# Patient Record
Sex: Female | Born: 1935 | Race: White | Hispanic: No | Smoking: Former smoker
Health system: Southern US, Community
[De-identification: ages and names within clinical notes are randomized; demographics above are authoritative.]

## PROBLEM LIST (undated history)

## (undated) DIAGNOSIS — I10 Essential (primary) hypertension: Secondary | ICD-10-CM

## (undated) DIAGNOSIS — R7303 Prediabetes: Secondary | ICD-10-CM

## (undated) DIAGNOSIS — Z8619 Personal history of other infectious and parasitic diseases: Secondary | ICD-10-CM

## (undated) DIAGNOSIS — I639 Cerebral infarction, unspecified: Secondary | ICD-10-CM

## (undated) DIAGNOSIS — E785 Hyperlipidemia, unspecified: Secondary | ICD-10-CM

## (undated) DIAGNOSIS — M1990 Unspecified osteoarthritis, unspecified site: Secondary | ICD-10-CM

## (undated) DIAGNOSIS — I4891 Unspecified atrial fibrillation: Secondary | ICD-10-CM

## (undated) DIAGNOSIS — I499 Cardiac arrhythmia, unspecified: Secondary | ICD-10-CM

## (undated) DIAGNOSIS — C801 Malignant (primary) neoplasm, unspecified: Secondary | ICD-10-CM

## (undated) HISTORY — DX: Hyperlipidemia, unspecified: E78.5

## (undated) HISTORY — PX: CATARACT EXTRACTION: SUR2

## (undated) HISTORY — DX: Personal history of other infectious and parasitic diseases: Z86.19

## (undated) HISTORY — DX: Unspecified atrial fibrillation: I48.91

## (undated) HISTORY — DX: Essential (primary) hypertension: I10

## (undated) HISTORY — DX: Cerebral infarction, unspecified: I63.9

## (undated) HISTORY — PX: VAGINAL HYSTERECTOMY: SUR661

---

## 1998-02-02 ENCOUNTER — Encounter: Admission: RE | Admit: 1998-02-02 | Discharge: 1998-05-03 | Payer: Self-pay | Admitting: Orthopedic Surgery

## 2004-10-09 ENCOUNTER — Ambulatory Visit: Payer: Self-pay | Admitting: Family Medicine

## 2005-02-24 ENCOUNTER — Encounter: Admission: RE | Admit: 2005-02-24 | Discharge: 2005-04-11 | Payer: Self-pay | Admitting: Podiatry

## 2005-04-27 ENCOUNTER — Emergency Department (HOSPITAL_COMMUNITY): Admission: EM | Admit: 2005-04-27 | Discharge: 2005-04-27 | Payer: Self-pay | Admitting: *Deleted

## 2006-09-25 ENCOUNTER — Ambulatory Visit: Payer: Self-pay

## 2006-09-30 ENCOUNTER — Ambulatory Visit: Payer: Self-pay

## 2006-10-13 ENCOUNTER — Other Ambulatory Visit: Admission: RE | Admit: 2006-10-13 | Discharge: 2006-10-13 | Payer: Self-pay | Admitting: Family Medicine

## 2010-08-13 ENCOUNTER — Inpatient Hospital Stay (HOSPITAL_COMMUNITY): Admission: EM | Admit: 2010-08-13 | Discharge: 2010-08-15 | Payer: Self-pay | Admitting: Emergency Medicine

## 2010-08-13 ENCOUNTER — Encounter (INDEPENDENT_AMBULATORY_CARE_PROVIDER_SITE_OTHER): Payer: Self-pay | Admitting: Cardiovascular Disease

## 2010-08-13 ENCOUNTER — Encounter: Payer: Self-pay | Admitting: Cardiology

## 2010-09-10 DIAGNOSIS — I10 Essential (primary) hypertension: Secondary | ICD-10-CM | POA: Insufficient documentation

## 2010-09-10 DIAGNOSIS — E785 Hyperlipidemia, unspecified: Secondary | ICD-10-CM | POA: Insufficient documentation

## 2010-09-11 ENCOUNTER — Encounter: Payer: Self-pay | Admitting: Cardiology

## 2010-09-11 ENCOUNTER — Ambulatory Visit: Payer: Self-pay | Admitting: Cardiology

## 2010-09-11 DIAGNOSIS — I4891 Unspecified atrial fibrillation: Secondary | ICD-10-CM | POA: Insufficient documentation

## 2010-11-05 NOTE — Assessment & Plan Note (Signed)
Summary: [facility]   Visit Type:  Initial Consult Primary Provider:  Dr. Vernon Prey  CC:  Atrial Fibrillation.  History of Present Illness: The patient presents to establish with our practice. She had no prior cardiac history until last month she was hospitalized with atrial fibrillation and a left ventricular response. She was treated with Cardizem and subsequently converted to sinus rhythm. She did have a mild troponin elevation. Echocardiography demonstrated some evidence of some mild diastolic dysfunction. She had cardiac catheterization demonstrating minimal coronary plaque. I reviewed all of these records.  Since going home she has had no tachycardia palpitations that she felt that date. She has had no resting shortness of breath, PND or orthopnea. She has had no presyncope or syncope. However, she has not been able to do her usual walking. 5 minutes of walking will make her fatigue and short of breath which is unusual. She also may get some slight chest pressure with this. She is having no resting chest pain, neck or arm discomfort. She does not describe nausea vomiting or diaphoresis. She has had no weight gain or edema.  Current Medications (verified): 1)  Aspirin 81 Mg  Tabs (Aspirin) .Marland Kitchen.. 1 By Mouth Daily 2)  Lipitor 80 Mg Tabs (Atorvastatin Calcium) .Marland Kitchen.. 1 By Mouth Daily 3)  Calcium Carbonate   Powd (Calcium Carbonate) .... 2  By Mouth Daily 4)  Vitamin D3 1000 Unit Tabs (Cholecalciferol) .... Two Times A Day 5)  Lovaza 1 Gm Caps (Omega-3-Acid Ethyl Esters) .... 2 By Mouth Daily 6)  Plavix 75 Mg Tabs (Clopidogrel Bisulfate) .Marland Kitchen.. 1 By Mouth Daily 7)  Nitrostat 0.4 Mg Subl (Nitroglycerin) .... As Needed As Directed For Chest Pain 8)  Klor-Con 10 10 Meq Cr-Tabs (Potassium Chloride) .Marland Kitchen.. 1 By Mouth Daily 9)  Digoxin 0.125 Mg Tabs (Digoxin) .... 1/2 By Mouth Daily 10)  Metoprolol Succinate 25 Mg Xr24h-Tab (Metoprolol Succinate) .Marland Kitchen.. 1 By Mouth Daily 11)   Lisinopril-Hydrochlorothiazide 10-12.5 Mg Tabs (Lisinopril-Hydrochlorothiazide) .Marland Kitchen.. 1 By Mouth Daily  Allergies (verified): 1)  ! * Shellfish  Past History:  Past Medical History: Hpertension Hyperlipidemia Osteoporosis Atrial fibrillation  Family History: Father died of a CVA at age 78, mother and sister with CVA at a later age.  Social History: She smoked briefly in the distant past.  She is married with 3 children.  She is retired.  Review of Systems       Fatigue, constipation.  Otherwise as stated and negative for all other systems.  Vital Signs:  Patient profile:   77 year old female Height:      64 inches Weight:      175 pounds BMI:     30.15 Pulse rate:   63 / minute Resp:     16 per minute BP sitting:   116 / 74  (right arm)  Vitals Entered By: Marrion Coy, CNA (September 11, 2010 2:59 PM)  Physical Exam  General:  Well developed, well nourished, in no acute distress. Head:  normocephalic and atraumatic Eyes:  PERRLA/EOM intact; conjunctiva and lids normal. Mouth:  Teeth, gums and palate normal. Oral mucosa normal. Neck:  Neck supple, no JVD. No masses, thyromegaly or abnormal cervical nodes. Chest Wall:  no deformities or breast masses noted Lungs:  Clear bilaterally to auscultation and percussion. Abdomen:  Bowel sounds positive; abdomen soft and non-tender without masses, organomegaly, or hernias noted. No hepatosplenomegaly. Msk:  Back normal, normal gait. Muscle strength and tone normal. Extremities:  No clubbing or cyanosis.  Neurologic:  Alert and oriented x 3. Skin:  Intact without lesions or rashes. Cervical Nodes:  no significant adenopathy Axillary Nodes:  no significant adenopathy Inguinal Nodes:  no significant adenopathy Psych:  Normal affect.   Detailed Cardiovascular Exam  Neck    Carotids: Carotids full and equal bilaterally without bruits.      Neck Veins: Normal, no JVD.    Heart    Inspection: no deformities or lifts noted.        Palpation: normal PMI with no thrills palpable.      Auscultation: regular rate and rhythm, S1, S2 without murmurs, rubs, gallops, or clicks.    Vascular    Abdominal Aorta: no palpable masses, pulsations, or audible bruits.      Femoral Pulses: normal femoral pulses bilaterally.      Pedal Pulses: normal pedal pulses bilaterally.      Radial Pulses: normal radial pulses bilaterally.      Peripheral Circulation: no clubbing, cyanosis, or edema noted with normal capillary refill.     Impression & Recommendations:  Problem # 1:  FIBRILLATION, ATRIAL (ICD-427.31) I reviewed extensively her hospital notes. At this point they agreed with no Coumadin for this isolated event in a patient whose risk is hypertension and female older age.  Because of her fatigue and she doesn't feel as good as she did prior I will give digoxin and Toprol and consolidate her Cardizem to 120 XL daily. She knows to present to the emergency room should she have any further episodes. I also don't see any advantage to Plavix and she can discontinue this.  Problem # 2:  HYPERTENSION (ICD-401.9) Her blood pressure is controlled. She will otherwise continue the meds as listed.  Problem # 3:  HYPERLIPIDEMIA (ICD-272.4) Her primary physician.  Patient Instructions: 1)  Your physician recommends that you schedule a follow-up appointment in: 2 months with Dr Antoine Poche 2)  Your physician recommends that you  lab work today:  BNP 3)  Your physician has recommended you make the following change in your medication: Stop Digoxin and metoprolol. Start Cardizem CD 120mg  once a day Prescriptions: DILTIAZEM HCL CR 120 MG XR12H-CAP (DILTIAZEM HCL) one daily  #30 x 11   Entered by:   Charolotte Capuchin, RN   Authorized by:   Rollene Rotunda, MD, [facility]   Signed by:   Charolotte Capuchin, RN on 09/11/2010   Method used:   Faxed to ...       [facility] (retail)       [address]       [place], [place]  [address]       Ph: [phone] or [phone]       Fax: [phone]   RxID:   [id number]

## 2010-11-13 ENCOUNTER — Ambulatory Visit (INDEPENDENT_AMBULATORY_CARE_PROVIDER_SITE_OTHER): Payer: MEDICARE | Admitting: Cardiology

## 2010-11-13 ENCOUNTER — Encounter: Payer: Self-pay | Admitting: Cardiology

## 2010-11-13 DIAGNOSIS — R0989 Other specified symptoms and signs involving the circulatory and respiratory systems: Secondary | ICD-10-CM

## 2010-11-27 NOTE — Assessment & Plan Note (Signed)
Summary: 2 months 427.31 786.05 pfh,rn/tt   Allergies: 1)  ! * Shellfish

## 2010-12-04 ENCOUNTER — Encounter: Payer: Self-pay | Admitting: Cardiology

## 2010-12-04 ENCOUNTER — Ambulatory Visit (INDEPENDENT_AMBULATORY_CARE_PROVIDER_SITE_OTHER): Payer: MEDICARE | Admitting: Cardiology

## 2010-12-04 DIAGNOSIS — I4891 Unspecified atrial fibrillation: Secondary | ICD-10-CM

## 2010-12-12 NOTE — Assessment & Plan Note (Signed)
Summary: [facility]   Visit Type:  Follow-up Primary Provider:  Dr. Vernon Prey  CC:  Atrial Fibrillation.  History of Present Illness: The patient presents for followup of her atrial fibrillation. Since I last saw her she has had no further tachycardia palpitations. She feels better since being taken off of beta blockers and switch to calcium channel blockers. She has had no presyncope or syncope. She has had no shortness of breath, PND or orthopnea. She has had chest pressure, neck or arm discomfort.  Current Medications (verified): 1)  Aspirin 81 Mg  Tabs (Aspirin) .Marland Kitchen.. 1 By Mouth Daily 2)  Lipitor 80 Mg Tabs (Atorvastatin Calcium) .Marland Kitchen.. 1 By Mouth Daily 3)  Calcium Carbonate   Powd (Calcium Carbonate) .... 2  By Mouth Daily 4)  Vitamin D3 1000 Unit Tabs (Cholecalciferol) .... Two Times A Day 5)  Lovaza 1 Gm Caps (Omega-3-Acid Ethyl Esters) .... 2 By Mouth Daily 6)  Nitrostat 0.4 Mg Subl (Nitroglycerin) .... As Needed As Directed For Chest Pain 7)  Klor-Con 10 10 Meq Cr-Tabs (Potassium Chloride) .Marland Kitchen.. 1 By Mouth Daily 8)  Lisinopril-Hydrochlorothiazide 10-12.5 Mg Tabs (Lisinopril-Hydrochlorothiazide) .Marland Kitchen.. 1 By Mouth Daily 9)  Diltiazem Hcl Cr 120 Mg Xr12h-Cap (Diltiazem Hcl) .... One Daily  Allergies (verified): 1)  ! * Shellfish  Past History:  Past Medical History: Reviewed history from 09/11/2010 and no changes required. Hpertension Hyperlipidemia Osteoporosis Atrial fibrillation  Review of Systems       As stated in the HPI and negative for all other systems.   Vital Signs:  Patient profile:   77 year old female Height:      64 inches Weight:      169 pounds BMI:     29.11 Pulse rate:   66 / minute Resp:     16 per minute BP sitting:   142 / 80  (right arm)  Vitals Entered By: Marrion Coy, CNA (December 04, 2010 1:55 PM)  Physical Exam  General:  Well developed, well nourished, in no acute distress. Head:  normocephalic and atraumatic Neck:  Neck supple, no JVD.  No masses, thyromegaly or abnormal cervical nodes. Chest Wall:  no deformities or breast masses noted Lungs:  Clear bilaterally to auscultation and percussion. Abdomen:  Bowel sounds positive; abdomen soft and non-tender without masses, organomegaly, or hernias noted. No hepatosplenomegaly. Msk:  Back normal, normal gait. Muscle strength and tone normal. Extremities:  No clubbing or cyanosis. Neurologic:  Alert and oriented x 3. Skin:  Intact without lesions or rashes. Cervical Nodes:  no significant adenopathy Psych:  Normal affect.   Detailed Cardiovascular Exam  Neck    Carotids: Carotids full and equal bilaterally without bruits.      Neck Veins: Normal, no JVD.    Heart    Inspection: no deformities or lifts noted.      Palpation: normal PMI with no thrills palpable.      Auscultation: regular rate and rhythm, S1, S2 without murmurs, rubs, gallops, or clicks.    Vascular    Abdominal Aorta: no palpable masses, pulsations, or audible bruits.      Femoral Pulses: normal femoral pulses bilaterally.      Pedal Pulses: normal pedal pulses bilaterally.      Radial Pulses: normal radial pulses bilaterally.      Peripheral Circulation: no clubbing, cyanosis, or edema noted with normal capillary refill.     EKG  Procedure date:  12/04/2010  Findings:      Sinus rhythm, rate 66,  axis within normal limits, intervals within normal limits, no acute ST T-wave changes.  Impression & Recommendations:  Problem # 1:  FIBRILLATION, ATRIAL (ICD-427.31) The patient has had no symptomatic paroxysms. No change in therapy is indicated. Orders: EKG w/ Interpretation (93000)  Problem # 2:  HYPERTENSION (ICD-401.9) Her bp is controlled and she will continue the meds as listed. Orders: EKG w/ Interpretation (93000)  Patient Instructions: 1)  Your physician recommends that you schedule a follow-up appointment in: 12 months with Dr Antoine Poche 2)  Your physician recommends that you continue  on your current medications as directed. Please refer to the Current Medication list given to you today.

## 2010-12-17 LAB — POCT CARDIAC MARKERS
CKMB, poc: 1.5 ng/mL (ref 1.0–8.0)
Myoglobin, poc: 89.1 ng/mL (ref 12–200)
Troponin i, poc: <0.05 ng/mL (ref 0.00–0.09)

## 2010-12-17 LAB — LIPID PANEL
LDL Cholesterol: 66 mg/dL (ref 0–99)
Triglycerides: 92 mg/dL (ref <150)
VLDL: 18 mg/dL (ref 0–40)

## 2010-12-17 LAB — CBC
HCT: 38.5 % (ref 36.0–46.0)
Hemoglobin: 13.3 g/dL (ref 12.0–15.0)
MCHC: 32.5 g/dL (ref 30.0–36.0)
MCV: 92.1 fL (ref 78.0–100.0)
MCV: 93.3 fL (ref 78.0–100.0)
Platelets: 286 10*3/uL (ref 150–400)
Platelets: 316 10*3/uL (ref 150–400)
RBC: 3.89 MIL/uL (ref 3.87–5.11)
RBC: 4.37 MIL/uL (ref 3.87–5.11)
RDW: 13.7 % (ref 11.5–15.5)
WBC: 10.1 10*3/uL (ref 4.0–10.5)
WBC: 9.6 10*3/uL (ref 4.0–10.5)

## 2010-12-17 LAB — CARDIAC PANEL(CRET KIN+CKTOT+MB+TROPI)
Relative Index: INVALID (ref 0.0–2.5)
Total CK: 63 U/L (ref 7–177)
Troponin I: 0.38 ng/mL — ABNORMAL HIGH (ref 0.00–0.06)

## 2010-12-17 LAB — HEPARIN LEVEL (UNFRACTIONATED)
Heparin Unfractionated: 0.4 IU/mL (ref 0.30–0.70)
Heparin Unfractionated: 0.41 IU/mL (ref 0.30–0.70)

## 2010-12-17 LAB — URINE MICROSCOPIC-ADD ON

## 2010-12-17 LAB — URINALYSIS, ROUTINE W REFLEX MICROSCOPIC
Glucose, UA: NEGATIVE mg/dL
Leukocytes, UA: NEGATIVE
pH: 6.5 (ref 5.0–8.0)

## 2010-12-17 LAB — TSH: TSH: 1.596 u[IU]/mL (ref 0.350–4.500)

## 2010-12-17 LAB — CK TOTAL AND CKMB (NOT AT ARMC): Relative Index: INVALID (ref 0.0–2.5)

## 2010-12-17 LAB — PROTIME-INR: INR: 0.92 (ref 0.00–1.49)

## 2010-12-17 LAB — POCT I-STAT, CHEM 8
BUN: 15 mg/dL (ref 6–23)
Chloride: 110 mEq/L (ref 96–112)
Potassium: 3.7 mEq/L (ref 3.5–5.1)
Sodium: 142 mEq/L (ref 135–145)

## 2011-01-02 ENCOUNTER — Other Ambulatory Visit: Payer: Self-pay | Admitting: Cardiology

## 2011-05-19 ENCOUNTER — Encounter: Payer: Self-pay | Admitting: Cardiology

## 2011-06-03 ENCOUNTER — Encounter: Payer: Self-pay | Admitting: Cardiology

## 2011-09-22 ENCOUNTER — Other Ambulatory Visit: Payer: Self-pay | Admitting: Cardiology

## 2011-12-17 ENCOUNTER — Encounter: Payer: Self-pay | Admitting: Cardiology

## 2011-12-17 ENCOUNTER — Ambulatory Visit (INDEPENDENT_AMBULATORY_CARE_PROVIDER_SITE_OTHER): Payer: PRIVATE HEALTH INSURANCE | Admitting: Cardiology

## 2011-12-17 VITALS — BP 120/80 | HR 58 | Ht 64.0 in | Wt 177.0 lb

## 2011-12-17 DIAGNOSIS — I4891 Unspecified atrial fibrillation: Secondary | ICD-10-CM

## 2011-12-17 DIAGNOSIS — I10 Essential (primary) hypertension: Secondary | ICD-10-CM

## 2011-12-17 NOTE — Assessment & Plan Note (Signed)
The blood pressure is at target. No change in medications is indicated. We will continue with therapeutic lifestyle changes (TLC).  

## 2011-12-17 NOTE — Progress Notes (Signed)
   HPI The patient presents for followup of atrial fibrillation. Since I last saw her she has had no further symptomatic arrhythmias.  The patient denies any new symptoms such as chest discomfort, neck or arm discomfort. There has been no new shortness of breath, PND or orthopnea. There have been no reported palpitations, presyncope or syncope.  She walks and vacuums for activity.    No Known Allergies  Current Outpatient Prescriptions  Medication Sig Dispense Refill  . aspirin 81 MG tablet Take 81 mg by mouth daily.        Marland Kitchen atorvastatin (LIPITOR) 80 MG tablet Take 80 mg by mouth daily.        . budesonide-formoterol (SYMBICORT) 80-4.5 MCG/ACT inhaler Inhale 2 puffs into the lungs as needed.      . Calcium Carbonate POWD Take 2 Units by mouth daily.        . Cholecalciferol (VITAMIN D3) 1000 UNITS tablet Take 1,000 Units by mouth 2 (two) times daily.        Marland Kitchen diltiazem (CARDIZEM SR) 120 MG 12 hr capsule TAKE (1) CAPSULE DAILY  30 capsule  1  . diltiazem (DILACOR XR) 120 MG 24 hr capsule Take 120 mg by mouth daily.        Marland Kitchen KLOR-CON 10 10 MEQ CR tablet TAKE 1 TABLET DAILY  30 each  6  . lisinopril-hydrochlorothiazide (PRINZIDE,ZESTORETIC) 10-12.5 MG per tablet Take 1 tablet by mouth daily.        . nitroGLYCERIN (NITROSTAT) 0.4 MG SL tablet Place 0.4 mg under the tongue as needed.        Marland Kitchen omega-3 acid ethyl esters (LOVAZA) 1 G capsule Take 2 g by mouth daily.          Past Medical History  Diagnosis Date  . Hypertension   . Hyperlipidemia   . Osteoporosis   . Atrial fibrillation   . Diabetes mellitus     Diet management    Past Surgical History  Procedure Date  . Vaginal hysterectomy   . Cataract extraction     ROS:  As stated in the HPI and negative for all other systems.  PHYSICAL EXAM BP 120/80  Pulse 58  Ht 5\' 4"  (1.626 m)  Wt 177 lb (80.287 kg)  BMI 30.38 kg/m2 GENERAL:  Well appearing HEENT:  Pupils equal round and reactive, fundi not visualized, oral mucosa  unremarkable NECK:  No jugular venous distention, waveform within normal limits, carotid upstroke brisk and symmetric, no bruits, no thyromegaly LYMPHATICS:  No cervical, inguinal adenopathy LUNGS:  Clear to auscultation bilaterally BACK:  No CVA tenderness CHEST:  Unremarkable HEART:  PMI not displaced or sustained,S1 and S2 within normal limits, no S3, no S4, no clicks, no rubs, no murmurs ABD:  Flat, positive bowel sounds normal in frequency in pitch, no bruits, no rebound, no guarding, no midline pulsatile mass, no hepatomegaly, no splenomegaly EXT:  2 plus pulses throughout, no edema, no cyanosis no clubbing  EKG:  NSR, rate 60  axis within normal limits, intervals within normal limits, no acute ST T wave changes.  12/17/2011  ASSESSMENT AND PLAN

## 2011-12-17 NOTE — Assessment & Plan Note (Signed)
The patient has had no symptomatic recurrence of this. No change in therapy is indicated.

## 2011-12-17 NOTE — Patient Instructions (Signed)
Continue current medications as listed.  Follow as needed

## 2011-12-23 ENCOUNTER — Other Ambulatory Visit: Payer: Self-pay | Admitting: Cardiology

## 2012-12-24 ENCOUNTER — Other Ambulatory Visit (INDEPENDENT_AMBULATORY_CARE_PROVIDER_SITE_OTHER): Payer: Medicare Other

## 2012-12-24 ENCOUNTER — Other Ambulatory Visit: Payer: Self-pay | Admitting: Physician Assistant

## 2012-12-24 DIAGNOSIS — M25561 Pain in right knee: Secondary | ICD-10-CM

## 2012-12-24 DIAGNOSIS — M25569 Pain in unspecified knee: Secondary | ICD-10-CM

## 2012-12-25 ENCOUNTER — Encounter (HOSPITAL_COMMUNITY): Payer: Self-pay | Admitting: *Deleted

## 2012-12-25 ENCOUNTER — Emergency Department (HOSPITAL_COMMUNITY): Payer: PRIVATE HEALTH INSURANCE

## 2012-12-25 ENCOUNTER — Emergency Department (HOSPITAL_COMMUNITY)
Admission: EM | Admit: 2012-12-25 | Discharge: 2012-12-25 | Disposition: A | Discharge status: Home / Self Care | Payer: PRIVATE HEALTH INSURANCE | Attending: Emergency Medicine | Admitting: Emergency Medicine

## 2012-12-25 DIAGNOSIS — Z79899 Other long term (current) drug therapy: Secondary | ICD-10-CM | POA: Insufficient documentation

## 2012-12-25 DIAGNOSIS — I10 Essential (primary) hypertension: Secondary | ICD-10-CM | POA: Insufficient documentation

## 2012-12-25 DIAGNOSIS — E785 Hyperlipidemia, unspecified: Secondary | ICD-10-CM | POA: Insufficient documentation

## 2012-12-25 DIAGNOSIS — Y9301 Activity, walking, marching and hiking: Secondary | ICD-10-CM | POA: Insufficient documentation

## 2012-12-25 DIAGNOSIS — Z7982 Long term (current) use of aspirin: Secondary | ICD-10-CM | POA: Insufficient documentation

## 2012-12-25 DIAGNOSIS — M810 Age-related osteoporosis without current pathological fracture: Secondary | ICD-10-CM | POA: Insufficient documentation

## 2012-12-25 DIAGNOSIS — S52599A Other fractures of lower end of unspecified radius, initial encounter for closed fracture: Secondary | ICD-10-CM | POA: Insufficient documentation

## 2012-12-25 DIAGNOSIS — Y92009 Unspecified place in unspecified non-institutional (private) residence as the place of occurrence of the external cause: Secondary | ICD-10-CM | POA: Insufficient documentation

## 2012-12-25 DIAGNOSIS — W010XXA Fall on same level from slipping, tripping and stumbling without subsequent striking against object, initial encounter: Secondary | ICD-10-CM | POA: Insufficient documentation

## 2012-12-25 DIAGNOSIS — S5291XA Unspecified fracture of right forearm, initial encounter for closed fracture: Secondary | ICD-10-CM

## 2012-12-25 DIAGNOSIS — I4891 Unspecified atrial fibrillation: Secondary | ICD-10-CM | POA: Insufficient documentation

## 2012-12-25 NOTE — ED Provider Notes (Signed)
History    This chart was scribed for Donnetta Hutching, MD, by Frederik Pear, ED scribe. The patient was seen in room APA07/APA07 and the patient's care was started at 2136.    CSN: [id number]  Arrival date & time 12/25/12  2041   First MD Initiated Contact with Patient 12/25/12 2136      Chief Complaint  Patient presents with  . Fall  . Wrist Pain    (Consider location/radiation/quality/duration/timing/severity/associated sxs/prior treatment) The history is provided by the patient and medical records. No language interpreter was used.    Nancy Mccormick is a 79 y.o. female who presents to the Emergency Department complaining of sudden onset, constant, moderate right wrist pain that radiates up to her elbow and began at 2000 when she tripped over a heater while walking in her home and landed on her wrist. She denies any other pain or symptoms. She has an established relationship with [facility] and has previously scheduled appointment for 04/01.  Past Medical History  Diagnosis Date  . Hypertension   . Hyperlipidemia   . Osteoporosis   . Atrial fibrillation     Past Surgical History  Procedure Laterality Date  . Vaginal hysterectomy    . Cataract extraction      Family History  Problem Relation Age of Onset  . Stroke Mother   . Stroke Father 38  . Stroke Sister     History  Substance Use Topics  . Smoking status: Never Smoker   . Smokeless tobacco: Not on file     Comment: Smoke briefly in the distant past  . Alcohol Use: No    OB History   Grav Para Term Preterm Abortions TAB SAB Ect Mult Living                  Review of Systems A complete 10 system review of systems was obtained and all systems are negative except as noted in the HPI and PMH.  Allergies  Review of patient's allergies indicates no known allergies.  Home Medications   Current Outpatient Rx  Name  Route  Sig  Dispense  Refill  . aspirin 81 MG tablet   Oral   Take 81 mg by  mouth daily.           Marland Kitchen atorvastatin (LIPITOR) 80 MG tablet   Oral   Take 80 mg by mouth daily.           . budesonide-formoterol (SYMBICORT) 80-4.5 MCG/ACT inhaler   Inhalation   Inhale 2 puffs into the lungs as needed.         . Calcium Carbonate POWD   Oral   Take 2 Units by mouth daily.           . Cholecalciferol (VITAMIN D3) 1000 UNITS tablet   Oral   Take 1,000 Units by mouth 2 (two) times daily.           Marland Kitchen diltiazem (CARDIZEM SR) 120 MG 12 hr capsule      TAKE (1) CAPSULE DAILY   30 capsule   6   . diltiazem (DILACOR XR) 120 MG 24 hr capsule   Oral   Take 120 mg by mouth daily.           Marland Kitchen KLOR-CON 10 10 MEQ tablet      TAKE 1 TABLET DAILY   30 each   6   . lisinopril-hydrochlorothiazide (PRINZIDE,ZESTORETIC) 10-12.5 MG per tablet   Oral   Take 1 tablet  by mouth daily.           . nitroGLYCERIN (NITROSTAT) 0.4 MG SL tablet   Sublingual   Place 0.4 mg under the tongue as needed.           Marland Kitchen omega-3 acid ethyl esters (LOVAZA) 1 G capsule   Oral   Take 2 g by mouth daily.             BP 187/89  Pulse 78  Temp(Src) 98.1 F (36.7 C) (Oral)  Resp 18  Ht 5\' 4"  (1.626 m)  Wt 180 lb (81.647 kg)  BMI 30.88 kg/m2  SpO2 98%  Physical Exam  Nursing note and vitals reviewed. Constitutional: She is oriented to person, place, and time. She appears well-developed and well-nourished.  HENT:  Head: Normocephalic and atraumatic.  Eyes: Conjunctivae and EOM are normal. Pupils are equal, round, and reactive to light.  Neck: Normal range of motion. Neck supple.  Cardiovascular: Normal rate, regular rhythm and normal heart sounds.   Pulmonary/Chest: Effort normal and breath sounds normal.  Abdominal: Soft. Bowel sounds are normal.  Musculoskeletal: Normal range of motion. She exhibits tenderness.  Tender on dorsal aspect of right wrist with dorsal angulation.   Neurological: She is alert and oriented to person, place, and time.   Neurovascularly intact.  Skin: Skin is warm and dry.  Psychiatric: She has a normal mood and affect.    ED Course  Procedures (including critical care time)  DIAGNOSTIC STUDIES: Oxygen Saturation is 98% on room air, normal by my interpretation.    COORDINATION OF CARE:  21:43- Discussed planned course of treatment with the patient, including pain medication and wrapping the wrist, who is agreeable at this time.  Labs Reviewed - No data to display Dg Wrist Complete Right  12/25/2012  *RADIOLOGY REPORT*  Clinical Data: Fall with wrist pain  RIGHT WRIST - COMPLETE 3+ VIEW  Comparison: None  Findings: A comminuted distal metaphyseal radial fracture is identified with mild apex volar angulation.  The fracture line does not appear to enter the joint space. There is no evidence of subluxation or dislocation. No other fracture is identified. Degenerative changes of the first carpometacarpal joint noted.  IMPRESSION: Comminuted distal radial fracture.   Original Report Authenticated By: Harmon Pier, M.D.    No diagnosis found.  MDM  Right wrist fracture. Neurovascular intact.  Will splint. Orthopedic followup early in the week.  Discussed with patient, husband and daughter I personally performed the services described in this documentation, which was scribed in my presence. The recorded information has been reviewed and is accurate.        Donnetta Hutching, MD 12/25/12 2225

## 2012-12-25 NOTE — ED Notes (Signed)
Discharge instructions given and reviewed with patient and family.  Patient verbalized understanding to follow up with orthopedic of choice next week.  Patient discharged home in good condition via wheelchair.

## 2012-12-25 NOTE — ED Notes (Signed)
Pt fell and c/o right wrist pain.

## 2012-12-27 ENCOUNTER — Other Ambulatory Visit: Payer: Self-pay | Admitting: Orthopaedic Surgery

## 2012-12-27 ENCOUNTER — Encounter (HOSPITAL_BASED_OUTPATIENT_CLINIC_OR_DEPARTMENT_OTHER)
Admission: RE | Admit: 2012-12-27 | Discharge: 2012-12-27 | Disposition: A | Discharge status: Home / Self Care | Payer: PRIVATE HEALTH INSURANCE | Source: Ambulatory Visit | Attending: Orthopaedic Surgery | Admitting: Orthopaedic Surgery

## 2012-12-27 ENCOUNTER — Other Ambulatory Visit: Payer: Self-pay

## 2012-12-27 ENCOUNTER — Encounter (HOSPITAL_BASED_OUTPATIENT_CLINIC_OR_DEPARTMENT_OTHER): Payer: Self-pay | Admitting: *Deleted

## 2012-12-27 ENCOUNTER — Other Ambulatory Visit: Payer: Self-pay | Admitting: Physician Assistant

## 2012-12-27 LAB — BASIC METABOLIC PANEL
BUN: 14 mg/dL (ref 6–23)
CO2: 25 mEq/L (ref 19–32)
Chloride: 104 mEq/L (ref 96–112)
Glucose, Bld: 85 mg/dL (ref 70–99)
Potassium: 4.4 mEq/L (ref 3.5–5.1)

## 2012-12-27 NOTE — Progress Notes (Signed)
Quick Note:  Labs were within normal limits Continue with ortho ______

## 2012-12-27 NOTE — Progress Notes (Signed)
Pt had episode AF 11/11-echo,cath done-converted-ef 65-70% Followed dr hochrein until 3/13 was dc-no problems ekg and bmet done-dr crews looked at her-did not need clearance,

## 2012-12-27 NOTE — Progress Notes (Signed)
Right knee pain and swelling; pt.reurned today to get x-ray; unable to do on date of service [facility] reading (PRIMARY) by  Caryn Bee,                         PA-C  Appears negative

## 2012-12-27 NOTE — H&P (Signed)
Nancy Mccormick is an 79 y.o. female.   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  She suffered a fracture of her distal radius on the right side.  She was seen at the hospital in [facility] and x-rays confirm this.  She was put into a wrist splint and referred to our office.  X-rays reveal a displaced distal radius fracture which is intra-articular right wrist.  We have discussed treatment options that being surgical with a DVR plate to restore her wrist to its proper position and alignment.  I have discussed with her the couple locations associated with this type procedure.  Past Medical History  Diagnosis Date  . Hypertension   . Hyperlipidemia   . Osteoporosis   . Atrial fibrillation   . Arthritis   . Wears glasses     Past Surgical History  Procedure Laterality Date  . Vaginal hysterectomy    . Cataract extraction      Family History  Problem Relation Age of Onset  . Stroke Mother   . Stroke Father 2  . Stroke Sister    Social History:  reports that she has never smoked. She does not have any smokeless tobacco history on file. She reports that she does not drink alcohol or use illicit drugs.  Allergies:  Allergies  Allergen Reactions  . Shellfish Allergy Itching, Swelling and Rash    No prescriptions prior to admission    Results for orders placed during the hospital encounter of 12/28/12 (from the past 48 hour(s))  BASIC METABOLIC PANEL     Status: Abnormal   Collection Time    12/27/12  3:00 PM      Result Value Range   Sodium 142  135 - 145 mEq/L   Potassium 4.4  3.5 - 5.1 mEq/L   Chloride 104  96 - 112 mEq/L   CO2 25  19 - 32 mEq/L   Glucose, Bld 85  70 - 99 mg/dL   BUN 14  6 - 23 mg/dL   Creatinine, Ser 1.61  0.50 - 1.10 mg/dL   Calcium 9.6  8.4 - 09.6 mg/dL   GFR calc non Af Amer 84 (*) >90 mL/min   GFR calc Af Amer >90  >90 mL/min   Comment:           The eGFR has been calculated     using the CKD EPI equation.     This calculation has not been     validated in all clinical     situations.     eGFR's persistently     <90 mL/min signify     possible Chronic Kidney Disease.   Dg Wrist Complete Right  12/25/2012  *RADIOLOGY REPORT*  Clinical Data: Fall with wrist pain  RIGHT WRIST - COMPLETE 3+ VIEW  Comparison: None  Findings: A comminuted distal metaphyseal radial fracture is identified with mild apex volar angulation.  The fracture line does not appear to enter the joint space. There is no evidence of subluxation or dislocation. No other fracture is identified. Degenerative changes of the first carpometacarpal joint noted.  IMPRESSION: Comminuted distal radial fracture.   Original Report Authenticated By: Harmon Pier, M.D.    Dg Knee 1-2 Views Right  12/27/2012  *RADIOLOGY REPORT*  Clinical Data: Pain.  RIGHT KNEE - 1-2 VIEW  Comparison: None.  Findings: Early spurring in the patellofemoral compartment.  No acute bony  abnormality.  Specifically, no fracture, subluxation, or dislocation.  Soft tissues are intact. No joint effusion.  IMPRESSION: No acute bony abnormality.   Original Report Authenticated By: Charlett Nose, M.D.     Review of Systems  Constitutional: Negative.   HENT: Negative.   Eyes: Negative.   Respiratory: Negative.   Cardiovascular: Positive for palpitations.       History of atrial fibrillation  Gastrointestinal: Negative.   Genitourinary: Negative.   Musculoskeletal: Positive for joint pain.  Skin: Negative.   Neurological: Negative.   Endo/Heme/Allergies: Negative.   Psychiatric/Behavioral: Negative.     Height 5\' 4"  (1.626 m), weight 81.647 kg (180 lb). Physical Exam  Constitutional: She is oriented to person, place, and time. She appears well-nourished.  HENT:  Head: Atraumatic.  Eyes: Pupils are equal, round, and reactive to light.  Neck: Normal range of motion.  Cardiovascular: Normal rate and regular  rhythm.   Respiratory: Breath sounds normal.  GI: Bowel sounds are normal.  Musculoskeletal:  Right wrist skin is benign.  Her sensation to her fingertips but they are somewhat stiff.  Good capillary refill to her fingers.  Pain with palpation on the distal radius.  Neurological: She is oriented to person, place, and time.  Skin: Skin is dry.  Psychiatric: She has a normal mood and affect.     Assessment/Plan Assessment: Displaced right distal radius fracture Plan: Briyanna has a displaced distal radius fracture on the right side that is best treated nonoperatively.  We've reviewed the risks of anesthesia, infection and neurovascular injury related to ORIF of distal radius.  She'll followup in our office postoperatively in 7-10 days.  Keandre Linden R 12/27/2012, 6:16 PM

## 2012-12-28 ENCOUNTER — Encounter (HOSPITAL_BASED_OUTPATIENT_CLINIC_OR_DEPARTMENT_OTHER): Payer: Self-pay | Admitting: *Deleted

## 2012-12-28 ENCOUNTER — Ambulatory Visit (HOSPITAL_BASED_OUTPATIENT_CLINIC_OR_DEPARTMENT_OTHER): Payer: PRIVATE HEALTH INSURANCE | Admitting: Anesthesiology

## 2012-12-28 ENCOUNTER — Encounter (HOSPITAL_BASED_OUTPATIENT_CLINIC_OR_DEPARTMENT_OTHER)
Admission: RE | Disposition: A | Discharge status: Home / Self Care | Payer: Self-pay | Source: Ambulatory Visit | Attending: Orthopaedic Surgery

## 2012-12-28 ENCOUNTER — Encounter (HOSPITAL_BASED_OUTPATIENT_CLINIC_OR_DEPARTMENT_OTHER): Payer: Self-pay | Admitting: Anesthesiology

## 2012-12-28 ENCOUNTER — Ambulatory Visit (HOSPITAL_BASED_OUTPATIENT_CLINIC_OR_DEPARTMENT_OTHER)
Admission: RE | Admit: 2012-12-28 | Discharge: 2012-12-28 | Disposition: A | Discharge status: Home / Self Care | Payer: PRIVATE HEALTH INSURANCE | Source: Ambulatory Visit | Attending: Orthopaedic Surgery | Admitting: Orthopaedic Surgery

## 2012-12-28 ENCOUNTER — Other Ambulatory Visit: Payer: Self-pay | Admitting: Cardiology

## 2012-12-28 DIAGNOSIS — S52501A Unspecified fracture of the lower end of right radius, initial encounter for closed fracture: Secondary | ICD-10-CM

## 2012-12-28 DIAGNOSIS — S52539A Colles' fracture of unspecified radius, initial encounter for closed fracture: Secondary | ICD-10-CM | POA: Insufficient documentation

## 2012-12-28 DIAGNOSIS — W19XXXA Unspecified fall, initial encounter: Secondary | ICD-10-CM | POA: Insufficient documentation

## 2012-12-28 DIAGNOSIS — Y92009 Unspecified place in unspecified non-institutional (private) residence as the place of occurrence of the external cause: Secondary | ICD-10-CM | POA: Insufficient documentation

## 2012-12-28 DIAGNOSIS — Z01812 Encounter for preprocedural laboratory examination: Secondary | ICD-10-CM | POA: Insufficient documentation

## 2012-12-28 DIAGNOSIS — M810 Age-related osteoporosis without current pathological fracture: Secondary | ICD-10-CM | POA: Insufficient documentation

## 2012-12-28 DIAGNOSIS — Z01810 Encounter for preprocedural cardiovascular examination: Secondary | ICD-10-CM | POA: Insufficient documentation

## 2012-12-28 DIAGNOSIS — I4891 Unspecified atrial fibrillation: Secondary | ICD-10-CM | POA: Insufficient documentation

## 2012-12-28 DIAGNOSIS — I10 Essential (primary) hypertension: Secondary | ICD-10-CM | POA: Insufficient documentation

## 2012-12-28 HISTORY — DX: Unspecified osteoarthritis, unspecified site: M19.90

## 2012-12-28 HISTORY — PX: OPEN REDUCTION INTERNAL FIXATION (ORIF) DISTAL RADIAL FRACTURE: SHX5989

## 2012-12-28 SURGERY — OPEN REDUCTION INTERNAL FIXATION (ORIF) DISTAL RADIUS FRACTURE
Anesthesia: Regional | Site: Wrist | Laterality: Right | Wound class: Clean

## 2012-12-28 MED ORDER — ONDANSETRON HCL 4 MG/2ML IJ SOLN
INTRAMUSCULAR | Status: DC | PRN
  Administered 2012-12-28: 4 mg via INTRAVENOUS

## 2012-12-28 MED ORDER — LACTATED RINGERS IV SOLN
INTRAVENOUS | Status: DC
  Administered 2012-12-28 (×2): via INTRAVENOUS

## 2012-12-28 MED ORDER — HYDROCODONE-ACETAMINOPHEN 5-325 MG PO TABS: 1.0000 | ORAL_TABLET | ORAL | Status: DC | PRN

## 2012-12-28 MED ORDER — CEFAZOLIN SODIUM-DEXTROSE 2-3 GM-% IV SOLR
2.0000 g | INTRAVENOUS | Status: AC
  Administered 2012-12-28: 2 g via INTRAVENOUS

## 2012-12-28 MED ORDER — BUPIVACAINE-EPINEPHRINE PF 0.5-1:200000 % IJ SOLN
INTRAMUSCULAR | Status: DC | PRN
  Administered 2012-12-28: 25 mL

## 2012-12-28 MED ORDER — HYDROMORPHONE HCL PF 1 MG/ML IJ SOLN: 0.2500 mg | INTRAMUSCULAR | Status: DC | PRN

## 2012-12-28 MED ORDER — FENTANYL CITRATE 0.05 MG/ML IJ SOLN
50.0000 ug | INTRAMUSCULAR | Status: DC | PRN
  Administered 2012-12-28: 100 ug via INTRAVENOUS

## 2012-12-28 MED ORDER — OXYCODONE HCL 5 MG PO TABS: 5.0000 mg | ORAL_TABLET | Freq: Once | ORAL | Status: DC | PRN

## 2012-12-28 MED ORDER — ONDANSETRON HCL 4 MG/2ML IJ SOLN: 4.0000 mg | Freq: Once | INTRAMUSCULAR | Status: DC | PRN

## 2012-12-28 MED ORDER — OXYCODONE HCL 5 MG/5ML PO SOLN: 5.0000 mg | Freq: Once | ORAL | Status: DC | PRN

## 2012-12-28 MED ORDER — EPHEDRINE SULFATE 50 MG/ML IJ SOLN
INTRAMUSCULAR | Status: DC | PRN
  Administered 2012-12-28 (×3): 10 mg via INTRAVENOUS

## 2012-12-28 MED ORDER — 0.9 % SODIUM CHLORIDE (POUR BTL) OPTIME
TOPICAL | Status: DC | PRN
  Administered 2012-12-28: 200 mL

## 2012-12-28 MED ORDER — LIDOCAINE HCL (CARDIAC) 20 MG/ML IV SOLN
INTRAVENOUS | Status: DC | PRN
  Administered 2012-12-28: 80 mg via INTRAVENOUS

## 2012-12-28 MED ORDER — PROPOFOL 10 MG/ML IV BOLUS
INTRAVENOUS | Status: DC | PRN
  Administered 2012-12-28: 100 mg via INTRAVENOUS

## 2012-12-28 MED ORDER — MIDAZOLAM HCL 2 MG/2ML IJ SOLN
1.0000 mg | INTRAMUSCULAR | Status: DC | PRN
  Administered 2012-12-28: 2 mg via INTRAVENOUS

## 2012-12-28 MED ORDER — LACTATED RINGERS IV SOLN: INTRAVENOUS | Status: DC

## 2012-12-28 MED ORDER — DEXAMETHASONE SODIUM PHOSPHATE 10 MG/ML IJ SOLN
INTRAMUSCULAR | Status: DC | PRN
  Administered 2012-12-28: 5 mg via INTRAVENOUS
  Administered 2012-12-28: 8 mg

## 2012-12-28 SURGICAL SUPPLY — 66 items
BANDAGE ELASTIC 3 VELCRO ST LF (GAUZE/BANDAGES/DRESSINGS) ×4 IMPLANT
BANDAGE GAUZE ELAST BULKY 4 IN (GAUZE/BANDAGES/DRESSINGS) ×2 IMPLANT
BIT DRILL 2 FAST STEP (BIT) ×2 IMPLANT
BIT DRILL 2.5X4 QC (BIT) ×2 IMPLANT
BLADE SURG 15 STRL LF DISP TIS (BLADE) ×1 IMPLANT
BLADE SURG 15 STRL SS (BLADE) ×1
BNDG ESMARK 4X9 LF (GAUZE/BANDAGES/DRESSINGS) ×2 IMPLANT
CANISTER SUCTION 1200CC (MISCELLANEOUS) IMPLANT
COVER MAYO STAND STRL (DRAPES) ×2 IMPLANT
COVER TABLE BACK 60X90 (DRAPES) ×2 IMPLANT
CUFF TOURNIQUET SINGLE 18IN (TOURNIQUET CUFF) ×2 IMPLANT
CUFF TOURNIQUET SINGLE 24IN (TOURNIQUET CUFF) IMPLANT
DECANTER SPIKE VIAL GLASS SM (MISCELLANEOUS) IMPLANT
DRAPE EXTREMITY T 121X128X90 (DRAPE) ×2 IMPLANT
DRAPE OEC MINIVIEW 54X84 (DRAPES) ×2 IMPLANT
DRAPE SURG 17X23 STRL (DRAPES) ×2 IMPLANT
DRAPE U-SHAPE 47X51 STRL (DRAPES) IMPLANT
DRSG EMULSION OIL 3X3 NADH (GAUZE/BANDAGES/DRESSINGS) ×2 IMPLANT
DURAPREP 26ML APPLICATOR (WOUND CARE) ×2 IMPLANT
ELECT NEEDLE TIP 2.8 STRL (NEEDLE) IMPLANT
ELECT REM PT RETURN 9FT ADLT (ELECTROSURGICAL)
ELECTRODE REM PT RTRN 9FT ADLT (ELECTROSURGICAL) IMPLANT
GLOVE BIO SURGEON STRL SZ 6.5 (GLOVE) ×4 IMPLANT
GLOVE BIO SURGEON STRL SZ8.5 (GLOVE) ×2 IMPLANT
GLOVE BIOGEL PI IND STRL 8 (GLOVE) ×1 IMPLANT
GLOVE BIOGEL PI IND STRL 8.5 (GLOVE) ×1 IMPLANT
GLOVE BIOGEL PI INDICATOR 8 (GLOVE) ×1
GLOVE BIOGEL PI INDICATOR 8.5 (GLOVE) ×1
GLOVE SS BIOGEL STRL SZ 8 (GLOVE) ×1 IMPLANT
GLOVE SUPERSENSE BIOGEL SZ 8 (GLOVE) ×1
GOWN PREVENTION PLUS XLARGE (GOWN DISPOSABLE) ×2 IMPLANT
GOWN PREVENTION PLUS XXLARGE (GOWN DISPOSABLE) ×4 IMPLANT
NEEDLE HYPO 25X1 1.5 SAFETY (NEEDLE) IMPLANT
NS IRRIG 1000ML POUR BTL (IV SOLUTION) ×2 IMPLANT
PACK BASIN DAY SURGERY FS (CUSTOM PROCEDURE TRAY) ×2 IMPLANT
PAD CAST 3X4 CTTN HI CHSV (CAST SUPPLIES) ×1 IMPLANT
PADDING CAST ABS 4INX4YD NS (CAST SUPPLIES) ×1
PADDING CAST ABS COTTON 4X4 ST (CAST SUPPLIES) ×1 IMPLANT
PADDING CAST COTTON 3X4 STRL (CAST SUPPLIES) ×1
PEG SUBCHONDRAL SMOOTH 2.0X16 (Peg) ×2 IMPLANT
PEG SUBCHONDRAL SMOOTH 2.0X20 (Peg) ×6 IMPLANT
PEG SUBCHONDRAL SMOOTH 2.0X22 (Peg) ×6 IMPLANT
PENCIL BUTTON HOLSTER BLD 10FT (ELECTRODE) ×2 IMPLANT
PLATE STAN 24.4X59.5 RT (Plate) ×2 IMPLANT
SCREW BN 12X3.5XNS CORT TI (Screw) ×2 IMPLANT
SCREW CORT 3.5X10 LNG (Screw) ×2 IMPLANT
SCREW CORT 3.5X12 (Screw) ×2 IMPLANT
SLEEVE SCD COMPRESS KNEE MED (MISCELLANEOUS) ×2 IMPLANT
SPLINT PLASTER CAST XFAST 3X15 (CAST SUPPLIES) ×5 IMPLANT
SPLINT PLASTER XTRA FASTSET 3X (CAST SUPPLIES) ×5
SPONGE GAUZE 4X4 12PLY (GAUZE/BANDAGES/DRESSINGS) ×2 IMPLANT
STOCKINETTE 4X48 STRL (DRAPES) ×2 IMPLANT
STRIP CLOSURE SKIN 1/4X4 (GAUZE/BANDAGES/DRESSINGS) ×2 IMPLANT
SUCTION FRAZIER TIP 10 FR DISP (SUCTIONS) IMPLANT
SUT ETHILON 4 0 PS 2 18 (SUTURE) ×2 IMPLANT
SUT VIC AB 2-0 PS2 27 (SUTURE) IMPLANT
SUT VIC AB 2-0 SH 27 (SUTURE) ×1
SUT VIC AB 2-0 SH 27XBRD (SUTURE) ×1 IMPLANT
SUT VIC AB 3-0 PS1 18 (SUTURE)
SUT VIC AB 3-0 PS1 18XBRD (SUTURE) IMPLANT
SYR BULB 3OZ (MISCELLANEOUS) ×2 IMPLANT
SYR CONTROL 10ML LL (SYRINGE) IMPLANT
TOWEL OR 17X24 6PK STRL BLUE (TOWEL DISPOSABLE) ×4 IMPLANT
TOWEL OR NON WOVEN STRL DISP B (DISPOSABLE) ×2 IMPLANT
TUBE CONNECTING 20X1/4 (TUBING) IMPLANT
UNDERPAD 30X30 INCONTINENT (UNDERPADS AND DIAPERS) ×2 IMPLANT

## 2012-12-28 NOTE — Anesthesia Preprocedure Evaluation (Addendum)

## 2012-12-28 NOTE — Anesthesia Procedure Notes (Addendum)
Anesthesia Regional Block:  Supraclavicular block  Pre-Anesthetic Checklist: ,, timeout performed, Correct Patient, Correct Site, Correct Laterality, Correct Procedure, Correct Position, site marked, Risks and benefits discussed,  Surgical consent,  Pre-op evaluation,  At surgeon's request and post-op pain management  Laterality: Right and Upper  Prep: chloraprep       Needles:   Needle Type: Echogenic Needle     Needle Length: 5cm 5 cm Needle Gauge: 21    Additional Needles:  Procedures: ultrasound guided (picture in chart) Supraclavicular block Narrative:  Start time: 12/28/2012 11:52 AM End time: 12/28/2012 12:02 PM Injection made incrementally with aspirations every 5 mL.  Performed by: Personally  Anesthesiologist: Sheldon Silvan, MD  Interscalene brachial plexus block Procedure Name: LMA Insertion Date/Time: 12/28/2012 1:40 PM Performed by: Burna Cash Pre-anesthesia Checklist: Patient identified, Emergency Drugs available, Suction available and Patient being monitored Patient Re-evaluated:Patient Re-evaluated prior to inductionOxygen Delivery Method: Circle System Utilized Preoxygenation: Pre-oxygenation with 100% oxygen Intubation Type: IV induction Ventilation: Mask ventilation without difficulty LMA: LMA inserted LMA Size: 4.0 Number of attempts: 1 Airway Equipment and Method: bite block Placement Confirmation: positive ETCO2 Tube secured with: Tape Dental Injury: Teeth and Oropharynx as per pre-operative assessment

## 2012-12-28 NOTE — Progress Notes (Signed)
Pt given IS with instructions for use at home.  Pt practiced and showed proper usage.  Verbalized understanding of device.

## 2012-12-28 NOTE — Op Note (Signed)
#  [id number] 

## 2012-12-28 NOTE — Interval H&P Note (Signed)
History and Physical Interval Note:  12/28/2012 1:05 PM  Nancy Mccormick  has presented today for surgery, with the diagnosis of RIGHT DISTAL RADIUS FRACTURE  The various methods of treatment have been discussed with the patient and family. After consideration of risks, benefits and other options for treatment, the patient has consented to  Procedure(s): OPEN REDUCTION INTERNAL FIXATION (ORIF) DISTAL RADIAL FRACTURE (Right) as a surgical intervention .  The patient's history has been reviewed, patient examined, no change in status, stable for surgery.  I have reviewed the patient's chart and labs.  Questions were answered to the patient's satisfaction.     Edilia Ghuman G

## 2012-12-28 NOTE — Transfer of Care (Signed)
Immediate Anesthesia Transfer of Care Note  Patient: Nancy Mccormick  Procedure(s) Performed: Procedure(s): OPEN REDUCTION INTERNAL FIXATION (ORIF) DISTAL RADIAL FRACTURE (Right)  Patient Location: PACU  Anesthesia Type:GA combined with regional for post-op pain  Level of Consciousness: sedated  Airway & Oxygen Therapy: Patient Spontanous Breathing and Patient connected to face mask oxygen  Post-op Assessment: Report given to PACU RN and Post -op Vital signs reviewed and stable  Post vital signs: Reviewed  Complications: No apparent anesthesia complications

## 2012-12-28 NOTE — Progress Notes (Signed)
Assisted Dr. Crews with right, ultrasound guided, interscalene  block. Side rails up, monitors on throughout procedure. See vital signs in flow sheet. Tolerated Procedure well. 

## 2012-12-28 NOTE — Anesthesia Postprocedure Evaluation (Signed)
  Anesthesia Post-op Note  Patient: Nancy Mccormick  Procedure(s) Performed: Procedure(s): OPEN REDUCTION INTERNAL FIXATION (ORIF) DISTAL RADIAL FRACTURE (Right)  Patient Location: PACU  Anesthesia Type:GA combined with regional for post-op pain  Level of Consciousness: awake, alert  and oriented  Airway and Oxygen Therapy: Patient Spontanous Breathing and Patient connected to face mask oxygen  Post-op Pain: none  Post-op Assessment: Post-op Vital signs reviewed  Post-op Vital Signs: Reviewed  Complications: No apparent anesthesia complications

## 2012-12-29 NOTE — Op Note (Signed)
NAMEPAULETT, KAUFHOLD NO.:  [id number]  MEDICAL RECORD NO.:  [id number]  LOCATION:  [facility]                         FACILITY:  [facility]  PHYSICIAN:  Lubertha Basque. Smith Potenza, M.D.DATE OF BIRTH:  [birth year]  DATE OF PROCEDURE:  12/28/2012 DATE OF DISCHARGE:  12/25/2012                              OPERATIVE REPORT   PREOPERATIVE DIAGNOSIS:  Right distal radius fracture.  POSTOPERATIVE DIAGNOSIS:  Right distal radius fracture.  PROCEDURE:  Open reduction and internal fixation, right distal radius fracture.  ANESTHESIA:  General and block.  ATTENDING SURGEON:  Lubertha Basque. Jerl Santos, M.D.  ASSISTANT:  Lindwood Qua, PA-C  INDICATION FOR PROCEDURE:  The patient is a 79 year old woman who fell a few days ago and suffered a displaced intra-articular right distal radius fracture.  She was seen in the office yesterday and offered closed versus open management.  She has a fairly high demand arm and that she has to care for her husband and drive, and I felt that operative intervention would probably lead to a quicker recovery, which was very important in her case.  Informed operative consent was obtained after discussion of possible complications including reaction to anesthesia, infection, and neurovascular injury.  SUMMARY OF FINDINGS AND PROCEDURE:  Under general anesthesia and a block through a volar approach, a right distal radius fracture was exposed, and reduced, and stabilized with a standard right DVR plate by hand innovations.  There was one intra-articular split which I reduced anatomically.  I used fluoroscopy throughout the case to make appropriate intraoperative decisions and read all of these views myself. Lindwood Qua assisted throughout and was invaluable to the completion of the case and that he maintained exposure and reduction while I performed the operation.  The patient was discharged home the same day.  DESCRIPTION OF PROCEDURE:  The patient was  taken to the operating suite, where general anesthetic was applied without difficulty.  She was also given a block in the pre-anesthesia area.  She was positioned supine and prepped and draped in normal sterile fashion.  After the administration with IV Kefzol and an appropriate time out, the right arm was elevated, exsanguinated, and the tourniquet inflated about the upper arm.  We made a volar incision along the course of the FCR tendon with dissection down to the structure.  We took the tendon out of harm's way and took the short muscles off the distal radius with blunt dissection.  The fracture was visualized.  This was then reduced under direct visualization including the 1 split into the joint.  We then placed a DVR plate by hand innovations using all the distal screws with smooth pegs and 3 of the proximal screw holes with standard screws.  We did use finger trap traction during this portion of the case.  The wound was irrigated.  We took some fluoroscopic views which showed good placement of hardware and reduction of the fracture in 2 planes.  We then reapproximated the subcutaneous tissues with 2-0 undyed Vicryl.  The skin became pink and warm immediately when the tourniquet was deflated.  A mild amount of bleeding was easily controlled with some pressure.  We then reapproximated the skin with vertical mattresses of  nylon.  Adaptic was applied, followed by dry gauze and a volar splint of plaster with the wrist in slight extension.  Estimated blood loss, intraoperative fluids, as well as accurate tourniquet time was obtained from anesthesia records.  DISPOSITION:  The patient was extubated in the operating room and taken to recovery room in stable condition.  She was to go home the same day and follow up in the office closely.  I will contact her by phone tonight.     Lubertha Basque Jerl Santos, M.D.     PGD/MEDQ  D:  12/28/2012  T:  12/29/2012  Job:  [id number]

## 2012-12-30 ENCOUNTER — Encounter (HOSPITAL_BASED_OUTPATIENT_CLINIC_OR_DEPARTMENT_OTHER): Payer: Self-pay | Admitting: Orthopaedic Surgery

## 2013-02-10 ENCOUNTER — Other Ambulatory Visit: Payer: Self-pay | Admitting: Nurse Practitioner

## 2013-03-31 ENCOUNTER — Other Ambulatory Visit (INDEPENDENT_AMBULATORY_CARE_PROVIDER_SITE_OTHER): Payer: Medicare Other

## 2013-03-31 DIAGNOSIS — E785 Hyperlipidemia, unspecified: Secondary | ICD-10-CM

## 2013-03-31 DIAGNOSIS — I10 Essential (primary) hypertension: Secondary | ICD-10-CM

## 2013-03-31 DIAGNOSIS — E559 Vitamin D deficiency, unspecified: Secondary | ICD-10-CM

## 2013-03-31 LAB — POCT CBC
Granulocyte percent: 67.8 %G (ref 37–80)
Lymph, poc: 2.4 (ref 0.6–3.4)
MCHC: 36 g/dL — AB (ref 31.8–35.4)
POC Granulocyte: 5.8 (ref 2–6.9)
Platelet Count, POC: 349 10*3/uL (ref 142–424)
RDW, POC: 13.4 %
WBC: 8.6 10*3/uL (ref 4.6–10.2)

## 2013-03-31 LAB — BASIC METABOLIC PANEL WITH GFR
BUN: 19 mg/dL (ref 6–23)
Calcium: 9.6 mg/dL (ref 8.4–10.5)
Creat: 0.81 mg/dL (ref 0.50–1.10)
GFR, Est African American: 82 mL/min
GFR, Est Non African American: 71 mL/min
Glucose, Bld: 106 mg/dL — ABNORMAL HIGH (ref 70–99)
Potassium: 4 mEq/L (ref 3.5–5.3)

## 2013-03-31 LAB — HEPATIC FUNCTION PANEL
ALT: 18 U/L (ref 0–35)
Albumin: 4 g/dL (ref 3.5–5.2)
Alkaline Phosphatase: 84 U/L (ref 39–117)
Total Protein: 6.3 g/dL (ref 6.0–8.3)

## 2013-04-01 LAB — NMR LIPOPROFILE WITH LIPIDS
HDL Particle Number: 37.7 umol/L (ref >=30.5)
LDL (calc): 56 mg/dL (ref <100)
LDL Particle Number: 702 nmol/L (ref <1000)
LDL Size: 20.5 nm — ABNORMAL LOW (ref >20.5)
LP-IR Score: <25 (ref <=45)
Large VLDL-P: <0.8 nmol/L (ref <=2.7)
Small LDL Particle Number: 262 nmol/L (ref <=527)
VLDL Size: 43.1 nm (ref <=46.6)

## 2013-04-04 ENCOUNTER — Encounter: Payer: Self-pay | Admitting: Family Medicine

## 2013-04-04 ENCOUNTER — Ambulatory Visit (INDEPENDENT_AMBULATORY_CARE_PROVIDER_SITE_OTHER): Payer: Medicare Other | Admitting: Family Medicine

## 2013-04-04 VITALS — BP 107/67 | HR 69 | Temp 97.6°F | Ht 64.0 in | Wt 180.4 lb

## 2013-04-04 DIAGNOSIS — I4891 Unspecified atrial fibrillation: Secondary | ICD-10-CM

## 2013-04-04 DIAGNOSIS — I10 Essential (primary) hypertension: Secondary | ICD-10-CM

## 2013-04-04 DIAGNOSIS — E785 Hyperlipidemia, unspecified: Secondary | ICD-10-CM

## 2013-04-04 NOTE — Patient Instructions (Addendum)
Fall precautions discussed Continue current meds and therapeutic lifestyle changes Debrox will be helpful for ear cerumen. Use 2-3 drops nightly for 2-3 nights in each ear. Weight one week and repeat.

## 2013-04-04 NOTE — Progress Notes (Signed)
  Subjective:    Patient ID: Nancy Mccormick, female    DOB: December 23, 1933, 79 y.o.   MRN: [id number]  HPI Patient returns today for followup of chronic medical problems which include hypertension hyperlipidemia and generalized arthralgias. She has a history of colon polyps, elevated blood sugar, and atrial fibrillation. Currently she is controlling her diabetes her increased blood sugar with diet alone. See also the review of systems. She is up-to-date on her health maintenance. Patient has a  recent fracture of the right distal forearm involving the radius and ulna. She is doing well with this. She said she tripped accidentally and fell on her wrist.  Review of Systems  Constitutional: Positive for fatigue.  HENT: Positive for postnasal drip.   Eyes: Negative.   Respiratory: Negative.   Cardiovascular: Negative.   Gastrointestinal: Negative.   Endocrine: Negative.   Genitourinary: Negative.   Musculoskeletal: Positive for back pain (LBP due to arthritis) and arthralgias (R knee, arthritis).  Skin: Negative.   Allergic/Immunologic: Positive for environmental allergies (seasonal).  Neurological: Positive for numbness (bilateral hands due to neuropathy).  Psychiatric/Behavioral: Positive for sleep disturbance (occasional 2-3 x week).       Objective:   Physical Exam BP 107/67  Pulse 69  Temp(Src) 97.6 F (36.4 C) (Oral)  Ht 5\' 4"  (1.626 m)  Wt 180 lb 6.4 oz (81.829 kg)  BMI 30.95 kg/m2  The patient appeared well nourished and normally developed, alert and oriented to time and place. Speech, behavior and judgement appear normal. Vital signs as documented.  Head exam is unremarkable. No scleral icterus or pallor noted. Bilateral ear cerumen. Nasal pallor bilaterally  Neck is without jugular venous distension, thyromegally, or carotid bruits. Carotid upstrokes are brisk bilaterally. No cervical adenopathy. Lungs are clear anteriorly and posteriorly to auscultation. Normal respiratory  effort. Cardiac exam reveals regular rate and rhythm at 72 per minute. First and second heart sounds normal.  No murmurs, rubs or gallops.  Abdominal exam reveals normal bowl sounds, no masses, no organomegaly and no aortic enlargement. No inguinal adenopathy. Extremities are nonedematous and both femoral and pedal pulses are normal. Right distal forearm fracture appears to be healing well. Patient has good mobility. Skin without pallor or jaundice.  Warm and dry, without rash. Neurologic exam reveals normal deep tendon reflexes and normal sensation. Sensation in feet was definitely normal bilateral   Labs were reviewed with patient.       Assessment & Plan:  1. HYPERTENSION  2. HYPERLIPIDEMIA  3. FIBRILLATION, ATRIAL  Patient Instructions  Fall precautions discussed Continue current meds and therapeutic lifestyle changes Debrox will be helpful for ear cerumen. Use 2-3 drops nightly for 2-3 nights in each ear. Weight one week and repeat.

## 2013-08-01 ENCOUNTER — Telehealth: Payer: Self-pay | Admitting: Family Medicine

## 2013-08-04 NOTE — Telephone Encounter (Signed)
Try trazodone 50 mg one at bedtime as needed #30 refill as needed

## 2013-08-04 NOTE — Telephone Encounter (Signed)
[facility] -= please address 

## 2013-08-08 MED ORDER — TRAZODONE HCL 50 MG PO TABS: 50.0000 mg | ORAL_TABLET | Freq: Every day | ORAL | Status: DC

## 2013-08-25 ENCOUNTER — Telehealth: Payer: Self-pay | Admitting: Family Medicine

## 2013-08-25 NOTE — Telephone Encounter (Signed)
Spoke with patient and she cancelled the appt on dec 3 due to husband getting ready to have surgery and will call back on the 3rd to reschedule once she knows more about the schedule.

## 2013-09-07 ENCOUNTER — Ambulatory Visit: Payer: Medicare Other | Admitting: Family Medicine

## 2013-11-04 ENCOUNTER — Ambulatory Visit (INDEPENDENT_AMBULATORY_CARE_PROVIDER_SITE_OTHER): Payer: Medicare Other | Admitting: Family Medicine

## 2013-11-04 ENCOUNTER — Encounter: Payer: Self-pay | Admitting: Family Medicine

## 2013-11-04 ENCOUNTER — Encounter (INDEPENDENT_AMBULATORY_CARE_PROVIDER_SITE_OTHER): Payer: Self-pay

## 2013-11-04 VITALS — BP 114/71 | HR 60 | Temp 97.8°F | Wt 166.8 lb

## 2013-11-04 DIAGNOSIS — R002 Palpitations: Secondary | ICD-10-CM

## 2013-11-04 DIAGNOSIS — I10 Essential (primary) hypertension: Secondary | ICD-10-CM

## 2013-11-04 DIAGNOSIS — E785 Hyperlipidemia, unspecified: Secondary | ICD-10-CM

## 2013-11-04 DIAGNOSIS — I4891 Unspecified atrial fibrillation: Secondary | ICD-10-CM

## 2013-11-04 DIAGNOSIS — I499 Cardiac arrhythmia, unspecified: Secondary | ICD-10-CM

## 2013-11-04 LAB — POCT CBC
Granulocyte percent: 62.8 %G (ref 37–80)
HCT, POC: 47.2 % (ref 37.7–47.9)
Hemoglobin: 14.7 g/dL (ref 12.2–16.2)
Lymph, poc: 2.5 (ref 0.6–3.4)
MCH, POC: 28.5 pg (ref 27–31.2)
MCHC: 31.2 g/dL — AB (ref 31.8–35.4)
MCV: 91.4 fL (ref 80–97)
MPV: 8.6 fL (ref 0–99.8)
POC Granulocyte: 4.6 (ref 2–6.9)
POC LYMPH PERCENT: 33.9 %L (ref 10–50)
Platelet Count, POC: 285 10*3/uL (ref 142–424)
RBC: 5.2 M/uL (ref 4.04–5.48)
RDW, POC: 13.5 %
WBC: 7.4 10*3/uL (ref 4.6–10.2)

## 2013-11-04 NOTE — Progress Notes (Signed)
Patient ID: Nancy Mccormick, female   DOB: June 10, 1934, 80 y.o.   MRN: [id number] SUBJECTIVE: CC: Chief Complaint  Patient presents with  . Irregular Heart Beat    last PM, h/o afib    HPI: Last night for 1.5 hours had irregular heart beat and rapid and felt weak and dizzy. Considered calling 911 but it stopped eventually and felt better. Has had an episode of atrial fibrillation in the past and was given medications and she converted. Has been fine since.  Past Medical History  Diagnosis Date  . Hypertension   . Hyperlipidemia   . Osteoporosis   . Atrial fibrillation   . Arthritis   . Wears glasses    Past Surgical History  Procedure Laterality Date  . Vaginal hysterectomy    . Cataract extraction    . Open reduction internal fixation (orif) distal radial fracture Right 12/28/2012    Procedure: OPEN REDUCTION INTERNAL FIXATION (ORIF) DISTAL RADIAL FRACTURE;  Surgeon: Hessie Dibble, MD;  Location: [place];  Service: Orthopedics;  Laterality: Right;   History   Social History  . Marital Status: Married    Spouse Name: N/A    Number of Children: N/A  . Years of Education: N/A   Occupational History  . Retired    Social History Main Topics  . Smoking status: Former Smoker    Quit date: 12/17/1986  . Smokeless tobacco: Not on file     Comment: Smoke briefly in the distant past  . Alcohol Use: No  . Drug Use: No  . Sexual Activity: Not on file   Other Topics Concern  . Not on file   Social History Narrative   Married with 3 children   Family History  Problem Relation Age of Onset  . Stroke Mother   . Stroke Father 39  . Stroke Sister    Current Outpatient Prescriptions on File Prior to Visit  Medication Sig Dispense Refill  . aspirin 81 MG tablet Take 81 mg by mouth at bedtime.       . Calcium Carbonate-Vitamin D (CALCIUM + D PO) Take 1 tablet by mouth at bedtime.      . Cholecalciferol (VITAMIN D3) 1000 UNITS tablet Take 3,000 Units by  mouth at bedtime.       Marland Kitchen diltiazem (DILACOR XR) 120 MG 24 hr capsule Take 120 mg by mouth at bedtime.       Marland Kitchen lisinopril-hydrochlorothiazide (PRINZIDE,ZESTORETIC) 10-12.5 MG per tablet Take 1 tablet by mouth at bedtime.       Marland Kitchen LOVAZA 1 G capsule TAKE  (1)  CAPSULE  TWICE DAILY.  60 capsule  5  . nitroGLYCERIN (NITROSTAT) 0.4 MG SL tablet Place 0.4 mg under the tongue as needed.        . potassium chloride (K-DUR) 10 MEQ tablet TAKE 1 TABLET DAILY  30 tablet  5  . traZODone (DESYREL) 50 MG tablet Take 1 tablet (50 mg total) by mouth at bedtime.  30 tablet  3   No current facility-administered medications on file prior to visit.   Allergies  Allergen Reactions  . Shellfish Allergy Itching, Swelling and Rash    There is no immunization history on file for this patient. Prior to Admission medications   Medication Sig Start Date End Date Taking? Authorizing Provider  aspirin 81 MG tablet Take 81 mg by mouth at bedtime.    Yes Historical Provider, MD  Calcium Carbonate-Vitamin D (CALCIUM + D PO) Take 1 tablet by  mouth at bedtime.   Yes Historical Provider, MD  Cholecalciferol (VITAMIN D3) 1000 UNITS tablet Take 3,000 Units by mouth at bedtime.    Yes Historical Provider, MD  diltiazem (DILACOR XR) 120 MG 24 hr capsule Take 120 mg by mouth at bedtime.    Yes Historical Provider, MD  lisinopril-hydrochlorothiazide (PRINZIDE,ZESTORETIC) 10-12.5 MG per tablet Take 1 tablet by mouth at bedtime.    Yes Historical Provider, MD  LOVAZA 1 G capsule TAKE  (1)  CAPSULE  TWICE DAILY. 02/10/13  Yes Chipper Herb, MD  nitroGLYCERIN (NITROSTAT) 0.4 MG SL tablet Place 0.4 mg under the tongue as needed.     Yes Historical Provider, MD  potassium chloride (K-DUR) 10 MEQ tablet TAKE 1 TABLET DAILY 12/28/12  Yes Minus Breeding, MD  traZODone (DESYREL) 50 MG tablet Take 1 tablet (50 mg total) by mouth at bedtime. 08/08/13  Yes Chipper Herb, MD     ROS: As above in the HPI. All other systems are stable or  negative.  OBJECTIVE: APPEARANCE:  Patient in no acute distress.The patient appeared well nourished and normally developed. Acyanotic. Waist: VITAL SIGNS:BP 114/71  Pulse 60  Temp(Src) 97.8 F (36.6 C) (Oral)  Wt 166 lb 12.8 oz (75.66 kg) Elderly WF  SKIN: warm and  Dry without overt rashes, tattoos and scars  HEAD and Neck: without JVD, Head and scalp: normal Eyes:No scleral icterus. Fundi normal, eye movements normal. Ears: Auricle normal, canal normal, Tympanic membranes normal, insufflation normal. Nose: normal Throat: normal Neck & thyroid: normal  CHEST & LUNGS: Chest wall: normal Lungs: Clear  CVS: Reveals the PMI to be normally located. Regular rhythm, First and Second Heart sounds are normal,  absence of murmurs, rubs or gallops. Peripheral vasculature: Radial pulses: normal Dorsal pedis pulses: normal Posterior pulses: normal  ABDOMEN:  Appearance: normal Benign, no organomegaly, no masses, no Abdominal Aortic enlargement. No Guarding , no rebound. No Bruits. Bowel sounds: normal  RECTAL: N/A GU: N/A  EXTREMETIES: nonedematous.  MUSCULOSKELETAL:  Spine: normal Joints: intact  NEUROLOGIC: oriented to time,place and person; nonfocal. Strength is normal Sensory is normal Reflexes are normal Cranial Nerves are normal.  ASSESSMENT:  Arrhythmia - Plan: EKG 12-Lead, CMP14+EGFR, TSH, Ambulatory referral to Cardiology, Holter monitor - 24 hour, CANCELED: Holter monitor - 48 hour  Palpitations - Plan: POCT CBC, Ambulatory referral to Cardiology, Holter monitor - 24 hour, CANCELED: Holter monitor - 48 hour  HYPERTENSION  HYPERLIPIDEMIA  FIBRILLATION, ATRIAL - Plan: Ambulatory referral to Cardiology, Holter monitor - 24 hour Suspect that she had an episode of atrial fibrillation which spontaniously converted.  PLAN:      Dr Paula Libra Recommendations  For nutrition information, I recommend books:  1).Eat to Live by Dr Excell Seltzer. 2).Prevent and Reverse Heart Disease by Dr Karl Luke. 3) Dr Janene Harvey Book:  Program to Reverse Diabetes  Exercise recommendations are:  If unable to walk, then the patient can exercise in a chair 3 times a day. By flapping arms like a bird gently and raising legs outwards to the front.  If ambulatory, the patient can go for walks for 30 minutes 3 times a week. Then increase the intensity and duration as tolerated.  Goal is to try to attain exercise frequency to 5 times a week.  If applicable: Best to perform resistance exercises (machines or weights) 2 days a week and cardio type exercises 3 days per week.  Orders Placed This Encounter  Procedures  . CMP14+EGFR  .  TSH  . Ambulatory referral to Cardiology    Referral Priority:  Routine    Referral Type:  Consultation    Referral Reason:  Specialty Services Required    Requested Specialty:  Cardiology    Number of Visits Requested:  1  . POCT CBC  . EKG 12-Lead  . Holter monitor - 24 hour    Standing Status: Future     Number of Occurrences:      Standing Expiration Date: 11/04/2014  EKG: NSR. No acute findings No orders of the defined types were placed in this encounter.   Medications Discontinued During This Encounter  Medication Reason  . atorvastatin (LIPITOR) 80 MG tablet Error  consider anticoagulation. Return for pending work up.. to the Ed or call 911 if it recurs.   Tanush Drees P. Jacelyn Grip, M.D.

## 2013-11-04 NOTE — Patient Instructions (Signed)
      Dr Hurshell Dino's Recommendations  For nutrition information, I recommend books:  1).Eat to Live by Dr Joel Fuhrman. 2).Prevent and Reverse Heart Disease by Dr Caldwell Esselstyn. 3) Dr Neal Barnard's Book:  Program to Reverse Diabetes  Exercise recommendations are:  If unable to walk, then the patient can exercise in a chair 3 times a day. By flapping arms like a bird gently and raising legs outwards to the front.  If ambulatory, the patient can go for walks for 30 minutes 3 times a week. Then increase the intensity and duration as tolerated.  Goal is to try to attain exercise frequency to 5 times a week.  If applicable: Best to perform resistance exercises (machines or weights) 2 days a week and cardio type exercises 3 days per week.  

## 2013-11-05 LAB — CMP14+EGFR
ALT: 16 IU/L (ref 0–32)
AST: 17 IU/L (ref 0–40)
Albumin/Globulin Ratio: 2.6 — ABNORMAL HIGH (ref 1.1–2.5)
Albumin: 4.7 g/dL (ref 3.5–4.8)
Alkaline Phosphatase: 85 IU/L (ref 39–117)
BUN/Creatinine Ratio: 11 (ref 11–26)
BUN: 10 mg/dL (ref 8–27)
CO2: 24 mmol/L (ref 18–29)
Calcium: 10.1 mg/dL (ref 8.7–10.3)
Chloride: 100 mmol/L (ref 97–108)
Creatinine, Ser: 0.89 mg/dL (ref 0.57–1.00)
GFR calc Af Amer: 72 mL/min/{1.73_m2} (ref >59)
GFR calc non Af Amer: 63 mL/min/{1.73_m2} (ref >59)
Globulin, Total: 1.8 g/dL (ref 1.5–4.5)
Glucose: 91 mg/dL (ref 65–99)
Potassium: 4.9 mmol/L (ref 3.5–5.2)
Sodium: 143 mmol/L (ref 134–144)
Total Bilirubin: 0.6 mg/dL (ref 0.0–1.2)
Total Protein: 6.5 g/dL (ref 6.0–8.5)

## 2013-11-05 LAB — TSH: TSH: 1.72 u[IU]/mL (ref 0.450–4.500)

## 2013-11-05 NOTE — Progress Notes (Signed)
Quick Note:  Call patient. Labs normal. No change in plan. ______ 

## 2013-11-08 ENCOUNTER — Encounter: Payer: Self-pay | Admitting: *Deleted

## 2013-11-08 ENCOUNTER — Other Ambulatory Visit: Payer: Self-pay | Admitting: *Deleted

## 2013-11-08 DIAGNOSIS — R002 Palpitations: Secondary | ICD-10-CM

## 2013-11-08 DIAGNOSIS — I499 Cardiac arrhythmia, unspecified: Secondary | ICD-10-CM

## 2013-11-08 DIAGNOSIS — I4891 Unspecified atrial fibrillation: Secondary | ICD-10-CM

## 2013-11-08 NOTE — Progress Notes (Signed)
24 hr holter monitor downloaded and sent to [facility]. 

## 2013-11-09 ENCOUNTER — Ambulatory Visit (INDEPENDENT_AMBULATORY_CARE_PROVIDER_SITE_OTHER): Payer: PRIVATE HEALTH INSURANCE | Admitting: Cardiology

## 2013-11-09 ENCOUNTER — Other Ambulatory Visit (INDEPENDENT_AMBULATORY_CARE_PROVIDER_SITE_OTHER): Payer: Medicare Other

## 2013-11-09 ENCOUNTER — Encounter: Payer: Self-pay | Admitting: Cardiology

## 2013-11-09 VITALS — BP 144/76 | HR 69 | Ht 64.0 in | Wt 170.0 lb

## 2013-11-09 DIAGNOSIS — I4891 Unspecified atrial fibrillation: Secondary | ICD-10-CM

## 2013-11-09 DIAGNOSIS — E785 Hyperlipidemia, unspecified: Secondary | ICD-10-CM

## 2013-11-09 DIAGNOSIS — I10 Essential (primary) hypertension: Secondary | ICD-10-CM

## 2013-11-09 DIAGNOSIS — E559 Vitamin D deficiency, unspecified: Secondary | ICD-10-CM

## 2013-11-09 NOTE — Patient Instructions (Signed)
The current medical regimen is effective;  continue present plan and medications.  Follow up in 1 year with Dr Hochrein.  You will receive a letter in the mail 2 months before you are due.  Please call us when you receive this letter to schedule your follow up appointment.  

## 2013-11-09 NOTE — Progress Notes (Signed)
Patient here for labs only. Has appt with dr. Laurance Flatten next tuesday

## 2013-11-09 NOTE — Progress Notes (Signed)
HPI The patient presents for followup of atrial fibrillation. Since I last saw her she had one episode of palpitation lasting about 45 minutes. This was last week. It woke her from her sleep. It stopped spontaneously. She did go to Dr. Tawanna Sat office the next day. By that time she was in sinus rhythm and I did review the EKG. She wore a Holter. The results of this are pending.   However, she was having no symptoms at that time. She's not sure whether this was similar to previous fibrillation she had documented in 2011. She's not having any chest pressure, neck or arm discomfort. She's not had any shortness of breath, PND or orthopnea. She's not been having any weight gain or edema. She's had no presyncope or syncope..   Allergies  Allergen Reactions  . Shellfish Allergy Itching, Swelling and Rash    Current Outpatient Prescriptions  Medication Sig Dispense Refill  . aspirin 81 MG tablet Take 81 mg by mouth at bedtime.       . Calcium Carbonate-Vitamin D (CALCIUM + D PO) Take 1 tablet by mouth at bedtime.      . Cholecalciferol (VITAMIN D3) 1000 UNITS tablet Take 3,000 Units by mouth at bedtime.       Marland Kitchen diltiazem (DILACOR XR) 120 MG 24 hr capsule Take 120 mg by mouth at bedtime.       Marland Kitchen lisinopril-hydrochlorothiazide (PRINZIDE,ZESTORETIC) 10-12.5 MG per tablet Take 1 tablet by mouth at bedtime.       Marland Kitchen LOVAZA 1 G capsule TAKE  (1)  CAPSULE  TWICE DAILY.  60 capsule  5  . nitroGLYCERIN (NITROSTAT) 0.4 MG SL tablet Place 0.4 mg under the tongue as needed.        . potassium chloride (K-DUR) 10 MEQ tablet TAKE 1 TABLET DAILY  30 tablet  5  . traZODone (DESYREL) 50 MG tablet Take 1 tablet (50 mg total) by mouth at bedtime.  30 tablet  3   No current facility-administered medications for this visit.    Past Medical History  Diagnosis Date  . Hypertension   . Hyperlipidemia   . Osteoporosis   . Atrial fibrillation   . Arthritis   . Wears glasses     Past Surgical History  Procedure  Laterality Date  . Vaginal hysterectomy    . Cataract extraction    . Open reduction internal fixation (orif) distal radial fracture Right 12/28/2012    Procedure: OPEN REDUCTION INTERNAL FIXATION (ORIF) DISTAL RADIAL FRACTURE;  Surgeon: Hessie Dibble, MD;  Location: [facility];  Service: Orthopedics;  Laterality: Right;    ROS:  As stated in the HPI and negative for all other systems.  PHYSICAL EXAM BP 144/76  Pulse 69  Ht 5\' 4"  (1.626 m)  Wt 170 lb (77.111 kg)  BMI 29.17 kg/m2 GENERAL:  Well appearing HEENT:  Pupils equal round and reactive, fundi not visualized, oral mucosa unremarkable NECK:  No jugular venous distention, waveform within normal limits, carotid upstroke brisk and symmetric, no bruits, no thyromegaly LYMPHATICS:  No cervical, inguinal adenopathy LUNGS:  Clear to auscultation bilaterally BACK:  No CVA tenderness CHEST:  Unremarkable HEART:  PMI not displaced or sustained,S1 and S2 within normal limits, no S3, no S4, no clicks, no rubs, no murmurs ABD:  Flat, positive bowel sounds normal in frequency in pitch, no bruits, no rebound, no guarding, no midline pulsatile mass, no hepatomegaly, no splenomegaly EXT:  2 plus pulses throughout, no edema, no  cyanosis no clubbing  EKG:  NSR, rate 65 axis within normal limits, intervals within normal limits, no acute ST T wave changes.  1/30//2015  ASSESSMENT AND PLAN  ATRIAL FIBRILLATION:  Given the fact that are not document another episode of fibrillation I am not yet committed to starting her on anticoagulation though she would have indications.  We did discuss how she would need to present for EKG at that time let me know and I might apply an event monitor to try to sort this out. I don't think there is any indication for a Linq.    HTN:  The blood pressure is at target. No change in medications is indicated. We will continue with therapeutic lifestyle changes (TLC).;

## 2013-11-11 LAB — NMR, LIPOPROFILE
Cholesterol: 228 mg/dL — ABNORMAL HIGH (ref <200)
HDL CHOLESTEROL BY NMR: 67 mg/dL (ref >=40)
HDL Particle Number: 38.8 umol/L (ref >=30.5)
LDL PARTICLE NUMBER: 1475 nmol/L — AB (ref <1000)
LDL SIZE: 21.3 nm (ref >20.5)
LDLC SERPL CALC-MCNC: 137 mg/dL — ABNORMAL HIGH (ref <100)
LP-IR Score: <25 (ref <=45)
Small LDL Particle Number: 564 nmol/L — ABNORMAL HIGH (ref <=527)
TRIGLYCERIDES BY NMR: 120 mg/dL (ref <150)

## 2013-11-11 LAB — VITAMIN D 25 HYDROXY (VIT D DEFICIENCY, FRACTURES): Vit D, 25-Hydroxy: 42.6 ng/mL (ref 30.0–100.0)

## 2013-11-16 ENCOUNTER — Ambulatory Visit: Payer: Medicare Other | Admitting: Family Medicine

## 2013-11-18 ENCOUNTER — Telehealth: Payer: Self-pay | Admitting: *Deleted

## 2013-11-18 ENCOUNTER — Other Ambulatory Visit: Payer: Self-pay | Admitting: Family Medicine

## 2013-11-18 NOTE — Telephone Encounter (Signed)
Pt notified of holter monitor results. SR with PAT's and occ PVC per Dr. Jacelyn Grip. Results scanned into epic and sent to Dr. Percival Spanish who seen her on 11/09/2013

## 2013-11-21 ENCOUNTER — Ambulatory Visit: Payer: PRIVATE HEALTH INSURANCE | Admitting: Cardiology

## 2013-11-22 ENCOUNTER — Telehealth: Payer: Self-pay | Admitting: *Deleted

## 2013-11-22 NOTE — Telephone Encounter (Signed)
Pt notified of results appt scheduled

## 2013-11-23 ENCOUNTER — Encounter: Payer: Self-pay | Admitting: *Deleted

## 2013-12-01 ENCOUNTER — Ambulatory Visit: Payer: Medicare Other | Admitting: Family Medicine

## 2013-12-06 ENCOUNTER — Encounter: Payer: Self-pay | Admitting: Pharmacist Clinician (PhC)/ Clinical Pharmacy Specialist

## 2013-12-06 ENCOUNTER — Ambulatory Visit (INDEPENDENT_AMBULATORY_CARE_PROVIDER_SITE_OTHER): Payer: Medicare Other | Admitting: Pharmacist Clinician (PhC)/ Clinical Pharmacy Specialist

## 2013-12-06 DIAGNOSIS — E785 Hyperlipidemia, unspecified: Secondary | ICD-10-CM

## 2013-12-06 MED ORDER — ROSUVASTATIN CALCIUM 10 MG PO TABS: 10.0000 mg | ORAL_TABLET | Freq: Every day | ORAL | Status: DC

## 2013-12-06 NOTE — Progress Notes (Signed)
Patient comes in today with multiple questions regarding recent tachycardia/arrthymia, hyperlipidemia with atorvastatin intolerance, and dietary questions.  She has had multiple bouts of tachycardia and arrthymias which are very upsetting to her.  She were a 24 holter monitor, however it would benefit her more to wear a 21 days event monitor to capture all of the events she is having.  Patient has a strong family history of stroke at young ages, so if it is atrial fibrillation she would need to be started on warfarin based on her CHADs2 score.  Patient's LDL-P doubled from 700 to 1400 since stopping atorvastatin 80mg .  I started her on Crestor 10mg  today to see if she tolerates it before needed to increase the dose based on her response.  Lipids are to be re-checked in 8 weeks.  Also recommend a TSH with recent tachycardia.  We reviewed low fat diet extensively and also foods to avoid for causing heart rate irregularities.  She sees Dr. Laurance Flatten this Thursday.

## 2013-12-08 ENCOUNTER — Encounter: Payer: Self-pay | Admitting: Family Medicine

## 2013-12-08 ENCOUNTER — Ambulatory Visit (INDEPENDENT_AMBULATORY_CARE_PROVIDER_SITE_OTHER): Payer: Medicare Other

## 2013-12-08 ENCOUNTER — Ambulatory Visit (INDEPENDENT_AMBULATORY_CARE_PROVIDER_SITE_OTHER): Payer: Medicare Other | Admitting: Family Medicine

## 2013-12-08 VITALS — BP 145/73 | HR 84 | Temp 99.2°F | Ht 64.0 in

## 2013-12-08 DIAGNOSIS — Z23 Encounter for immunization: Secondary | ICD-10-CM

## 2013-12-08 DIAGNOSIS — R0989 Other specified symptoms and signs involving the circulatory and respiratory systems: Secondary | ICD-10-CM

## 2013-12-08 DIAGNOSIS — R0600 Dyspnea, unspecified: Secondary | ICD-10-CM

## 2013-12-08 DIAGNOSIS — I10 Essential (primary) hypertension: Secondary | ICD-10-CM

## 2013-12-08 DIAGNOSIS — R0609 Other forms of dyspnea: Secondary | ICD-10-CM

## 2013-12-08 DIAGNOSIS — R002 Palpitations: Secondary | ICD-10-CM

## 2013-12-08 DIAGNOSIS — Z780 Asymptomatic menopausal state: Secondary | ICD-10-CM

## 2013-12-08 NOTE — Patient Instructions (Addendum)
Continue current medications. Continue good therapeutic lifestyle changes which include good diet and exercise. Fall precautions discussed with patient. If an FOBT was given today- please return it to our front desk. If you are over 78 years old - you may need Prevnar 25 or the adult Pneumonia vaccine. You will receive a cardiogram today and we will place a [employer] monitor  which will help [place] to document the heart irregularity/palpitation problem that you are having Continue with the Crestor on Monday Wednesday and Friday and discontinue this if you develop any leg aches or pains. Continue to watch her caffeine intake Please return the FOBT The Prevnar vaccine may make her arms sore.  You can purchase debrox over-the-counter for a wax softening. Apply 2-3 drops to each ear for 2-3 nights in a row and repeat this in one week. Return to clinic in about 3 weeks for the nurse to remove the cerumen from the ears with irrigation                         Medicare Annual Wellness Visit  [facility] and the medical providers at [facility] strive to bring you the best medical care.  In doing so we not only want to address your current medical conditions and concerns but also to detect new conditions early and prevent illness, disease and health-related problems.    Medicare offers a yearly Wellness Visit which allows our clinical staff to assess your need for preventative services including immunizations, lifestyle education, counseling to decrease risk of preventable diseases and screening for fall risk and other medical concerns.    This visit is provided free of charge (no copay) for all Medicare recipients. The clinical pharmacists at [facility] have begun to conduct these Wellness Visits which will also include a thorough review of all your medications.    As you primary medical provider recommend that you make an appointment for your  Annual Wellness Visit if you have not done so already this year.  You may set up this appointment before you leave today or you may call back ([phone]) and schedule an appointment.  Please make sure when you call that you mention that you are scheduling your Annual Wellness Visit with the clinical pharmacist so that the appointment may be made for the proper length of time.     Pneumococcal Conjugate Vaccine What You Need to Know Your doctor recommends that you, or your child, get a dose of PCV13 vaccine today. WHY GET VACCINATED? Pneumococcal conjugate vaccine (called PCV13 or Prevnar 13) is recommended to protect infants and toddlers, and some older children and adults with certain health conditions, from pneumococcal disease. Pneumococcal disease is caused by infection with Streptococcus pneumoniae bacteria. These bacteria can spread from person to person through close contact. Pneumococcal disease can lead to severe health problems, including pneumonia, blood infections, and meningitis. Meningitis is an infection of the covering of the brain. Pneumococcal meningitis is fairly rare (less than 1 case per 100,000 people each year), but it leads to other health problems, including deafness and brain damage. In children, it is fatal in about 1 case out of 10. Children younger than two are at higher risk for serious disease than older children. People with certain medical conditions, people over age 110, and cigarette smokers are also at higher risk. Before vaccine, pneumococcal infections caused many problems each year in the [place]  in children younger than 5, including:  more than 700 cases of meningitis,  13,000 blood infections,  about 5 million ear infections, and  about 200 deaths. About 4,000 adults still die each year because of pneumococcal infections. Pneumococcal infections can be hard to treat because some strains are resistant to antibiotics. This makes prevention through  vaccination even more important. PCV13 VACCINE There are more than 90 types of pneumococcal bacteria. PCV13 protects against 13 of them. These 13 strains cause most severe infections in children and about half of infections in adults.  PCV13 is routinely given to children at 2, 4, 6, and 53 71 months of age. Children in this age range are at greatest risk for serious diseases caused by pneumococcal infection. PCV13 vaccine may also be recommended for some older children or adults. Your doctor can give you details. A second type of pneumococcal vaccine, called PPSV23, may also be given to some children and adults, including anyone over age 21. There is a separate Vaccine Information Statement for this vaccine. PRECAUTIONS  Anyone who has ever had a life-threatening allergic reaction to a dose of this vaccine, to an earlier pneumococcal vaccine called PCV7 (or Prevnar), or to any vaccine containing diphtheria toxoid (for example, DTaP), should not get PCV13. Anyone with a severe allergy to any component of PCV13 should not get the vaccine. Tell your doctor if the person being vaccinated has any severe allergies. If the person scheduled for vaccination is sick, your doctor might decide to reschedule the shot on another day. Your doctor can give you more information about any of these precautions. RISKS  With any medicine, including vaccines, there is a chance of side effects. These are usually mild and go away on their own, but serious reactions are also possible. Reported problems associated with PCV13 vary by dose and age, but generally:  About half of children became drowsy after the shot, had a temporary loss of appetite, or had redness or tenderness where the shot was given.  About 1 out of 3 had swelling where the shot was given.  About 1 out of 3 had a mild fever, and about 1 in 20 had a higher fever (over 102.2 F or 39 C).  Up to about 8 out of 10 became fussy or irritable. Adults  receiving the vaccine have reported redness, pain, and swelling where the shot was given. Mild fever, fatigue, headache, chills, or muscle pain have also been reported. Life-threatening allergic reactions from any vaccine are very rare. WHAT IF THERE IS A SERIOUS REACTION? What should I look for? Look for anything that concerns you, such as signs of a severe allergic reaction, very high fever, or behavior changes. Signs of a severe allergic reaction can include hives, swelling of the face and throat, difficulty breathing, a fast heartbeat, dizziness, and weakness. These would start a few minutes to a few hours after the vaccination. What should I do?  If you think it is a severe allergic reaction or other emergency that can't wait, get the person to the nearest hospital or call 9-1-1. Otherwise, call your doctor.  Afterward, the reaction should be reported to the "Vaccine Adverse Event Reporting System" (VAERS). Your doctor might file this report, or you can do it yourself through the VAERS web site at www.vaers.SamedayNews.es, or by calling (405)734-5704. VAERS is only for reporting reactions. They do not give medical advice. THE NATIONAL VACCINE INJURY COMPENSATION PROGRAM The National Vaccine Injury Compensation Program (VICP) was created in 1986.  Persons who believe they may have been injured by a vaccine can learn about the program and about filing a claim by calling (313) 091-3410 or visiting the [facility] website at GoldCloset.com.ee. HOW CAN I LEARN MORE?  Ask your doctor.  Call your local or state health department.  Contact the Centers for Disease Control and Prevention (CDC):  Call (864)861-0113 (1-800-CDC-INFO) or  Visit CDC's website at http://hunter.com/ CDC PCV13 Vaccine VIS (Interim) (12/03/11) Document Released: 07/20/2006 Document Revised: 01/17/2013 Document Reviewed: 01/12/2013 [facility] Patient Information 2014 [place].

## 2013-12-08 NOTE — Progress Notes (Signed)
Subjective:    Patient ID: Nancy Mccormick, female    DOB: 10/03/1934, 80 y.o.   MRN: [id number]  Hypertension Associated symptoms include headaches (alleviated with extra strength tylenol), palpitations (episodes of a "hard heart beat") and shortness of breath (with episodes of palpitations ).  Hyperlipidemia Associated symptoms include shortness of breath (with episodes of palpitations ).  Palpitations  Associated symptoms include dizziness (with quick change in position), shortness of breath (with episodes of palpitations ) and weakness.  Patient comes in today for follow up on chronic medical conditions.  Her main complaint is episodes of palpitations where her heart beats really hard and flutters. After these episodes she feels weak. These symptoms have worsened over the past 3 weeks. She wore a holter monitor last month after seeing Dr Jacelyn Grip and according to patient Dr. Percival Spanish read it as normal.  The patient recently saw a clinical thrombosis and was started back on a low-dose statin of Crestor 5 mg Monday Wednesday and Friday. She is due for a mammogram, Pap and pelvic, DEXA scan, chest x-ray, and FOBT. She will receive Prevnar vaccine today.    Review of Systems  Constitutional: Positive for activity change and fatigue.  Eyes: Negative.   Respiratory: Positive for shortness of breath (with episodes of palpitations ). Negative for chest tightness.   Cardiovascular: Positive for palpitations (episodes of a "hard heart beat").  Gastrointestinal: Negative.   Endocrine: Negative.   Genitourinary: Negative.   Musculoskeletal: Positive for arthralgias (chronic pain due to arthritis).  Skin: Negative.   Allergic/Immunologic: Negative.   Neurological: Positive for dizziness (with quick change in position), weakness and headaches (alleviated with extra strength tylenol). Negative for speech difficulty.  Hematological: Negative.   Psychiatric/Behavioral: Positive for sleep disturbance  (patient has been under a lot of stress recently).       Objective:   Physical Exam  Nursing note and vitals reviewed. Constitutional: She is oriented to person, place, and time. She appears well-developed and well-nourished.  Pleasant and cooperative. Patient indicates that she has been under a lot of stress during the past year.  HENT:  Head: Normocephalic and atraumatic.  Nose: Nose normal.  Mouth/Throat: Oropharynx is clear and moist.  Ears cerumen is present  Eyes: Conjunctivae and EOM are normal. Pupils are equal, round, and reactive to light. Right eye exhibits no discharge. Left eye exhibits no discharge. No scleral icterus.  Neck: Normal range of motion. Neck supple. No thyromegaly present.  Cardiovascular: Normal rate, normal heart sounds and intact distal pulses.   No murmur heard. The rhythm is occasionally irregular with a bigeminy pattern. The rate was anywhere between 60 and 72 per minute.  Pulmonary/Chest: Effort normal and breath sounds normal. No respiratory distress. She has no wheezes. She has no rales. She exhibits no tenderness.  Abdominal: Soft. Bowel sounds are normal. She exhibits no mass. There is no tenderness. There is no rebound and no guarding.  Musculoskeletal: Normal range of motion. She exhibits no edema and no tenderness.  Lymphadenopathy:    She has no cervical adenopathy.  Neurological: She is alert and oriented to person, place, and time. She has normal reflexes. No cranial nerve deficit.  Skin: Skin is warm and dry.  Psychiatric: She has a normal mood and affect. Her behavior is normal. Judgment and thought content normal.   EKG: PACs.  [facility] reading (PRIMARY) by  Dr. Brunilda Payor x-ray-no active disease  Assessment & Plan:  1. Need for pneumococcal vaccination - Pneumococcal conjugate vaccine 13-valent  2. Palpitations - Cardiac event monitor; Future -EKG  3. Essential hypertension, benign  4.  Postmenopausal - DG Bone Density; Future -Patient will be scheduled for a mammogram and pelvic  5. Dyspnea - DG Chest 2 View; Future  Patient Instructions       Continue current medications. Continue good therapeutic lifestyle changes which include good diet and exercise. Fall precautions discussed with patient. If an FOBT was given today- please return it to our front desk. If you are over 52 years old - you may need Prevnar 34 or the adult Pneumonia vaccine. You will receive a cardiogram today and we will place a [employer] monitor  which will help [place] to document the heart irregularity/palpitation problem that you are having Continue with the Crestor on Monday Wednesday and Friday and discontinue this if you develop any leg aches or pains. Continue to watch her caffeine intake Please return the FOBT The Prevnar vaccine may make her arms sore.  You can purchase debrox over-the-counter for a wax softening. Apply 2-3 drops to each ear for 2-3 nights in a row and repeat this in one week. Return to clinic in about 3 weeks for the nurse to remove the cerumen from the ears with irrigation                         Medicare Annual Wellness Visit  [facility] and the medical providers at [facility] strive to bring you the best medical care.  In doing so we not only want to address your current medical conditions and concerns but also to detect new conditions early and prevent illness, disease and health-related problems.    Medicare offers a yearly Wellness Visit which allows our clinical staff to assess your need for preventative services including immunizations, lifestyle education, counseling to decrease risk of preventable diseases and screening for fall risk and other medical concerns.    This visit is provided free of charge (no copay) for all Medicare recipients. The clinical pharmacists at [facility] have begun to conduct these  Wellness Visits which will also include a thorough review of all your medications.    As you primary medical provider recommend that you make an appointment for your Annual Wellness Visit if you have not done so already this year.  You may set up this appointment before you leave today or you may call back [phone] and schedule an appointment.  Please make sure when you call that you mention that you are scheduling your Annual Wellness Visit with the clinical pharmacist so that the appointment may be made for the proper length of time.     Pneumococcal Conjugate Vaccine What You Need to Know Your doctor recommends that you, or your child, get a dose of PCV13 vaccine today. WHY GET VACCINATED? Pneumococcal conjugate vaccine (called PCV13 or Prevnar 13) is recommended to protect infants and toddlers, and some older children and adults with certain health conditions, from pneumococcal disease. Pneumococcal disease is caused by infection with Streptococcus pneumoniae bacteria. These bacteria can spread from person to person through close contact. Pneumococcal disease can lead to severe health problems, including pneumonia, blood infections, and meningitis. Meningitis is an infection of the covering of the brain. Pneumococcal meningitis is fairly rare (less than 1 case per 100,000 people each year), but it leads to other health problems, including  deafness and brain damage. In children, it is fatal in about 1 case out of 10. Children younger than two are at higher risk for serious disease than older children. People with certain medical conditions, people over age 48, and cigarette smokers are also at higher risk. Before vaccine, pneumococcal infections caused many problems each year in the [place] in children younger than 5, including:  more than 700 cases of meningitis,  13,000 blood infections,  about 5 million ear infections, and  about 200 deaths. About 4,000 adults still die each  year because of pneumococcal infections. Pneumococcal infections can be hard to treat because some strains are resistant to antibiotics. This makes prevention through vaccination even more important. PCV13 VACCINE There are more than 90 types of pneumococcal bacteria. PCV13 protects against 13 of them. These 13 strains cause most severe infections in children and about half of infections in adults.  PCV13 is routinely given to children at 2, 4, 6, and 54 4 months of age. Children in this age range are at greatest risk for serious diseases caused by pneumococcal infection. PCV13 vaccine may also be recommended for some older children or adults. Your doctor can give you details. A second type of pneumococcal vaccine, called PPSV23, may also be given to some children and adults, including anyone over age 35. There is a separate Vaccine Information Statement for this vaccine. PRECAUTIONS  Anyone who has ever had a life-threatening allergic reaction to a dose of this vaccine, to an earlier pneumococcal vaccine called PCV7 (or Prevnar), or to any vaccine containing diphtheria toxoid (for example, DTaP), should not get PCV13. Anyone with a severe allergy to any component of PCV13 should not get the vaccine. Tell your doctor if the person being vaccinated has any severe allergies. If the person scheduled for vaccination is sick, your doctor might decide to reschedule the shot on another day. Your doctor can give you more information about any of these precautions. RISKS  With any medicine, including vaccines, there is a chance of side effects. These are usually mild and go away on their own, but serious reactions are also possible. Reported problems associated with PCV13 vary by dose and age, but generally:  About half of children became drowsy after the shot, had a temporary loss of appetite, or had redness or tenderness where the shot was given.  About 1 out of 3 had swelling where the shot was  given.  About 1 out of 3 had a mild fever, and about 1 in 20 had a higher fever (over 102.2 F or 39 C).  Up to about 8 out of 10 became fussy or irritable. Adults receiving the vaccine have reported redness, pain, and swelling where the shot was given. Mild fever, fatigue, headache, chills, or muscle pain have also been reported. Life-threatening allergic reactions from any vaccine are very rare. WHAT IF THERE IS A SERIOUS REACTION? What should I look for? Look for anything that concerns you, such as signs of a severe allergic reaction, very high fever, or behavior changes. Signs of a severe allergic reaction can include hives, swelling of the face and throat, difficulty breathing, a fast heartbeat, dizziness, and weakness. These would start a few minutes to a few hours after the vaccination. What should I do?  If you think it is a severe allergic reaction or other emergency that can't wait, get the person to the nearest hospital or call 9-1-1. Otherwise, call your doctor.  Afterward, the reaction should be reported  to the "Vaccine Adverse Event Reporting System" (VAERS). Your doctor might file this report, or you can do it yourself through the VAERS web site at www.vaers.SamedayNews.es, or by calling [phone]. VAERS is only for reporting reactions. They do not give medical advice. THE NATIONAL VACCINE INJURY COMPENSATION PROGRAM The National Vaccine Injury Compensation Program (VICP) was created in 1986. Persons who believe they may have been injured by a vaccine can learn about the program and about filing a claim by calling 657-001-8642 or visiting the [place] website at GoldCloset.com.ee. HOW CAN I LEARN MORE?  Ask your doctor.  Call your local or state health department.  Contact the Centers for Disease Control and Prevention (CDC):  Call 304-170-2691 (1-800-CDC-INFO) or  Visit CDC's website at http://hunter.com/ CDC PCV13 Vaccine VIS (Interim)  (12/03/11) Document Released: 07/20/2006 Document Revised: 01/17/2013 Document Reviewed: 01/12/2013 [facility] Patient Information 2014 [place].    Arrie Senate MD

## 2013-12-09 ENCOUNTER — Other Ambulatory Visit: Payer: Self-pay | Admitting: *Deleted

## 2013-12-09 DIAGNOSIS — R002 Palpitations: Secondary | ICD-10-CM

## 2013-12-13 ENCOUNTER — Telehealth: Payer: Self-pay | Admitting: *Deleted

## 2013-12-15 NOTE — Telephone Encounter (Signed)
Pt notified of event monitor results

## 2013-12-21 ENCOUNTER — Other Ambulatory Visit: Payer: Self-pay | Admitting: Cardiology

## 2013-12-26 ENCOUNTER — Telehealth: Payer: Self-pay | Admitting: *Deleted

## 2013-12-26 NOTE — Telephone Encounter (Signed)
Patient notified of event heart monitor results. Pt continues to have PVC's and PAC's. She reports they are not occuring as frequent as before and she has cut out all of her caffeine. She reports that the palpitations occur mostly at night. Plan is for pt to follow up with Dr. Percival Spanish in the next couple of weeks. She will call and make the appointment since she just seen him last month. Pt also reports that she was to call Dr. Laurance Flatten to let him know the strength of Crestor that Sharyn Lull put her on because she could not remember it when she was here last. She states that Sharyn Lull started her on Crestor 10mg  3 times a week.

## 2013-12-26 NOTE — Telephone Encounter (Signed)
Continue Crestor as started by Sharyn Lull, 10 mg 3 times weekly. If patient has getting followup visit with Dr. Susy Manor please let us know.

## 2014-02-01 ENCOUNTER — Other Ambulatory Visit: Payer: Medicare Other

## 2014-02-01 ENCOUNTER — Ambulatory Visit: Payer: Medicare Other

## 2014-03-17 ENCOUNTER — Other Ambulatory Visit: Payer: Self-pay | Admitting: Family Medicine

## 2014-04-19 ENCOUNTER — Ambulatory Visit (INDEPENDENT_AMBULATORY_CARE_PROVIDER_SITE_OTHER): Payer: Medicare Other | Admitting: Pharmacist

## 2014-04-19 ENCOUNTER — Ambulatory Visit (INDEPENDENT_AMBULATORY_CARE_PROVIDER_SITE_OTHER): Payer: Medicare Other

## 2014-04-19 ENCOUNTER — Encounter: Payer: Self-pay | Admitting: Pharmacist

## 2014-04-19 VITALS — Ht 64.0 in | Wt 184.0 lb

## 2014-04-19 DIAGNOSIS — Z780 Asymptomatic menopausal state: Secondary | ICD-10-CM

## 2014-04-19 DIAGNOSIS — M8580 Other specified disorders of bone density and structure, unspecified site: Secondary | ICD-10-CM | POA: Insufficient documentation

## 2014-04-19 DIAGNOSIS — Z13820 Encounter for screening for osteoporosis: Secondary | ICD-10-CM

## 2014-04-19 DIAGNOSIS — M949 Disorder of cartilage, unspecified: Secondary | ICD-10-CM

## 2014-04-19 DIAGNOSIS — M899 Disorder of bone, unspecified: Secondary | ICD-10-CM

## 2014-04-19 DIAGNOSIS — Z1212 Encounter for screening for malignant neoplasm of rectum: Secondary | ICD-10-CM

## 2014-04-19 LAB — HM DEXA SCAN

## 2014-04-19 NOTE — Addendum Note (Signed)
Addended by: Pollyann Kennedy F on: 04/19/2014 02:26 PM   Modules accepted: Orders

## 2014-04-19 NOTE — Progress Notes (Signed)
Patient ID: Stephie Xu, female   DOB: 03/03/1934, 80 y.o.   MRN: [id number] [facility] Current Height: Height: 5\' 4"  (162.6 cm)      Max Lifetime Height:  5' 4.5" Current Weight: Weight: 184 lb (83.462 kg)       Ethnicity:Caucasian    HPI: Does pt already have a diagnosis of:  Osteopenia?  Yes Osteoporosis?  No  Back Pain?  Yes  - arthritis  Kyphosis?  No Prior fracture?  Yes - wrist 2014 and shoulder Med(s) for Osteoporosis/Osteopenia:  Calcium +D Med(s) previously tried for Osteoporosis/Osteopenia:  Fosamax  Took less than 1 year - stopped due to concerns with side effects                                                             PMH: Age at menopause:  80yo - surgical Hysterectomy?  Yes Oophorectomy?  No HRT? Yes - Former.  Type/duration: few months Steroid Use?  No Thyroid med?  No History of cancer?  No History of digestive disorders (ie Crohn's)?  No Current or previous eating disorders?  No Last Vitamin D Result:  42.6 (11/2013) Last GFR Result:  63 (11/2013)   FH/SH: Family history of osteoporosis?  Yes - mother Parent with history of hip fracture?  Yes - mother Family history of breast cancer?  No Exercise?  Yes - walking Smoking?  No Alcohol?  No    Calcium Assessment Calcium Intake  # of servings/day  Calcium mg  Milk (8 oz) 0  x  300  = 0  Yogurt (4 oz) 0 x  200 = 0  Cheese (1 oz) 1 x  200 = 200  Other Calcium sources   250mg   Ca supplement Calcium 1 daily = 600mg    Estimated calcium intake per day 1050mg     DEXA Results Date of Test T-Score for AP Spine L1-L4 T-Score for Total Left Hip T-Score for Total Right Hip  04/19/2014 0.7 -0.8 -1.0  07/30/2011 0.9 -0.8 -1.1  03/21/2009 0.4 -0.8 -0.9        Lowest T-Score = -1.6 at neck of right hip  FRAX 10 year estimate: Total FX risk:  16%  (consider medication if >/= 20%) Hip FX risk:  2.1%  (consider medication if >/= 3%)  Assessment: Osteopenia with history of fractured risk - BMD  stable  Recommendations: 1.  Reveiwed DEXA and discussed fracture risk 2.  recommend calcium 1200mg  daily through supplementation or diet.  3.  recommend weight bearing exercise - 30 minutes at least 4 days per week.   4.  Counseled and educated about fall risk and prevention.  Recheck DEXA:  2 years  Time spent counseling patient:  25 minutes  Cherre Robins, PharmD, CPP

## 2014-04-19 NOTE — Patient Instructions (Signed)

## 2014-04-21 LAB — FECAL OCCULT BLOOD, IMMUNOCHEMICAL: FECAL OCCULT BLD: NEGATIVE

## 2014-05-01 ENCOUNTER — Encounter: Payer: Self-pay | Admitting: *Deleted

## 2014-06-15 ENCOUNTER — Other Ambulatory Visit (INDEPENDENT_AMBULATORY_CARE_PROVIDER_SITE_OTHER): Payer: Medicare Other

## 2014-06-15 DIAGNOSIS — E785 Hyperlipidemia, unspecified: Secondary | ICD-10-CM

## 2014-06-15 DIAGNOSIS — I4891 Unspecified atrial fibrillation: Secondary | ICD-10-CM

## 2014-06-15 DIAGNOSIS — I10 Essential (primary) hypertension: Secondary | ICD-10-CM

## 2014-06-15 DIAGNOSIS — E559 Vitamin D deficiency, unspecified: Secondary | ICD-10-CM

## 2014-06-15 LAB — POCT CBC
Granulocyte percent: 70.2 %G (ref 37–80)
HEMATOCRIT: 40.2 % (ref 37.7–47.9)
Hemoglobin: 13.6 g/dL (ref 12.2–16.2)
Lymph, poc: 2.1 (ref 0.6–3.4)
MCH, POC: 30.8 pg (ref 27–31.2)
MCHC: 33.8 g/dL (ref 31.8–35.4)
MCV: 91.1 fL (ref 80–97)
MPV: 8.7 fL (ref 0–99.8)
POC GRANULOCYTE: 5.8 (ref 2–6.9)
POC LYMPH PERCENT: 25.2 %L (ref 10–50)
Platelet Count, POC: 362 10*3/uL (ref 142–424)
RBC: 4.4 M/uL (ref 4.04–5.48)
RDW, POC: 13.9 %
WBC: 8.3 10*3/uL (ref 4.6–10.2)

## 2014-06-16 LAB — HEPATIC FUNCTION PANEL
ALT: 18 IU/L (ref 0–32)
AST: 17 IU/L (ref 0–40)
Albumin: 4.2 g/dL (ref 3.5–4.8)
Alkaline Phosphatase: 81 IU/L (ref 39–117)
BILIRUBIN TOTAL: 0.6 mg/dL (ref 0.0–1.2)
Bilirubin, Direct: 0.12 mg/dL (ref 0.00–0.40)
Total Protein: 6.4 g/dL (ref 6.0–8.5)

## 2014-06-16 LAB — BMP8+EGFR
BUN / CREAT RATIO: 28 — AB (ref 11–26)
BUN: 24 mg/dL (ref 8–27)
CHLORIDE: 103 mmol/L (ref 97–108)
CO2: 23 mmol/L (ref 18–29)
Calcium: 9.7 mg/dL (ref 8.7–10.3)
Creatinine, Ser: 0.85 mg/dL (ref 0.57–1.00)
GFR calc non Af Amer: 66 mL/min/{1.73_m2} (ref >59)
GFR, EST AFRICAN AMERICAN: 76 mL/min/{1.73_m2} (ref >59)
GLUCOSE: 112 mg/dL — AB (ref 65–99)
POTASSIUM: 4 mmol/L (ref 3.5–5.2)
Sodium: 143 mmol/L (ref 134–144)

## 2014-06-16 LAB — NMR, LIPOPROFILE
Cholesterol: 232 mg/dL — ABNORMAL HIGH (ref 100–199)
HDL CHOLESTEROL BY NMR: 72 mg/dL (ref >39)
HDL Particle Number: 41.1 umol/L (ref >=30.5)
LDL Particle Number: 1928 nmol/L — ABNORMAL HIGH (ref <1000)
LDL Size: 21.1 nm (ref >20.5)
LDLC SERPL CALC-MCNC: 135 mg/dL — AB (ref 0–99)
LP-IR Score: 30 (ref <=45)
SMALL LDL PARTICLE NUMBER: 880 nmol/L — AB (ref <=527)
TRIGLYCERIDES BY NMR: 125 mg/dL (ref 0–149)

## 2014-06-16 LAB — VITAMIN D 25 HYDROXY (VIT D DEFICIENCY, FRACTURES): Vit D, 25-Hydroxy: 32.5 ng/mL (ref 30.0–100.0)

## 2014-06-20 ENCOUNTER — Ambulatory Visit (INDEPENDENT_AMBULATORY_CARE_PROVIDER_SITE_OTHER): Payer: Medicare Other | Admitting: Family Medicine

## 2014-06-20 ENCOUNTER — Encounter: Payer: Self-pay | Admitting: Family Medicine

## 2014-06-20 VITALS — BP 107/68 | HR 73 | Temp 96.9°F | Ht 64.0 in | Wt 178.0 lb

## 2014-06-20 DIAGNOSIS — I10 Essential (primary) hypertension: Secondary | ICD-10-CM

## 2014-06-20 DIAGNOSIS — E785 Hyperlipidemia, unspecified: Secondary | ICD-10-CM

## 2014-06-20 DIAGNOSIS — I4891 Unspecified atrial fibrillation: Secondary | ICD-10-CM

## 2014-06-20 DIAGNOSIS — J301 Allergic rhinitis due to pollen: Secondary | ICD-10-CM

## 2014-06-20 DIAGNOSIS — Z789 Other specified health status: Secondary | ICD-10-CM

## 2014-06-20 DIAGNOSIS — Z888 Allergy status to other drugs, medicaments and biological substances status: Secondary | ICD-10-CM

## 2014-06-20 DIAGNOSIS — R002 Palpitations: Secondary | ICD-10-CM

## 2014-06-20 MED ORDER — NITROGLYCERIN 0.4 MG SL SUBL: 0.4000 mg | SUBLINGUAL_TABLET | SUBLINGUAL | Status: DC | PRN

## 2014-06-20 NOTE — Patient Instructions (Addendum)
Medicare Annual Wellness Visit  [facility] and the medical providers at [facility] strive to bring you the best medical care.  In doing so we not only want to address your current medical conditions and concerns but also to detect new conditions early and prevent illness, disease and health-related problems.    Medicare offers a yearly Wellness Visit which allows our clinical staff to assess your need for preventative services including immunizations, lifestyle education, counseling to decrease risk of preventable diseases and screening for fall risk and other medical concerns.    This visit is provided free of charge (no copay) for all Medicare recipients. The clinical pharmacists at [facility] have begun to conduct these Wellness Visits which will also include a thorough review of all your medications.    As you primary medical provider recommend that you make an appointment for your Annual Wellness Visit if you have not done so already this year.  You may set up this appointment before you leave today or you may call back [phone]) and schedule an appointment.  Please make sure when you call that you mention that you are scheduling your Annual Wellness Visit with the clinical pharmacist so that the appointment may be made for the proper length of time.     Continue current medications. Continue good therapeutic lifestyle changes which include good diet and exercise. Fall precautions discussed with patient. If an FOBT was given today- please return it to our front desk. If you are over 52 years old - you may need Prevnar 50 or the adult Pneumonia vaccine.  Flu Shots will be available at our office starting mid- September. Please call and schedule a FLU CLINIC APPOINTMENT.   Increase vitamin D3 to 2000  Daily The information regarding the medication that is injectable for cholesterol----if interested, please call back and  set up an appointment with the clinical pharmacist to look into using this medication If the palpitations continue please get back in touch with [place] Flonase, over-the-counter 1-2 sprays each nostril daily may help your allergic rhinitis

## 2014-06-20 NOTE — Progress Notes (Signed)
Subjective:    Patient ID: Nancy Mccormick, female    DOB: 11-17-1933, 80 y.o.   MRN: [id number]  HPI Pt here for follow up and management of chronic medical problems. 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 She is due to have a Pap and pelvic exam. She is due for a mammogram. She has had a couple of episodes of her heart rate increasing and she attributes this to caffeine. We did review her recent lab work with her and it was noted that her cholesterol numbers had increased. She is no longer able to tolerate her statin drug. She has been taking her vitamin D regularly.        Patient Active Problem List   Diagnosis Date Noted  . Osteopenia 04/19/2014  . Palpitations 11/04/2013  . Arrhythmia 11/04/2013  . FIBRILLATION, ATRIAL 09/11/2010  . HYPERLIPIDEMIA 09/10/2010  . HYPERTENSION 09/10/2010   Outpatient Encounter Prescriptions as of 06/20/2014  Medication Sig  . aspirin 81 MG tablet Take 81 mg by mouth at bedtime.   . Calcium Carbonate-Vitamin D (CALCIUM + D PO) Take 1 tablet by mouth at bedtime.  . Cholecalciferol (VITAMIN D3) 1000 UNITS tablet Take 3,000 Units by mouth at bedtime.   Marland Kitchen diltiazem (CARDIZEM CD) 120 MG 24 hr capsule TAKE (1) CAPSULE DAILY  . lisinopril-hydrochlorothiazide (PRINZIDE,ZESTORETIC) 10-12.5 MG per tablet TAKE 1 TABLET DAILY  . omega-3 acid ethyl esters (LOVAZA) 1 G capsule Take 1 capsule (1 g total) by mouth 2 (two) times daily.  . potassium chloride (K-DUR,KLOR-CON) 10 MEQ tablet TAKE 1 TABLET DAILY  . [DISCONTINUED] rosuvastatin (CRESTOR) 10 MG tablet Take 1 tablet (10 mg total) by mouth daily.  . nitroGLYCERIN (NITROSTAT) 0.4 MG SL tablet Place 0.4 mg under the tongue as needed.    . [DISCONTINUED] traZODone (DESYREL) 50 MG tablet Take 1 tablet (50 mg total) by mouth at bedtime.    Review of Systems  Constitutional: Negative.   HENT: Negative.   Eyes: Negative.   Respiratory: Negative.   Cardiovascular: Negative.        Elevated HR and  palpitations - occasionally Cardio is Hochrein- wore monitor about 1 year ago and was WNL  Gastrointestinal: Negative.   Endocrine: Negative.   Genitourinary: Negative.   Musculoskeletal: Negative.   Skin: Negative.   Allergic/Immunologic: Negative.   Neurological: Negative.   Hematological: Negative.   Psychiatric/Behavioral: Negative.        Objective:   Physical Exam  Nursing note and vitals reviewed. Constitutional: She is oriented to person, place, and time. She appears well-developed and well-nourished. No distress.  HENT:  Head: Normocephalic and atraumatic.  Right Ear: External ear normal.  Left Ear: External ear normal.  Mouth/Throat: Oropharynx is clear and moist.  Nasal congestion bilaterally  Eyes: Conjunctivae and EOM are normal. Pupils are equal, round, and reactive to light. Right eye exhibits no discharge. Left eye exhibits no discharge. No scleral icterus.  Neck: Normal range of motion. Neck supple. No thyromegaly present.  No carotid bruits  Cardiovascular: Normal rate, regular rhythm, normal heart sounds and intact distal pulses.  Exam reveals no gallop and no friction rub.   No murmur heard.  At 72 per minute  Pulmonary/Chest: Effort normal and breath sounds normal. No respiratory distress. She has no wheezes. She has no rales. She exhibits no tenderness.  Abdominal: Soft. Bowel sounds are normal. She exhibits no mass. There is no tenderness. There is no rebound and no guarding.  Musculoskeletal: Normal range of motion.  She exhibits no edema and no tenderness.  Lymphadenopathy:    She has no cervical adenopathy.  Neurological: She is alert and oriented to person, place, and time. She has normal reflexes. No cranial nerve deficit.  Skin: Skin is warm and dry. No rash noted.  Psychiatric: She has a normal mood and affect. Her behavior is normal. Judgment and thought content normal.   BP 107/68  Pulse 73  Temp(Src) 96.9 F (36.1 C) (Oral)  Ht 5\' 4"  (1.626  m)  Wt 178 lb (80.74 kg)  BMI 30.54 kg/m2        Assessment & Plan:   1. Atrial fibrillation, unspecified  2. HYPERLIPIDEMIA  3. HYPERTENSION  4. Statin intolerance  5. Palpitations  6. Allergic rhinitis due to pollen  Meds ordered this encounter  Medications  . nitroGLYCERIN (NITROSTAT) 0.4 MG SL tablet    Sig: Place 1 tablet (0.4 mg total) under the tongue as needed.    Dispense:  25 tablet    Refill:  11   Patient Instructions                       Medicare Annual Wellness Visit  [facility] and the medical providers at [facility] strive to bring you the best medical care.  In doing so we not only want to address your current medical conditions and concerns but also to detect new conditions early and prevent illness, disease and health-related problems.    Medicare offers a yearly Wellness Visit which allows our clinical staff to assess your need for preventative services including immunizations, lifestyle education, counseling to decrease risk of preventable diseases and screening for fall risk and other medical concerns.    This visit is provided free of charge (no copay) for all Medicare recipients. The clinical pharmacists at [facility] have begun to conduct these Wellness Visits which will also include a thorough review of all your medications.    As you primary medical provider recommend that you make an appointment for your Annual Wellness Visit if you have not done so already this year.  You may set up this appointment before you leave today or you may call back ([phone]) and schedule an appointment.  Please make sure when you call that you mention that you are scheduling your Annual Wellness Visit with the clinical pharmacist so that the appointment may be made for the proper length of time.     Continue current medications. Continue good therapeutic lifestyle changes which include good diet and exercise. Fall  precautions discussed with patient. If an FOBT was given today- please return it to our front desk. If you are over 23 years old - you may need Prevnar 23 or the adult Pneumonia vaccine.  Flu Shots will be available at our office starting mid- September. Please call and schedule a FLU CLINIC APPOINTMENT.   Increase vitamin D3 to 2000  Daily The information regarding the medication that is injectable for cholesterol----if interested, please call back and set up an appointment with the clinical pharmacist to look into using this medication If the palpitations continue please get back in touch with [place] Flonase, over-the-counter 1-2 sprays each nostril daily may help your allergic rhinitis    Arrie Senate MD

## 2014-06-22 ENCOUNTER — Telehealth: Payer: Self-pay | Admitting: Pharmacist Clinician (PhC)/ Clinical Pharmacy Specialist

## 2014-06-26 NOTE — Telephone Encounter (Signed)
We will set appt with clinical pharm to discuss and start Pt aware - appt made

## 2014-06-29 ENCOUNTER — Encounter: Payer: Self-pay | Admitting: Pharmacist

## 2014-06-29 ENCOUNTER — Ambulatory Visit (INDEPENDENT_AMBULATORY_CARE_PROVIDER_SITE_OTHER): Payer: Medicare Other | Admitting: Pharmacist

## 2014-06-29 VITALS — BP 110/67 | HR 77 | Ht 64.0 in | Wt 177.0 lb

## 2014-06-29 DIAGNOSIS — E785 Hyperlipidemia, unspecified: Secondary | ICD-10-CM

## 2014-06-29 NOTE — Progress Notes (Signed)
Lipid Clinic Consultation  Chief Complaint:   Chief Complaint  Patient presents with  . Hyperlipidemia     HPI:  Patient has been referred by Dr Redge Gainer to see if she would be a good candidate for new PCSK9 medications for hyperlipidemia.  Nancy Mccormick has chronic atrial fibrillation.  No personal history of stroke or MI or CAD.  She does have a strong family history of stroke - father has stroke in his 58's;  Mother had a stroke in 47's and sister has a stroke in her 3's She has tried both atorvastatin and rosuvastatin and both caused myalgias.  She is taking only fish oil for lipids.     Component Value Date/Time   CHOL 232 06/15/2014   LDL 135 06/15/2014   HDL 72 06/15/2014   LDL-P 1928 06/15/2014   TRIG 125 06/15/2014    Assessment: AHA ASCVD risk = 19.2% (though due to age greater than 63 there was no recommendation to start statin)  NCEP Risk Factors Present:  HTN, family history and age Primary Problem(s):  LDL or LDL-P elevated  Current NCEP Goals: LDL Goal < 100 HDL Goal >/= 45 Tg Goal < 150 Non-HDL Goal < 130  Secondary cause of hyperlipidemia present:  None Low fat diet followed?  Yes -   Low carb diet followed?  Yes -   Exercise?  No -     Assessment:   Elevated LDL-P - Has tried 2 statins and was intolerant.  I don't know that she is candidate for Repatha / PCSK9 medications since she only has a fib.  She certainly has a strong family history of stroke.   Recommendations: 1.  Filled out insurance verification for Repatha.  I did however discuss my doubt that the insurance will allow.  I did review how to use Repatha pen 2.. Will plan to try either livalo or pravastatin in the future if Repatha denied. 3.  Continue low fat diet 4.  Exercise as able  Time spent counseling patient:  25 minutes  Referring Provider:  Laurance Flatten   PharmD:  Cherre Robins, PHARMD

## 2014-07-04 ENCOUNTER — Other Ambulatory Visit: Payer: Self-pay

## 2014-07-04 MED ORDER — LISINOPRIL-HYDROCHLOROTHIAZIDE 10-12.5 MG PO TABS: ORAL_TABLET | ORAL | Status: DC

## 2014-07-10 ENCOUNTER — Other Ambulatory Visit: Payer: Medicare Other | Admitting: Family

## 2014-07-13 ENCOUNTER — Other Ambulatory Visit: Payer: Self-pay | Admitting: Pharmacist

## 2014-07-13 MED ORDER — PITAVASTATIN CALCIUM 2 MG PO TABS: 2.0000 mg | ORAL_TABLET | Freq: Every day | ORAL | Status: DC

## 2014-07-13 NOTE — Progress Notes (Signed)
Checked into insurance coverage of Repatha.  Patient's insurance [employer] does not currently cover Repatha and there is not prior approval process.   Patient does qualify for START program through pharmaceutical company but since she has only tried 2 statins we will try another.  Start Livalo 2mg  1 tablet daily.  If unable to tolerate then will consider Repatha.

## 2014-07-18 ENCOUNTER — Other Ambulatory Visit: Payer: Self-pay | Admitting: *Deleted

## 2014-07-18 MED ORDER — LISINOPRIL-HYDROCHLOROTHIAZIDE 10-12.5 MG PO TABS: ORAL_TABLET | ORAL | Status: DC

## 2014-08-11 ENCOUNTER — Other Ambulatory Visit: Payer: Medicare Other

## 2014-08-25 ENCOUNTER — Other Ambulatory Visit: Payer: Self-pay | Admitting: Cardiology

## 2014-09-19 ENCOUNTER — Encounter: Payer: Self-pay | Admitting: Nurse Practitioner

## 2014-09-19 ENCOUNTER — Ambulatory Visit (INDEPENDENT_AMBULATORY_CARE_PROVIDER_SITE_OTHER): Payer: Medicare Other | Admitting: Nurse Practitioner

## 2014-09-19 VITALS — BP 140/77 | HR 66 | Temp 97.2°F | Ht 64.0 in | Wt 182.0 lb

## 2014-09-19 DIAGNOSIS — Z0000 Encounter for general adult medical examination without abnormal findings: Secondary | ICD-10-CM

## 2014-09-19 DIAGNOSIS — Z01419 Encounter for gynecological examination (general) (routine) without abnormal findings: Secondary | ICD-10-CM

## 2014-09-19 LAB — POCT URINALYSIS DIPSTICK
Bilirubin, UA: NEGATIVE
GLUCOSE UA: NEGATIVE
Ketones, UA: NEGATIVE
Leukocytes, UA: NEGATIVE
NITRITE UA: NEGATIVE
PH UA: 6.5
Protein, UA: NEGATIVE
Spec Grav, UA: 1.015
UROBILINOGEN UA: NEGATIVE

## 2014-09-19 LAB — POCT UA - MICROSCOPIC ONLY
BACTERIA, U MICROSCOPIC: NEGATIVE
Casts, Ur, LPF, POC: NEGATIVE
Crystals, Ur, HPF, POC: NEGATIVE
MUCUS UA: NEGATIVE
WBC, UR, HPF, POC: NEGATIVE
Yeast, UA: NEGATIVE

## 2014-09-19 NOTE — Progress Notes (Signed)
   Subjective:    Patient ID: Nancy Mccormick, female    DOB: August 19, 1934, 80 y.o.   MRN: [id number]  HPI Patient sent here by Dr. Laurance Flatten for pap and pelvic exam- she had follow up of chronic medical problems by Dr. Laurance Flatten in September 2015. SHe is doing well today without complaints.    Review of Systems  Constitutional: Negative.   HENT: Negative.   Respiratory: Negative.   Cardiovascular: Negative.   Gastrointestinal: Negative.   Genitourinary: Negative.   Neurological: Negative.   Psychiatric/Behavioral: Negative.   All other systems reviewed and are negative.      Objective:   Physical Exam  Constitutional: She is oriented to person, place, and time. She appears well-developed and well-nourished.  HENT:  Head: Normocephalic.  Right Ear: Hearing, tympanic membrane, external ear and ear canal normal.  Left Ear: Hearing, tympanic membrane, external ear and ear canal normal.  Nose: Nose normal.  Mouth/Throat: Uvula is midline and oropharynx is clear and moist.  Eyes: Conjunctivae and EOM are normal. Pupils are equal, round, and reactive to light.  Neck: Normal range of motion and full passive range of motion without pain. Neck supple. No JVD present. Carotid bruit is not present. No thyroid mass and no thyromegaly present.  Cardiovascular: Normal rate, normal heart sounds and intact distal pulses.   No murmur heard. Pulmonary/Chest: Effort normal and breath sounds normal. Right breast exhibits no inverted nipple, no mass, no nipple discharge, no skin change and no tenderness. Left breast exhibits no inverted nipple, no mass, no nipple discharge, no skin change and no tenderness.  Abdominal: Soft. Bowel sounds are normal. She exhibits no mass. There is no tenderness.  Genitourinary: Vagina normal and uterus normal. No breast swelling, tenderness, discharge or bleeding.  bimanual exam-No adnexal masses or tenderness. Vaginal cuff intact   Musculoskeletal: Normal range of motion.    Lymphadenopathy:    She has no cervical adenopathy.  Neurological: She is alert and oriented to person, place, and time.  Skin: Skin is warm and dry.  Psychiatric: She has a normal mood and affect. Her behavior is normal. Judgment and thought content normal.   BP 140/77 mmHg  Pulse 66  Temp(Src) 97.2 F (36.2 C) (Oral)  Ht 5\' 4"  (1.626 m)  Wt 182 lb (82.555 kg)  BMI 31.22 kg/m2         Assessment & Plan:   1. Annual physical exam   2. Encounter for routine gynecological examination    Pap results pending Keep follow up appointment with Dr. Mayra Neer, FNP

## 2014-09-21 LAB — PAP IG (IMAGE GUIDED): PAP Smear Comment: 0

## 2014-09-22 ENCOUNTER — Telehealth: Payer: Self-pay

## 2014-09-22 NOTE — Telephone Encounter (Signed)
Letter sent with results

## 2014-09-22 NOTE — Telephone Encounter (Signed)
-----   Message from Chevis Pretty, [place] sent at 09/21/2014  5:54 PM EST ----- Pap normal- repeat in 2 years

## 2014-10-12 ENCOUNTER — Other Ambulatory Visit: Payer: Self-pay | Admitting: Family Medicine

## 2014-10-17 ENCOUNTER — Encounter: Payer: Self-pay | Admitting: Family Medicine

## 2014-10-17 ENCOUNTER — Encounter (INDEPENDENT_AMBULATORY_CARE_PROVIDER_SITE_OTHER): Payer: Self-pay

## 2014-10-17 ENCOUNTER — Ambulatory Visit (INDEPENDENT_AMBULATORY_CARE_PROVIDER_SITE_OTHER): Payer: Medicare Other | Admitting: Family Medicine

## 2014-10-17 VITALS — BP 181/97 | HR 64 | Temp 97.3°F | Ht 64.0 in | Wt 181.0 lb

## 2014-10-17 DIAGNOSIS — I10 Essential (primary) hypertension: Secondary | ICD-10-CM

## 2014-10-17 DIAGNOSIS — I4891 Unspecified atrial fibrillation: Secondary | ICD-10-CM

## 2014-10-17 DIAGNOSIS — E785 Hyperlipidemia, unspecified: Secondary | ICD-10-CM

## 2014-10-17 DIAGNOSIS — E559 Vitamin D deficiency, unspecified: Secondary | ICD-10-CM

## 2014-10-17 LAB — POCT CBC
Granulocyte percent: 70.7 %G (ref 37–80)
HEMATOCRIT: 42.7 % (ref 37.7–47.9)
Hemoglobin: 13.5 g/dL (ref 12.2–16.2)
LYMPH, POC: 2.2 (ref 0.6–3.4)
MCH, POC: 30 pg (ref 27–31.2)
MCHC: 31.7 g/dL — AB (ref 31.8–35.4)
MCV: 94.6 fL (ref 80–97)
MPV: 8.3 fL (ref 0–99.8)
PLATELET COUNT, POC: 343 10*3/uL (ref 142–424)
POC GRANULOCYTE: 5.7 (ref 2–6.9)
POC LYMPH PERCENT: 26.8 %L (ref 10–50)
RBC: 4.5 M/uL (ref 4.04–5.48)
RDW, POC: 14.4 %
WBC: 8.1 10*3/uL (ref 4.6–10.2)

## 2014-10-17 NOTE — Patient Instructions (Addendum)
Medicare Annual Wellness Visit  [facility] and the medical providers at [facility] strive to bring you the best medical care.  In doing so we not only want to address your current medical conditions and concerns but also to detect new conditions early and prevent illness, disease and health-related problems.    Medicare offers a yearly Wellness Visit which allows our clinical staff to assess your need for preventative services including immunizations, lifestyle education, counseling to decrease risk of preventable diseases and screening for fall risk and other medical concerns.    This visit is provided free of charge (no copay) for all Medicare recipients. The clinical pharmacists at [facility] have begun to conduct these Wellness Visits which will also include a thorough review of all your medications.    As you primary medical provider recommend that you make an appointment for your Annual Wellness Visit if you have not done so already this year.  You may set up this appointment before you leave today or you may call back ([phone]) and schedule an appointment.  Please make sure when you call that you mention that you are scheduling your Annual Wellness Visit with the clinical pharmacist so that the appointment may be made for the proper length of time.     Continue current medications. Continue good therapeutic lifestyle changes which include good diet and exercise. Fall precautions discussed with patient. If an FOBT was given today- please return it to our front desk. If you are over 74 years old - you may need Prevnar 90 or the adult Pneumonia vaccine.  Flu Shots will be available at our office starting mid- September. Please call and schedule a FLU CLINIC APPOINTMENT.   Drink plenty of fluids Avoid increased sodium intake We will call you with your lab work results once those results are available Stay as active as  possible

## 2014-10-17 NOTE — Progress Notes (Signed)
Subjective:    Patient ID: Nancy Mccormick, female    DOB: Jul 26, 1934, 81 y.o.   MRN: [id number]  HPI Pt here for follow up and management of chronic medical problems which include hypertension and hyperlipidemia. The patient continues to take her medications regularly and these include her cardiac enzyme CD 120, her lisinopril HCTZ 10/12.5 and her potassium. Lab work will be drawn today. She is not able to take the flu shot because she is allergic to. She is also intolerant of Livalo because of increased myalgias. For some reason her blood pressure is elevated today. The patient indicates she's been under a lot of stress with doctors visits etc. because of her husband who has a lot of medical problems.       Patient Active Problem List   Diagnosis Date Noted  . Osteopenia 04/19/2014  . Palpitations 11/04/2013  . Arrhythmia 11/04/2013  . FIBRILLATION, ATRIAL 09/11/2010  . HYPERLIPIDEMIA 09/10/2010  . HYPERTENSION 09/10/2010   Outpatient Encounter Prescriptions as of 10/17/2014  Medication Sig  . aspirin 81 MG tablet Take 81 mg by mouth at bedtime.   . Calcium Carbonate-Vitamin D (CALCIUM + D PO) Take 1 tablet by mouth at bedtime.  . Cholecalciferol (VITAMIN D3) 1000 UNITS tablet Take 3,000 Units by mouth at bedtime.   Marland Kitchen diltiazem (CARDIZEM CD) 120 MG 24 hr capsule TAKE ONE (1) CAPSULE EACH DAY  . lisinopril-hydrochlorothiazide (PRINZIDE,ZESTORETIC) 10-12.5 MG per tablet TAKE 1 TABLET DAILY  . nitroGLYCERIN (NITROSTAT) 0.4 MG SL tablet Place 1 tablet (0.4 mg total) under the tongue as needed.  Marland Kitchen omega-3 acid ethyl esters (LOVAZA) 1 G capsule TAKE ONE CAPSULE BY MOUTH TWICE A DAY  . potassium chloride (K-DUR) 10 MEQ tablet TAKE ONE (1) TABLET EACH DAY  . [DISCONTINUED] Pitavastatin Calcium (LIVALO) 2 MG TABS Take 1 tablet (2 mg total) by mouth daily.    Review of Systems  Constitutional: Negative.   HENT: Negative.   Eyes: Negative.   Respiratory: Negative.   Cardiovascular:  Negative.   Gastrointestinal: Negative.   Endocrine: Negative.   Genitourinary: Negative.   Musculoskeletal: Negative.   Skin: Negative.   Allergic/Immunologic: Negative.   Neurological: Negative.   Hematological: Negative.   Psychiatric/Behavioral: Negative.        Objective:   Physical Exam  Constitutional: She is oriented to person, place, and time. She appears well-developed and well-nourished. No distress.  The patient was pleasant alert and somewhat tearful today because of the stress she's been under at home and some special losses that she has had with people passing away.  HENT:  Head: Normocephalic and atraumatic.  Right Ear: External ear normal.  Left Ear: External ear normal.  Nose: Nose normal.  Mouth/Throat: Oropharynx is clear and moist.  Eyes: Conjunctivae and EOM are normal. Pupils are equal, round, and reactive to light. Right eye exhibits no discharge. Left eye exhibits no discharge. No scleral icterus.  Neck: Normal range of motion. Neck supple. No thyromegaly present.  There were no carotid bruits or anterior or posterior cervical adenopathy  Cardiovascular: Normal rate, regular rhythm, normal heart sounds and intact distal pulses.  Exam reveals no gallop and no friction rub.   No murmur heard. The heart rhythm was regular at about 72/m today.  Pulmonary/Chest: Effort normal and breath sounds normal. No respiratory distress. She has no wheezes. She has no rales. She exhibits no tenderness.  The abdomen was slightly obese but without masses tenderness or organ enlargement  Abdominal: Soft. Bowel sounds  are normal. She exhibits no mass. There is no tenderness. There is no rebound and no guarding.  Musculoskeletal: Normal range of motion. She exhibits no edema or tenderness.  Lymphadenopathy:    She has no cervical adenopathy.  Neurological: She is alert and oriented to person, place, and time. She has normal reflexes. No cranial nerve deficit.  Skin: Skin is  warm and dry. No rash noted.  Psychiatric: She has a normal mood and affect. Her behavior is normal. Judgment and thought content normal.  Nursing note and vitals reviewed.  BP 181/97 mmHg  Pulse 64  Temp(Src) 97.3 F (36.3 C) (Oral)  Ht 5' 4"  (1.626 m)  Wt 181 lb (82.101 kg)  BMI 31.05 kg/m2   Repeat blood pressure 170/100     Assessment & Plan:  1. Hyperlipidemia LDL goal <100 -We will make decisions about what to do with your cholesterol since you are statin intolerant once the lab work is returned - POCT CBC - NMR, lipoprofile  2. Atrial fibrillation, unspecified -Continue current treatment and avoid caffeine - POCT CBC  3. Essential hypertension, benign -Return blood pressures for review in a couple of weeks -Continue to watch sodium intake - POCT CBC - BMP8+EGFR - Hepatic function panel  4. Vitamin D deficiency -Continue current vitamin D treatment and any adjustments will be made once blood work is returned - Vit D  25 hydroxy (rtn osteoporosis monitoring)  Patient Instructions                       Medicare Annual Wellness Visit  [facility] and the medical providers at [facility] strive to bring you the best medical care.  In doing so we not only want to address your current medical conditions and concerns but also to detect new conditions early and prevent illness, disease and health-related problems.    Medicare offers a yearly Wellness Visit which allows our clinical staff to assess your need for preventative services including immunizations, lifestyle education, counseling to decrease risk of preventable diseases and screening for fall risk and other medical concerns.    This visit is provided free of charge (no copay) for all Medicare recipients. The clinical pharmacists at [facility] have begun to conduct these Wellness Visits which will also include a thorough review of all your medications.    As you  primary medical provider recommend that you make an appointment for your Annual Wellness Visit if you have not done so already this year.  You may set up this appointment before you leave today or you may call back ([phone]) and schedule an appointment.  Please make sure when you call that you mention that you are scheduling your Annual Wellness Visit with the clinical pharmacist so that the appointment may be made for the proper length of time.     Continue current medications. Continue good therapeutic lifestyle changes which include good diet and exercise. Fall precautions discussed with patient. If an FOBT was given today- please return it to our front desk. If you are over 33 years old - you may need Prevnar 55 or the adult Pneumonia vaccine.  Flu Shots will be available at our office starting mid- September. Please call and schedule a FLU CLINIC APPOINTMENT.   Drink plenty of fluids Avoid increased sodium intake We will call you with your lab work results once those results are available Stay as active as possible   Arrie Senate MD

## 2014-10-18 LAB — BMP8+EGFR
BUN/Creatinine Ratio: 15 (ref 11–26)
BUN: 13 mg/dL (ref 8–27)
CALCIUM: 9.2 mg/dL (ref 8.7–10.3)
CO2: 26 mmol/L (ref 18–29)
Chloride: 104 mmol/L (ref 97–108)
Creatinine, Ser: 0.85 mg/dL (ref 0.57–1.00)
GFR calc Af Amer: 76 mL/min/{1.73_m2} (ref >59)
GFR calc non Af Amer: 66 mL/min/{1.73_m2} (ref >59)
Glucose: 100 mg/dL — ABNORMAL HIGH (ref 65–99)
POTASSIUM: 4.9 mmol/L (ref 3.5–5.2)
SODIUM: 145 mmol/L — AB (ref 134–144)

## 2014-10-18 LAB — HEPATIC FUNCTION PANEL
ALT: 16 IU/L (ref 0–32)
AST: 13 IU/L (ref 0–40)
Albumin: 4.1 g/dL (ref 3.5–4.8)
Alkaline Phosphatase: 77 IU/L (ref 39–117)
BILIRUBIN DIRECT: 0.12 mg/dL (ref 0.00–0.40)
BILIRUBIN TOTAL: 0.5 mg/dL (ref 0.0–1.2)
Total Protein: 6.2 g/dL (ref 6.0–8.5)

## 2014-10-18 LAB — NMR, LIPOPROFILE
Cholesterol: 243 mg/dL — ABNORMAL HIGH (ref 100–199)
HDL CHOLESTEROL BY NMR: 75 mg/dL (ref >39)
HDL Particle Number: 40.5 umol/L (ref >=30.5)
LDL PARTICLE NUMBER: 1352 nmol/L — AB (ref <1000)
LDL Size: 21.9 nm (ref >20.5)
LDL-C: 128 mg/dL — ABNORMAL HIGH (ref 0–99)
LP-IR Score: 35 (ref <=45)
Small LDL Particle Number: 270 nmol/L (ref <=527)
TRIGLYCERIDES BY NMR: 198 mg/dL — AB (ref 0–149)

## 2014-10-18 LAB — VITAMIN D 25 HYDROXY (VIT D DEFICIENCY, FRACTURES): Vit D, 25-Hydroxy: 23.8 ng/mL — ABNORMAL LOW (ref 30.0–100.0)

## 2014-10-26 ENCOUNTER — Encounter: Payer: Self-pay | Admitting: Pharmacist

## 2014-10-26 ENCOUNTER — Ambulatory Visit (INDEPENDENT_AMBULATORY_CARE_PROVIDER_SITE_OTHER): Payer: Medicare Other | Admitting: Pharmacist

## 2014-10-26 DIAGNOSIS — E785 Hyperlipidemia, unspecified: Secondary | ICD-10-CM

## 2014-10-26 MED ORDER — PRAVASTATIN SODIUM 20 MG PO TABS: ORAL_TABLET | ORAL | Status: DC

## 2014-10-26 NOTE — Patient Instructions (Signed)
[email or web address].[email or web address] [email or web address]  Book - Complete Food Counter By:  Isabella Stalling and Lanell Matar  Carbohydrate Limiting Diet:  30 to 45 grams per meal   15 to 20 grams per snack

## 2014-10-26 NOTE — Progress Notes (Signed)
Lipid Clinic Consultation  Chief Complaint:   Chief Complaint  Patient presents with  . Hyperlipidemia     HPI:  Patient has been referred by Dr Redge Gainer for hyperlipidemia.  I saw her about 3 months ago and started Livalo 2mg  1 tablet daily.  She took for 1 month and then started to have myalgias.  She has also tried both atorvastatin and rosuvastatin and both caused myalgias.  She is taking only fish oil for lipids.  No personal history of stroke or MI or CAD.  She does have a strong family history of stroke - father has stroke in his 65's;  Mother had a stroke in 80's and sister has a stroke in her 75's    Component Value Date/Time   CHOL 243 10/17/2014   LDL 128 10/17/2014   HDL 75 10/17/2014   LDL-P 1352 10/17/2014   TRIG 198 10/17/2014       Component Value Date/Time   CHOL 232 06/15/2014   LDL 135 06/15/2014   HDL 72 06/15/2014   LDL-P 1928 06/15/2014   TRIG 125 06/15/2014    Assessment: AHA ASCVD risk = 19.2% (though due to age greater than 38 there was no recommendation to start statin)  NCEP Risk Factors Present:  HTN, family history and age Primary Problem(s):  LDL or LDL-P elevated and Triglycerides elevated (verified patient was fasting)  Current NCEP Goals: LDL Goal < 100 HDL Goal >/= 45 Tg Goal < 150 Non-HDL Goal < 130  Secondary cause of hyperlipidemia present:  None Low fat diet followed?  Yes mostly though she has been under more stress related to husband's health.  Eating more.   Low carb diet followed?  Yes -   Exercise?  Yes - walking a few days per week but has been less recently.    Assessment:   Elevated LDL-P and triglycerides;   Has tried 3 statins and was intolerant.    Recommendations: 1.  Start pravastatin 20mg  1 tablet TIW 2.. If unable to tolerate pravastatin then will look into coverage of Repatha or Praluent 3.  Continue low fat diet.  Patient interested in counting calories.  Recommended 1500kcal per day or less.  Also gave  recommendation of 2 computer programs to help and a book to help with calorie counting 4.  Increase exercise to 30 minutes or more at least 5 days per week  Time spent counseling patient:  20 minutes  Referring Provider:  Laurance Flatten   PharmD:  Cherre Robins, PHARMD

## 2014-11-07 ENCOUNTER — Encounter: Payer: Self-pay | Admitting: *Deleted

## 2014-12-20 ENCOUNTER — Other Ambulatory Visit: Payer: Self-pay | Admitting: Pharmacist

## 2015-01-15 ENCOUNTER — Other Ambulatory Visit: Payer: Self-pay | Admitting: Family Medicine

## 2015-01-15 ENCOUNTER — Other Ambulatory Visit: Payer: Self-pay | Admitting: *Deleted

## 2015-01-15 MED ORDER — DILTIAZEM HCL ER COATED BEADS 120 MG PO CP24: ORAL_CAPSULE | ORAL | Status: DC

## 2015-01-16 ENCOUNTER — Other Ambulatory Visit: Payer: Self-pay

## 2015-01-16 MED ORDER — POTASSIUM CHLORIDE ER 10 MEQ PO TBCR: EXTENDED_RELEASE_TABLET | ORAL | Status: DC

## 2015-02-15 ENCOUNTER — Ambulatory Visit: Payer: Medicare Other | Admitting: Family Medicine

## 2015-02-16 ENCOUNTER — Ambulatory Visit (INDEPENDENT_AMBULATORY_CARE_PROVIDER_SITE_OTHER): Payer: Medicare Other | Admitting: Family Medicine

## 2015-02-16 ENCOUNTER — Encounter: Payer: Self-pay | Admitting: Family Medicine

## 2015-02-16 VITALS — BP 148/86 | HR 63 | Temp 97.5°F | Ht 64.0 in | Wt 183.0 lb

## 2015-02-16 DIAGNOSIS — I10 Essential (primary) hypertension: Secondary | ICD-10-CM | POA: Diagnosis not present

## 2015-02-16 DIAGNOSIS — E559 Vitamin D deficiency, unspecified: Secondary | ICD-10-CM

## 2015-02-16 DIAGNOSIS — H6123 Impacted cerumen, bilateral: Secondary | ICD-10-CM

## 2015-02-16 DIAGNOSIS — E785 Hyperlipidemia, unspecified: Secondary | ICD-10-CM

## 2015-02-16 DIAGNOSIS — I4891 Unspecified atrial fibrillation: Secondary | ICD-10-CM

## 2015-02-16 DIAGNOSIS — R5383 Other fatigue: Secondary | ICD-10-CM | POA: Diagnosis not present

## 2015-02-16 LAB — POCT CBC
Granulocyte percent: 66.7 %G (ref 37–80)
HCT, POC: 44.3 % (ref 37.7–47.9)
Hemoglobin: 13.8 g/dL (ref 12.2–16.2)
Lymph, poc: 2.3 (ref 0.6–3.4)
MCH, POC: 28.5 pg (ref 27–31.2)
MCHC: 31.3 g/dL — AB (ref 31.8–35.4)
MCV: 91.2 fL (ref 80–97)
MPV: 7.5 fL (ref 0–99.8)
PLATELET COUNT, POC: 355 10*3/uL (ref 142–424)
POC GRANULOCYTE: 6.1 (ref 2–6.9)
POC LYMPH %: 25.2 % (ref 10–50)
RBC: 4.85 M/uL (ref 4.04–5.48)
RDW, POC: 14.2 %
WBC: 9.2 10*3/uL (ref 4.6–10.2)

## 2015-02-16 NOTE — Patient Instructions (Addendum)
Medicare Annual Wellness Visit  [facility] and the medical providers at [facility] strive to bring you the best medical care.  In doing so we not only want to address your current medical conditions and concerns but also to detect new conditions early and prevent illness, disease and health-related problems.    Medicare offers a yearly Wellness Visit which allows our clinical staff to assess your need for preventative services including immunizations, lifestyle education, counseling to decrease risk of preventable diseases and screening for fall risk and other medical concerns.    This visit is provided free of charge (no copay) for all Medicare recipients. The clinical pharmacists at [facility] have begun to conduct these Wellness Visits which will also include a thorough review of all your medications.    As you primary medical provider recommend that you make an appointment for your Annual Wellness Visit if you have not done so already this year.  You may set up this appointment before you leave today or you may call back ([phone]) and schedule an appointment.  Please make sure when you call that you mention that you are scheduling your Annual Wellness Visit with the clinical pharmacist so that the appointment may be made for the proper length of time.     Continue current medications. Continue good therapeutic lifestyle changes which include good diet and exercise. Fall precautions discussed with patient. If an FOBT was given today- please return it to our front desk. If you are over 14 years old - you may need Prevnar 35 or the adult Pneumonia vaccine.  Flu Shots are still available at our office. If you still haven't had one please call to set up a nurse visit to get one.   After your visit with [place] today you will receive a survey in the mail or online from [employer] regarding your care with [place]. Please take a moment to  fill this out. Your feedback is very important to [place] as you can help [place] better understand your patient needs as well as improve your experience and satisfaction. WE CARE ABOUT YOU!!!    We will call you with the lab work results once these become available Continue walking and exercising daily as this is not only good for you physically and for your joint pain but also good for you emotionally. Record blood pressure readings at home and bring these readings to the next visit Continue to watch your sodium intake and work on getting the weight down as much as possible Use Debrox eardrops and use these 2-3 drops to each ear 3 nights in a row wait 1 week and repeat. Call and make an appointment for about 3 weeks for one of our nurses to irrigate the cerumen out

## 2015-02-16 NOTE — Progress Notes (Signed)
Subjective:    Patient ID: Nancy Mccormick, female    DOB: [birth year], 81 y.o.   MRN: [id number]  HPI Pt here for follow up and management of chronic medical problems which includes hypertension and hyperlipidemia. She is taking medications regularly. The patient complains of no chest pain shortness of breath or voiding issues. She does complain of some fatigue. She walks about 2 miles daily and this helps her arthritis. She worries about her husband who is become more home confined because of musculoskeletal and circulation issues. We answered several questions about him in the visit today.       Patient Active Problem List   Diagnosis Date Noted  . Osteopenia 04/19/2014  . Palpitations 11/04/2013  . Arrhythmia 11/04/2013  . FIBRILLATION, ATRIAL 09/11/2010  . HYPERLIPIDEMIA 09/10/2010  . HYPERTENSION 09/10/2010   Outpatient Encounter Prescriptions as of 02/16/2015  Medication Sig  . aspirin 81 MG tablet Take 81 mg by mouth at bedtime.   . Calcium Carbonate-Vitamin D (CALCIUM + D PO) Take 1 tablet by mouth at bedtime.  . Cholecalciferol (VITAMIN D3) 1000 UNITS tablet Take 3,000 Units by mouth at bedtime.   Marland Kitchen diltiazem (CARDIZEM CD) 120 MG 24 hr capsule TAKE ONE (1) CAPSULE EACH DAY  . lisinopril-hydrochlorothiazide (PRINZIDE,ZESTORETIC) 10-12.5 MG per tablet TAKE ONE (1) TABLET EACH DAY  . nitroGLYCERIN (NITROSTAT) 0.4 MG SL tablet Place 1 tablet (0.4 mg total) under the tongue as needed.  Marland Kitchen omega-3 acid ethyl esters (LOVAZA) 1 G capsule TAKE ONE CAPSULE BY MOUTH TWICE A DAY  . potassium chloride (K-DUR) 10 MEQ tablet TAKE ONE (1) TABLET EACH DAY  . pravastatin (PRAVACHOL) 20 MG tablet TAKE 1 TABLET 3 TIMES PER WEEK   No facility-administered encounter medications on file as of 02/16/2015.      Review of Systems  Constitutional: Negative.   HENT: Negative.   Eyes: Negative.   Respiratory: Negative.   Cardiovascular: Negative.   Gastrointestinal: Negative.   Endocrine:  Negative.   Genitourinary: Negative.   Musculoskeletal: Negative.   Skin: Negative.   Allergic/Immunologic: Negative.   Neurological: Negative.   Hematological: Negative.   Psychiatric/Behavioral: Negative.        Objective:   Physical Exam  Constitutional: She is oriented to person, place, and time. She appears well-developed and well-nourished.  HENT:  Head: Normocephalic and atraumatic.  Nose: Nose normal.  Mouth/Throat: Oropharynx is clear and moist.  Bilateral ears cerumen  Eyes: Conjunctivae and EOM are normal. Pupils are equal, round, and reactive to light. Right eye exhibits no discharge. Left eye exhibits no discharge. No scleral icterus.  Neck: Normal range of motion. Neck supple. No thyromegaly present.  No bruits or thyroid enlargement  Cardiovascular: Normal rate, regular rhythm, normal heart sounds and intact distal pulses.   No murmur heard. At 60/m  Pulmonary/Chest: Effort normal and breath sounds normal. No respiratory distress. She has no wheezes. She has no rales. She exhibits no tenderness.  Clear anteriorly and posteriorly  Abdominal: Soft. Bowel sounds are normal. She exhibits no mass. There is no tenderness. There is no rebound and no guarding.  No abdominal tenderness  Musculoskeletal: Normal range of motion. She exhibits no edema or tenderness.  Lymphadenopathy:    She has no cervical adenopathy.  Neurological: She is alert and oriented to person, place, and time. She has normal reflexes. No cranial nerve deficit.  Skin: Skin is warm and dry. No rash noted.  Psychiatric: She has a normal mood and affect. Her behavior is  normal. Judgment and thought content normal.  Nursing note and vitals reviewed.  BP 144/96 mmHg  Pulse 63  Temp(Src) 97.5 F (36.4 C) (Oral)  Ht _0  (1.626 m)  Wt 183 lb (83.008 kg)  BMI 31.40 kg/m2       Assessment & Plan:  1. Hyperlipidemia LDL goal <100 -Continue aggressive therapeutic lifestyle changes and fish oil -  POCT CBC - NMR, lipoprofile  2. Atrial fibrillation, unspecified -The rhythm today is regular and no change in treatment is necessary. Continue to watch caffeine intake - POCT CBC  3. Essential hypertension, benign -The blood pressure remains slightly elevated on the exam today even on repeating the reading. Please monitor blood pressure readings at home, watch sodium intake and try to lose a little bit of weight. Bring these readings back to the next visit and we may have to add to your current treatment - POCT CBC - BMP8+EGFR - Hepatic function panel  4. Vitamin D deficiency -Continue current treatment pending results of lab work - POCT CBC - Vit D  25 hydroxy (rtn osteoporosis monitoring)  5. Other fatigue -Stay active physically and we will check a thyroid profile to make sure this is not playing a role with your fatigue - Thyroid Panel With TSH  6. Impacted cerumen of both ears -Use the Debrox eardrops as directed and come back to the clinic in 3 weeks and let the nurse irrigate the cerumen out  Patient Instructions                       Medicare Annual Wellness Visit  [facility] and the medical providers at [facility] strive to bring you the best medical care.  In doing so we not only want to address your current medical conditions and concerns but also to detect new conditions early and prevent illness, disease and health-related problems.    Medicare offers a yearly Wellness Visit which allows our clinical staff to assess your need for preventative services including immunizations, lifestyle education, counseling to decrease risk of preventable diseases and screening for fall risk and other medical concerns.    This visit is provided free of charge (no copay) for all Medicare recipients. The clinical pharmacists at [facility] have begun to conduct these Wellness Visits which will also include a thorough review of all your  medications.    As you primary medical provider recommend that you make an appointment for your Annual Wellness Visit if you have not done so already this year.  You may set up this appointment before you leave today or you may call back ([phone]) and schedule an appointment.  Please make sure when you call that you mention that you are scheduling your Annual Wellness Visit with the clinical pharmacist so that the appointment may be made for the proper length of time.     Continue current medications. Continue good therapeutic lifestyle changes which include good diet and exercise. Fall precautions discussed with patient. If an FOBT was given today- please return it to our front desk. If you are over 33 years old - you may need Prevnar 85 or the adult Pneumonia vaccine.  Flu Shots are still available at our office. If you still haven't had one please call to set up a nurse visit to get one.   After your visit with [place] today you will receive a survey in the mail or online from [employer] regarding your care  with [place]. Please take a moment to fill this out. Your feedback is very important to [place] as you can help [place] better understand your patient needs as well as improve your experience and satisfaction. WE CARE ABOUT YOU!!!    We will call you with the lab work results once these become available Continue walking and exercising daily as this is not only good for you physically and for your joint pain but also good for you emotionally. Record blood pressure readings at home and bring these readings to the next visit Continue to watch your sodium intake and work on getting the weight down as much as possible Use Debrox eardrops and use these 2-3 drops to each ear 3 nights in a row wait 1 week and repeat. Call and make an appointment for about 3 weeks for one of our nurses to irrigate the cerumen out   Arrie Senate MD

## 2015-02-17 LAB — HEPATIC FUNCTION PANEL
ALBUMIN: 4.3 g/dL (ref 3.5–4.8)
ALT: 17 IU/L (ref 0–32)
AST: 17 IU/L (ref 0–40)
Alkaline Phosphatase: 75 IU/L (ref 39–117)
BILIRUBIN TOTAL: 0.8 mg/dL (ref 0.0–1.2)
Bilirubin, Direct: 0.14 mg/dL (ref 0.00–0.40)
Total Protein: 6.4 g/dL (ref 6.0–8.5)

## 2015-02-17 LAB — NMR, LIPOPROFILE
CHOLESTEROL: 251 mg/dL — AB (ref 100–199)
HDL Cholesterol by NMR: 70 mg/dL (ref >39)
HDL Particle Number: 43.1 umol/L (ref >=30.5)
LDL PARTICLE NUMBER: 1974 nmol/L — AB (ref <1000)
LDL SIZE: 21.3 nm (ref >20.5)
LDL-C: 148 mg/dL — AB (ref 0–99)
LP-IR SCORE: 42 (ref <=45)
Small LDL Particle Number: 750 nmol/L — ABNORMAL HIGH (ref <=527)
TRIGLYCERIDES BY NMR: 163 mg/dL — AB (ref 0–149)

## 2015-02-17 LAB — THYROID PANEL WITH TSH
FREE THYROXINE INDEX: 1.8 (ref 1.2–4.9)
T3 UPTAKE RATIO: 26 % (ref 24–39)
T4 TOTAL: 6.8 ug/dL (ref 4.5–12.0)
TSH: 2.4 u[IU]/mL (ref 0.450–4.500)

## 2015-02-17 LAB — BMP8+EGFR
BUN/Creatinine Ratio: 16 (ref 11–26)
BUN: 12 mg/dL (ref 8–27)
CALCIUM: 9.3 mg/dL (ref 8.7–10.3)
CHLORIDE: 103 mmol/L (ref 97–108)
CO2: 25 mmol/L (ref 18–29)
CREATININE: 0.75 mg/dL (ref 0.57–1.00)
GFR calc Af Amer: 88 mL/min/{1.73_m2} (ref >59)
GFR, EST NON AFRICAN AMERICAN: 77 mL/min/{1.73_m2} (ref >59)
Glucose: 81 mg/dL (ref 65–99)
Potassium: 4.6 mmol/L (ref 3.5–5.2)
Sodium: 143 mmol/L (ref 134–144)

## 2015-02-17 LAB — VITAMIN D 25 HYDROXY (VIT D DEFICIENCY, FRACTURES): VIT D 25 HYDROXY: 27.1 ng/mL — AB (ref 30.0–100.0)

## 2015-02-19 ENCOUNTER — Telehealth: Payer: Self-pay | Admitting: *Deleted

## 2015-02-19 NOTE — Telephone Encounter (Signed)
-----   Message from Chipper Herb, MD sent at 02/18/2015 10:34 AM EDT ----- The blood sugar is good at 81. The creatinine, the most important kidney function test is within normal limits. The electrolytes including potassium are good. All liver function tests are within normal limits Cholesterol numbers with advanced lipid testing have a total LDL particle number that is more elevated than when last checked at 1974. The LDL C is also more elevated at 148 and this should be less than 100. The triglycerides are elevated at 163 and should be less than 150. The patient is statin intolerant after being tried on multiple statins. If she has not already spoken with the clinical pharmacists, please arrange a visit for her with Sharyn Lull to discuss the PCS K-9 inhibitors treatment The vitamin D level remains low. Please have her increase her vitamin D3 to the 2000 units daily size All thyroid function tests are within normal limits

## 2015-02-19 NOTE — Telephone Encounter (Signed)
lmtcb regarding test results. 

## 2015-02-20 ENCOUNTER — Ambulatory Visit (INDEPENDENT_AMBULATORY_CARE_PROVIDER_SITE_OTHER): Payer: Medicare Other | Admitting: *Deleted

## 2015-02-20 ENCOUNTER — Encounter: Payer: Self-pay | Admitting: *Deleted

## 2015-02-20 ENCOUNTER — Telehealth: Payer: Self-pay | Admitting: *Deleted

## 2015-02-20 VITALS — BP 122/70 | HR 64 | Ht 63.0 in | Wt 186.0 lb

## 2015-02-20 DIAGNOSIS — Z0000 Encounter for general adult medical examination without abnormal findings: Secondary | ICD-10-CM

## 2015-02-20 NOTE — Telephone Encounter (Signed)
Pt notified of results Verbalizes understanding appt scheduled with Michelle 

## 2015-02-20 NOTE — Patient Instructions (Addendum)
You have been scheduled for a mammogram on August 06, 2015 at 9 am at the mobile unit that comes to [place].  Thank you for coming in for your annual wellness visit today!     Preventive Care for Adults  A healthy lifestyle and preventive care can promote health and wellness. Preventive health guidelines for women include the following key practices.  A routine yearly physical is a good way to check with your health care provider about your health and preventive screening. It is a chance to share any concerns and updates on your health and to receive a thorough exam.  Visit your dentist for a routine exam and preventive care every 6 months. Brush your teeth twice a day and floss once a day. Good oral hygiene prevents tooth decay and gum disease.  The frequency of eye exams is based on your age, health, family medical history, use of contact lenses, and other factors. Follow your health care provider's recommendations for frequency of eye exams.  Eat a healthy diet. Foods like vegetables, fruits, whole grains, low-fat dairy products, and lean protein foods contain the nutrients you need without too many calories. Decrease your intake of foods high in solid fats, added sugars, and salt. Eat the right amount of calories for you.Get information about a proper diet from your health care provider, if necessary.  Regular physical exercise is one of the most important things you can do for your health. Most adults should get at least 150 minutes of moderate-intensity exercise (any activity that increases your heart rate and causes you to sweat) each week. In addition, most adults need muscle-strengthening exercises on 2 or more days a week.  Maintain a healthy weight. The body mass index (BMI) is a screening tool to identify possible weight problems. It provides an estimate of body fat based on height and weight. Your health care provider can find your BMI and can help you  achieve or maintain a healthy weight.For adults 20 years and older:  A BMI below 18.5 is considered underweight.  A BMI of 18.5 to 24.9 is normal.  A BMI of 25 to 29.9 is considered overweight.  A BMI of 30 and above is considered obese.  Maintain normal blood lipids and cholesterol levels by exercising and minimizing your intake of saturated fat. Eat a balanced diet with plenty of fruit and vegetables. Blood tests for lipids and cholesterol should begin at age 40 and be repeated every 5 years. If your lipid or cholesterol levels are high, you are over 50, or you are at high risk for heart disease, you may need your cholesterol levels checked more frequently.Ongoing high lipid and cholesterol levels should be treated with medicines if diet and exercise are not working.  If you smoke, find out from your health care provider how to quit. If you do not use tobacco, do not start.  Lung cancer screening is recommended for adults aged 35-80 years who are at high risk for developing lung cancer because of a history of smoking. A yearly low-dose CT scan of the lungs is recommended for people who have at least a 30-pack-year history of smoking and are a current smoker or have quit within the past 15 years. A pack year of smoking is smoking an average of 1 pack of cigarettes a day for 1 year (for example: 1 pack a day for 30 years or 2 packs a day for 15 years). Yearly screening should continue until the smoker  has stopped smoking for at least 15 years. Yearly screening should be stopped for people who develop a health problem that would prevent them from having lung cancer treatment.  If you are pregnant, do not drink alcohol. If you are breastfeeding, be very cautious about drinking alcohol. If you are not pregnant and choose to drink alcohol, do not have more than 1 drink per day. One drink is considered to be 12 ounces (355 mL) of beer, 5 ounces (148 mL) of wine, or 1.5 ounces (44 mL) of liquor.  Avoid  use of street drugs. Do not share needles with anyone. Ask for help if you need support or instructions about stopping the use of drugs.  High blood pressure causes heart disease and increases the risk of stroke. Your blood pressure should be checked at least every 1 to 2 years. Ongoing high blood pressure should be treated with medicines if weight loss and exercise do not work.  If you are 97-44 years old, ask your health care provider if you should take aspirin to prevent strokes.  Diabetes screening involves taking a blood sample to check your fasting blood sugar level. This should be done once every 3 years, after age 61, if you are within normal weight and without risk factors for diabetes. Testing should be considered at a younger age or be carried out more frequently if you are overweight and have at least 1 risk factor for diabetes.  Breast cancer screening is essential preventive care for women. You should practice "breast self-awareness." This means understanding the normal appearance and feel of your breasts and may include breast self-examination. Any changes detected, no matter how small, should be reported to a health care provider. Women in their 36s and 30s should have a clinical breast exam (CBE) by a health care provider as part of a regular health exam every 1 to 3 years. After age 20, women should have a CBE every year. Starting at age 19, women should consider having a mammogram (breast X-ray test) every year. Women who have a family history of breast cancer should talk to their health care provider about genetic screening. Women at a high risk of breast cancer should talk to their health care providers about having an MRI and a mammogram every year.  Breast cancer gene (BRCA)-related cancer risk assessment is recommended for women who have family members with BRCA-related cancers. BRCA-related cancers include breast, ovarian, tubal, and peritoneal cancers. Having family members with  these cancers may be associated with an increased risk for harmful changes (mutations) in the breast cancer genes BRCA1 and BRCA2. Results of the assessment will determine the need for genetic counseling and BRCA1 and BRCA2 testing.  Routine pelvic exams to screen for cancer are no longer recommended for nonpregnant women who are considered low risk for cancer of the pelvic organs (ovaries, uterus, and vagina) and who do not have symptoms. Ask your health care provider if a screening pelvic exam is right for you.  If you have had past treatment for cervical cancer or a condition that could lead to cancer, you need Pap tests and screening for cancer for at least 20 years after your treatment. If Pap tests have been discontinued, your risk factors (such as having a new sexual partner) need to be reassessed to determine if screening should be resumed. Some women have medical problems that increase the chance of getting cervical cancer. In these cases, your health care provider may recommend more frequent screening and Pap  tests.  The HPV test is an additional test that may be used for cervical cancer screening. The HPV test looks for the virus that can cause the cell changes on the cervix. The cells collected during the Pap test can be tested for HPV. The HPV test could be used to screen women aged 80 years and older, and should be used in women of any age who have unclear Pap test results. After the age of 90, women should have HPV testing at the same frequency as a Pap test.  Colorectal cancer can be detected and often prevented. Most routine colorectal cancer screening begins at the age of 97 years and continues through age 19 years. However, your health care provider may recommend screening at an earlier age if you have risk factors for colon cancer. On a yearly basis, your health care provider may provide home test kits to check for hidden blood in the stool. Use of a small camera at the end of a tube, to  directly examine the colon (sigmoidoscopy or colonoscopy), can detect the earliest forms of colorectal cancer. Talk to your health care provider about this at age 74, when routine screening begins. Direct exam of the colon should be repeated every 5-10 years through age 78 years, unless early forms of pre-cancerous polyps or small growths are found.  People who are at an increased risk for hepatitis B should be screened for this virus. You are considered at high risk for hepatitis B if:  You were born in a country where hepatitis B occurs often. Talk with your health care provider about which countries are considered high risk.  Your parents were born in a high-risk country and you have not received a shot to protect against hepatitis B (hepatitis B vaccine).  You have HIV or AIDS.  You use needles to inject street drugs.  You live with, or have sex with, someone who has hepatitis B.  You get hemodialysis treatment.  You take certain medicines for conditions like cancer, organ transplantation, and autoimmune conditions.  Hepatitis C blood testing is recommended for all people born from [birth year] through 1965 and any individual with known risks for hepatitis C.  Practice safe sex. Use condoms and avoid high-risk sexual practices to reduce the spread of sexually transmitted infections (STIs). STIs include gonorrhea, chlamydia, syphilis, trichomonas, herpes, HPV, and human immunodeficiency virus (HIV). Herpes, HIV, and HPV are viral illnesses that have no cure. They can result in disability, cancer, and death.  You should be screened for sexually transmitted illnesses (STIs) including gonorrhea and chlamydia if:  You are sexually active and are younger than 24 years.  You are older than 24 years and your health care provider tells you that you are at risk for this type of infection.  Your sexual activity has changed since you were last screened and you are at an increased risk for chlamydia or  gonorrhea. Ask your health care provider if you are at risk.  If you are at risk of being infected with HIV, it is recommended that you take a prescription medicine daily to prevent HIV infection. This is called preexposure prophylaxis (PrEP). You are considered at risk if:  You are a heterosexual woman, are sexually active, and are at increased risk for HIV infection.  You take drugs by injection.  You are sexually active with a partner who has HIV.  Talk with your health care provider about whether you are at high risk of being infected with HIV.  If you choose to begin PrEP, you should first be tested for HIV. You should then be tested every 3 months for as long as you are taking PrEP.  Osteoporosis is a disease in which the bones lose minerals and strength with aging. This can result in serious bone fractures or breaks. The risk of osteoporosis can be identified using a bone density scan. Women ages 74 years and over and women at risk for fractures or osteoporosis should discuss screening with their health care providers. Ask your health care provider whether you should take a calcium supplement or vitamin D to reduce the rate of osteoporosis.  Menopause can be associated with physical symptoms and risks. Hormone replacement therapy is available to decrease symptoms and risks. You should talk to your health care provider about whether hormone replacement therapy is right for you.  Use sunscreen. Apply sunscreen liberally and repeatedly throughout the day. You should seek shade when your shadow is shorter than you. Protect yourself by wearing long sleeves, pants, a wide-brimmed hat, and sunglasses year round, whenever you are outdoors.  Once a month, do a whole body skin exam, using a mirror to look at the skin on your back. Tell your health care provider of new moles, moles that have irregular borders, moles that are larger than a pencil eraser, or moles that have changed in shape or  color.  Stay current with required vaccines (immunizations).  Influenza vaccine. All adults should be immunized every year.  Tetanus, diphtheria, and acellular pertussis (Td, Tdap) vaccine. Pregnant women should receive 1 dose of Tdap vaccine during each pregnancy. The dose should be obtained regardless of the length of time since the last dose. Immunization is preferred during the 27th-36th week of gestation. An adult who has not previously received Tdap or who does not know her vaccine status should receive 1 dose of Tdap. This initial dose should be followed by tetanus and diphtheria toxoids (Td) booster doses every 10 years. Adults with an unknown or incomplete history of completing a 3-dose immunization series with Td-containing vaccines should begin or complete a primary immunization series including a Tdap dose. Adults should receive a Td booster every 10 years.  Varicella vaccine. An adult without evidence of immunity to varicella should receive 2 doses or a second dose if she has previously received 1 dose. Pregnant females who do not have evidence of immunity should receive the first dose after pregnancy. This first dose should be obtained before leaving the health care facility. The second dose should be obtained 4-8 weeks after the first dose.  Human papillomavirus (HPV) vaccine. Females aged 13-26 years who have not received the vaccine previously should obtain the 3-dose series. The vaccine is not recommended for use in pregnant females. However, pregnancy testing is not needed before receiving a dose. If a female is found to be pregnant after receiving a dose, no treatment is needed. In that case, the remaining doses should be delayed until after the pregnancy. Immunization is recommended for any person with an immunocompromised condition through the age of 25 years if she did not get any or all doses earlier. During the 3-dose series, the second dose should be obtained 4-8 weeks after the  first dose. The third dose should be obtained 24 weeks after the first dose and 16 weeks after the second dose.  Zoster vaccine. One dose is recommended for adults aged 7 years or older unless certain conditions are present.  Measles, mumps, and rubella (MMR) vaccine. Adults  born before [birth year] generally are considered immune to measles and mumps. Adults born in [birth year] or later should have 1 or more doses of MMR vaccine unless there is a contraindication to the vaccine or there is laboratory evidence of immunity to each of the three diseases. A routine second dose of MMR vaccine should be obtained at least 28 days after the first dose for students attending postsecondary schools, health care workers, or international travelers. People who received inactivated measles vaccine or an unknown type of measles vaccine during 1963-1967 should receive 2 doses of MMR vaccine. People who received inactivated mumps vaccine or an unknown type of mumps vaccine before 1979 and are at high risk for mumps infection should consider immunization with 2 doses of MMR vaccine. For females of childbearing age, rubella immunity should be determined. If there is no evidence of immunity, females who are not pregnant should be vaccinated. If there is no evidence of immunity, females who are pregnant should delay immunization until after pregnancy. Unvaccinated health care workers born before 25 who lack laboratory evidence of measles, mumps, or rubella immunity or laboratory confirmation of disease should consider measles and mumps immunization with 2 doses of MMR vaccine or rubella immunization with 1 dose of MMR vaccine.  Pneumococcal 13-valent conjugate (PCV13) vaccine. When indicated, a person who is uncertain of her immunization history and has no record of immunization should receive the PCV13 vaccine. An adult aged 17 years or older who has certain medical conditions and has not been previously immunized should receive 1 dose of  PCV13 vaccine. This PCV13 should be followed with a dose of pneumococcal polysaccharide (PPSV23) vaccine. The PPSV23 vaccine dose should be obtained at least 8 weeks after the dose of PCV13 vaccine. An adult aged 34 years or older who has certain medical conditions and previously received 1 or more doses of PPSV23 vaccine should receive 1 dose of PCV13. The PCV13 vaccine dose should be obtained 1 or more years after the last PPSV23 vaccine dose.  Pneumococcal polysaccharide (PPSV23) vaccine. When PCV13 is also indicated, PCV13 should be obtained first. All adults aged 40 years and older should be immunized. An adult younger than age 11 years who has certain medical conditions should be immunized. Any person who resides in a nursing home or long-term care facility should be immunized. An adult smoker should be immunized. People with an immunocompromised condition and certain other conditions should receive both PCV13 and PPSV23 vaccines. People with human immunodeficiency virus (HIV) infection should be immunized as soon as possible after diagnosis. Immunization during chemotherapy or radiation therapy should be avoided. Routine use of PPSV23 vaccine is not recommended for American Indians, [place] Natives, or people younger than 65 years unless there are medical conditions that require PPSV23 vaccine. When indicated, people who have unknown immunization and have no record of immunization should receive PPSV23 vaccine. One-time revaccination 5 years after the first dose of PPSV23 is recommended for people aged 19-64 years who have chronic kidney failure, nephrotic syndrome, asplenia, or immunocompromised conditions. People who received 1-2 doses of PPSV23 before age 34 years should receive another dose of PPSV23 vaccine at age 13 years or later if at least 5 years have passed since the previous dose. Doses of PPSV23 are not needed for people immunized with PPSV23 at or after age 42 years.  Meningococcal vaccine.  Adults with asplenia or persistent complement component deficiencies should receive 2 doses of quadrivalent meningococcal conjugate (MenACWY-D) vaccine. The doses should be obtained at least  2 months apart. Microbiologists working with certain meningococcal bacteria, [employer] recruits, people at risk during an outbreak, and people who travel to or live in countries with a high rate of meningitis should be immunized. A first-year college student up through age 48 years who is living in a residence hall should receive a dose if she did not receive a dose on or after her 16th birthday. Adults who have certain high-risk conditions should receive one or more doses of vaccine.  Hepatitis A vaccine. Adults who wish to be protected from this disease, have certain high-risk conditions, work with hepatitis A-infected animals, work in hepatitis A research labs, or travel to or work in countries with a high rate of hepatitis A should be immunized. Adults who were previously unvaccinated and who anticipate close contact with an international adoptee during the first 60 days after arrival in the [place] from a country with a high rate of hepatitis A should be immunized.  Hepatitis B vaccine. Adults who wish to be protected from this disease, have certain high-risk conditions, may be exposed to blood or other infectious body fluids, are household contacts or sex partners of hepatitis B positive people, are clients or workers in certain care facilities, or travel to or work in countries with a high rate of hepatitis B should be immunized.  Haemophilus influenzae type b (Hib) vaccine. A previously unvaccinated person with asplenia or sickle cell disease or having a scheduled splenectomy should receive 1 dose of Hib vaccine. Regardless of previous immunization, a recipient of a hematopoietic stem cell transplant should receive a 3-dose series 6-12 months after her successful transplant. Hib vaccine is not recommended for  adults with HIV infection. Preventive Services / Frequency Ages 67 to 8 years  Blood pressure check.** / Every 1 to 2 years.  Lipid and cholesterol check.** / Every 5 years beginning at age 10.  Clinical breast exam.** / Every 3 years for women in their 49s and 4s.  BRCA-related cancer risk assessment.** / For women who have family members with a BRCA-related cancer (breast, ovarian, tubal, or peritoneal cancers).  Pap test.** / Every 2 years from ages 37 through 53. Every 3 years starting at age 15 through age 33 or 27 with a history of 3 consecutive normal Pap tests.  HPV screening.** / Every 3 years from ages 81 through ages 19 to 31 with a history of 3 consecutive normal Pap tests.  Hepatitis C blood test.** / For any individual with known risks for hepatitis C.  Skin self-exam. / Monthly.  Influenza vaccine. / Every year.  Tetanus, diphtheria, and acellular pertussis (Tdap, Td) vaccine.** / Consult your health care provider. Pregnant women should receive 1 dose of Tdap vaccine during each pregnancy. 1 dose of Td every 10 years.  Varicella vaccine.** / Consult your health care provider. Pregnant females who do not have evidence of immunity should receive the first dose after pregnancy.  HPV vaccine. / 3 doses over 6 months, if 37 and younger. The vaccine is not recommended for use in pregnant females. However, pregnancy testing is not needed before receiving a dose.  Measles, mumps, rubella (MMR) vaccine.** / You need at least 1 dose of MMR if you were born in 1957 or later. You may also need a 2nd dose. For females of childbearing age, rubella immunity should be determined. If there is no evidence of immunity, females who are not pregnant should be vaccinated. If there is no evidence of immunity, females who are  pregnant should delay immunization until after pregnancy.  Pneumococcal 13-valent conjugate (PCV13) vaccine.** / Consult your health care provider.  Pneumococcal  polysaccharide (PPSV23) vaccine.** / 1 to 2 doses if you smoke cigarettes or if you have certain conditions.  Meningococcal vaccine.** / 1 dose if you are age 32 to 44 years and a Market researcher living in a residence hall, or have one of several medical conditions, you need to get vaccinated against meningococcal disease. You may also need additional booster doses.  Hepatitis A vaccine.** / Consult your health care provider.  Hepatitis B vaccine.** / Consult your health care provider.  Haemophilus influenzae type b (Hib) vaccine.** / Consult your health care provider. Ages 41 to 71 years  Blood pressure check.** / Every 1 to 2 years.  Lipid and cholesterol check.** / Every 5 years beginning at age 83 years.  Lung cancer screening. / Every year if you are aged 23-80 years and have a 30-pack-year history of smoking and currently smoke or have quit within the past 15 years. Yearly screening is stopped once you have quit smoking for at least 15 years or develop a health problem that would prevent you from having lung cancer treatment.  Clinical breast exam.** / Every year after age 25 years.  BRCA-related cancer risk assessment.** / For women who have family members with a BRCA-related cancer (breast, ovarian, tubal, or peritoneal cancers).  Mammogram.** / Every year beginning at age 35 years and continuing for as long as you are in good health. Consult with your health care provider.  Pap test.** / Every 3 years starting at age 13 years through age 82 or 49 years with a history of 3 consecutive normal Pap tests.  HPV screening.** / Every 3 years from ages 24 years through ages 43 to 98 years with a history of 3 consecutive normal Pap tests.  Fecal occult blood test (FOBT) of stool. / Every year beginning at age 93 years and continuing until age 67 years. You may not need to do this test if you get a colonoscopy every 10 years.  Flexible sigmoidoscopy or colonoscopy.** / Every 5  years for a flexible sigmoidoscopy or every 10 years for a colonoscopy beginning at age 47 years and continuing until age 61 years.  Hepatitis C blood test.** / For all people born from [birth year] through 1963 and any individual with known risks for hepatitis C.  Skin self-exam. / Monthly.  Influenza vaccine. / Every year.  Tetanus, diphtheria, and acellular pertussis (Tdap/Td) vaccine.** / Consult your health care provider. Pregnant women should receive 1 dose of Tdap vaccine during each pregnancy. 1 dose of Td every 10 years.  Varicella vaccine.** / Consult your health care provider. Pregnant females who do not have evidence of immunity should receive the first dose after pregnancy.  Zoster vaccine.** / 1 dose for adults aged 2 years or older.  Measles, mumps, rubella (MMR) vaccine.** / You need at least 1 dose of MMR if you were born in 1955 or later. You may also need a 2nd dose. For females of childbearing age, rubella immunity should be determined. If there is no evidence of immunity, females who are not pregnant should be vaccinated. If there is no evidence of immunity, females who are pregnant should delay immunization until after pregnancy.  Pneumococcal 13-valent conjugate (PCV13) vaccine.** / Consult your health care provider.  Pneumococcal polysaccharide (PPSV23) vaccine.** / 1 to 2 doses if you smoke cigarettes or if you have certain conditions.  Meningococcal vaccine.** / Consult your health care provider.  Hepatitis A vaccine.** / Consult your health care provider.  Hepatitis B vaccine.** / Consult your health care provider.  Haemophilus influenzae type b (Hib) vaccine.** / Consult your health care provider. Ages 65 years and over  Blood pressure check.** / Every 1 to 2 years.  Lipid and cholesterol check.** / Every 5 years beginning at age 84 years.  Lung cancer screening. / Every year if you are aged 44-80 years and have a 30-pack-year history of smoking and currently  smoke or have quit within the past 15 years. Yearly screening is stopped once you have quit smoking for at least 15 years or develop a health problem that would prevent you from having lung cancer treatment.  Clinical breast exam.** / Every year after age 45 years.  BRCA-related cancer risk assessment.** / For women who have family members with a BRCA-related cancer (breast, ovarian, tubal, or peritoneal cancers).  Mammogram.** / Every year beginning at age 44 years and continuing for as long as you are in good health. Consult with your health care provider.  Pap test.** / Every 3 years starting at age 27 years through age 51 or 9 years with 3 consecutive normal Pap tests. Testing can be stopped between 65 and 70 years with 3 consecutive normal Pap tests and no abnormal Pap or HPV tests in the past 10 years.  HPV screening.** / Every 3 years from ages 32 years through ages 34 or 33 years with a history of 3 consecutive normal Pap tests. Testing can be stopped between 65 and 70 years with 3 consecutive normal Pap tests and no abnormal Pap or HPV tests in the past 10 years.  Fecal occult blood test (FOBT) of stool. / Every year beginning at age 77 years and continuing until age 26 years. You may not need to do this test if you get a colonoscopy every 10 years.  Flexible sigmoidoscopy or colonoscopy.** / Every 5 years for a flexible sigmoidoscopy or every 10 years for a colonoscopy beginning at age 3 years and continuing until age 49 years.  Hepatitis C blood test.** / For all people born from [birth year] through 1963 and any individual with known risks for hepatitis C.  Osteoporosis screening.** / A one-time screening for women ages 53 years and over and women at risk for fractures or osteoporosis.  Skin self-exam. / Monthly.  Influenza vaccine. / Every year.  Tetanus, diphtheria, and acellular pertussis (Tdap/Td) vaccine.** / 1 dose of Td every 10 years.  Varicella vaccine.** / Consult your  health care provider.  Zoster vaccine.** / 1 dose for adults aged 7 years or older.  Pneumococcal 13-valent conjugate (PCV13) vaccine.** / Consult your health care provider.  Pneumococcal polysaccharide (PPSV23) vaccine.** / 1 dose for all adults aged 71 years and older.  Meningococcal vaccine.** / Consult your health care provider.  Hepatitis A vaccine.** / Consult your health care provider.  Hepatitis B vaccine.** / Consult your health care provider.  Haemophilus influenzae type b (Hib) vaccine.** / Consult your health care provider. ** Family history and personal history of risk and conditions may change your health care provider's recommendations. Document Released: 11/18/2001 Document Revised: 02/06/2014 Document Reviewed: 02/17/2011 [facility] Patient Information 2015 [place], [place]. This information is not intended to replace advice given to you by your health care provider. Make sure you discuss any questions you have with your health care provider.

## 2015-02-20 NOTE — Progress Notes (Signed)
Subjective:   Nancy Mccormick is a 81 y.o. female who presents for an Initial Medicare Annual Wellness Visit.  She is doing well today with no complaints.   She lives at home with her husband, who she is a caregiver for as he is disabled.  They have 3 children, and 3 grandchildren.  One of their sons lives at home with them.  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  She is very involved with her church as well.    Cardiac Risk Factors include: advanced age (>83men, >25 women);dyslipidemia;hypertension      Objective:    Today's Vitals   02/20/15 1522  BP: 122/70  Pulse: 64  Height: 5\' 3"  (1.6 m)  Weight: 186 lb (84.369 kg)    Current Medications (verified) Outpatient Encounter Prescriptions as of 02/20/2015  Medication Sig  . aspirin 81 MG tablet Take 81 mg by mouth at bedtime.   . Calcium Carbonate-Vitamin D (CALCIUM + D PO) Take 1 tablet by mouth at bedtime.  Marland Kitchen diltiazem (CARDIZEM CD) 120 MG 24 hr capsule TAKE ONE (1) CAPSULE EACH DAY  . lisinopril-hydrochlorothiazide (PRINZIDE,ZESTORETIC) 10-12.5 MG per tablet TAKE ONE (1) TABLET EACH DAY  . nitroGLYCERIN (NITROSTAT) 0.4 MG SL tablet Place 1 tablet (0.4 mg total) under the tongue as needed.  Marland Kitchen omega-3 acid ethyl esters (LOVAZA) 1 G capsule TAKE ONE CAPSULE BY MOUTH TWICE A DAY  . Cholecalciferol (VITAMIN D3) 1000 UNITS tablet Take 2,000 Units by mouth at bedtime.   . potassium chloride (K-DUR) 10 MEQ tablet TAKE ONE (1) TABLET EACH DAY   No facility-administered encounter medications on file as of 02/20/2015.    Allergies (verified) Atorvastatin; Crestor; Livalo; and Shellfish allergy   History: Past Medical History  Diagnosis Date  . Hypertension   . Hyperlipidemia   . Osteoporosis   . Atrial fibrillation   . Wears glasses   . Arthritis     back, left shoulder, and fingers   Past Surgical History  Procedure Laterality Date    . Vaginal hysterectomy    . Cataract extraction    . Open reduction internal fixation (orif) distal radial fracture Right 12/28/2012    Procedure: OPEN REDUCTION INTERNAL FIXATION (ORIF) DISTAL RADIAL FRACTURE;  Surgeon: Hessie Dibble, MD;  Location: [facility];  Service: Orthopedics;  Laterality: Right;   Family History  Problem Relation Age of Onset  . Stroke Mother 46  . Anemia Mother   . Heart disease Mother   . Stroke Father 23  . Stroke Sister   . Stroke Brother    Social History   Occupational History  . Retired    Social History Main Topics  . Smoking status: Former Smoker    Types: Cigarettes    Quit date: 12/17/1986  . Smokeless tobacco: Never Used     Comment: Smoke briefly in the distant past  . Alcohol Use: No  . Drug Use: No  . Sexual Activity: Not Currently    Tobacco Counseling Counseling given: No   Activities of Daily Living In your present state of health, do you have any difficulty performing the following activities: 02/20/2015  Hearing? N  Vision? N  Difficulty concentrating or making decisions? N  Walking or climbing stairs? N  Dressing or bathing? N  Doing errands, shopping? N  Preparing Food and eating ? N  Using the Toilet? N  In the past six months, have you accidently leaked urine? Y  Do you have problems with loss of bowel control? N  Managing your Medications? N  Managing your Finances? N  Housekeeping or managing your Housekeeping? N    Immunizations and Health Maintenance Immunization History  Administered Date(s) Administered  . Pneumococcal Conjugate-13 12/08/2013   There are no preventive care reminders to display for this patient.  Patient Care Team: Chipper Herb, MD as PCP - General (Family Medicine)       Assessment:   This is a routine wellness examination for Nancy Mccormick.   Hearing/Vision screen History of bilateral cataract extraction 25 years ago- wears glasses for near sightedness  She is  seen by Dr. Truman Hayward at [facility] in [place], [place]- she goes yearly for eye exams No hearing deficit noted   Dietary issues and exercise activities discussed: Current Exercise Habits:: Home exercise routine, Type of exercise: walking, Time (Minutes): 40, Frequency (Times/Week): 3, Weekly Exercise (Minutes/Week): 120, Intensity: Moderate   Goals    None    Continue walking at least 3 days per week  Depression Screen PHQ 2/9 Scores 02/20/2015 02/16/2015 10/17/2014 09/19/2014 06/20/2014  PHQ - 2 Score 0 0 0 0 0    Fall Risk Fall Risk  02/20/2015 02/16/2015 10/17/2014 09/19/2014 06/20/2014  Falls in the past year? No No No No No  Number falls in past yr: - - - - -  Injury with Fall? - - - - -    Cognitive Function: MMSE - Mini Mental State Exam 02/20/2015  Not completed: Refused  Orientation to time 4  Orientation to Place 5  Registration 3  Attention/ Calculation 5  Recall 3  Language- name 2 objects 2  Language- repeat 1  Language- follow 3 step command 3  Language- read & follow direction 1  Write a sentence 1  Copy design 1  Total score 29    Screening Tests Health Maintenance  Topic Date Due  . PNA vac Low Risk Adult (2 of 2 - PPSV23) 08/19/2015 (Originally 12/09/2014)  . INFLUENZA VACCINE  05/07/2015  . TETANUS/TDAP  09/05/2016  . PAP SMEAR  09/19/2016  . COLONOSCOPY  02/03/2022  . DEXA SCAN  Completed  . ZOSTAVAX  Completed  Mammogram scheduled for 08/06/15 at [facility]:    Continue healthy exercise and diet choices.   Please go for your mammogram on 08/06/15  During the course of the visit, Nancy Mccormick was educated and counseled about the following appropriate screening and preventive services:   Vaccines to include Pneumoccal, Influenza, Td, Zostavax- up to date  Electrocardiogram- last done 12/08/13 - recommended at next visit  Cardiovascular disease screening- lipids screened 02/16/15  Colorectal cancer screening- up to date  Bone density screening- up  to date  Diabetes screening- glucose check 02/16/15  Glaucoma screening- up to date   Mammography/PAP  Nutrition counseling  Patient Instructions (the written plan) were given to the patient.    WYATT, AMY M, RN   02/20/2015      I have reviewed and agree with the above AWV documentation.  [name] Fraise, M.D.

## 2015-02-20 NOTE — Telephone Encounter (Signed)
-----   Message from Chipper Herb, MD sent at 02/18/2015 10:34 AM EDT ----- The blood sugar is good at 81. The creatinine, the most important kidney function test is within normal limits. The electrolytes including potassium are good. All liver function tests are within normal limits Cholesterol numbers with advanced lipid testing have a total LDL particle number that is more elevated than when last checked at 1974. The LDL C is also more elevated at 148 and this should be less than 100. The triglycerides are elevated at 163 and should be less than 150. The patient is statin intolerant after being tried on multiple statins. If she has not already spoken with the clinical pharmacists, please arrange a visit for her with Sharyn Lull to discuss the PCS K-9 inhibitors treatment The vitamin D level remains low. Please have her increase her vitamin D3 to the 2000 units daily size All thyroid function tests are within normal limits

## 2015-04-18 ENCOUNTER — Other Ambulatory Visit: Payer: Self-pay | Admitting: Nurse Practitioner

## 2015-04-18 ENCOUNTER — Other Ambulatory Visit: Payer: Self-pay | Admitting: Family Medicine

## 2015-05-29 ENCOUNTER — Ambulatory Visit (INDEPENDENT_AMBULATORY_CARE_PROVIDER_SITE_OTHER): Payer: Medicare Other | Admitting: Family Medicine

## 2015-05-29 ENCOUNTER — Encounter: Payer: Self-pay | Admitting: Family Medicine

## 2015-05-29 ENCOUNTER — Ambulatory Visit (INDEPENDENT_AMBULATORY_CARE_PROVIDER_SITE_OTHER): Payer: Medicare Other

## 2015-05-29 VITALS — BP 155/101 | HR 67 | Temp 97.9°F | Ht 63.0 in

## 2015-05-29 DIAGNOSIS — M25561 Pain in right knee: Secondary | ICD-10-CM | POA: Diagnosis not present

## 2015-05-29 MED ORDER — HYDROCODONE-ACETAMINOPHEN 5-325 MG PO TABS: 1.0000 | ORAL_TABLET | Freq: Four times a day (QID) | ORAL | Status: DC | PRN

## 2015-05-29 NOTE — Progress Notes (Signed)
Subjective:    Patient ID: Nancy Mccormick, female    DOB: 16-May-1934, 81 y.o.   MRN: [id number]  HPI Patient here today for right knee pain. This started last night when her knee "just gave out". She is accompanied today by her son.      Patient Active Problem List   Diagnosis Date Noted  . Osteopenia 04/19/2014  . Palpitations 11/04/2013  . Arrhythmia 11/04/2013  . FIBRILLATION, ATRIAL 09/11/2010  . HYPERLIPIDEMIA 09/10/2010  . HYPERTENSION 09/10/2010   Outpatient Encounter Prescriptions as of 05/29/2015  Medication Sig  . aspirin 81 MG tablet Take 81 mg by mouth at bedtime.   . Calcium Carbonate-Vitamin D (CALCIUM + D PO) Take 1 tablet by mouth at bedtime.  . Cholecalciferol (VITAMIN D3) 1000 UNITS tablet Take 2,000 Units by mouth at bedtime.   Marland Kitchen diltiazem (CARDIZEM CD) 120 MG 24 hr capsule TAKE ONE (1) CAPSULE EACH DAY  . lisinopril-hydrochlorothiazide (PRINZIDE,ZESTORETIC) 10-12.5 MG per tablet TAKE ONE (1) TABLET EACH DAY  . omega-3 acid ethyl esters (LOVAZA) 1 G capsule TAKE ONE CAPSULE BY MOUTH TWICE A DAY  . potassium chloride (K-DUR) 10 MEQ tablet TAKE ONE (1) TABLET EACH DAY  . nitroGLYCERIN (NITROSTAT) 0.4 MG SL tablet Place 1 tablet (0.4 mg total) under the tongue as needed. (Patient not taking: Reported on 05/29/2015)   No facility-administered encounter medications on file as of 05/29/2015.     Review of Systems  Constitutional: Negative.   HENT: Negative.   Eyes: Negative.   Respiratory: Negative.   Cardiovascular: Negative.   Gastrointestinal: Negative.   Endocrine: Negative.   Genitourinary: Negative.   Musculoskeletal: Positive for arthralgias (right knee pain).  Skin: Negative.   Allergic/Immunologic: Negative.   Neurological: Negative.   Hematological: Negative.   Psychiatric/Behavioral: Negative.        Objective:   Physical Exam  Constitutional: She is oriented to person, place, and time. She appears well-developed and well-nourished. She  appears distressed.  Musculoskeletal:  Difficult to extend the knee and tender at the medial joint line and anterior to this. She was in a wheelchair and she was being examined.  Neurological: She is alert and oriented to person, place, and time.  Skin: Skin is warm and dry. No rash noted. No erythema.  Psychiatric: She has a normal mood and affect. Her behavior is normal. Judgment and thought content normal.  Nursing note and vitals reviewed.  BP 155/101 mmHg  Pulse 67  Temp(Src) 97.9 F (36.6 C) (Oral)  Ht 5\' 3"  (1.6 m)  Wt   [facility] reading (PRIMARY) by  Dr.Moore-abnormal right knee compared to the left with increased joint space compared to the left.                               An appointment has been arranged for the patient to see Dr. Ricki Rodriguez tomorrow      Assessment & Plan:  1. Right knee pain - DG Knee 1-2 Views Right; Future  Meds ordered this encounter  Medications  . HYDROcodone-acetaminophen (NORCO/VICODIN) 5-325 MG per tablet    Sig: Take 1 tablet by mouth every 6 (six) hours as needed for moderate pain.    Dispense:  30 tablet    Refill:  0     Patient Instructions  Avoid weightbearing as much as possible We will arrange for you to have an appointment with orthopedic surgeon hopefully today for follow-up as you may  need an MRI for further evaluation   Arrie Senate MD

## 2015-05-29 NOTE — Patient Instructions (Signed)
Avoid weightbearing as much as possible We will arrange for you to have an appointment with orthopedic surgeon hopefully today for follow-up as you may need an MRI for further evaluation

## 2015-06-12 ENCOUNTER — Other Ambulatory Visit: Payer: Self-pay | Admitting: Family Medicine

## 2015-06-13 NOTE — Progress Notes (Signed)
Please put orders in Epic surgery 07-04-15 pre op 06-26-15

## 2015-06-14 ENCOUNTER — Ambulatory Visit: Payer: Self-pay | Admitting: Orthopedic Surgery

## 2015-06-15 ENCOUNTER — Ambulatory Visit: Payer: Medicare Other | Admitting: Family Medicine

## 2015-06-18 ENCOUNTER — Other Ambulatory Visit: Payer: Self-pay | Admitting: Family Medicine

## 2015-06-22 NOTE — Patient Instructions (Addendum)
Nancy Mccormick  06/22/2015   Your procedure is scheduled on:     07/04/2015    Report to [facility] Main  Entrance take [place]  elevators to 3rd floor to  [place] at     [address] AM.  Call this number if you have problems the morning of surgery [phone]   Remember: ONLY 1 PERSON MAY GO WITH YOU TO SHORT STAY TO GET  READY MORNING OF YOUR SURGERY.  Do not eat food or drink liquids :After Midnight.     Take these medicines the morning of surgery with A SIP OF WATER:   None                                You may not have any metal on your body including hair pins and              piercings  Do not wear jewelry, make-up, lotions, powders or perfumes, deodorant             Do not wear nail polish.  Do not shave  48 hours prior to surgery.                Do not bring valuables to the hospital. [place].  Contacts, dentures or bridgework may not be worn into surgery.      Patients discharged the day of surgery will not be allowed to drive home.  Name and phone number of your driver:  Special Instructions: coughing and deep breathing exercises, leg exercises              Please read over the following fact sheets you were given: _____________________________________________________________________             [facility] - Preparing for Surgery Before surgery, you can play an important role.  Because skin is not sterile, your skin needs to be as free of germs as possible.  You can reduce the number of germs on your skin by washing with CHG (chlorahexidine gluconate) soap before surgery.  CHG is an antiseptic cleaner which kills germs and bonds with the skin to continue killing germs even after washing. Please DO NOT use if you have an allergy to CHG or antibacterial soaps.  If your skin becomes reddened/irritated stop using the CHG and inform your nurse when you arrive at Short Stay. Do not shave (including  legs and underarms) for at least 48 hours prior to the first CHG shower.  You may shave your face/neck. Please follow these instructions carefully:  1.  Shower with CHG Soap the night before surgery and the  morning of Surgery.  2.  If you choose to wash your hair, wash your hair first as usual with your  normal  shampoo.  3.  After you shampoo, rinse your hair and body thoroughly to remove the  shampoo.                           4.  Use CHG as you would any other liquid soap.  You can apply chg directly  to the skin and wash  Gently with a scrungie or clean washcloth.  5.  Apply the CHG Soap to your body ONLY FROM THE NECK DOWN.   Do not use on face/ open                           Wound or open sores. Avoid contact with eyes, ears mouth and genitals (private parts).                       Wash face,  Genitals (private parts) with your normal soap.             6.  Wash thoroughly, paying special attention to the area where your surgery  will be performed.  7.  Thoroughly rinse your body with warm water from the neck down.  8.  DO NOT shower/wash with your normal soap after using and rinsing off  the CHG Soap.                9.  Pat yourself dry with a clean towel.            10.  Wear clean pajamas.            11.  Place clean sheets on your bed the night of your first shower and do not  sleep with pets. Day of Surgery : Do not apply any lotions/deodorants the morning of surgery.  Please wear clean clothes to the hospital/surgery center.  FAILURE TO FOLLOW THESE INSTRUCTIONS MAY RESULT IN THE CANCELLATION OF YOUR SURGERY PATIENT SIGNATURE_________________________________  NURSE SIGNATURE__________________________________  ________________________________________________________________________

## 2015-06-26 ENCOUNTER — Encounter (HOSPITAL_COMMUNITY)
Admission: RE | Admit: 2015-06-26 | Discharge: 2015-06-26 | Disposition: A | Discharge status: Home / Self Care | Payer: Medicare Other | Source: Ambulatory Visit | Attending: Orthopedic Surgery | Admitting: Orthopedic Surgery

## 2015-06-26 ENCOUNTER — Encounter (HOSPITAL_COMMUNITY): Payer: Self-pay

## 2015-06-26 DIAGNOSIS — Z01818 Encounter for other preprocedural examination: Secondary | ICD-10-CM | POA: Insufficient documentation

## 2015-06-26 LAB — CBC
HCT: 42.4 % (ref 36.0–46.0)
Hemoglobin: 14 g/dL (ref 12.0–15.0)
MCH: 30.7 pg (ref 26.0–34.0)
MCHC: 33 g/dL (ref 30.0–36.0)
MCV: 93 fL (ref 78.0–100.0)
PLATELETS: 373 10*3/uL (ref 150–400)
RBC: 4.56 MIL/uL (ref 3.87–5.11)
RDW: 13.7 % (ref 11.5–15.5)
WBC: 9.8 10*3/uL (ref 4.0–10.5)

## 2015-06-26 LAB — BASIC METABOLIC PANEL
ANION GAP: 9 (ref 5–15)
BUN: 17 mg/dL (ref 6–20)
CALCIUM: 9.7 mg/dL (ref 8.9–10.3)
CO2: 29 mmol/L (ref 22–32)
CREATININE: 0.84 mg/dL (ref 0.44–1.00)
Chloride: 105 mmol/L (ref 101–111)
Glucose, Bld: 97 mg/dL (ref 65–99)
Potassium: 4.3 mmol/L (ref 3.5–5.1)
Sodium: 143 mmol/L (ref 135–145)

## 2015-06-26 NOTE — Progress Notes (Signed)
Final EKG done 06/26/15 in [facility]

## 2015-06-26 NOTE — Progress Notes (Signed)
LOV- 11/2013 with Dr Percival Spanish ( cardiology)

## 2015-07-03 DIAGNOSIS — S83289A Other tear of lateral meniscus, current injury, unspecified knee, initial encounter: Secondary | ICD-10-CM | POA: Diagnosis present

## 2015-07-03 DIAGNOSIS — S83249A Other tear of medial meniscus, current injury, unspecified knee, initial encounter: Secondary | ICD-10-CM

## 2015-07-03 NOTE — H&P (Signed)
CC- Nancy Mccormick is a 81 y.o. female who presents with Right knee pain.  HPI- . Knee Pain: Patient presents with knee pain involving the  right knee. Onset of the symptoms was several months ago. Inciting event: none known. Current symptoms include foreign body sensation, giving out, pain located medially and stiffness. Pain is aggravated by going up and down stairs, lateral movements and pivoting.  Patient has had prior knee problems. Evaluation to date: MRI: abnormal medial meniscal tear. Treatment to date: rest.  Past Medical History  Diagnosis Date  . Hypertension   . Hyperlipidemia   . Osteoporosis   . Atrial fibrillation   . Wears glasses   . Arthritis     back, left shoulder, and fingers  . Palpitations   . Headache     occasional     Past Surgical History  Procedure Laterality Date  . Vaginal hysterectomy    . Cataract extraction    . Open reduction internal fixation (orif) distal radial fracture Right 12/28/2012    Procedure: OPEN REDUCTION INTERNAL FIXATION (ORIF) DISTAL RADIAL FRACTURE;  Surgeon: Hessie Dibble, MD;  Location: [facility];  Service: Orthopedics;  Laterality: Right;    Prior to Admission medications   Medication Sig Start Date End Date Taking? Authorizing Provider  aspirin 81 MG tablet Take 81 mg by mouth at bedtime.    Yes Historical Provider, MD  Calcium Carbonate-Vitamin D (CALCIUM + D PO) Take 1 tablet by mouth at bedtime.   Yes Historical Provider, MD  [place] 450 MG CAPS Take 2 capsules by mouth daily.   Yes Historical Provider, MD  Cholecalciferol (VITAMIN D3) 1000 UNITS tablet Take 2,000 Units by mouth at bedtime.    Yes Historical Provider, MD  HYDROcodone-acetaminophen (NORCO/VICODIN) 5-325 MG per tablet Take 1 tablet by mouth every 6 (six) hours as needed for moderate pain. 05/29/15  Yes Chipper Herb, MD  lisinopril-hydrochlorothiazide (PRINZIDE,ZESTORETIC) 10-12.5 MG per tablet TAKE ONE (1) TABLET EACH DAY Patient  taking differently: TAKE ONE (1) TABLET EACH DAY BEDTIME 04/19/15  Yes Chipper Herb, MD  nitroGLYCERIN (NITROSTAT) 0.4 MG SL tablet Place 1 tablet (0.4 mg total) under the tongue as needed. 06/20/14  Yes Chipper Herb, MD  omega-3 acid ethyl esters (LOVAZA) 1 G capsule TAKE ONE CAPSULE BY MOUTH TWICE A DAY 04/19/15  Yes Chipper Herb, MD  potassium chloride (K-DUR) 10 MEQ tablet TAKE ONE (1) TABLET EACH DAY Patient taking differently: TAKE ONE (1) TABLET EACH DAY BEDTIME 06/12/15  Yes Chipper Herb, MD  Psyllium (NAT-RUL PSYLLIUM SEED HUSKS) 500 MG CAPS Take 2 capsules by mouth daily.   Yes Historical Provider, MD  diltiazem (CARDIZEM CD) 120 MG 24 hr capsule TAKE ONE (1) CAPSULE EACH DAY 06/18/15   Chipper Herb, MD   KNEE EXAM antalgic gait, soft tissue tenderness over medial joint line, no effusion, negative drawer sign, collateral ligaments intact  Physical Examination: General appearance - alert, well appearing, and in no distress Mental status - alert, oriented to person, place, and time Chest - clear to auscultation, no wheezes, rales or rhonchi, symmetric air entry Heart - normal rate, regular rhythm, normal S1, S2, no murmurs, rubs, clicks or gallops Abdomen - soft, nontender, nondistended, no masses or organomegaly Neurological - alert, oriented, normal speech, no focal findings or movement disorder noted   Asessment/Plan--- Right knee medial meniscal tear- - Plan left knee arthroscopy with meniscal debridement. Procedure risks and potential comps discussed with patient  who elects to proceed. Goals are decreased pain and increased function with a high likelihood of achieving both

## 2015-07-04 ENCOUNTER — Ambulatory Visit (HOSPITAL_COMMUNITY): Payer: Medicare Other | Admitting: Anesthesiology

## 2015-07-04 ENCOUNTER — Ambulatory Visit (HOSPITAL_COMMUNITY)
Admission: RE | Admit: 2015-07-04 | Discharge: 2015-07-04 | Disposition: A | Discharge status: Home / Self Care | Payer: Medicare Other | Source: Ambulatory Visit | Attending: Orthopedic Surgery | Admitting: Orthopedic Surgery

## 2015-07-04 ENCOUNTER — Encounter (HOSPITAL_COMMUNITY)
Admission: RE | Disposition: A | Discharge status: Home / Self Care | Payer: Self-pay | Source: Ambulatory Visit | Attending: Orthopedic Surgery

## 2015-07-04 ENCOUNTER — Encounter (HOSPITAL_COMMUNITY): Payer: Self-pay

## 2015-07-04 DIAGNOSIS — E785 Hyperlipidemia, unspecified: Secondary | ICD-10-CM | POA: Insufficient documentation

## 2015-07-04 DIAGNOSIS — I4891 Unspecified atrial fibrillation: Secondary | ICD-10-CM | POA: Diagnosis not present

## 2015-07-04 DIAGNOSIS — M1990 Unspecified osteoarthritis, unspecified site: Secondary | ICD-10-CM | POA: Insufficient documentation

## 2015-07-04 DIAGNOSIS — S83241A Other tear of medial meniscus, current injury, right knee, initial encounter: Secondary | ICD-10-CM | POA: Insufficient documentation

## 2015-07-04 DIAGNOSIS — X58XXXA Exposure to other specified factors, initial encounter: Secondary | ICD-10-CM | POA: Diagnosis not present

## 2015-07-04 DIAGNOSIS — S83249A Other tear of medial meniscus, current injury, unspecified knee, initial encounter: Secondary | ICD-10-CM

## 2015-07-04 DIAGNOSIS — Z87891 Personal history of nicotine dependence: Secondary | ICD-10-CM | POA: Insufficient documentation

## 2015-07-04 DIAGNOSIS — Z79899 Other long term (current) drug therapy: Secondary | ICD-10-CM | POA: Insufficient documentation

## 2015-07-04 DIAGNOSIS — E669 Obesity, unspecified: Secondary | ICD-10-CM | POA: Diagnosis not present

## 2015-07-04 DIAGNOSIS — Z6831 Body mass index (BMI) 31.0-31.9, adult: Secondary | ICD-10-CM | POA: Insufficient documentation

## 2015-07-04 DIAGNOSIS — I10 Essential (primary) hypertension: Secondary | ICD-10-CM | POA: Insufficient documentation

## 2015-07-04 DIAGNOSIS — S83289A Other tear of lateral meniscus, current injury, unspecified knee, initial encounter: Secondary | ICD-10-CM | POA: Diagnosis present

## 2015-07-04 DIAGNOSIS — Z7982 Long term (current) use of aspirin: Secondary | ICD-10-CM | POA: Diagnosis not present

## 2015-07-04 DIAGNOSIS — Z79891 Long term (current) use of opiate analgesic: Secondary | ICD-10-CM | POA: Diagnosis not present

## 2015-07-04 DIAGNOSIS — M25561 Pain in right knee: Secondary | ICD-10-CM | POA: Diagnosis present

## 2015-07-04 HISTORY — PX: KNEE ARTHROSCOPY: SHX127

## 2015-07-04 SURGERY — ARTHROSCOPY, KNEE
Anesthesia: General | Site: Knee | Laterality: Right

## 2015-07-04 MED ORDER — ONDANSETRON HCL 4 MG/2ML IJ SOLN
INTRAMUSCULAR | Status: AC
  Filled 2015-07-04: qty 2

## 2015-07-04 MED ORDER — PROPOFOL 10 MG/ML IV BOLUS
INTRAVENOUS | Status: AC
  Filled 2015-07-04: qty 20

## 2015-07-04 MED ORDER — BUPIVACAINE-EPINEPHRINE (PF) 0.25% -1:200000 IJ SOLN
INTRAMUSCULAR | Status: AC
  Filled 2015-07-04: qty 30

## 2015-07-04 MED ORDER — CHLORHEXIDINE GLUCONATE 4 % EX LIQD: 60.0000 mL | Freq: Once | CUTANEOUS | Status: DC

## 2015-07-04 MED ORDER — FENTANYL CITRATE (PF) 100 MCG/2ML IJ SOLN
INTRAMUSCULAR | Status: DC
Start: 2015-07-04 — End: 2015-07-04
  Filled 2015-07-04: qty 2

## 2015-07-04 MED ORDER — DEXAMETHASONE SODIUM PHOSPHATE 10 MG/ML IJ SOLN
INTRAMUSCULAR | Status: AC
  Filled 2015-07-04: qty 1

## 2015-07-04 MED ORDER — LACTATED RINGERS IV SOLN
INTRAVENOUS | Status: DC
  Administered 2015-07-04: 1000 mL via INTRAVENOUS
  Administered 2015-07-04: 10:00:00 via INTRAVENOUS

## 2015-07-04 MED ORDER — CEFAZOLIN SODIUM-DEXTROSE 2-3 GM-% IV SOLR
2.0000 g | INTRAVENOUS | Status: AC
  Administered 2015-07-04: 2 g via INTRAVENOUS

## 2015-07-04 MED ORDER — FENTANYL CITRATE (PF) 100 MCG/2ML IJ SOLN
25.0000 ug | INTRAMUSCULAR | Status: DC | PRN
  Administered 2015-07-04 (×4): 25 ug via INTRAVENOUS

## 2015-07-04 MED ORDER — PHENYLEPHRINE 40 MCG/ML (10ML) SYRINGE FOR IV PUSH (FOR BLOOD PRESSURE SUPPORT)
PREFILLED_SYRINGE | INTRAVENOUS | Status: AC
  Filled 2015-07-04: qty 10

## 2015-07-04 MED ORDER — CEFAZOLIN SODIUM-DEXTROSE 2-3 GM-% IV SOLR
INTRAVENOUS | Status: AC
  Filled 2015-07-04: qty 50

## 2015-07-04 MED ORDER — FENTANYL CITRATE (PF) 100 MCG/2ML IJ SOLN
INTRAMUSCULAR | Status: AC
  Filled 2015-07-04: qty 4

## 2015-07-04 MED ORDER — DEXAMETHASONE SODIUM PHOSPHATE 10 MG/ML IJ SOLN
INTRAMUSCULAR | Status: DC | PRN
  Administered 2015-07-04: 10 mg via INTRAVENOUS

## 2015-07-04 MED ORDER — LACTATED RINGERS IR SOLN
Status: DC | PRN
  Administered 2015-07-04: 6000 mL

## 2015-07-04 MED ORDER — LIDOCAINE HCL (CARDIAC) 20 MG/ML IV SOLN
INTRAVENOUS | Status: AC
  Filled 2015-07-04: qty 5

## 2015-07-04 MED ORDER — ACETAMINOPHEN 10 MG/ML IV SOLN
1000.0000 mg | Freq: Once | INTRAVENOUS | Status: AC
  Administered 2015-07-04: 1000 mg via INTRAVENOUS

## 2015-07-04 MED ORDER — HYDROCODONE-ACETAMINOPHEN 5-325 MG PO TABS: 1.0000 | ORAL_TABLET | ORAL | Status: DC | PRN

## 2015-07-04 MED ORDER — PHENYLEPHRINE HCL 10 MG/ML IJ SOLN
INTRAMUSCULAR | Status: DC | PRN
  Administered 2015-07-04 (×2): 80 ug via INTRAVENOUS

## 2015-07-04 MED ORDER — DEXAMETHASONE SODIUM PHOSPHATE 10 MG/ML IJ SOLN: 10.0000 mg | Freq: Once | INTRAMUSCULAR | Status: DC

## 2015-07-04 MED ORDER — ONDANSETRON HCL 4 MG/2ML IJ SOLN
INTRAMUSCULAR | Status: DC | PRN
Start: 2015-07-04 — End: 2015-07-04
  Administered 2015-07-04: 4 mg via INTRAVENOUS

## 2015-07-04 MED ORDER — LIDOCAINE HCL (CARDIAC) 20 MG/ML IV SOLN
INTRAVENOUS | Status: DC | PRN
  Administered 2015-07-04: 100 mg via INTRAVENOUS

## 2015-07-04 MED ORDER — OXYCODONE HCL 5 MG PO TABS
5.0000 mg | ORAL_TABLET | Freq: Once | ORAL | Status: AC | PRN
Start: 2015-07-04 — End: 2015-07-04
  Administered 2015-07-04: 5 mg via ORAL
  Filled 2015-07-04: qty 1

## 2015-07-04 MED ORDER — LISINOPRIL-HYDROCHLOROTHIAZIDE 10-12.5 MG PO TABS: ORAL_TABLET | ORAL | Status: DC

## 2015-07-04 MED ORDER — FENTANYL CITRATE (PF) 100 MCG/2ML IJ SOLN
INTRAMUSCULAR | Status: DC | PRN
  Administered 2015-07-04: 25 ug via INTRAVENOUS

## 2015-07-04 MED ORDER — POTASSIUM CHLORIDE ER 10 MEQ PO TBCR: EXTENDED_RELEASE_TABLET | ORAL | Status: DC

## 2015-07-04 MED ORDER — ACETAMINOPHEN 10 MG/ML IV SOLN
INTRAVENOUS | Status: AC
  Filled 2015-07-04: qty 100

## 2015-07-04 MED ORDER — OXYCODONE HCL 5 MG/5ML PO SOLN: 5.0000 mg | Freq: Once | ORAL | Status: AC | PRN

## 2015-07-04 MED ORDER — PROPOFOL 10 MG/ML IV BOLUS
INTRAVENOUS | Status: DC | PRN
  Administered 2015-07-04: 150 mg via INTRAVENOUS

## 2015-07-04 MED ORDER — METHOCARBAMOL 500 MG PO TABS: 500.0000 mg | ORAL_TABLET | Freq: Four times a day (QID) | ORAL | Status: DC

## 2015-07-04 MED ORDER — ONDANSETRON HCL 4 MG/2ML IJ SOLN: 4.0000 mg | Freq: Once | INTRAMUSCULAR | Status: DC | PRN

## 2015-07-04 MED ORDER — BUPIVACAINE-EPINEPHRINE 0.25% -1:200000 IJ SOLN
INTRAMUSCULAR | Status: DC | PRN
  Administered 2015-07-04: 20 mL

## 2015-07-04 MED ORDER — SODIUM CHLORIDE 0.9 % IV SOLN: INTRAVENOUS | Status: DC

## 2015-07-04 SURGICAL SUPPLY — 29 items
BANDAGE ELASTIC 6 VELCRO ST LF (GAUZE/BANDAGES/DRESSINGS) ×2 IMPLANT
BLADE 4.2CUDA (BLADE) ×2 IMPLANT
COVER SURGICAL LIGHT HANDLE (MISCELLANEOUS) ×2 IMPLANT
CUFF TOURN SGL QUICK 34 (TOURNIQUET CUFF) ×1
CUFF TRNQT CYL 34X4X40X1 (TOURNIQUET CUFF) ×1 IMPLANT
DRAPE U-SHAPE 47X51 STRL (DRAPES) ×2 IMPLANT
DRSG EMULSION OIL 3X3 NADH (GAUZE/BANDAGES/DRESSINGS) IMPLANT
DRSG PAD ABDOMINAL 8X10 ST (GAUZE/BANDAGES/DRESSINGS) IMPLANT
DURAPREP 26ML APPLICATOR (WOUND CARE) ×2 IMPLANT
GAUZE SPONGE 4X4 12PLY STRL (GAUZE/BANDAGES/DRESSINGS) ×2 IMPLANT
GLOVE BIO SURGEON STRL SZ8 (GLOVE) ×2 IMPLANT
GLOVE BIOGEL PI IND STRL 8 (GLOVE) ×1 IMPLANT
GLOVE BIOGEL PI INDICATOR 8 (GLOVE) ×1
GOWN STRL REUS W/TWL LRG LVL3 (GOWN DISPOSABLE) ×2 IMPLANT
KIT BASIN OR (CUSTOM PROCEDURE TRAY) ×2 IMPLANT
MANIFOLD NEPTUNE II (INSTRUMENTS) ×2 IMPLANT
PACK ARTHROSCOPY WL (CUSTOM PROCEDURE TRAY) ×2 IMPLANT
PACK ICE MAXI GEL EZY WRAP (MISCELLANEOUS) ×6 IMPLANT
PAD ABD 8X10 STRL (GAUZE/BANDAGES/DRESSINGS) ×2 IMPLANT
PADDING CAST COTTON 6X4 STRL (CAST SUPPLIES) ×2 IMPLANT
PEN SKIN MARKING BROAD (MISCELLANEOUS) ×2 IMPLANT
POSITIONER SURGICAL ARM (MISCELLANEOUS) ×2 IMPLANT
SET ARTHROSCOPY TUBING (MISCELLANEOUS) ×1
SET ARTHROSCOPY TUBING LN (MISCELLANEOUS) ×1 IMPLANT
STRIP CLOSURE SKIN 1/2X4 (GAUZE/BANDAGES/DRESSINGS) ×2 IMPLANT
SUT ETHILON 4 0 PS 2 18 (SUTURE) ×2 IMPLANT
TOWEL OR 17X26 10 PK STRL BLUE (TOWEL DISPOSABLE) ×2 IMPLANT
WAND 90 DEG TURBOVAC W/CORD (SURGICAL WAND) ×4 IMPLANT
WRAP KNEE MAXI GEL POST OP (GAUZE/BANDAGES/DRESSINGS) ×2 IMPLANT

## 2015-07-04 NOTE — Interval H&P Note (Signed)
History and Physical Interval Note:  07/04/2015 8:52 AM  [name]  has presented today for surgery, with the diagnosis of RIGHT KNEE MEDIAL MENISCUS TEAR   The various methods of treatment have been discussed with the patient and family. After consideration of risks, benefits and other options for treatment, the patient has consented to  Procedure(s): RIGHT KNEE ARTHROSCOPY WITH DEBRIDEMENT  (Right) as a surgical intervention .  The patient's history has been reviewed, patient examined, no change in status, stable for surgery.  I have reviewed the patient's chart and labs.  Questions were answered to the patient's satisfaction.     Gearlean Alf

## 2015-07-04 NOTE — Anesthesia Postprocedure Evaluation (Signed)
  Anesthesia Post-op Note  Patient: Nancy Mccormick  Procedure(s) Performed: Procedure(s): RIGHT KNEE ARTHROSCOPY WITH MEDIAL MENISCAL DEBRIDEMENT AND CHONDROPLASTY (Right)  Patient Location: PACU  Anesthesia Type:General  Level of Consciousness: awake and alert   Airway and Oxygen Therapy: Patient Spontanous Breathing  Post-op Pain: mild  Post-op Assessment: Post-op Vital signs reviewed              Post-op Vital Signs: stable  Last Vitals:  Filed Vitals:   07/04/15 1157  BP:   Pulse: 58  Temp:   Resp: 28    Complications: No apparent anesthesia complications

## 2015-07-04 NOTE — Anesthesia Preprocedure Evaluation (Addendum)
Anesthesia Evaluation  Patient identified by MRN, date of birth, ID band Patient awake    Reviewed: Allergy & Precautions, H&P , NPO status , Patient's Chart, lab work & pertinent test results  History of Anesthesia Complications Negative for: history of anesthetic complications  Airway Mallampati: II  TM Distance: >3 FB Neck ROM: full    Dental no notable dental hx.    Pulmonary neg pulmonary ROS, former smoker,    Pulmonary exam normal breath sounds clear to auscultation       Cardiovascular hypertension, Pt. on medications Normal cardiovascular exam+ dysrhythmias  Rhythm:regular Rate:Normal  Patient has a history of palpitations that are not confirmed by monitoring or in cardiology notes so they are reluctant to start anticoagulation -Echo 2011 with normal EF, mild MR and AI, normal LV and LA size which is reassurring   Neuro/Psych negative neurological ROS     GI/Hepatic negative GI ROS, Neg liver ROS,   Endo/Other  negative endocrine ROS  Renal/GU negative Renal ROS     Musculoskeletal  (+) Arthritis ,   Abdominal (+) + obese,   Peds  Hematology negative hematology ROS (+)   Anesthesia Other Findings   Reproductive/Obstetrics negative OB ROS                            Anesthesia Physical Anesthesia Plan  ASA: II  Anesthesia Plan: General   Post-op Pain Management:    Induction: Intravenous  Airway Management Planned: LMA  Additional Equipment:   Intra-op Plan:   Post-operative Plan: Extubation in OR  Informed Consent: I have reviewed the patients History and Physical, chart, labs and discussed the procedure including the risks, benefits and alternatives for the proposed anesthesia with the patient or authorized representative who has indicated his/her understanding and acceptance.   Dental Advisory Given  Plan Discussed with: Anesthesiologist, CRNA and  Surgeon  Anesthesia Plan Comments:         Anesthesia Quick Evaluation

## 2015-07-04 NOTE — Transfer of Care (Signed)
Immediate Anesthesia Transfer of Care Note  Patient: Nancy Mccormick  Procedure(s) Performed: Procedure(s): RIGHT KNEE ARTHROSCOPY WITH MEDIAL MENISCAL DEBRIDEMENT AND CHONDROPLASTY (Right)  Patient Location: PACU  Anesthesia Type:General  Level of Consciousness: sedated  Airway & Oxygen Therapy: Patient Spontanous Breathing and Patient connected to face mask oxygen  Post-op Assessment: Report given to RN and Post -op Vital signs reviewed and stable  Post vital signs: Reviewed and stable  Last Vitals:  Filed Vitals:   07/04/15 0714  BP: 131/68  Pulse: 83  Temp: 36.5 C  Resp: 18    Complications: No apparent anesthesia complications

## 2015-07-04 NOTE — Discharge Instructions (Signed)
° °Dr. Yaileen Hofferber °Total Joint Specialist °[facility] °[address] °[place], [place] [address] °[phone] ° ° °Arthroscopic Procedure, Knee °An arthroscopic procedure can find what is wrong with your knee. °PROCEDURE °Arthroscopy is a surgical technique that allows your orthopedic surgeon to diagnose and treat your knee injury with accuracy. They will look into your knee through a small instrument. This is almost like a small (pencil sized) telescope. Because arthroscopy affects your knee less than open knee surgery, you can anticipate a more rapid recovery. Taking an active role by following your caregiver's instructions will help with rapid and complete recovery. Use crutches, rest, elevation, ice, and knee exercises as instructed. The length of recovery depends on various factors including type of injury, age, physical condition, medical conditions, and your rehabilitation. °Your knee is the joint between the large bones (femur and tibia) in your leg. Cartilage covers these bone ends which are smooth and slippery and allow your knee to bend and move smoothly. Two menisci, thick, semi-lunar shaped pads of cartilage which form a rim inside the joint, help absorb shock and stabilize your knee. Ligaments bind the bones together and support your knee joint. Muscles move the joint, help support your knee, and take stress off the joint itself. Because of this all programs and physical therapy to rehabilitate an injured or repaired knee require rebuilding and strengthening your muscles. °AFTER THE PROCEDURE °· After the procedure, you will be moved to a recovery area until most of the effects of the medication have worn off. Your caregiver will discuss the test results with you.  °· Only take over-the-counter or prescription medicines for pain, discomfort, or fever as directed by your caregiver.  °SEEK MEDICAL CARE IF:  °· You have increased bleeding from your wounds.  °· You see  redness, swelling, or have increasing pain in your wounds.  °· You have pus coming from your wound.  °· You have an oral temperature above 102° F (38.9° C).  °· You notice a bad smell coming from the wound or dressing.  °· You have severe pain with any motion of your knee.  °SEEK IMMEDIATE MEDICAL CARE IF:  °· You develop a rash.  °· You have difficulty breathing.  °· You have any allergic problems.  °FURTHER INSTRUCTIONS:  °· ICE to the affected knee every three hours for 30 minutes at a time and then as needed for pain and swelling.  Continue to use ice on the knee for pain and swelling from surgery. You may notice swelling that will progress down to the foot and ankle.  This is normal after surgery.  Elevate the leg when you are not up walking on it.   ° °DIET °You may resume your previous home diet once your are discharged from the hospital. ° °DRESSING / WOUND CARE / SHOWERING °You may start showering two days after being discharged home but do not submerge the incisions under water.  °Change dressing 48 hours after the procedure and then cover the small incisions with band aids until your follow up visit. °Change the surgical dressings daily and reapply a dry dressing each time.  ° °ACTIVITY °Walk with your walker as instructed. °Use walker as long as suggested by your caregivers. °Avoid periods of inactivity such as sitting longer than an hour when not asleep. This helps prevent blood clots.  °You may resume a sexual relationship in one month or when given the OK by your doctor.  °You may return to   work once you are cleared by your doctor.  °Do not drive a car for 6 weeks or until released by you surgeon.  °Do not drive while taking narcotics. ° °WEIGHT BEARING AS TOLERATED ° °POSTOPERATIVE CONSTIPATION PROTOCOL °Constipation - defined medically as fewer than three stools per week and severe constipation as less than one stool per week. ° °One of the most common issues patients have following surgery is  constipation.  Even if you have a regular bowel pattern at home, your normal regimen is likely to be disrupted due to multiple reasons following surgery.  Combination of anesthesia, postoperative narcotics, change in appetite and fluid intake all can affect your bowels.  In order to avoid complications following surgery, here are some recommendations in order to help you during your recovery period. ° °Colace (docusate) - Pick up an over-the-counter form of Colace or another stool softener and take twice a day as long as you are requiring postoperative pain medications.  Take with a full glass of water daily.  If you experience loose stools or diarrhea, hold the colace until you stool forms back up.  If your symptoms do not get better within 1 week or if they get worse, check with your doctor. ° °Dulcolax (bisacodyl) - Pick up over-the-counter and take as directed by the product packaging as needed to assist with the movement of your bowels.  Take with a full glass of water.  Use this product as needed if not relieved by Colace only.  ° °MiraLax (polyethylene glycol) - Pick up over-the-counter to have on hand.  MiraLax is a solution that will increase the amount of water in your bowels to assist with bowel movements.  Take as directed and can mix with a glass of water, juice, soda, coffee, or tea.  Take if you go more than two days without a movement. °Do not use MiraLax more than once per day. Call your doctor if you are still constipated or irregular after using this medication for 7 days in a row. ° °If you continue to have problems with postoperative constipation, please contact the office for further assistance and recommendations.  If you experience "the worst abdominal pain ever" or develop nausea or vomiting, please contact the office immediatly for further recommendations for treatment. ° °ITCHING ° If you experience itching with your medications, try taking only a single pain pill, or even half a pain pill  at a time.  You can also use Benadryl over the counter for itching or also to help with sleep.  ° °TED HOSE STOCKINGS °Wear the elastic stockings on both legs for three weeks following surgery during the day but you may remove then at night for sleeping. ° °MEDICATIONS °See your medication summary on the “After Visit Summary” that the nursing staff will review with you prior to discharge.  You may have some home medications which will be placed on hold until you complete the course of blood thinner medication.  It is important for you to complete the blood thinner medication as prescribed by your surgeon.  Continue your approved medications as instructed at time of discharge. °Do not drive while taking narcotics.  ° °PRECAUTIONS °If you experience chest pain or shortness of breath - call 911 immediately for transfer to the hospital emergency department.  °If you develop a fever greater that 101 F, purulent drainage from wound, increased redness or drainage from wound, foul odor from the wound/dressing, or calf pain - CONTACT YOUR SURGEON.   °                                                °  FOLLOW-UP APPOINTMENTS °Make sure you keep all of your appointments after your operation with your surgeon and caregivers. You should call the office at [phone]  and make an appointment for approximately one week after the date of your surgery or on the date instructed by your surgeon outlined in the "After Visit Summary". ° °RANGE OF MOTION AND STRENGTHENING EXERCISES  °Rehabilitation of the knee is important following a knee injury or an operation. After just a few days of immobilization, the muscles of the thigh which control the knee become weakened and shrink (atrophy). Knee exercises are designed to build up the tone and strength of the thigh muscles and to improve knee motion. Often times heat used for twenty to thirty minutes before working out will loosen up your tissues and help with improving the range of motion  but do not use heat for the first two weeks following surgery. These exercises can be done on a training (exercise) mat, on the floor, on a table or on a bed. Use what ever works the best and is most comfortable for you Knee exercises include: ° °QUAD STRENGTHENING EXERCISES °Strengthening Quadriceps Sets ° °Tighten muscles on top of thigh by pushing knees down into floor or table. °Hold for 20 seconds. Repeat 10 times. °Do 2 sessions per day. ° ° ° ° °Strengthening Terminal Knee Extension ° °With knee bent over bolster, straighten knee by tightening muscle on top of thigh. Be sure to keep bottom of knee on bolster. °Hold for 20 seconds. Repeat 10 times. °Do 2 sessions per day. ° ° °Straight Leg with Bent Knee ° °Lie on back with opposite leg bent. Keep involved knee slightly bent at knee and raise leg 4-6". Hold for 10 seconds. °Repeat 20 times per set. °Do 2 sets per session. °Do 2 sessions per day. ° °

## 2015-07-04 NOTE — Op Note (Signed)
Preoperative diagnosis-  Right knee medial meniscal tear  Postoperative diagnosis Right- knee medial meniscal tear plus  Chondral defect  Procedure- Right knee arthroscopy with medial  meniscal debridement and chondroplasty   Surgeon- Dione Plover. Aluisio, MD  Anesthesia-General  EBL-  Minimal  Complications- None  Condition- PACU - hemodynamically stable.  Brief clinical note- -Nancy Mccormick is a 81 y.o.  female with a several month history of right knee pain and mechanical symptoms. Exam and history suggested medial meniscal tear confirmed by MRI. The patient presents now for arthroscopy and debridement  Procedure in detail -       After successful administration of General anesthetic, a tourmiquet is placed high on the Right  thigh and the Right lower extremity is prepped and draped in the usual sterile fashion. Time out is performed by the surgical team. Standard superomedial and inferolateral portal sites are marked and incisions made with an 11 blade. The inflow cannula is passed through the superomedial portal and camera through the inferolateral portal and inflow is initiated. Arthroscopic visualization proceeds.      The undersurface of the patella and trochlea are visualized and they are normal. The medial and lateral gutters are visualized and there are  no loose bodies. Flexion and valgus force is applied to the knee and the medial compartment is entered. A spinal needle is passed into the joint through the site marked for the inferomedial portal. A small incision is made and the dilator passed into the joint. The findings for the medial compartment are degenerative unstable tear of posterior horn of medial meniscus and 1 x 2 cm chondral defect medial femoral condyle . The tear is debrided to a stable base with baskets and a shaver and sealed off with the Arthrocare. The shaver is used to debride the unstable cartilage to a stable cartilaginous base with stable edges. It is probed and  found to be stable.    The intercondylar notch is visualized and the ACL appears normal. The lateral compartment is entered and the findings are normal .     The joint is again inspected and there are no other tears, defects or loose bodies identified. The arthroscopic equipment is then removed from the inferior portals which are closed with interrupted 4-0 nylon. 20 ml of .25% Marcaine with epinephrine are injected through the inflow cannula and the cannula is then removed and the portal closed with nylon. The incisions are cleaned and dried and a bulky sterile dressing is applied. The patient is then awakened and transported to recovery in stable condition.   07/04/2015, 10:19 AM

## 2015-08-14 ENCOUNTER — Ambulatory Visit (INDEPENDENT_AMBULATORY_CARE_PROVIDER_SITE_OTHER): Payer: Medicare Other | Admitting: Family Medicine

## 2015-08-14 ENCOUNTER — Encounter: Payer: Self-pay | Admitting: Family Medicine

## 2015-08-14 VITALS — BP 136/83 | HR 80 | Temp 97.9°F | Ht 64.0 in | Wt 186.2 lb

## 2015-08-14 DIAGNOSIS — I4891 Unspecified atrial fibrillation: Secondary | ICD-10-CM

## 2015-08-14 DIAGNOSIS — E559 Vitamin D deficiency, unspecified: Secondary | ICD-10-CM

## 2015-08-14 DIAGNOSIS — I10 Essential (primary) hypertension: Secondary | ICD-10-CM

## 2015-08-14 DIAGNOSIS — E785 Hyperlipidemia, unspecified: Secondary | ICD-10-CM | POA: Diagnosis not present

## 2015-08-14 DIAGNOSIS — R5383 Other fatigue: Secondary | ICD-10-CM | POA: Diagnosis not present

## 2015-08-14 MED ORDER — LISINOPRIL-HYDROCHLOROTHIAZIDE 10-12.5 MG PO TABS: ORAL_TABLET | ORAL | Status: DC

## 2015-08-14 MED ORDER — POTASSIUM CHLORIDE ER 10 MEQ PO TBCR: EXTENDED_RELEASE_TABLET | ORAL | Status: DC

## 2015-08-14 MED ORDER — OMEGA-3-ACID ETHYL ESTERS 1 G PO CAPS: 1.0000 | ORAL_CAPSULE | Freq: Two times a day (BID) | ORAL | Status: DC

## 2015-08-14 MED ORDER — DILTIAZEM HCL ER COATED BEADS 120 MG PO CP24: ORAL_CAPSULE | ORAL | Status: DC

## 2015-08-14 NOTE — Progress Notes (Signed)
Subjective:    Patient ID: Nancy Mccormick, female    DOB: Dec 29, 1933, 81 y.o.   MRN: [id number]  HPI Pt here for follow up and management of chronic medical problems which includes hypertension, hyperlipidemia, and A fib. She is taking medications regularly. The patient has no specific complaints today. She is due to get a mammogram and says she will schedule this. She will be given an FOBT to return and she will get lab work done today is requesting refills on all her medications. The patient complains of being fatigued. She has had some surgery done for meniscal tear in her right knee and she has found that taking hydrocodone made her heart race and give her some shortness of breath. She is sort of weaning off of this now and is doing better with these symptoms. He has a return appointment to see the orthopedic surgeon soon. She is also getting ready to see the cardiologist for her yearly visit. She otherwise denies any chest pain or shortness of breath other than the shortness of breath experience with the hydrocodone. She is not having any trouble with her GI system or with passing her water. She is a busy warm and having to take care of her husband who is very handicapped from his health issues.   Patient Active Problem List   Diagnosis Date Noted  . Acute medial meniscal tear 07/03/2015  . Osteopenia 04/19/2014  . Palpitations 11/04/2013  . Arrhythmia 11/04/2013  . FIBRILLATION, ATRIAL 09/11/2010  . HYPERLIPIDEMIA 09/10/2010  . HYPERTENSION 09/10/2010   Outpatient Encounter Prescriptions as of 08/14/2015  Medication Sig  . aspirin 81 MG tablet Take 81 mg by mouth at bedtime.   . Calcium Carbonate-Vitamin D (CALCIUM + D PO) Take 1 tablet by mouth at bedtime.  Rolena Infante [place] 450 MG CAPS Take 2 capsules by mouth daily.  . Cholecalciferol (VITAMIN D3) 1000 UNITS tablet Take 2,000 Units by mouth at bedtime.   Marland Kitchen diltiazem (CARDIZEM CD) 120 MG 24 hr capsule TAKE ONE (1) CAPSULE EACH DAY    . HYDROcodone-acetaminophen (NORCO/VICODIN) 5-325 MG tablet Take 1-2 tablets by mouth every 4 (four) hours as needed for moderate pain or severe pain.  Marland Kitchen lisinopril-hydrochlorothiazide (PRINZIDE,ZESTORETIC) 10-12.5 MG tablet TAKE ONE (1) TABLET EACH DAY BEDTIME  . Multiple Vitamin (MULTIVITAMIN WITH MINERALS) TABS tablet Take 1 tablet by mouth at bedtime.  . nitroGLYCERIN (NITROSTAT) 0.4 MG SL tablet Place 1 tablet (0.4 mg total) under the tongue as needed.  Marland Kitchen omega-3 acid ethyl esters (LOVAZA) 1 G capsule TAKE ONE CAPSULE BY MOUTH TWICE A DAY  . potassium chloride (K-DUR) 10 MEQ tablet TAKE ONE (1) TABLET EACH DAY BEDTIME  . Psyllium (NAT-RUL PSYLLIUM SEED HUSKS) 500 MG CAPS Take 2 capsules by mouth daily.  . [DISCONTINUED] methocarbamol (ROBAXIN) 500 MG tablet Take 1 tablet (500 mg total) by mouth 4 (four) times daily. As needed for muscle spasm   No facility-administered encounter medications on file as of 08/14/2015.       Review of Systems  Constitutional: Negative.   HENT: Negative.   Eyes: Negative.   Respiratory: Negative.   Cardiovascular: Negative.   Gastrointestinal: Negative.   Endocrine: Negative.   Genitourinary: Negative.   Musculoskeletal: Negative.   Skin: Negative.   Allergic/Immunologic: Negative.   Neurological: Negative.   Hematological: Negative.   Psychiatric/Behavioral: Negative.        Objective:   Physical Exam  Constitutional: She is oriented to person, place, and time. She appears well-developed  and well-nourished. No distress.  Pleasant and alert  HENT:  Head: Normocephalic and atraumatic.  Right Ear: External ear normal.  Left Ear: External ear normal.  Nose: Nose normal.  Mouth/Throat: Oropharynx is clear and moist.  Eyes: Conjunctivae and EOM are normal. Pupils are equal, round, and reactive to light. Right eye exhibits no discharge. Left eye exhibits no discharge. No scleral icterus.  Neck: Normal range of motion. Neck supple. No JVD  present. No thyromegaly present.  Cardiovascular: Normal rate, regular rhythm, normal heart sounds and intact distal pulses.   No murmur heard. The rhythm is regular at 72/m  Pulmonary/Chest: Effort normal and breath sounds normal. No respiratory distress. She has no wheezes. She has no rales. She exhibits no tenderness.  Clear anteriorly and posteriorly  Abdominal: Soft. Bowel sounds are normal. She exhibits no mass. There is no tenderness. There is no rebound and no guarding.  No abdominal tenderness or organ enlargement or bruits  Musculoskeletal: She exhibits tenderness. She exhibits no edema.  Somewhat hesitant motion with recent right knee surgery  Lymphadenopathy:    She has no cervical adenopathy.  Neurological: She is alert and oriented to person, place, and time. She has normal reflexes. No cranial nerve deficit.  Skin: Skin is warm and dry. No rash noted.  Psychiatric: She has a normal mood and affect. Her behavior is normal. Judgment and thought content normal.  Nursing note and vitals reviewed.  BP 136/83 mmHg  Pulse 80  Temp(Src) 97.9 F (36.6 C) (Oral)  Ht 5' 4" (1.626 m)  Wt 186 lb 3.2 oz (84.46 kg)  BMI 31.95 kg/m2        Assessment & Plan:  1. Hyperlipidemia LDL goal <100 -Continue current treatment pending results of lab work--is currently includes omega-3 fatty acids - CBC with Differential/Platelet - NMR, lipoprofile  2. Atrial fibrillation, unspecified -Follow-up with cardiology as planned - CBC with Differential/Platelet  3. Essential hypertension, benign -Blood pressure is good today the patient should continue with current treatment - BMP8+EGFR - Hepatic function panel - CBC with Differential/Platelet  4. Vitamin D deficiency -Continue current treatment pending results of lab work - CBC with Differential/Platelet - Vit D  25 hydroxy (rtn osteoporosis monitoring)  5. Other fatigue -We will make sure that the thyroid is functioning properly  because of the patient's fatigue. - Thyroid Panel With TSH  Meds ordered this encounter  Medications  . diltiazem (CARDIZEM CD) 120 MG 24 hr capsule    Sig: TAKE ONE (1) CAPSULE EACH DAY    Dispense:  90 capsule    Refill:  3  . lisinopril-hydrochlorothiazide (PRINZIDE,ZESTORETIC) 10-12.5 MG tablet    Sig: TAKE ONE (1) TABLET EACH DAY BEDTIME    Dispense:  90 tablet    Refill:  3  . omega-3 acid ethyl esters (LOVAZA) 1 G capsule    Sig: Take 1 capsule (1 g total) by mouth 2 (two) times daily.    Dispense:  180 capsule    Refill:  3  . potassium chloride (K-DUR) 10 MEQ tablet    Sig: TAKE ONE (1) TABLET EACH DAY BEDTIME    Dispense:  90 tablet    Refill:  3   Patient Instructions                       Medicare Annual Wellness Visit  [facility] and the medical providers at [facility] strive to bring you the best medical care.  In  doing so we not only want to address your current medical conditions and concerns but also to detect new conditions early and prevent illness, disease and health-related problems.    Medicare offers a yearly Wellness Visit which allows our clinical staff to assess your need for preventative services including immunizations, lifestyle education, counseling to decrease risk of preventable diseases and screening for fall risk and other medical concerns.    This visit is provided free of charge (no copay) for all Medicare recipients. The clinical pharmacists at [facility] have begun to conduct these Wellness Visits which will also include a thorough review of all your medications.    As you primary medical provider recommend that you make an appointment for your Annual Wellness Visit if you have not done so already this year.  You may set up this appointment before you leave today or you may call back [phone]) and schedule an appointment.  Please make sure when you call that you mention that you are scheduling your  Annual Wellness Visit with the clinical pharmacist so that the appointment may be made for the proper length of time.     Continue current medications. Continue good therapeutic lifestyle changes which include good diet and exercise. Fall precautions discussed with patient. If an FOBT was given today- please return it to our front desk. If you are over 73 years old - you may need Prevnar 7 or the adult Pneumonia vaccine.  **Flu shots are available--- please call and schedule a FLU-CLINIC appointment**  After your visit with [place] today you will receive a survey in the mail or online from [employer] regarding your care with [place]. Please take a moment to fill this out. Your feedback is very important to [place] as you can help [place] better understand your patient needs as well as improve your experience and satisfaction. WE CARE ABOUT YOU!!!   Continue to take current medications Follow-up with cardiology as planned Follow-up with orthopedist as planned Continue to drink plenty of fluids and stay active as possible   Arrie Senate MD

## 2015-08-14 NOTE — Patient Instructions (Addendum)
Medicare Annual Wellness Visit  [facility] and the medical providers at [facility] strive to bring you the best medical care.  In doing so we not only want to address your current medical conditions and concerns but also to detect new conditions early and prevent illness, disease and health-related problems.    Medicare offers a yearly Wellness Visit which allows our clinical staff to assess your need for preventative services including immunizations, lifestyle education, counseling to decrease risk of preventable diseases and screening for fall risk and other medical concerns.    This visit is provided free of charge (no copay) for all Medicare recipients. The clinical pharmacists at [facility] have begun to conduct these Wellness Visits which will also include a thorough review of all your medications.    As you primary medical provider recommend that you make an appointment for your Annual Wellness Visit if you have not done so already this year.  You may set up this appointment before you leave today or you may call back ([phone]) and schedule an appointment.  Please make sure when you call that you mention that you are scheduling your Annual Wellness Visit with the clinical pharmacist so that the appointment may be made for the proper length of time.     Continue current medications. Continue good therapeutic lifestyle changes which include good diet and exercise. Fall precautions discussed with patient. If an FOBT was given today- please return it to our front desk. If you are over 88 years old - you may need Prevnar 28 or the adult Pneumonia vaccine.  **Flu shots are available--- please call and schedule a FLU-CLINIC appointment**  After your visit with [place] today you will receive a survey in the mail or online from [employer] regarding your care with [place]. Please take a moment to fill this out. Your feedback is very  important to [place] as you can help [place] better understand your patient needs as well as improve your experience and satisfaction. WE CARE ABOUT YOU!!!   Continue to take current medications Follow-up with cardiology as planned Follow-up with orthopedist as planned Continue to drink plenty of fluids and stay active as possible

## 2015-08-16 LAB — BMP8+EGFR
BUN/Creatinine Ratio: 23 (ref 11–26)
BUN: 14 mg/dL (ref 8–27)
CALCIUM: 9.6 mg/dL (ref 8.7–10.3)
CO2: 24 mmol/L (ref 18–29)
CREATININE: 0.61 mg/dL (ref 0.57–1.00)
Chloride: 103 mmol/L (ref 97–106)
GFR calc Af Amer: 100 mL/min/{1.73_m2} (ref >59)
GFR, EST NON AFRICAN AMERICAN: 87 mL/min/{1.73_m2} (ref >59)
Glucose: 107 mg/dL — ABNORMAL HIGH (ref 65–99)
Potassium: 4.3 mmol/L (ref 3.5–5.2)
SODIUM: 144 mmol/L (ref 136–144)

## 2015-08-16 LAB — HEPATIC FUNCTION PANEL
ALBUMIN: 4.1 g/dL (ref 3.5–4.8)
ALK PHOS: 92 IU/L (ref 39–117)
ALT: 19 IU/L (ref 0–32)
AST: 14 IU/L (ref 0–40)
Bilirubin Total: 0.4 mg/dL (ref 0.0–1.2)
Bilirubin, Direct: 0.09 mg/dL (ref 0.00–0.40)
Total Protein: 6.3 g/dL (ref 6.0–8.5)

## 2015-08-16 LAB — CBC WITH DIFFERENTIAL/PLATELET
BASOS ABS: 0 10*3/uL (ref 0.0–0.2)
BASOS: 1 %
EOS (ABSOLUTE): 0.2 10*3/uL (ref 0.0–0.4)
Eos: 2 %
Hematocrit: 39.8 % (ref 34.0–46.6)
Hemoglobin: 13.6 g/dL (ref 11.1–15.9)
IMMATURE GRANULOCYTES: 0 %
Immature Grans (Abs): 0 10*3/uL (ref 0.0–0.1)
Lymphocytes Absolute: 2.1 10*3/uL (ref 0.7–3.1)
Lymphs: 25 %
MCH: 31.9 pg (ref 26.6–33.0)
MCHC: 34.2 g/dL (ref 31.5–35.7)
MCV: 93 fL (ref 79–97)
MONOS ABS: 0.7 10*3/uL (ref 0.1–0.9)
Monocytes: 8 %
NEUTROS ABS: 5.4 10*3/uL (ref 1.4–7.0)
NEUTROS PCT: 64 %
PLATELETS: 379 10*3/uL (ref 150–379)
RBC: 4.26 x10E6/uL (ref 3.77–5.28)
RDW: 14.3 % (ref 12.3–15.4)
WBC: 8.5 10*3/uL (ref 3.4–10.8)

## 2015-08-16 LAB — NMR, LIPOPROFILE
Cholesterol: 227 mg/dL — ABNORMAL HIGH (ref 100–199)
HDL Cholesterol by NMR: 72 mg/dL (ref >39)
HDL Particle Number: 44 umol/L (ref >=30.5)
LDL Particle Number: 1288 nmol/L — ABNORMAL HIGH (ref <1000)
LDL SIZE: 21.4 nm (ref >20.5)
LDL-C: 124 mg/dL — ABNORMAL HIGH (ref 0–99)
LP-IR Score: 40 (ref <=45)
SMALL LDL PARTICLE NUMBER: 334 nmol/L (ref <=527)
Triglycerides by NMR: 157 mg/dL — ABNORMAL HIGH (ref 0–149)

## 2015-08-16 LAB — VITAMIN D 25 HYDROXY (VIT D DEFICIENCY, FRACTURES): VIT D 25 HYDROXY: 32.2 ng/mL (ref 30.0–100.0)

## 2015-09-18 ENCOUNTER — Telehealth: Payer: Self-pay | Admitting: Family Medicine

## 2015-09-19 ENCOUNTER — Encounter: Payer: Self-pay | Admitting: Cardiology

## 2015-09-19 ENCOUNTER — Ambulatory Visit (INDEPENDENT_AMBULATORY_CARE_PROVIDER_SITE_OTHER): Payer: Medicare Other | Admitting: Cardiology

## 2015-09-19 VITALS — BP 132/78 | HR 68 | Ht 64.0 in | Wt 189.0 lb

## 2015-09-19 DIAGNOSIS — R002 Palpitations: Secondary | ICD-10-CM | POA: Diagnosis not present

## 2015-09-19 NOTE — Patient Instructions (Signed)
Medication Instructions:  The current medical regimen is effective;  continue present plan and medications.  Testing/Procedures: Your physician has recommended that you wear an event monitor. This should be placed at [facility].  Please call them to schedule once you are ready to have it put on.  Event monitors are medical devices that record the heart's electrical activity. Doctors most often [place] these monitors to diagnose arrhythmias. Arrhythmias are problems with the speed or rhythm of the heartbeat. The monitor is a small, portable device. You can wear one while you do your normal daily activities. This is usually used to diagnose what is causing palpitations/syncope (passing out).  Follow-Up: Follow up with Dr Percival Spanish in [place] in 6 months.  If you need a refill on your cardiac medications before your next appointment, please call your pharmacy.  Thank you for choosing [facility]!!

## 2015-09-19 NOTE — Progress Notes (Signed)
HPI The patient presents for followup of atrial fibrillation which was documented in  2011.  I saw her in 2015 because of palpitations but an event monitor at that time did not demonstrate fibrillation.  I did review these results and she did have atrial trigeminy. However, because she had only one distant episode of fibrillation documented not on anticoagulation. 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 With this she denied pain medications and she has had increasing palpitations and shortness of breath. As she's gotten off of the pain medications the palpitations have improved and her breathing is coming back to baseline. She's doing a little bit of exercise at home and she still works part-time. She's not able to bring on palpitations. She doesn't bring on any chest pressure, neck or arm discomfort. She doesn't have any PND or orthopnea. She's had no presyncope or syncope.  Allergies  Allergen Reactions  . Atorvastatin Other (See Comments)    myalgias   . Crestor [Rosuvastatin] Other (See Comments)    Myalgias   . Livalo [Pitavastatin] Other (See Comments)    myalgias  . Shellfish Allergy Itching, Swelling and Rash    Current Outpatient Prescriptions  Medication Sig Dispense Refill  . aspirin 81 MG tablet Take 81 mg by mouth at bedtime.     . Calcium Carbonate-Vitamin D (CALCIUM + D PO) Take 1 tablet by mouth at bedtime.    Rolena Infante Sagrada 450 MG CAPS Take 2 capsules by mouth daily.    . Cholecalciferol (VITAMIN D3) 1000 UNITS tablet Take 2,000 Units by mouth at bedtime.     Marland Kitchen diltiazem (DILACOR XR) 120 MG 24 hr capsule Take 120 mg by mouth daily.    Marland Kitchen lisinopril-hydrochlorothiazide (PRINZIDE,ZESTORETIC) 10-12.5 MG tablet Take 1 tablet by mouth daily.    . Multiple Vitamin (MULTIVITAMIN WITH MINERALS) TABS tablet Take 1 tablet by mouth at bedtime.    . nitroGLYCERIN (NITROSTAT) 0.4 MG SL tablet Place 1 tablet (0.4 mg total)  under the tongue as needed. 25 tablet 11  . omega-3 acid ethyl esters (LOVAZA) 1 G capsule Take 1 capsule (1 g total) by mouth 2 (two) times daily. 180 capsule 3  . potassium chloride (K-DUR) 10 MEQ tablet Take 10 mEq by mouth daily.    . Psyllium (NAT-RUL PSYLLIUM SEED HUSKS) 500 MG CAPS Take 2 capsules by mouth daily.     No current facility-administered medications for this visit.    Past Medical History  Diagnosis Date  . Hypertension   . Hyperlipidemia   . Osteoporosis   . Atrial fibrillation ([place])   . Wears glasses   . Arthritis     back, left shoulder, and fingers  . Palpitations   . Headache     occasional     Past Surgical History  Procedure Laterality Date  . Vaginal hysterectomy    . Cataract extraction    . Open reduction internal fixation (orif) distal radial fracture Right 12/28/2012    Procedure: OPEN REDUCTION INTERNAL FIXATION (ORIF) DISTAL RADIAL FRACTURE;  Surgeon: Hessie Dibble, MD;  Location: [facility];  Service: Orthopedics;  Laterality: Right;  . Knee arthroscopy Right 07/04/2015    Procedure: RIGHT KNEE ARTHROSCOPY WITH MEDIAL MENISCAL DEBRIDEMENT AND CHONDROPLASTY;  Surgeon: Gaynelle Arabian, MD;  Location: [facility];  Service: Orthopedics;  Laterality: Right;    ROS:  As stated in the HPI and negative for all other systems.  PHYSICAL EXAM BP 132/78 mmHg  Pulse 68  Ht 5\' 4"  (1.626 m)  Wt 189 lb (85.73 kg)  BMI 32.43 kg/m2 GENERAL:  Well appearing NECK:  No jugular venous distention, waveform within normal limits, carotid upstroke brisk and symmetric, no bruits, no thyromegaly LYMPHATICS:  No cervical, inguinal adenopathy LUNGS:  Clear to auscultation bilaterally CHEST:  Unremarkable HEART:  PMI not displaced or sustained,S1 and S2 within normal limits, no S3, no S4, no clicks, no rubs, no murmurs ABD:  Flat, positive bowel sounds normal in frequency in pitch, no bruits, no rebound, no guarding, no midline pulsatile mass, no  hepatomegaly, no splenomegaly EXT:  2 plus pulses throughout, no edema, no cyanosis no clubbing  EKG:  NSR, rate 76axis within normal limits, intervals within normal limits, no acute ST T wave changes. PACs.  Poor anterior R wave progression (Tracing reviewed this appt.) 19/20/16  ASSESSMENT AND PLAN  ATRIAL FIBRILLATION:  I do not want to miss atrial fibrillation recurrence. Therefore, she will need a monitor. Natalyn Verderber will need a 21 day event monitor.  The patients symptoms necessitate an event monitor.  The symptoms are too infrequent to be identified on a Holter monitor.    HTN:  The blood pressure is at target. No change in medications is indicated. We will continue with therapeutic lifestyle changes (TLC).  I did discuss with her the need for exercise she has some pedals that she can use at home to exercise and we discussed a regimen.

## 2015-09-19 NOTE — Telephone Encounter (Signed)
Per nurse, no appointment scheduled but patient should come in today and her ears would be flushed out.

## 2015-10-02 ENCOUNTER — Other Ambulatory Visit: Payer: Self-pay

## 2015-10-02 ENCOUNTER — Ambulatory Visit (INDEPENDENT_AMBULATORY_CARE_PROVIDER_SITE_OTHER): Payer: Medicare Other

## 2015-10-02 DIAGNOSIS — R002 Palpitations: Secondary | ICD-10-CM

## 2015-10-02 DIAGNOSIS — E785 Hyperlipidemia, unspecified: Secondary | ICD-10-CM

## 2015-10-02 NOTE — Progress Notes (Signed)
Cardiac event monitor placed, instructions provided and understood by patient

## 2015-10-24 ENCOUNTER — Telehealth: Payer: Self-pay | Admitting: Family Medicine

## 2015-10-24 NOTE — Telephone Encounter (Signed)
Stp and gave her the 1-800 number to call and ask what she needed to do.

## 2015-11-02 DIAGNOSIS — I10 Essential (primary) hypertension: Secondary | ICD-10-CM | POA: Diagnosis not present

## 2015-11-02 DIAGNOSIS — H5210 Myopia, unspecified eye: Secondary | ICD-10-CM | POA: Diagnosis not present

## 2015-11-06 ENCOUNTER — Other Ambulatory Visit: Payer: Medicare HMO

## 2015-11-06 DIAGNOSIS — Z1231 Encounter for screening mammogram for malignant neoplasm of breast: Secondary | ICD-10-CM | POA: Diagnosis not present

## 2015-11-06 LAB — HM MAMMOGRAPHY: HM MAMMO: NEGATIVE

## 2015-11-09 ENCOUNTER — Encounter: Payer: Self-pay | Admitting: *Deleted

## 2015-11-13 ENCOUNTER — Telehealth: Payer: Self-pay | Admitting: *Deleted

## 2015-11-13 NOTE — Telephone Encounter (Signed)
Spoke with pt, Monitor reviewed by dr hochrein was normal sinus rhythm.

## 2015-11-23 ENCOUNTER — Encounter: Payer: Self-pay | Admitting: Cardiology

## 2015-12-19 ENCOUNTER — Ambulatory Visit (INDEPENDENT_AMBULATORY_CARE_PROVIDER_SITE_OTHER): Payer: Medicare HMO

## 2015-12-19 ENCOUNTER — Encounter: Payer: Self-pay | Admitting: Family Medicine

## 2015-12-19 ENCOUNTER — Ambulatory Visit (INDEPENDENT_AMBULATORY_CARE_PROVIDER_SITE_OTHER): Payer: Medicare HMO | Admitting: Family Medicine

## 2015-12-19 ENCOUNTER — Ambulatory Visit: Payer: Medicare Other | Admitting: Family Medicine

## 2015-12-19 VITALS — BP 127/79 | HR 68 | Temp 97.2°F | Ht 64.0 in | Wt 185.0 lb

## 2015-12-19 DIAGNOSIS — I10 Essential (primary) hypertension: Secondary | ICD-10-CM

## 2015-12-19 DIAGNOSIS — I499 Cardiac arrhythmia, unspecified: Secondary | ICD-10-CM

## 2015-12-19 DIAGNOSIS — E559 Vitamin D deficiency, unspecified: Secondary | ICD-10-CM | POA: Diagnosis not present

## 2015-12-19 DIAGNOSIS — Z1211 Encounter for screening for malignant neoplasm of colon: Secondary | ICD-10-CM | POA: Diagnosis not present

## 2015-12-19 DIAGNOSIS — I4891 Unspecified atrial fibrillation: Secondary | ICD-10-CM

## 2015-12-19 DIAGNOSIS — E785 Hyperlipidemia, unspecified: Secondary | ICD-10-CM | POA: Diagnosis not present

## 2015-12-19 DIAGNOSIS — Z789 Other specified health status: Secondary | ICD-10-CM

## 2015-12-19 DIAGNOSIS — Z889 Allergy status to unspecified drugs, medicaments and biological substances status: Secondary | ICD-10-CM

## 2015-12-19 NOTE — Progress Notes (Signed)
Subjective:    Patient ID: Nancy Mccormick, female    DOB: 10/12/1933, 82 y.o.   MRN: [id number]  HPI Pt here for follow up and management of chronic medical problems which includes hypertension and hyperlipidemia. She is taking medications regularly.The patient is somewhat upset today because she has loss and nephew and niece within a very short period time and she says she is always extremely emotional and this is head a lot of impact on her and her worrying about her sister. The patient denies any chest pain shortness of breath palpitations trouble swallowing heartburn indigestion nausea vomiting diarrhea blood in the stool black tarry bowel movements trouble passing her water. She says she feels good overall other than being upset about the loss of her relatives. In the past several months she did have a monitor and was told just to stay on baby aspirin at that time.     Patient Active Problem List   Diagnosis Date Noted  . Acute medial meniscal tear 07/03/2015  . Osteopenia 04/19/2014  . Palpitations 11/04/2013  . Arrhythmia 11/04/2013  . FIBRILLATION, ATRIAL 09/11/2010  . HYPERLIPIDEMIA 09/10/2010  . HYPERTENSION 09/10/2010   Outpatient Encounter Prescriptions as of 12/19/2015  Medication Sig  . aspirin 81 MG tablet Take 81 mg by mouth at bedtime.   . Calcium Carbonate-Vitamin D (CALCIUM + D PO) Take 1 tablet by mouth at bedtime.  Rolena Infante Sagrada 450 MG CAPS Take 2 capsules by mouth daily.  . Cholecalciferol (VITAMIN D3) 1000 UNITS tablet Take 2,000 Units by mouth at bedtime.   Marland Kitchen diltiazem (DILACOR XR) 120 MG 24 hr capsule Take 120 mg by mouth daily.  Marland Kitchen lisinopril-hydrochlorothiazide (PRINZIDE,ZESTORETIC) 10-12.5 MG tablet Take 1 tablet by mouth daily.  . Multiple Vitamin (MULTIVITAMIN WITH MINERALS) TABS tablet Take 1 tablet by mouth at bedtime.  . nitroGLYCERIN (NITROSTAT) 0.4 MG SL tablet Place 1 tablet (0.4 mg total) under the tongue as needed.  Marland Kitchen omega-3 acid ethyl esters  (LOVAZA) 1 G capsule Take 1 capsule (1 g total) by mouth 2 (two) times daily.  . potassium chloride (K-DUR) 10 MEQ tablet Take 10 mEq by mouth daily.  . Psyllium (NAT-RUL PSYLLIUM SEED HUSKS) 500 MG CAPS Take 2 capsules by mouth daily.   No facility-administered encounter medications on file as of 12/19/2015.      Review of Systems  Constitutional: Negative.   HENT: Negative.   Eyes: Negative.   Respiratory: Negative.   Cardiovascular: Negative.   Gastrointestinal: Negative.   Endocrine: Negative.   Genitourinary: Negative.   Musculoskeletal: Negative.   Skin: Negative.   Allergic/Immunologic: Negative.   Neurological: Negative.   Hematological: Negative.   Psychiatric/Behavioral: Negative.        Objective:   Physical Exam  Constitutional: She is oriented to person, place, and time. She appears well-developed and well-nourished. No distress.  HENT:  Head: Normocephalic and atraumatic.  Right Ear: External ear normal.  Left Ear: External ear normal.  Mouth/Throat: Oropharynx is clear and moist.  Slight nasal congestion bilaterally  Eyes: Conjunctivae and EOM are normal. Pupils are equal, round, and reactive to light. Right eye exhibits no discharge. Left eye exhibits no discharge. No scleral icterus.  Neck: Normal range of motion. Neck supple. No thyromegaly present.  Cardiovascular: Normal rate, regular rhythm, normal heart sounds and intact distal pulses.   No murmur heard. Heart was irregular irregular at 72-84/m  Pulmonary/Chest: Effort normal and breath sounds normal. No respiratory distress. She has no wheezes. She has  no rales. She exhibits no tenderness.  Clear anteriorly and posteriorly  Abdominal: Soft. Bowel sounds are normal. She exhibits no mass. There is no tenderness. There is no rebound and no guarding.  No abdominal tenderness bruits organ enlargement or masses  Musculoskeletal: Normal range of motion. She exhibits no edema.  Lymphadenopathy:    She has  no cervical adenopathy.  Neurological: She is alert and oriented to person, place, and time. She has normal reflexes. No cranial nerve deficit.  Skin: Skin is warm and dry. No rash noted.  Psychiatric: She has a normal mood and affect. Her behavior is normal. Judgment and thought content normal.  Nursing note and vitals reviewed.  BP 127/79 mmHg  Pulse 68  Temp(Src) 97.2 F (36.2 C) (Oral)  Ht 5' 4"  (1.626 m)  Wt 185 lb (83.915 kg)  BMI 31.74 kg/m2  EKG: Normal sinus rhythm, but when listening to the patient she was clearly an atrial fib before the EKG was done       Assessment & Plan:  1. Hyperlipidemia LDL goal <100 -Return to clinic for fasting lab work. Patient is statin intolerant. She will have to continue with aggressive therapeutic lifestyle changes. - CBC with Differential/Platelet; Future - NMR, lipoprofile; Future - DG Chest 2 View; Future  2. Essential hypertension, benign -The blood pressure is good today and she will continue with her current treatment regimen - BMP8+EGFR; Future - CBC with Differential/Platelet; Future - Hepatic function panel; Future - DG Chest 2 View; Future  3. Atrial fibrillation, unspecified -The heart was irregular today is sounds like atrial fib. She had a event monitor done in December which showed some atrophia at that time she was discontinued on a baby aspirin. If she remains in atrophia today we will notify the cardiologist to see if he thinks that she needs any other treatment other than a baby aspirin. The patient is asymptomatic having no chest pain no shortness of breath. - CBC with Differential/Platelet; Future - DG Chest 2 View; Future  4. Vitamin D deficiency -Continue vitamin D replacement pending results of lab work - CBC with Differential/Platelet; Future - VITAMIN D 25 Hydroxy (Vit-D Deficiency, Fractures); Future  5. Special screening for malignant neoplasms, colon - Fecal occult blood, imunochemical; Future  6.  Statin intolerance -She will have to continue with aggressive therapeutic lifestyle changes  7. Irregular heart rhythm - EKG 12-Lead--- normal sinus rhythm. We'll discuss patient with the cardiologist to make sure that we need to do nothing else.  Patient Instructions                       Medicare Annual Wellness Visit  [facility] and the medical providers at [facility] strive to bring you the best medical care.  In doing so we not only want to address your current medical conditions and concerns but also to detect new conditions early and prevent illness, disease and health-related problems.    Medicare offers a yearly Wellness Visit which allows our clinical staff to assess your need for preventative services including immunizations, lifestyle education, counseling to decrease risk of preventable diseases and screening for fall risk and other medical concerns.    This visit is provided free of charge (no copay) for all Medicare recipients. The clinical pharmacists at [facility] have begun to conduct these Wellness Visits which will also include a thorough review of all your medications.    As you primary medical provider  recommend that you make an appointment for your Annual Wellness Visit if you have not done so already this year.  You may set up this appointment before you leave today or you may call back ([phone]) and schedule an appointment.  Please make sure when you call that you mention that you are scheduling your Annual Wellness Visit with the clinical pharmacist so that the appointment may be made for the proper length of time.     Continue current medications. Continue good therapeutic lifestyle changes which include good diet and exercise. Fall precautions discussed with patient. If an FOBT was given today- please return it to our front desk. If you are over 19 years old - you may need Prevnar 67 or the adult Pneumonia  vaccine.  **Flu shots are available--- please call and schedule a FLU-CLINIC appointment**  After your visit with [place] today you will receive a survey in the mail or online from [employer] regarding your care with [place]. Please take a moment to fill this out. Your feedback is very important to [place] as you can help [place] better understand your patient needs as well as improve your experience and satisfaction. WE CARE ABOUT YOU!!!   \Continue to watch caffeine intake Stay active watch sodium intake Drink plenty of fluids and stay well hydrated       Arrie Senate MD

## 2015-12-19 NOTE — Patient Instructions (Addendum)
Medicare Annual Wellness Visit  [facility] and the medical providers at [facility] strive to bring you the best medical care.  In doing so we not only want to address your current medical conditions and concerns but also to detect new conditions early and prevent illness, disease and health-related problems.    Medicare offers a yearly Wellness Visit which allows our clinical staff to assess your need for preventative services including immunizations, lifestyle education, counseling to decrease risk of preventable diseases and screening for fall risk and other medical concerns.    This visit is provided free of charge (no copay) for all Medicare recipients. The clinical pharmacists at [facility] have begun to conduct these Wellness Visits which will also include a thorough review of all your medications.    As you primary medical provider recommend that you make an appointment for your Annual Wellness Visit if you have not done so already this year.  You may set up this appointment before you leave today or you may call back [phone] and schedule an appointment.  Please make sure when you call that you mention that you are scheduling your Annual Wellness Visit with the clinical pharmacist so that the appointment may be made for the proper length of time.     Continue current medications. Continue good therapeutic lifestyle changes which include good diet and exercise. Fall precautions discussed with patient. If an FOBT was given today- please return it to our front desk. If you are over 70 years old - you may need Prevnar 33 or the adult Pneumonia vaccine.  **Flu shots are available--- please call and schedule a FLU-CLINIC appointment**  After your visit with [place] today you will receive a survey in the mail or online from [employer] regarding your care with [place]. Please take a moment to fill this out. Your feedback is very  important to [place] as you can help [place] better understand your patient needs as well as improve your experience and satisfaction. WE CARE ABOUT YOU!!!   \Continue to watch caffeine intake Stay active watch sodium intake Drink plenty of fluids and stay well hydrated

## 2015-12-27 ENCOUNTER — Telehealth: Payer: Self-pay | Admitting: *Deleted

## 2015-12-27 DIAGNOSIS — R002 Palpitations: Secondary | ICD-10-CM

## 2015-12-27 DIAGNOSIS — I499 Cardiac arrhythmia, unspecified: Secondary | ICD-10-CM

## 2015-12-27 NOTE — Telephone Encounter (Signed)
AFTER TALKING WITH Dr Percival Spanish - [facility] decided that pt needs new KOH monitor placed and she is to continue ASA 325 - instead of 81 mg.     LM - jhb 3/23

## 2015-12-27 NOTE — Telephone Encounter (Signed)
Pt aware of all and will come in for KOH

## 2015-12-31 ENCOUNTER — Ambulatory Visit: Payer: Medicare HMO | Admitting: *Deleted

## 2015-12-31 ENCOUNTER — Other Ambulatory Visit: Payer: Medicare HMO

## 2015-12-31 DIAGNOSIS — I10 Essential (primary) hypertension: Secondary | ICD-10-CM | POA: Diagnosis not present

## 2015-12-31 DIAGNOSIS — Z1211 Encounter for screening for malignant neoplasm of colon: Secondary | ICD-10-CM | POA: Diagnosis not present

## 2015-12-31 DIAGNOSIS — E785 Hyperlipidemia, unspecified: Secondary | ICD-10-CM | POA: Diagnosis not present

## 2015-12-31 DIAGNOSIS — E559 Vitamin D deficiency, unspecified: Secondary | ICD-10-CM

## 2015-12-31 DIAGNOSIS — I4891 Unspecified atrial fibrillation: Secondary | ICD-10-CM

## 2016-01-01 LAB — CBC WITH DIFFERENTIAL/PLATELET
Basophils Absolute: 0 x10E3/uL (ref 0.0–0.2)
Basos: 0 %
EOS (ABSOLUTE): 0.2 x10E3/uL (ref 0.0–0.4)
Eos: 3 %
Hematocrit: 39.1 % (ref 34.0–46.6)
Hemoglobin: 13.8 g/dL (ref 11.1–15.9)
Immature Grans (Abs): 0 x10E3/uL (ref 0.0–0.1)
Immature Granulocytes: 0 %
Lymphocytes Absolute: 2.2 x10E3/uL (ref 0.7–3.1)
Lymphs: 26 %
MCH: 32.9 pg (ref 26.6–33.0)
MCHC: 35.3 g/dL (ref 31.5–35.7)
MCV: 93 fL (ref 79–97)
Monocytes Absolute: 0.6 x10E3/uL (ref 0.1–0.9)
Monocytes: 7 %
Neutrophils Absolute: 5.4 x10E3/uL (ref 1.4–7.0)
Neutrophils: 64 %
Platelets: 348 x10E3/uL (ref 150–379)
RBC: 4.19 x10E6/uL (ref 3.77–5.28)
RDW: 14.1 % (ref 12.3–15.4)
WBC: 8.5 x10E3/uL (ref 3.4–10.8)

## 2016-01-01 LAB — NMR, LIPOPROFILE
Cholesterol: 241 mg/dL — ABNORMAL HIGH (ref 100–199)
HDL Cholesterol by NMR: 63 mg/dL (ref >39)
HDL PARTICLE NUMBER: 39.6 umol/L (ref >=30.5)
LDL Particle Number: 1748 nmol/L — ABNORMAL HIGH (ref <1000)
LDL SIZE: 21.1 nm (ref >20.5)
LDL-C: 140 mg/dL — ABNORMAL HIGH (ref 0–99)
LP-IR SCORE: 50 — AB (ref <=45)
SMALL LDL PARTICLE NUMBER: 726 nmol/L — AB (ref <=527)
Triglycerides by NMR: 191 mg/dL — ABNORMAL HIGH (ref 0–149)

## 2016-01-01 LAB — BMP8+EGFR
BUN/Creatinine Ratio: 21 (ref 11–26)
BUN: 14 mg/dL (ref 8–27)
CO2: 26 mmol/L (ref 18–29)
Calcium: 9.4 mg/dL (ref 8.7–10.3)
Chloride: 106 mmol/L (ref 96–106)
Creatinine, Ser: 0.68 mg/dL (ref 0.57–1.00)
GFR calc Af Amer: 96 mL/min/1.73
GFR calc non Af Amer: 83 mL/min/1.73
Glucose: 95 mg/dL (ref 65–99)
Potassium: 4.4 mmol/L (ref 3.5–5.2)
Sodium: 145 mmol/L — ABNORMAL HIGH (ref 134–144)

## 2016-01-01 LAB — HEPATIC FUNCTION PANEL
ALBUMIN: 4.3 g/dL (ref 3.5–4.8)
ALK PHOS: 85 IU/L (ref 39–117)
ALT: 15 IU/L (ref 0–32)
AST: 10 IU/L (ref 0–40)
BILIRUBIN TOTAL: 0.6 mg/dL (ref 0.0–1.2)
Bilirubin, Direct: 0.15 mg/dL (ref 0.00–0.40)
Total Protein: 6.1 g/dL (ref 6.0–8.5)

## 2016-01-01 LAB — VITAMIN D 25 HYDROXY (VIT D DEFICIENCY, FRACTURES): VIT D 25 HYDROXY: 26.6 ng/mL — AB (ref 30.0–100.0)

## 2016-01-03 ENCOUNTER — Telehealth: Payer: Self-pay | Admitting: Family Medicine

## 2016-01-03 NOTE — Telephone Encounter (Signed)
Patient aware of results.

## 2016-01-04 LAB — FECAL OCCULT BLOOD, IMMUNOCHEMICAL: FECAL OCCULT BLD: NEGATIVE

## 2016-01-07 ENCOUNTER — Ambulatory Visit: Payer: Medicare HMO

## 2016-01-07 ENCOUNTER — Ambulatory Visit (INDEPENDENT_AMBULATORY_CARE_PROVIDER_SITE_OTHER): Payer: Medicare HMO | Admitting: Pharmacist

## 2016-01-07 DIAGNOSIS — R002 Palpitations: Secondary | ICD-10-CM | POA: Diagnosis not present

## 2016-01-07 DIAGNOSIS — E785 Hyperlipidemia, unspecified: Secondary | ICD-10-CM

## 2016-01-07 MED ORDER — EZETIMIBE 10 MG PO TABS: 10.0000 mg | ORAL_TABLET | Freq: Every day | ORAL | Status: DC

## 2016-01-07 NOTE — Addendum Note (Signed)
Addended by: Thana Ates on: 01/07/2016 03:05 PM   Modules accepted: Orders

## 2016-01-07 NOTE — Progress Notes (Signed)
Patient ID: Nancy Mccormick, female   DOB: 1933/10/26, 82 y.o.   MRN: [id number] Lipid Clinic Consultation  Chief Complaint:   Chief Complaint  Patient presents with  . Hyperlipidemia     HPI:  Patient has been referred by Dr Redge Gainer for hyperlipidemia.  I saw her last about 1 year ago.  At that time she has tried both atorvastatin and rosuvastatin and both caused myalgias.  One year ago she took Livalo 2mg  qd for 1 month and stopped after 1 month due to myalgias. We then started pravastatin 20mg  3 times per week.  After about 4 months patient again has myalgias and stopped.  She is taking only fish oil for lipids.  No personal history of stroke or MI or CAD.  She does have a strong family history of stroke - father has stroke in his 27's;  Mother had a stroke in 87's and sister has a stroke in her 47's    Component Value Date/Time   CHOL 241 12/29/2015   LDL 140 12/29/2015   HDL 63 12/29/2015   LDL-P 1748 12/29/2015   TRIG 191 12/29/2015      Component Value Date/Time   CHOL 227 08/14/2015   LDL 124 08/14/2015   HDL 72 08/14/2015   LDL-P 1288 08/14/2015   TRIG 157 08/14/2015       Component Value Date/Time   CHOL 232 06/15/2014   LDL 135 06/15/2014   HDL 72 06/15/2014   LDL-P 1928 06/15/2014   TRIG 125 06/15/2014    Assessment: AHA ASCVD risk = 19.2%  NCEP Risk Factors Present:  HTN, family history and age Primary Problem(s):  LDL or LDL-P elevated and Triglycerides elevated (verified patient was fasting)  Current NCEP Goals: LDL Goal < 100 HDL Goal >/= 45 Tg Goal < 150 Non-HDL Goal < 130  Secondary cause of hyperlipidemia present:  None Low fat diet followed?  Yes    Low carb diet followed?  Yes  Exercise?  Yes - started riding stationary bike several days per week.  Usually for 30 minutes per session    Assessment:   Elevated LDL-P and triglycerides;   Has tried 4 statins and is intolerant of all.    Recommendations: 1.  Start Zetia 10mg  take 1  tablet daily.  Will also check again on Repatha or Praleunt coverage.  2.  Continue low fat diet. Discussed diet - suggested Mediterranean Diet with lots of fruits and vegetables, increase whole grains and beans, limit red meat, increase fish, limit refined sugar / sweets, limit saturated and trans fat, moderate amounts of monounsaturated fat and polyunsaturated fat 3.  Increase exercise as able - goal is at least 150 minutes per week 4.  RTC in 3 months to check lipids.   Time spent counseling patient:  20 minutes   Referring Provider:  Redge Gainer, MD    PharmD:  Cherre Robins, [facility]

## 2016-01-23 ENCOUNTER — Encounter: Payer: Self-pay | Admitting: Family Medicine

## 2016-01-23 ENCOUNTER — Other Ambulatory Visit: Payer: Self-pay | Admitting: *Deleted

## 2016-01-23 DIAGNOSIS — I499 Cardiac arrhythmia, unspecified: Secondary | ICD-10-CM

## 2016-01-31 ENCOUNTER — Encounter: Payer: Self-pay | Admitting: Family Medicine

## 2016-01-31 NOTE — Progress Notes (Signed)
HPI The patient presents for followup of atrial fibrillation which was documented in  2011.  I saw her in 2015 because of palpitations but an event monitor at that time did not demonstrate fibrillation.  Another monitor in Jan also did not show fib. She had some palpitations recently and was said to be in atrial fib at Dr. Tawanna Sat office.  However, by the time the EKG was taken she was in sinus and an event monitor was placed again.  I reviewed this today and again there is no fibrillation. There is atrial arrhythmias with some nonsustained atrial tachycardia. These were brief runs. 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 With this level of activity she denies pain and shortness of breath. She doesn't bring on any chest pressure, neck or arm discomfort. She doesn't have any PND or orthopnea. She's had no presyncope or syncope.  Allergies  Allergen Reactions  . Atorvastatin Other (See Comments)    myalgias   . Crestor [Rosuvastatin] Other (See Comments)    Myalgias   . Livalo [Pitavastatin] Other (See Comments)    myalgias  . Shellfish Allergy Itching, Swelling and Rash    Current Outpatient Prescriptions  Medication Sig Dispense Refill  . aspirin 81 MG tablet Take 81 mg by mouth at bedtime.     . Calcium Carbonate-Vitamin D (CALCIUM + D PO) Take 1 tablet by mouth at bedtime.    Rolena Infante Sagrada 450 MG CAPS Take 2 capsules by mouth daily.    . Cholecalciferol (VITAMIN D3) 1000 UNITS tablet Take 2,000 Units by mouth at bedtime.     Marland Kitchen diltiazem (CARDIZEM CD) 120 MG 24 hr capsule Take 1 capsule by mouth daily.    Marland Kitchen ezetimibe (ZETIA) 10 MG tablet Take 1 tablet (10 mg total) by mouth daily. 30 tablet 1  . lisinopril-hydrochlorothiazide (PRINZIDE,ZESTORETIC) 10-12.5 MG tablet Take 1 tablet by mouth daily.    . Multiple Vitamin (MULTIVITAMIN WITH MINERALS) TABS tablet Take 1 tablet by mouth at  bedtime.    . nitroGLYCERIN (NITROSTAT) 0.4 MG SL tablet Place 1 tablet (0.4 mg total) under the tongue as needed. 25 tablet 11  . omega-3 acid ethyl esters (LOVAZA) 1 G capsule Take 1 capsule (1 g total) by mouth 2 (two) times daily. 180 capsule 3  . potassium chloride (K-DUR) 10 MEQ tablet Take 10 mEq by mouth daily.    . Psyllium (NAT-RUL PSYLLIUM SEED HUSKS) 500 MG CAPS Take 2 capsules by mouth daily.     No current facility-administered medications for this visit.    Past Medical History  Diagnosis Date  . Hypertension   . Hyperlipidemia   . Osteoporosis   . Atrial fibrillation ([place])   . Arthritis     back, left shoulder, and fingers    Past Surgical History  Procedure Laterality Date  . Vaginal hysterectomy    . Cataract extraction    . Open reduction internal fixation (orif) distal radial fracture Right 12/28/2012    Procedure: OPEN REDUCTION INTERNAL FIXATION (ORIF) DISTAL RADIAL FRACTURE;  Surgeon: Hessie Dibble, MD;  Location: [facility];  Service: Orthopedics;  Laterality: Right;  . Knee arthroscopy Right 07/04/2015    Procedure: RIGHT KNEE ARTHROSCOPY WITH MEDIAL MENISCAL DEBRIDEMENT AND CHONDROPLASTY;  Surgeon: Gaynelle Arabian, MD;  Location: [facility];  Service: Orthopedics;  Laterality: Right;    ROS:  As stated in the  HPI and negative for all other systems.  PHYSICAL EXAM BP 96/60 mmHg  Pulse 82  Ht 5\' 4"  (1.626 m)  Wt 185 lb 2 oz (83.972 kg)  BMI 31.76 kg/m2 GENERAL:  Well appearing NECK:  No jugular venous distention, waveform within normal limits, carotid upstroke brisk and symmetric, no bruits, no thyromegaly LYMPHATICS:  No cervical, inguinal adenopathy LUNGS:  Clear to auscultation bilaterally CHEST:  Unremarkable HEART:  PMI not displaced or sustained,S1 and S2 within normal limits, no S3, no S4, no clicks, no rubs, no murmurs ABD:  Flat, positive bowel sounds normal in frequency in pitch, no bruits, no rebound, no guarding, no midline  pulsatile mass, no hepatomegaly, no splenomegaly EXT:  2 plus pulses throughout, no edema, no cyanosis no clubbing   ASSESSMENT AND PLAN  ATRIAL FIBRILLATION:  I again reviewed her records carefully. I reviewed the event monitors today. I looked at the previous office note and EKG. I still don't see documented recurrence of fibrillation. At this point I don't see any indication for changing her to Eliquis for Xarelto or other systemic anticoagulation. She and I have discussed this at length in the past. I will continue to be on the look out for recurrent fibrillation however.  HTN:  Her blood pressures running low and she is a little bit lightheaded at times. I've asked her to keep a blood pressure diary and she'll let me know if it continues to run low at which point I would reduce her lisinopril HCT.

## 2016-02-01 ENCOUNTER — Ambulatory Visit (INDEPENDENT_AMBULATORY_CARE_PROVIDER_SITE_OTHER): Payer: Medicare HMO | Admitting: Cardiology

## 2016-02-01 ENCOUNTER — Encounter: Payer: Self-pay | Admitting: Cardiology

## 2016-02-01 VITALS — BP 96/60 | HR 82 | Ht 64.0 in | Wt 185.1 lb

## 2016-02-01 DIAGNOSIS — I471 Supraventricular tachycardia: Secondary | ICD-10-CM

## 2016-02-01 NOTE — Patient Instructions (Signed)
Your physician wants you to follow-up in: 6 Months in [place]. You will receive a reminder letter in the mail two months in advance. If you don't receive a letter, please call our office to schedule the follow-up appointment.

## 2016-02-04 ENCOUNTER — Encounter: Payer: Self-pay | Admitting: Family Medicine

## 2016-02-13 NOTE — Progress Notes (Signed)
Patient ID: Nancy Mccormick, female   DOB: [birth year], 82 y.o.   MRN: [id number]  02/13/2016  Repatha was denied by insurance due to no documentation of CAD

## 2016-04-22 ENCOUNTER — Ambulatory Visit (INDEPENDENT_AMBULATORY_CARE_PROVIDER_SITE_OTHER): Payer: Medicare HMO | Admitting: Family Medicine

## 2016-04-22 ENCOUNTER — Encounter: Payer: Self-pay | Admitting: Family Medicine

## 2016-04-22 VITALS — BP 108/68 | HR 65 | Temp 97.3°F | Ht 64.0 in | Wt 184.0 lb

## 2016-04-22 DIAGNOSIS — I4891 Unspecified atrial fibrillation: Secondary | ICD-10-CM | POA: Diagnosis not present

## 2016-04-22 DIAGNOSIS — I10 Essential (primary) hypertension: Secondary | ICD-10-CM

## 2016-04-22 DIAGNOSIS — E785 Hyperlipidemia, unspecified: Secondary | ICD-10-CM

## 2016-04-22 DIAGNOSIS — E559 Vitamin D deficiency, unspecified: Secondary | ICD-10-CM

## 2016-04-22 NOTE — Progress Notes (Signed)
Subjective:    Patient ID: Nancy Mccormick, female    DOB: [birth year], 82 y.o.   MRN: [id number]  HPI Pt here for follow up and management of chronic medical problems which includes a fib and hyperlipidemia. She is taking medications regularly.The patient is doing well. She complains of some arthralgias in her left shoulder and arm with her old fracture. She is due to get lab work today. She is not requesting any refills. The patient denies any chest pain or shortness of breath. She still occasionally has palpitations and she may get slightly short winded when this happens. There is no pressure and Bob. She says just stopping and resting makes that go away. It may be related to some stress. She denies any problems with heartburn indigestion nausea vomiting diarrhea or blood in the stool. She is passing her water without problems. She does have problems with her left shoulder and this is from an old injury and fall in the bathtub over 15 years ago. The left shoulder and arm pain are worse when she uses it but not intolerable. She takes Tylenol and this is usually sufficient to relieve the pain at this time. She is dealing with her husband who is very sick and needs a lot of attention at home and this is been very stressful on her.     Patient Active Problem List   Diagnosis Date Noted  . Acute medial meniscal tear 07/03/2015  . Osteopenia 04/19/2014  . Palpitations 11/04/2013  . Arrhythmia 11/04/2013  . FIBRILLATION, ATRIAL 09/11/2010  . HYPERLIPIDEMIA 09/10/2010  . HYPERTENSION 09/10/2010   Outpatient Encounter Prescriptions as of 04/22/2016  Medication Sig  . aspirin 81 MG tablet Take 81 mg by mouth at bedtime.   . Calcium Carbonate-Vitamin D (CALCIUM + D PO) Take 1 tablet by mouth at bedtime.  Rolena Infante Sagrada 450 MG CAPS Take 2 capsules by mouth daily.  . Cholecalciferol (VITAMIN D3) 1000 UNITS tablet Take 2,000 Units by mouth at bedtime.   Marland Kitchen diltiazem (CARDIZEM CD) 120 MG 24 hr  capsule Take 1 capsule by mouth daily.  Marland Kitchen ezetimibe (ZETIA) 10 MG tablet Take 1 tablet (10 mg total) by mouth daily.  Marland Kitchen lisinopril-hydrochlorothiazide (PRINZIDE,ZESTORETIC) 10-12.5 MG tablet Take 1 tablet by mouth daily.  . Multiple Vitamin (MULTIVITAMIN WITH MINERALS) TABS tablet Take 1 tablet by mouth at bedtime.  Marland Kitchen omega-3 acid ethyl esters (LOVAZA) 1 G capsule Take 1 capsule (1 g total) by mouth 2 (two) times daily.  . potassium chloride (K-DUR) 10 MEQ tablet Take 10 mEq by mouth daily.  . Psyllium (NAT-RUL PSYLLIUM SEED HUSKS) 500 MG CAPS Take 2 capsules by mouth daily.  . nitroGLYCERIN (NITROSTAT) 0.4 MG SL tablet Place 1 tablet (0.4 mg total) under the tongue as needed. (Patient not taking: Reported on 04/22/2016)   No facility-administered encounter medications on file as of 04/22/2016.      Review of Systems  Constitutional: Negative.   HENT: Negative.   Eyes: Negative.   Respiratory: Negative.   Cardiovascular: Negative.   Gastrointestinal: Negative.   Endocrine: Negative.   Genitourinary: Negative.   Musculoskeletal: Positive for arthralgias (left shoulder and arm pain).  Skin: Negative.   Allergic/Immunologic: Negative.   Neurological: Negative.   Hematological: Negative.   Psychiatric/Behavioral: Negative.        Objective:   Physical Exam  Constitutional: She is oriented to person, place, and time. She appears well-developed and well-nourished.  Pleasant and alert  HENT:  Head: Normocephalic and atraumatic.  Right Ear: External ear normal.  Left Ear: External ear normal.  Nose: Nose normal.  Mouth/Throat: Oropharynx is clear and moist.  Minimal wax right EAC  Eyes: Conjunctivae and EOM are normal. Pupils are equal, round, and reactive to light. Right eye exhibits no discharge. Left eye exhibits no discharge. No scleral icterus.  Neck: Normal range of motion. Neck supple. No thyromegaly present.  No Bruits thyromegaly or anterior cervical adenopathy    Cardiovascular: Normal rate, regular rhythm, normal heart sounds and intact distal pulses.   No murmur heard. Heart has a regular rate and rhythm at 60/m  Pulmonary/Chest: Effort normal and breath sounds normal. No respiratory distress. She has no wheezes. She has no rales. She exhibits no tenderness.  Clear anteriorly and posteriorly  Abdominal: Soft. Bowel sounds are normal. She exhibits no mass. There is no tenderness. There is no rebound and no guarding.  No abdominal tenderness masses or bruits  Musculoskeletal: Normal range of motion. She exhibits no edema.  Lymphadenopathy:    She has no cervical adenopathy.  Neurological: She is alert and oriented to person, place, and time. She has normal reflexes. No cranial nerve deficit.  Skin: Skin is warm and dry. No rash noted.  Psychiatric: She has a normal mood and affect. Her behavior is normal. Judgment and thought content normal.  Nursing note and vitals reviewed.   BP 108/68 mmHg  Pulse 65  Temp(Src) 97.3 F (36.3 C) (Oral)  Ht 5' 4"  (1.626 m)  Wt 184 lb (83.462 kg)  BMI 31.57 kg/m2       Assessment & Plan:  1. Hyperlipidemia -Continue with aggressive therapeutic lifestyle changes - CBC with Differential/Platelet - NMR, lipoprofile  2. Essential hypertension, benign -The blood pressure is good today and she will continue with current treatment - CBC with Differential/Platelet - BMP8+EGFR - Hepatic function panel  3. Vitamin D deficiency -Continue with current treatment pending results of lab work - CBC with Differential/Platelet - VITAMIN D 25 Hydroxy (Vit-D Deficiency, Fractures)  4. Atrial fibrillation, unspecified -Follow-up with cardiology as needed and avoid caffeine - CBC with Differential/Platelet  Patient Instructions                       Medicare Annual Wellness Visit  [facility] and the medical providers at [facility] strive to bring you the best medical care.  In doing so  we not only want to address your current medical conditions and concerns but also to detect new conditions early and prevent illness, disease and health-related problems.    Medicare offers a yearly Wellness Visit which allows our clinical staff to assess your need for preventative services including immunizations, lifestyle education, counseling to decrease risk of preventable diseases and screening for fall risk and other medical concerns.    This visit is provided free of charge (no copay) for all Medicare recipients. The clinical pharmacists at [facility] have begun to conduct these Wellness Visits which will also include a thorough review of all your medications.    As you primary medical provider recommend that you make an appointment for your Annual Wellness Visit if you have not done so already this year.  You may set up this appointment before you leave today or you may call back [phone]) and schedule an appointment.  Please make sure when you call that you mention that you are scheduling your Annual Wellness Visit with the clinical pharmacist so that the appointment may  be made for the proper length of time.     Continue current medications. Continue good therapeutic lifestyle changes which include good diet and exercise. Fall precautions discussed with patient. If an FOBT was given today- please return it to our front desk. If you are over 24 years old - you may need Prevnar 51 or the adult Pneumonia vaccine.   After your visit with [place] today you will receive a survey in the mail or online from [employer] regarding your care with [place]. Please take a moment to fill this out. Your feedback is very important to [place] as you can help [place] better understand your patient needs as well as improve your experience and satisfaction. WE CARE ABOUT YOU!!!   The patient should stay as active as possible. She should follow-up aggressive therapeutic lifestyle changes which include  diet and exercise and especially because she cannot tolerate statin drugs.  She should be careful and not place herself at risk for falling She should drink plenty of fluids and stay well hydrated     Arrie Senate MD

## 2016-04-22 NOTE — Patient Instructions (Addendum)
Medicare Annual Wellness Visit  [facility] and the medical providers at [facility] strive to bring you the best medical care.  In doing so we not only want to address your current medical conditions and concerns but also to detect new conditions early and prevent illness, disease and health-related problems.    Medicare offers a yearly Wellness Visit which allows our clinical staff to assess your need for preventative services including immunizations, lifestyle education, counseling to decrease risk of preventable diseases and screening for fall risk and other medical concerns.    This visit is provided free of charge (no copay) for all Medicare recipients. The clinical pharmacists at [facility] have begun to conduct these Wellness Visits which will also include a thorough review of all your medications.    As you primary medical provider recommend that you make an appointment for your Annual Wellness Visit if you have not done so already this year.  You may set up this appointment before you leave today or you may call back [phone] and schedule an appointment.  Please make sure when you call that you mention that you are scheduling your Annual Wellness Visit with the clinical pharmacist so that the appointment may be made for the proper length of time.     Continue current medications. Continue good therapeutic lifestyle changes which include good diet and exercise. Fall precautions discussed with patient. If an FOBT was given today- please return it to our front desk. If you are over 16 years old - you may need Prevnar 66 or the adult Pneumonia vaccine.   After your visit with [place] today you will receive a survey in the mail or online from [employer] regarding your care with [place]. Please take a moment to fill this out. Your feedback is very important to [place] as you can help [place] better understand your patient needs as well as  improve your experience and satisfaction. WE CARE ABOUT YOU!!!   The patient should stay as active as possible. She should follow-up aggressive therapeutic lifestyle changes which include diet and exercise and especially because she cannot tolerate statin drugs.  She should be careful and not place herself at risk for falling She should drink plenty of fluids and stay well hydrated

## 2016-04-23 LAB — CBC WITH DIFFERENTIAL/PLATELET
BASOS: 0 %
Basophils Absolute: 0 10*3/uL (ref 0.0–0.2)
EOS (ABSOLUTE): 0.2 10*3/uL (ref 0.0–0.4)
Eos: 3 %
HEMATOCRIT: 38.7 % (ref 34.0–46.6)
Hemoglobin: 14.3 g/dL (ref 11.1–15.9)
IMMATURE GRANULOCYTES: 0 %
Immature Grans (Abs): 0 10*3/uL (ref 0.0–0.1)
LYMPHS ABS: 2.1 10*3/uL (ref 0.7–3.1)
Lymphs: 29 %
MCH: 34.1 pg — ABNORMAL HIGH (ref 26.6–33.0)
MCHC: 37 g/dL — ABNORMAL HIGH (ref 31.5–35.7)
MCV: 92 fL (ref 79–97)
MONOS ABS: 0.6 10*3/uL (ref 0.1–0.9)
Monocytes: 8 %
NEUTROS PCT: 60 %
Neutrophils Absolute: 4.2 10*3/uL (ref 1.4–7.0)
PLATELETS: 359 10*3/uL (ref 150–379)
RBC: 4.19 x10E6/uL (ref 3.77–5.28)
RDW: 14 % (ref 12.3–15.4)
WBC: 7.1 10*3/uL (ref 3.4–10.8)

## 2016-04-23 LAB — NMR, LIPOPROFILE
CHOLESTEROL: 231 mg/dL — AB (ref 100–199)
HDL CHOLESTEROL BY NMR: 69 mg/dL (ref >39)
HDL PARTICLE NUMBER: 39.4 umol/L (ref >=30.5)
LDL Particle Number: 1818 nmol/L — ABNORMAL HIGH (ref <1000)
LDL SIZE: 21.3 nm (ref >20.5)
LDL-C: 133 mg/dL — ABNORMAL HIGH (ref 0–99)
LP-IR Score: 46 — ABNORMAL HIGH (ref <=45)
SMALL LDL PARTICLE NUMBER: 554 nmol/L — AB (ref <=527)
TRIGLYCERIDES BY NMR: 147 mg/dL (ref 0–149)

## 2016-04-23 LAB — HEPATIC FUNCTION PANEL
ALBUMIN: 4.2 g/dL (ref 3.5–4.8)
ALT: 18 IU/L (ref 0–32)
AST: 14 IU/L (ref 0–40)
Alkaline Phosphatase: 83 IU/L (ref 39–117)
BILIRUBIN TOTAL: 0.6 mg/dL (ref 0.0–1.2)
BILIRUBIN, DIRECT: 0.15 mg/dL (ref 0.00–0.40)
Total Protein: 6.6 g/dL (ref 6.0–8.5)

## 2016-04-23 LAB — BMP8+EGFR
BUN/Creatinine Ratio: 21 (ref 12–28)
BUN: 16 mg/dL (ref 8–27)
CALCIUM: 9.3 mg/dL (ref 8.7–10.3)
CHLORIDE: 103 mmol/L (ref 96–106)
CO2: 23 mmol/L (ref 18–29)
Creatinine, Ser: 0.76 mg/dL (ref 0.57–1.00)
GFR calc Af Amer: 86 mL/min/{1.73_m2} (ref >59)
GFR calc non Af Amer: 75 mL/min/{1.73_m2} (ref >59)
Glucose: 96 mg/dL (ref 65–99)
POTASSIUM: 4.2 mmol/L (ref 3.5–5.2)
Sodium: 144 mmol/L (ref 134–144)

## 2016-04-23 LAB — VITAMIN D 25 HYDROXY (VIT D DEFICIENCY, FRACTURES): Vit D, 25-Hydroxy: 30.9 ng/mL (ref 30.0–100.0)

## 2016-06-05 ENCOUNTER — Ambulatory Visit (INDEPENDENT_AMBULATORY_CARE_PROVIDER_SITE_OTHER): Payer: Medicare HMO

## 2016-06-05 ENCOUNTER — Ambulatory Visit (INDEPENDENT_AMBULATORY_CARE_PROVIDER_SITE_OTHER): Payer: Medicare HMO | Admitting: Family

## 2016-06-05 ENCOUNTER — Encounter: Payer: Self-pay | Admitting: Family

## 2016-06-05 ENCOUNTER — Other Ambulatory Visit: Payer: Self-pay | Admitting: Family

## 2016-06-05 VITALS — BP 113/70 | HR 71 | Temp 97.4°F | Ht 64.0 in | Wt 180.0 lb

## 2016-06-05 DIAGNOSIS — M25561 Pain in right knee: Secondary | ICD-10-CM

## 2016-06-05 DIAGNOSIS — S82034A Nondisplaced transverse fracture of right patella, initial encounter for closed fracture: Secondary | ICD-10-CM

## 2016-06-05 DIAGNOSIS — S8001XA Contusion of right knee, initial encounter: Secondary | ICD-10-CM | POA: Diagnosis not present

## 2016-06-05 MED ORDER — TRAMADOL HCL 50 MG PO TABS: 50.0000 mg | ORAL_TABLET | Freq: Three times a day (TID) | ORAL | 0 refills | Status: DC | PRN

## 2016-06-05 MED ORDER — NAPROXEN 500 MG PO TABS: 500.0000 mg | ORAL_TABLET | Freq: Two times a day (BID) | ORAL | 1 refills | Status: DC

## 2016-06-05 NOTE — Addendum Note (Signed)
Addended by: Evelina Dun A on: 06/05/2016 03:00 PM   Modules accepted: Orders

## 2016-06-05 NOTE — Patient Instructions (Signed)

## 2016-06-05 NOTE — Progress Notes (Addendum)
   Subjective:    Patient ID: Nancy Mccormick, female    DOB: 03/31/1934, 82 y.o.   MRN: [id number]  Knee Pain   The incident occurred 2 days ago. The injury mechanism was a fall. The pain is present in the right knee. The quality of the pain is described as aching. The pain is at a severity of 10/10. The pain is moderate. The pain has been intermittent since onset. Associated symptoms include an inability to bear weight and muscle weakness. Pertinent negatives include no numbness or tingling. She reports no foreign bodies present. The symptoms are aggravated by movement and weight bearing. She has tried acetaminophen, ice, rest and non-weight bearing for the symptoms. The treatment provided mild relief.      Review of Systems  Musculoskeletal: Positive for joint swelling.       Knee pain   Neurological: Negative for tingling and numbness.  All other systems reviewed and are negative.      Objective:   Physical Exam  Constitutional: She is oriented to person, place, and time. She appears well-developed and well-nourished. No distress.  HENT:  Head: Normocephalic.  Eyes: Pupils are equal, round, and reactive to light.  Neck: Normal range of motion. Neck supple. No thyromegaly present.  Cardiovascular: Normal rate, regular rhythm, normal heart sounds and intact distal pulses.   No murmur heard. Pulmonary/Chest: Effort normal and breath sounds normal. No respiratory distress. She has no wheezes.  Abdominal: Soft. Bowel sounds are normal. She exhibits no distension. There is no tenderness.  Musculoskeletal: Normal range of motion. She exhibits edema (2+ in right knee) and tenderness (pain with flexion, extension, and any weight bearing).  Neurological: She is alert and oriented to person, place, and time.  Skin: Skin is warm and dry.  Psychiatric: She has a normal mood and affect. Her behavior is normal. Judgment and thought content normal.  Vitals reviewed.  BP 113/70   Pulse 71    Temp 97.4 F (36.3 C) (Oral)   Ht 5\' 4"  (1.626 m)   Wt 180 lb (81.6 kg)   BMI 30.90 kg/m   X-ray- nondisplaced fracture of the inferior patella, moderate swelling Preliminary reading by Evelina Dun, FNP [facility]      Assessment & Plan:  1. Right knee pain - DG Knee 1-2 Views Right; Future - naproxen (NAPROSYN) 500 MG tablet; Take 1 tablet (500 mg total) by mouth 2 (two) times daily with a meal.  Dispense: 60 tablet; Refill: 1  2. Contusion, knee, right, initial encounter -Rest -Ice -Keep elevated If does not improve will need ct scan - naproxen (NAPROSYN) 500 MG tablet; Take 1 tablet (500 mg total) by mouth 2 (two) times daily with a meal.  Dispense: 60 tablet; Refill: 1 - traMADol (ULTRAM) 50 MG tablet; Take 1-2 tablets (50-100 mg total) by mouth every 8 (eight) hours as needed.  Dispense: 60 tablet; Refill: 0  Evelina Dun, FNP

## 2016-06-10 DIAGNOSIS — S82034A Nondisplaced transverse fracture of right patella, initial encounter for closed fracture: Secondary | ICD-10-CM | POA: Diagnosis not present

## 2016-07-08 DIAGNOSIS — S82034D Nondisplaced transverse fracture of right patella, subsequent encounter for closed fracture with routine healing: Secondary | ICD-10-CM | POA: Diagnosis not present

## 2016-07-17 ENCOUNTER — Encounter: Payer: Self-pay | Admitting: *Deleted

## 2016-07-29 NOTE — Progress Notes (Signed)
HPI The patient presents for followup of atrial fibrillation which was documented in  2011.  I saw her in 2015 because of palpitations but an event monitor at that time did not demonstrate fibrillation.  Another monitor in Jan also did not show fib. She had some palpitations recently and was said to be in atrial fib at Dr. Tawanna Sat office.  However, by the time the EKG was taken she was in sinus and an event monitor was placed again.  I reviewed this and again there is no fibrillation.  She returns for follow up.  She has since had only rare palpitations when she becomes stressed. She's had no presyncope or syncope. She did slip and have a fall and injured her knee. She is just recovering from this and is now back pedaling a stationary bicycle. She has no cardiac complaints with this.  Allergies  Allergen Reactions  . Atorvastatin Other (See Comments)    myalgias   . Crestor [Rosuvastatin] Other (See Comments)    Myalgias   . Livalo [Pitavastatin] Other (See Comments)    myalgias  . Shellfish Allergy Itching, Swelling and Rash    Current Outpatient Prescriptions  Medication Sig Dispense Refill  . aspirin 81 MG tablet Take 81 mg by mouth at bedtime.     . Calcium Carbonate-Vitamin D (CALCIUM + D PO) Take 1 tablet by mouth at bedtime.    Rolena Infante Sagrada 450 MG CAPS Take 2 capsules by mouth daily.    . Cholecalciferol (VITAMIN D3) 1000 UNITS tablet Take 2,000 Units by mouth at bedtime.     Marland Kitchen diltiazem (CARDIZEM CD) 120 MG 24 hr capsule Take 1 capsule by mouth daily.    Marland Kitchen lisinopril-hydrochlorothiazide (PRINZIDE,ZESTORETIC) 10-12.5 MG tablet Take 1 tablet by mouth daily.    . Multiple Vitamin (MULTIVITAMIN WITH MINERALS) TABS tablet Take 1 tablet by mouth at bedtime.    . nitroGLYCERIN (NITROSTAT) 0.4 MG SL tablet Place 1 tablet (0.4 mg total) under the tongue as needed. 25 tablet 11  . omega-3 acid ethyl esters (LOVAZA) 1 G capsule Take 1 capsule (1 g total) by mouth 2 (two) times  daily. 180 capsule 3  . potassium chloride (K-DUR) 10 MEQ tablet Take 10 mEq by mouth daily.    . Psyllium (NAT-RUL PSYLLIUM SEED HUSKS) 500 MG CAPS Take 2 capsules by mouth daily.     No current facility-administered medications for this visit.     Past Medical History:  Diagnosis Date  . Arthritis    back, left shoulder, and fingers  . Atrial fibrillation ([place])   . Hyperlipidemia   . Hypertension   . Osteoporosis     Past Surgical History:  Procedure Laterality Date  . CATARACT EXTRACTION    . KNEE ARTHROSCOPY Right 07/04/2015   Procedure: RIGHT KNEE ARTHROSCOPY WITH MEDIAL MENISCAL DEBRIDEMENT AND CHONDROPLASTY;  Surgeon: Gaynelle Arabian, MD;  Location: [facility];  Service: Orthopedics;  Laterality: Right;  . OPEN REDUCTION INTERNAL FIXATION (ORIF) DISTAL RADIAL FRACTURE Right 12/28/2012   Procedure: OPEN REDUCTION INTERNAL FIXATION (ORIF) DISTAL RADIAL FRACTURE;  Surgeon: Hessie Dibble, MD;  Location: [facility];  Service: Orthopedics;  Laterality: Right;  Marland Kitchen VAGINAL HYSTERECTOMY      ROS:   As stated in the HPI and negative for all other systems.  PHYSICAL EXAM BP (!) 167/87   Pulse 71   Ht 5\' 4"  (1.626 m)   Wt 182 lb (82.6 kg)   BMI 31.24 kg/m  GENERAL:  Well appearing NECK:  No jugular venous distention, waveform within normal limits, carotid upstroke brisk and symmetric, no bruits, no thyromegaly LUNGS:  Clear to auscultation bilaterally CHEST:  Unremarkable HEART:  PMI not displaced or sustained,S1 and S2 within normal limits, no S3, no S4, no clicks, no rubs, no murmurs ABD:  Flat, positive bowel sounds normal in frequency in pitch, no bruits, no rebound, no guarding, no midline pulsatile mass, no hepatomegaly, no splenomegaly EXT:  2 plus pulses throughout, no edema, no cyanosis no clubbing  EKG:  Ectopic atrial rhythm, short PR interval an unusual P-wave axis, QRS axis within normal limits, low voltage in the limb and precordial leads, no acute ST-T  wave changes.  07/30/2016  ASSESSMENT AND PLAN  ATRIAL FIBRILLATION:  I have in the past carefully reviewed all rhythm strips and monitors that she had. I still don't see documented recurrence of fibrillation. At this point I don't see any indication for changing her to Eliquis or Xarelto or other systemic anticoagulation. She and I have discussed this at length in the past. I will continue to be on the look out for recurrent fibrillation however.  She understands that if she has increasing palpitations in the future she'll need to let me know and I'll have a low threshold from another monitor  HTN:  Her blood pressures running high today. However it usually low when she's had symptoms with this. No change in therapy is indicated.

## 2016-07-30 ENCOUNTER — Ambulatory Visit (INDEPENDENT_AMBULATORY_CARE_PROVIDER_SITE_OTHER): Payer: Medicare HMO | Admitting: Cardiology

## 2016-07-30 ENCOUNTER — Encounter: Payer: Self-pay | Admitting: Cardiology

## 2016-07-30 VITALS — BP 167/87 | HR 71 | Ht 64.0 in | Wt 182.0 lb

## 2016-07-30 DIAGNOSIS — R002 Palpitations: Secondary | ICD-10-CM

## 2016-07-30 NOTE — Patient Instructions (Signed)
Medication Instructions:  The current medical regimen is effective;  continue present plan and medications.  Follow-Up: Follow up as needed with Dr Hochrein.  Thank you for choosing [facility]!!     

## 2016-09-03 ENCOUNTER — Ambulatory Visit: Payer: Medicare HMO | Admitting: Family Medicine

## 2016-09-05 ENCOUNTER — Other Ambulatory Visit: Payer: Self-pay | Admitting: Family Medicine

## 2016-09-07 DIAGNOSIS — Z0000 Encounter for general adult medical examination without abnormal findings: Secondary | ICD-10-CM | POA: Diagnosis not present

## 2016-09-07 DIAGNOSIS — I10 Essential (primary) hypertension: Secondary | ICD-10-CM | POA: Diagnosis not present

## 2016-09-24 ENCOUNTER — Ambulatory Visit (INDEPENDENT_AMBULATORY_CARE_PROVIDER_SITE_OTHER): Payer: Medicare HMO | Admitting: Family Medicine

## 2016-09-24 ENCOUNTER — Ambulatory Visit (INDEPENDENT_AMBULATORY_CARE_PROVIDER_SITE_OTHER): Payer: Medicare HMO

## 2016-09-24 ENCOUNTER — Encounter: Payer: Self-pay | Admitting: Family Medicine

## 2016-09-24 VITALS — BP 178/88 | HR 71 | Temp 97.2°F | Ht 64.0 in | Wt 181.0 lb

## 2016-09-24 DIAGNOSIS — H6123 Impacted cerumen, bilateral: Secondary | ICD-10-CM | POA: Diagnosis not present

## 2016-09-24 DIAGNOSIS — Z780 Asymptomatic menopausal state: Secondary | ICD-10-CM

## 2016-09-24 DIAGNOSIS — Z13820 Encounter for screening for osteoporosis: Secondary | ICD-10-CM

## 2016-09-24 DIAGNOSIS — E559 Vitamin D deficiency, unspecified: Secondary | ICD-10-CM

## 2016-09-24 DIAGNOSIS — F4320 Adjustment disorder, unspecified: Secondary | ICD-10-CM

## 2016-09-24 DIAGNOSIS — E7800 Pure hypercholesterolemia, unspecified: Secondary | ICD-10-CM | POA: Diagnosis not present

## 2016-09-24 DIAGNOSIS — R69 Illness, unspecified: Secondary | ICD-10-CM | POA: Diagnosis not present

## 2016-09-24 DIAGNOSIS — I10 Essential (primary) hypertension: Secondary | ICD-10-CM

## 2016-09-24 NOTE — Patient Instructions (Addendum)
Medicare Annual Wellness Visit  [facility] and the medical providers at [facility] strive to bring you the best medical care.  In doing so we not only want to address your current medical conditions and concerns but also to detect new conditions early and prevent illness, disease and health-related problems.    Medicare offers a yearly Wellness Visit which allows our clinical staff to assess your need for preventative services including immunizations, lifestyle education, counseling to decrease risk of preventable diseases and screening for fall risk and other medical concerns.    This visit is provided free of charge (no copay) for all Medicare recipients. The clinical pharmacists at [facility] have begun to conduct these Wellness Visits which will also include a thorough review of all your medications.    As you primary medical provider recommend that you make an appointment for your Annual Wellness Visit if you have not done so already this year.  You may set up this appointment before you leave today or you may call back [phone] and schedule an appointment.  Please make sure when you call that you mention that you are scheduling your Annual Wellness Visit with the clinical pharmacist so that the appointment may be made for the proper length of time.     Continue current medications. Continue good therapeutic lifestyle changes which include good diet and exercise. Fall precautions discussed with patient. If an FOBT was given today- please return it to our front desk. If you are over 18 years old - you may need Prevnar 59 or the adult Pneumonia vaccine.  **Flu shots are available--- please call and schedule a FLU-CLINIC appointment**  After your visit with [place] today you will receive a survey in the mail or online from [employer] regarding your care with [place]. Please take a moment to fill this out. Your feedback is very  important to [place] as you can help [place] better understand your patient needs as well as improve your experience and satisfaction. WE CARE ABOUT YOU!!!   The patient should follow-up with cardiology as needed and this was discussed with her by them. She was instructed to try to get as much rest as she can say she can be the caregiver she needs to be.

## 2016-09-24 NOTE — Progress Notes (Signed)
Subjective:    Patient ID: Nancy Mccormick, female    DOB: 1934/05/02, 82 y.o.   MRN: [id number]  HPI Pt here for follow up and management of chronic medical problems which includes hypertension an hyperlipidemia. She is taking medications regularly.The patient is doing well overall. She mostly complains of stress and being a caregiver and worrying about her husband. She does have good support from her son's. She should pleasant and matter of fact. She is dealing with the situation well and does not think that she needs anything to keep her calm and relaxed. She denies any chest pain pressure or palpitations. She denies any shortness of breath. She does have occasional indigestion and only takes a Zantac if needed and this does not happen but once a month. She is passing her water without problems. She will get a DEXA scan today and will get lab work today a traditional panel.    Patient Active Problem List   Diagnosis Date Noted  . Acute medial meniscal tear 07/03/2015  . Osteopenia 04/19/2014  . Palpitations 11/04/2013  . Arrhythmia 11/04/2013  . FIBRILLATION, ATRIAL 09/11/2010  . HYPERLIPIDEMIA 09/10/2010  . HYPERTENSION 09/10/2010   Outpatient Encounter Prescriptions as of 09/24/2016  Medication Sig  . aspirin 81 MG tablet Take 81 mg by mouth at bedtime.   . Calcium Carbonate-Vitamin D (CALCIUM + D PO) Take 1 tablet by mouth at bedtime.  Rolena Infante Sagrada 450 MG CAPS Take 2 capsules by mouth daily.  . Cholecalciferol (VITAMIN D3) 1000 UNITS tablet Take 2,000 Units by mouth at bedtime.   Marland Kitchen diltiazem (CARDIZEM CD) 120 MG 24 hr capsule Take 1 capsule by mouth daily.  Marland Kitchen lisinopril-hydrochlorothiazide (PRINZIDE,ZESTORETIC) 10-12.5 MG tablet Take 1 tablet by mouth daily.  . Multiple Vitamin (MULTIVITAMIN WITH MINERALS) TABS tablet Take 1 tablet by mouth at bedtime.  Marland Kitchen omega-3 acid ethyl esters (LOVAZA) 1 G capsule Take 1 capsule (1 g total) by mouth 2 (two) times daily.  . potassium  chloride (K-DUR) 10 MEQ tablet TAKE 1 TABLET DAILY AT BEDTIME  . Psyllium (NAT-RUL PSYLLIUM SEED HUSKS) 500 MG CAPS Take 2 capsules by mouth daily.  . [DISCONTINUED] potassium chloride (K-DUR) 10 MEQ tablet Take 10 mEq by mouth daily.  . nitroGLYCERIN (NITROSTAT) 0.4 MG SL tablet Place 1 tablet (0.4 mg total) under the tongue as needed. (Patient not taking: Reported on 09/24/2016)   No facility-administered encounter medications on file as of 09/24/2016.       Review of Systems  Constitutional: Negative.   HENT: Negative.   Eyes: Negative.   Respiratory: Negative.   Cardiovascular: Negative.   Gastrointestinal: Negative.   Endocrine: Negative.   Genitourinary: Negative.   Musculoskeletal: Negative.   Skin: Negative.   Allergic/Immunologic: Negative.   Neurological: Negative.   Hematological: Negative.   Psychiatric/Behavioral: Negative.        Objective:   Physical Exam  Constitutional: She is oriented to person, place, and time. She appears well-developed and well-nourished. No distress.  The patient is smiling and pleasant and alert.  HENT:  Head: Normocephalic and atraumatic.  Nose: Nose normal.  Mouth/Throat: Oropharynx is clear and moist.  Bilateral ear cerumen  Eyes: Conjunctivae and EOM are normal. Pupils are equal, round, and reactive to light. Right eye exhibits no discharge. Left eye exhibits no discharge. No scleral icterus.  Neck: Normal range of motion. Neck supple. No thyromegaly present.  No bruits thyromegaly or anterior cervical adenopathy  Cardiovascular: Normal rate, normal heart sounds and intact  distal pulses.   No murmur heard. The heart has a slightly irregular rate and rhythm at 72/m  Pulmonary/Chest: Effort normal and breath sounds normal. No respiratory distress. She has no wheezes. She has no rales.  Clear anteriorly and posteriorly  Abdominal: Soft. Bowel sounds are normal. She exhibits no mass. There is no tenderness. There is no rebound and no  guarding.  No abdominal tenderness masses or organ enlargement or bruits  Musculoskeletal: Normal range of motion. She exhibits no edema.  Lymphadenopathy:    She has no cervical adenopathy.  Neurological: She is alert and oriented to person, place, and time. She has normal reflexes. No cranial nerve deficit.  Skin: Skin is warm and dry. No rash noted.  Psychiatric: She has a normal mood and affect. Her behavior is normal. Judgment and thought content normal.  Nursing note and vitals reviewed.  BP (!) 163/94 (BP Location: Right Arm)   Pulse 71   Temp 97.2 F (36.2 C) (Oral)   Ht 5' 4"  (1.626 m)   Wt 181 lb (82.1 kg)   BMI 31.07 kg/m   Repeat blood pressure 178/88 in the right arm sitting      Assessment & Plan:  1. Pure hypercholesterolemia -Continue with aggressive therapeutic lifestyle changes pending results of lab work - CBC with Differential/Platelet - Lipid panel  2. Essential hypertension, benign -The patient says that her blood pressures at home run in the 1:30 to 135 range over the 80s and she will continue to monitor this at home. Both of the readings here were elevated but I will not change her medicines based on what she has been getting at home. - CBC with Differential/Platelet - BMP8+EGFR - Hepatic function panel  3. Vitamin D deficiency -Continue current treatment pending results of lab work - CBC with Differential/Platelet - VITAMIN D 25 Hydroxy (Vit-D Deficiency, Fractures) - DG [facility] DEXA; Future  4. Screening for osteoporosis - DG [facility] DEXA; Future  5. Postmenopausal - DG [facility] DEXA; Future  6. Adjustment disorder, unspecified type -Continue to avoid caffeine and try to get some time to relax. The patient currently does not want to take any kind of antidepressant medicine.  7. Bilateral ear cerumen -Ears were irrigated while she was in the office.  Patient Instructions                       Medicare Annual Wellness Visit  [facility] and the  medical providers at [facility] strive to bring you the best medical care.  In doing so we not only want to address your current medical conditions and concerns but also to detect new conditions early and prevent illness, disease and health-related problems.    Medicare offers a yearly Wellness Visit which allows our clinical staff to assess your need for preventative services including immunizations, lifestyle education, counseling to decrease risk of preventable diseases and screening for fall risk and other medical concerns.    This visit is provided free of charge (no copay) for all Medicare recipients. The clinical pharmacists at [facility] have begun to conduct these Wellness Visits which will also include a thorough review of all your medications.    As you primary medical provider recommend that you make an appointment for your Annual Wellness Visit if you have not done so already this year.  You may set up this appointment before you leave today or you may call back [phone] and schedule an appointment.  Please make sure when you call that you mention that you are scheduling your Annual Wellness Visit with the clinical pharmacist so that the appointment may be made for the proper length of time.     Continue current medications. Continue good therapeutic lifestyle changes which include good diet and exercise. Fall precautions discussed with patient. If an FOBT was given today- please return it to our front desk. If you are over 66 years old - you may need Prevnar 20 or the adult Pneumonia vaccine.  **Flu shots are available--- please call and schedule a FLU-CLINIC appointment**  After your visit with [place] today you will receive a survey in the mail or online from [employer] regarding your care with [place]. Please take a moment to fill this out. Your feedback is very important to [place] as you can help [place] better understand your patient needs as well  as improve your experience and satisfaction. WE CARE ABOUT YOU!!!   The patient should follow-up with cardiology as needed and this was discussed with her by them. She was instructed to try to get as much rest as she can say she can be the caregiver she needs to be.    Arrie Senate MD

## 2016-09-25 LAB — CBC WITH DIFFERENTIAL/PLATELET
Basophils Absolute: 0 10*3/uL (ref 0.0–0.2)
Basos: 0 %
EOS (ABSOLUTE): 0.3 10*3/uL (ref 0.0–0.4)
EOS: 4 %
HEMATOCRIT: 40.4 % (ref 34.0–46.6)
HEMOGLOBIN: 13.8 g/dL (ref 11.1–15.9)
IMMATURE GRANS (ABS): 0 10*3/uL (ref 0.0–0.1)
IMMATURE GRANULOCYTES: 0 %
LYMPHS: 25 %
Lymphocytes Absolute: 2.3 10*3/uL (ref 0.7–3.1)
MCH: 32.7 pg (ref 26.6–33.0)
MCHC: 34.2 g/dL (ref 31.5–35.7)
MCV: 96 fL (ref 79–97)
MONOCYTES: 11 %
Monocytes Absolute: 1 10*3/uL — ABNORMAL HIGH (ref 0.1–0.9)
NEUTROS PCT: 60 %
Neutrophils Absolute: 5.6 10*3/uL (ref 1.4–7.0)
Platelets: 367 10*3/uL (ref 150–379)
RBC: 4.22 x10E6/uL (ref 3.77–5.28)
RDW: 14.3 % (ref 12.3–15.4)
WBC: 9.4 10*3/uL (ref 3.4–10.8)

## 2016-09-25 LAB — HEPATIC FUNCTION PANEL
ALBUMIN: 4.3 g/dL (ref 3.5–4.7)
ALT: 15 IU/L (ref 0–32)
AST: 12 IU/L (ref 0–40)
Alkaline Phosphatase: 86 IU/L (ref 39–117)
BILIRUBIN, DIRECT: 0.14 mg/dL (ref 0.00–0.40)
Bilirubin Total: 0.5 mg/dL (ref 0.0–1.2)
Total Protein: 6.6 g/dL (ref 6.0–8.5)

## 2016-09-25 LAB — LIPID PANEL
CHOL/HDL RATIO: 3.9 ratio (ref 0.0–4.4)
Cholesterol, Total: 263 mg/dL — ABNORMAL HIGH (ref 100–199)
HDL: 68 mg/dL (ref >39)
LDL CALC: 144 mg/dL — AB (ref 0–99)
Triglycerides: 253 mg/dL — ABNORMAL HIGH (ref 0–149)
VLDL Cholesterol Cal: 51 mg/dL — ABNORMAL HIGH (ref 5–40)

## 2016-09-25 LAB — BMP8+EGFR
BUN/Creatinine Ratio: 16 (ref 12–28)
BUN: 13 mg/dL (ref 8–27)
CO2: 25 mmol/L (ref 18–29)
Calcium: 9 mg/dL (ref 8.7–10.3)
Chloride: 103 mmol/L (ref 96–106)
Creatinine, Ser: 0.8 mg/dL (ref 0.57–1.00)
GFR, EST AFRICAN AMERICAN: 81 mL/min/{1.73_m2} (ref >59)
GFR, EST NON AFRICAN AMERICAN: 70 mL/min/{1.73_m2} (ref >59)
Glucose: 93 mg/dL (ref 65–99)
Potassium: 4.7 mmol/L (ref 3.5–5.2)
Sodium: 144 mmol/L (ref 134–144)

## 2016-09-25 LAB — VITAMIN D 25 HYDROXY (VIT D DEFICIENCY, FRACTURES): Vit D, 25-Hydroxy: 33.5 ng/mL (ref 30.0–100.0)

## 2016-09-26 ENCOUNTER — Telehealth: Payer: Self-pay | Admitting: Family Medicine

## 2016-09-26 MED ORDER — AMOXICILLIN 500 MG PO CAPS: 500.0000 mg | ORAL_CAPSULE | Freq: Three times a day (TID) | ORAL | 0 refills | Status: DC

## 2016-09-26 NOTE — Telephone Encounter (Signed)
Patient aware of results and verbalizes understanding. Rx sent to pharmacy for amoxil.

## 2016-09-26 NOTE — Telephone Encounter (Signed)
Amoxicillin 500 mg 3 times a day #21

## 2016-10-01 ENCOUNTER — Ambulatory Visit (INDEPENDENT_AMBULATORY_CARE_PROVIDER_SITE_OTHER): Payer: Medicare HMO | Admitting: Physician Assistant

## 2016-10-01 ENCOUNTER — Encounter: Payer: Self-pay | Admitting: Physician Assistant

## 2016-10-01 VITALS — BP 110/62 | HR 92 | Temp 98.1°F | Ht 64.0 in | Wt 180.0 lb

## 2016-10-01 DIAGNOSIS — J209 Acute bronchitis, unspecified: Secondary | ICD-10-CM | POA: Diagnosis not present

## 2016-10-01 DIAGNOSIS — R05 Cough: Secondary | ICD-10-CM

## 2016-10-01 DIAGNOSIS — R059 Cough, unspecified: Secondary | ICD-10-CM

## 2016-10-01 MED ORDER — PREDNISONE 10 MG (21) PO TBPK: ORAL_TABLET | ORAL | 0 refills | Status: DC

## 2016-10-01 MED ORDER — BENZONATATE 200 MG PO CAPS: 200.0000 mg | ORAL_CAPSULE | Freq: Two times a day (BID) | ORAL | 0 refills | Status: DC | PRN

## 2016-10-01 MED ORDER — AZITHROMYCIN 250 MG PO TABS: ORAL_TABLET | ORAL | 0 refills | Status: DC

## 2016-10-01 NOTE — Patient Instructions (Signed)

## 2016-10-01 NOTE — Progress Notes (Signed)
BP 110/62   Pulse 92   Temp 98.1 F (36.7 C) (Oral)   Ht 5\' 4"  (1.626 m)   Wt 180 lb (81.6 kg)   SpO2 96%   BMI 30.90 kg/m    Subjective:    Patient ID: Nancy Mccormick, female    DOB: 1934-01-25, 82 y.o.   MRN: [id number]  HPI: Nancy Mccormick is a 82 y.o. female presenting on 10/01/2016 for Cough (pt here today c/o non-productive cough and is taking Amoxicillin TID since Friday)  Patient with several days of progressing aupprt respiratory and bronchial symptoms. Initially there was more upper respiratory congestion. This progressed to having significant cough that is productive throughout the day and severe at night. There is occasional wheezing after coughing. They will sometimes have slight dyspnea on exertion. It is productive mucus that is yellow in color. Denies any blood.   Relevant past medical, surgical, family and social history reviewed and updated as indicated. Allergies and medications reviewed and updated.  Past Medical History:  Diagnosis Date  . Arthritis    back, left shoulder, and fingers  . Atrial fibrillation ([place])   . Hyperlipidemia   . Hypertension   . Osteoporosis     Past Surgical History:  Procedure Laterality Date  . CATARACT EXTRACTION    . KNEE ARTHROSCOPY Right 07/04/2015   Procedure: RIGHT KNEE ARTHROSCOPY WITH MEDIAL MENISCAL DEBRIDEMENT AND CHONDROPLASTY;  Surgeon: Gaynelle Arabian, MD;  Location: [facility];  Service: Orthopedics;  Laterality: Right;  . OPEN REDUCTION INTERNAL FIXATION (ORIF) DISTAL RADIAL FRACTURE Right 12/28/2012   Procedure: OPEN REDUCTION INTERNAL FIXATION (ORIF) DISTAL RADIAL FRACTURE;  Surgeon: Hessie Dibble, MD;  Location: [facility];  Service: Orthopedics;  Laterality: Right;  Marland Kitchen VAGINAL HYSTERECTOMY      Review of Systems  Constitutional: Positive for chills and fatigue. Negative for activity change and appetite change.  HENT: Positive for congestion, postnasal drip and sore throat.   Eyes: Negative.     Respiratory: Positive for cough and wheezing.   Cardiovascular: Negative.  Negative for chest pain, palpitations and leg swelling.  Gastrointestinal: Negative.   Genitourinary: Negative.   Musculoskeletal: Negative.   Skin: Negative.   Neurological: Positive for headaches.    Allergies as of 10/01/2016      Reactions   Atorvastatin Other (See Comments)   myalgias   Crestor [rosuvastatin] Other (See Comments)   Myalgias   Livalo [pitavastatin] Other (See Comments)   myalgias   Shellfish Allergy Itching, Swelling, Rash      Medication List       Accurate as of 10/01/16  2:05 PM. Always use your most recent med list.          aspirin 81 MG tablet Take 81 mg by mouth at bedtime.   azithromycin 250 MG tablet Commonly known as:  ZITHROMAX Z-PAK Take as directed   benzonatate 200 MG capsule Commonly known as:  TESSALON Take 1 capsule (200 mg total) by mouth 2 (two) times daily as needed for cough.   CALCIUM + D PO Take 1 tablet by mouth at bedtime.   Cascara Sagrada 450 MG Caps Take 2 capsules by mouth daily.   cholecalciferol 1000 units tablet Commonly known as:  VITAMIN D Take 2,000 Units by mouth at bedtime.   diltiazem 120 MG 24 hr capsule Commonly known as:  CARDIZEM CD Take 1 capsule by mouth daily.   lisinopril-hydrochlorothiazide 10-12.5 MG tablet Commonly known as:  PRINZIDE,ZESTORETIC Take 1 tablet by mouth daily.  multivitamin with minerals Tabs tablet Take 1 tablet by mouth at bedtime.   NAT-RUL PSYLLIUM SEED HUSKS 500 MG Caps Generic drug:  Psyllium Take 2 capsules by mouth daily.   nitroGLYCERIN 0.4 MG SL tablet Commonly known as:  NITROSTAT Place 1 tablet (0.4 mg total) under the tongue as needed.   omega-3 acid ethyl esters 1 g capsule Commonly known as:  LOVAZA Take 1 capsule (1 g total) by mouth 2 (two) times daily.   potassium chloride 10 MEQ tablet Commonly known as:  K-DUR TAKE 1 TABLET DAILY AT BEDTIME   predniSONE 10 MG  (21) Tbpk tablet Commonly known as:  STERAPRED UNI-PAK 21 TAB As directed x 6 days          Objective:    BP 110/62   Pulse 92   Temp 98.1 F (36.7 C) (Oral)   Ht 5\' 4"  (1.626 m)   Wt 180 lb (81.6 kg)   SpO2 96%   BMI 30.90 kg/m   Allergies  Allergen Reactions  . Atorvastatin Other (See Comments)    myalgias   . Crestor [Rosuvastatin] Other (See Comments)    Myalgias   . Livalo [Pitavastatin] Other (See Comments)    myalgias  . Shellfish Allergy Itching, Swelling and Rash    Physical Exam  Constitutional: She is oriented to person, place, and time. She appears well-developed and well-nourished.  HENT:  Head: Normocephalic and atraumatic.  Right Ear: There is drainage and tenderness.  Left Ear: There is drainage and tenderness.  Nose: Mucosal edema and rhinorrhea present. Right sinus exhibits maxillary sinus tenderness and frontal sinus tenderness. Left sinus exhibits maxillary sinus tenderness and frontal sinus tenderness.  Mouth/Throat: Oropharyngeal exudate and posterior oropharyngeal erythema present.  Eyes: Conjunctivae and EOM are normal. Pupils are equal, round, and reactive to light.  Neck: Normal range of motion. Neck supple.  Cardiovascular: Normal rate, regular rhythm, normal heart sounds and intact distal pulses.   Pulmonary/Chest: Effort normal. She has wheezes in the right upper field and the left upper field.  Abdominal: Soft. Bowel sounds are normal.  Neurological: She is alert and oriented to person, place, and time. She has normal reflexes.  Skin: Skin is warm and dry. No rash noted.  Psychiatric: She has a normal mood and affect. Her behavior is normal. Judgment and thought content normal.        Assessment & Plan:   1. Acute bronchitis, unspecified organism - predniSONE (STERAPRED UNI-PAK 21 TAB) 10 MG (21) TBPK tablet; As directed x 6 days  Dispense: 21 tablet; Refill: 0 - azithromycin (ZITHROMAX Z-PAK) 250 MG tablet; Take as directed   Dispense: 6 each; Refill: 0 - benzonatate (TESSALON) 200 MG capsule; Take 1 capsule (200 mg total) by mouth 2 (two) times daily as needed for cough.  Dispense: 30 capsule; Refill: 0  2. Cough - predniSONE (STERAPRED UNI-PAK 21 TAB) 10 MG (21) TBPK tablet; As directed x 6 days  Dispense: 21 tablet; Refill: 0 - azithromycin (ZITHROMAX Z-PAK) 250 MG tablet; Take as directed  Dispense: 6 each; Refill: 0 - benzonatate (TESSALON) 200 MG capsule; Take 1 capsule (200 mg total) by mouth 2 (two) times daily as needed for cough.  Dispense: 30 capsule; Refill: 0   Continue all other maintenance medications as listed above.  Follow up plan: Return if symptoms worsen or fail to improve.  No orders of the defined types were placed in this encounter.   Educational handout given for bronchitis  Terald Sleeper  PA-C [place]  [place], [place] [address] [phone]   10/01/2016, 2:05 PM

## 2016-10-06 ENCOUNTER — Other Ambulatory Visit: Payer: Self-pay | Admitting: Family

## 2016-10-06 MED ORDER — ALBUTEROL SULFATE HFA 108 (90 BASE) MCG/ACT IN AERS: 2.0000 | INHALATION_SPRAY | Freq: Four times a day (QID) | RESPIRATORY_TRACT | 2 refills | Status: DC | PRN

## 2016-10-06 MED ORDER — DOXYCYCLINE HYCLATE 100 MG PO TABS: 100.0000 mg | ORAL_TABLET | Freq: Two times a day (BID) | ORAL | 0 refills | Status: DC

## 2016-10-06 NOTE — Progress Notes (Signed)
Pt called reporting worsening cough and chest tightness. Albuterol and Doxycycline Prescription sent to pharmacy

## 2016-10-13 ENCOUNTER — Ambulatory Visit (INDEPENDENT_AMBULATORY_CARE_PROVIDER_SITE_OTHER): Payer: Medicare HMO

## 2016-10-13 ENCOUNTER — Ambulatory Visit (INDEPENDENT_AMBULATORY_CARE_PROVIDER_SITE_OTHER): Payer: Medicare HMO | Admitting: Family

## 2016-10-13 ENCOUNTER — Encounter: Payer: Self-pay | Admitting: Family

## 2016-10-13 VITALS — BP 96/61 | HR 90 | Temp 97.2°F | Ht 64.0 in | Wt 178.0 lb

## 2016-10-13 DIAGNOSIS — R059 Cough, unspecified: Secondary | ICD-10-CM

## 2016-10-13 DIAGNOSIS — J209 Acute bronchitis, unspecified: Secondary | ICD-10-CM | POA: Diagnosis not present

## 2016-10-13 DIAGNOSIS — R6889 Other general symptoms and signs: Secondary | ICD-10-CM

## 2016-10-13 DIAGNOSIS — I959 Hypotension, unspecified: Secondary | ICD-10-CM | POA: Diagnosis not present

## 2016-10-13 DIAGNOSIS — R531 Weakness: Secondary | ICD-10-CM

## 2016-10-13 DIAGNOSIS — R05 Cough: Secondary | ICD-10-CM

## 2016-10-13 LAB — VERITOR FLU A/B WAIVED
INFLUENZA A: NEGATIVE
Influenza B: NEGATIVE

## 2016-10-13 MED ORDER — LEVOFLOXACIN 500 MG PO TABS: 500.0000 mg | ORAL_TABLET | Freq: Every day | ORAL | 0 refills | Status: DC

## 2016-10-13 NOTE — Patient Instructions (Signed)

## 2016-10-13 NOTE — Progress Notes (Addendum)
Subjective:    Patient ID: Nancy Mccormick, female    DOB: [birth year], 83 y.o.   MRN: [id number]  Pt presents to the office today with recurrent cough.  Cough  This is a recurrent problem. The current episode started 1 to 4 weeks ago. The problem has been gradually worsening. The problem occurs every few minutes. The cough is non-productive. Associated symptoms include chills, headaches, myalgias, postnasal drip, a sore throat, shortness of breath and wheezing. Pertinent negatives include no ear congestion, ear pain, fever, nasal congestion or rhinorrhea. The symptoms are aggravated by lying down. She has tried rest, oral steroids and OTC cough suppressant (zpak, doxycycline) for the symptoms. The treatment provided moderate relief. There is no history of asthma or COPD.      Review of Systems  Constitutional: Positive for chills. Negative for fever.  HENT: Positive for postnasal drip and sore throat. Negative for ear pain and rhinorrhea.   Respiratory: Positive for cough, shortness of breath and wheezing.   Musculoskeletal: Positive for myalgias.  Neurological: Positive for headaches.  All other systems reviewed and are negative.      Objective:   Physical Exam  Constitutional: She is oriented to person, place, and time. She appears well-developed and well-nourished. No distress.  HENT:  Head: Normocephalic and atraumatic.  Right Ear: External ear normal.  Left Ear: External ear normal.  Nose: Mucosal edema and rhinorrhea present.  Mouth/Throat: Posterior oropharyngeal erythema present.  Eyes: Pupils are equal, round, and reactive to light.  Neck: Normal range of motion. Neck supple. No thyromegaly present.  Cardiovascular: Normal rate, regular rhythm, normal heart sounds and intact distal pulses.   No murmur heard. Pulmonary/Chest: Effort normal. No respiratory distress. She has no wheezes. She has rhonchi.  Intermittent coarse nonproductive cough   Abdominal: Soft. Bowel  sounds are normal. She exhibits no distension. There is no tenderness.  Musculoskeletal: Normal range of motion. She exhibits no edema or tenderness.  Neurological: She is alert and oriented to person, place, and time. She has normal reflexes. No cranial nerve deficit.  Skin: Skin is warm and dry.  Psychiatric: She has a normal mood and affect. Her behavior is normal. Judgment and thought content normal.  Vitals reviewed.   X-ray- Negative Preliminary reading by Evelina Dun, FNP [facility]   BP 96/61   Pulse 90   Temp 97.2 F (36.2 C) (Oral)   Ht 5\' 4"  (1.626 m)   Wt 178 lb (80.7 kg)   SpO2 98%   BMI 30.55 kg/m      Assessment & Plan:  1. Flu-like symptoms - Veritor Flu A/B Waived  2. Cough - Veritor Flu A/B Waived - DG Chest 2 View; Future  3. Weakness - Veritor Flu A/B Waived - DG Chest 2 View; Future  4. Hypotension, unspecified hypotension type - Veritor Flu A/B Waived - DG Chest 2 View; Future  5. Acute bronchitis, unspecified organism - Take meds as prescribed - Use a cool mist humidifier  -Use saline nose sprays frequently -Saline irrigations of the nose can be very helpful if done frequently.  * 4X daily for 1 week*  * Use of a nettie pot can be helpful with this. Follow directions with this* -Force fluids -For any cough or congestion  Use plain Mucinex- regular strength or max strength is fine   * Children- consult with Pharmacist for dosing -For fever or aces or pains- take tylenol or ibuprofen appropriate for age and weight.  * for fevers greater than  101 orally you may alternate ibuprofen and tylenol every  3 hours. -Throat lozenges if help - levofloxacin (LEVAQUIN) 500 MG tablet; Take 1 tablet (500 mg total) by mouth daily.  Dispense: 7 tablet; Refill: 0  Will start pt on levaquin today. If pt's cough or SOB worsens or does not improve pt to go to ED. RTO in 3 days. If symptoms do not improve will send to Pulmonologist to rule out chronic lung  disease?  Evelina Dun, FNP

## 2016-10-16 ENCOUNTER — Encounter: Payer: Self-pay | Admitting: Family

## 2016-10-16 ENCOUNTER — Ambulatory Visit (INDEPENDENT_AMBULATORY_CARE_PROVIDER_SITE_OTHER): Payer: Medicare HMO | Admitting: Family

## 2016-10-16 VITALS — BP 141/91 | HR 67 | Temp 99.2°F | Ht 64.0 in | Wt 181.0 lb

## 2016-10-16 DIAGNOSIS — J189 Pneumonia, unspecified organism: Secondary | ICD-10-CM

## 2016-10-16 MED ORDER — METHYLPREDNISOLONE ACETATE 80 MG/ML IJ SUSP
80.0000 mg | Freq: Once | INTRAMUSCULAR | Status: AC
  Administered 2016-10-16: 80 mg via INTRAMUSCULAR

## 2016-10-16 MED ORDER — CEFTRIAXONE SODIUM 1 G IJ SOLR
1.0000 g | Freq: Once | INTRAMUSCULAR | Status: AC
  Administered 2016-10-16: 1 g via INTRAMUSCULAR

## 2016-10-16 NOTE — Progress Notes (Signed)
   Subjective:    Patient ID: Nancy Mccormick, female    DOB: Feb 06, 1934, 83 y.o.   MRN: [id number]  Pt presents to the office today to recheck CAP. Pt was seen in the clinic on 10/14/15 and was diagnosed with pneumonia. Pt was started on Levaquin,  Pt reports feeling slightly better, but still is very weak. Pt was hypotensive, but has improved.  Cough  The current episode started 1 to 4 weeks ago. The problem has been unchanged. The problem occurs every few minutes. The cough is non-productive. Associated symptoms include a fever, myalgias, shortness of breath and wheezing. Pertinent negatives include no chills, ear congestion or ear pain. The symptoms are aggravated by lying down. She has tried rest for the symptoms. The treatment provided mild relief. Her past medical history is significant for pneumonia.      Review of Systems  Constitutional: Positive for fever. Negative for chills.  HENT: Negative for ear pain.   Respiratory: Positive for cough, shortness of breath and wheezing.   Musculoskeletal: Positive for myalgias.  All other systems reviewed and are negative.      Objective:   Physical Exam  Constitutional: She is oriented to person, place, and time. She appears well-developed and well-nourished. No distress.  HENT:  Head: Normocephalic and atraumatic.  Right Ear: External ear normal.  Left Ear: External ear normal.  Nose: Mucosal edema and rhinorrhea present.  Mouth/Throat: Posterior oropharyngeal erythema present.  Eyes: Pupils are equal, round, and reactive to light.  Neck: Normal range of motion. Neck supple. No thyromegaly present.  Cardiovascular: Normal rate, regular rhythm, normal heart sounds and intact distal pulses.   No murmur heard. Pulmonary/Chest: Tachypnea noted. No respiratory distress. She has decreased breath sounds. She has no wheezes.  Abdominal: Soft. Bowel sounds are normal. She exhibits no distension. There is no tenderness.  Musculoskeletal:  Normal range of motion. She exhibits no edema or tenderness.  Neurological: She is alert and oriented to person, place, and time. She has normal reflexes. No cranial nerve deficit.  Skin: Skin is warm and dry.  Psychiatric: She has a normal mood and affect. Her behavior is normal. Judgment and thought content normal.  Vitals reviewed.     BP (!) 141/91   Pulse 67   Temp 99.2 F (37.3 C) (Oral)   Ht _0  (1.626 m)   Wt 181 lb (82.1 kg)   SpO2 98%   BMI 31.07 kg/m      Assessment & Plan:  1. Community acquired pneumonia, unspecified laterality -Rest -Force fluids -Continue Levaquin -If breathing worsens or does not improve pt to return to office, Pt needs to be seen on 10/22/15 -Use albuterol as needed - CMP14+EGFR - CBC with Differential/Platelet - methylPREDNISolone acetate (DEPO-MEDROL) injection 80 mg; Inject 1 mL (80 mg total) into the muscle once. - cefTRIAXone (ROCEPHIN) injection 1 g; Inject 1 g into the muscle once.  Evelina Dun, FNP

## 2016-10-16 NOTE — Patient Instructions (Signed)
Community-Acquired Pneumonia, Adult °Introduction °Pneumonia is an infection of the lungs. One type of pneumonia can happen while a person is in a hospital. A different type can happen when a person is not in a hospital (community-acquired pneumonia). It is easy for this kind to spread from person to person. It can spread to you if you breathe near an infected person who coughs or sneezes. Some symptoms include: °· A dry cough. °· A wet (productive) cough. °· Fever. °· Sweating. °· Chest pain. °Follow these instructions at home: °· Take over-the-counter and prescription medicines only as told by your doctor. °¨ Only take cough medicine if you are losing sleep. °¨ If you were prescribed an antibiotic medicine, take it as told by your doctor. Do not stop taking the antibiotic even if you start to feel better. °· Sleep with your head and neck raised (elevated). You can do this by putting a few pillows under your head, or you can sleep in a recliner. °· Do not use tobacco products. These include cigarettes, chewing tobacco, and e-cigarettes. If you need help quitting, ask your doctor. °· Drink enough water to keep your pee (urine) clear or pale yellow. °A shot (vaccine) can help prevent pneumonia. Shots are often suggested for: °· People older than 81 years of age. °· People older than 81 years of age: °¨ Who are having cancer treatment. °¨ Who have long-term (chronic) lung disease. °¨ Who have problems with their body's defense system (immune system). °You may also prevent pneumonia if you take these actions: °· Get the flu (influenza) shot every year. °· Go to the dentist as often as told. °· Wash your hands often. If soap and water are not available, use hand sanitizer. °Contact a doctor if: °· You have a fever. °· You lose sleep because your cough medicine does not help. °Get help right away if: °· You are short of breath and it gets worse. °· You have more chest pain. °· Your sickness gets worse. This is very  serious if: °¨ You are an older adult. °¨ Your body's defense system is weak. °· You cough up blood. °This information is not intended to replace advice given to you by your health care provider. Make sure you discuss any questions you have with your health care provider. °Document Released: 03/10/2008 Document Revised: 02/28/2016 Document Reviewed: 01/17/2015 °© 2017 Elsevier ° °

## 2016-10-17 LAB — CMP14+EGFR
A/G RATIO: 1.5 (ref 1.2–2.2)
ALT: 57 IU/L — AB (ref 0–32)
AST: 26 IU/L (ref 0–40)
Albumin: 3.5 g/dL (ref 3.5–4.7)
Alkaline Phosphatase: 112 IU/L (ref 39–117)
BUN/Creatinine Ratio: 16 (ref 12–28)
BUN: 10 mg/dL (ref 8–27)
Bilirubin Total: 0.4 mg/dL (ref 0.0–1.2)
CALCIUM: 9 mg/dL (ref 8.7–10.3)
CO2: 25 mmol/L (ref 18–29)
CREATININE: 0.61 mg/dL (ref 0.57–1.00)
Chloride: 103 mmol/L (ref 96–106)
GFR, EST AFRICAN AMERICAN: 99 mL/min/{1.73_m2} (ref >59)
GFR, EST NON AFRICAN AMERICAN: 86 mL/min/{1.73_m2} (ref >59)
Globulin, Total: 2.4 g/dL (ref 1.5–4.5)
Glucose: 111 mg/dL — ABNORMAL HIGH (ref 65–99)
POTASSIUM: 4.4 mmol/L (ref 3.5–5.2)
Sodium: 144 mmol/L (ref 134–144)
TOTAL PROTEIN: 5.9 g/dL — AB (ref 6.0–8.5)

## 2016-10-17 LAB — CBC WITH DIFFERENTIAL/PLATELET
BASOS: 0 %
Basophils Absolute: 0 10*3/uL (ref 0.0–0.2)
EOS (ABSOLUTE): 0.2 10*3/uL (ref 0.0–0.4)
EOS: 1 %
HEMATOCRIT: 35 % (ref 34.0–46.6)
HEMOGLOBIN: 11.9 g/dL (ref 11.1–15.9)
IMMATURE GRANS (ABS): 0 10*3/uL (ref 0.0–0.1)
IMMATURE GRANULOCYTES: 0 %
LYMPHS: 18 %
Lymphocytes Absolute: 2.2 10*3/uL (ref 0.7–3.1)
MCH: 31.6 pg (ref 26.6–33.0)
MCHC: 34 g/dL (ref 31.5–35.7)
MCV: 93 fL (ref 79–97)
MONOCYTES: 8 %
Monocytes Absolute: 1 10*3/uL — ABNORMAL HIGH (ref 0.1–0.9)
NEUTROS ABS: 8.9 10*3/uL — AB (ref 1.4–7.0)
Neutrophils: 73 %
Platelets: 387 10*3/uL — ABNORMAL HIGH (ref 150–379)
RBC: 3.76 x10E6/uL — ABNORMAL LOW (ref 3.77–5.28)
RDW: 13.8 % (ref 12.3–15.4)
WBC: 12.2 10*3/uL — ABNORMAL HIGH (ref 3.4–10.8)

## 2016-10-21 ENCOUNTER — Encounter: Payer: Self-pay | Admitting: Family

## 2016-10-21 ENCOUNTER — Ambulatory Visit (INDEPENDENT_AMBULATORY_CARE_PROVIDER_SITE_OTHER): Payer: Medicare HMO | Admitting: Family

## 2016-10-21 VITALS — BP 108/75 | HR 77 | Temp 96.7°F | Ht 64.0 in | Wt 178.8 lb

## 2016-10-21 DIAGNOSIS — R0602 Shortness of breath: Secondary | ICD-10-CM

## 2016-10-21 DIAGNOSIS — J189 Pneumonia, unspecified organism: Secondary | ICD-10-CM

## 2016-10-21 MED ORDER — FLUTICASONE FUROATE-VILANTEROL 100-25 MCG/INH IN AEPB: 1.0000 | INHALATION_SPRAY | Freq: Every day | RESPIRATORY_TRACT | 3 refills | Status: DC

## 2016-10-21 NOTE — Patient Instructions (Signed)
Community-Acquired Pneumonia, Adult °Introduction °Pneumonia is an infection of the lungs. One type of pneumonia can happen while a person is in a hospital. A different type can happen when a person is not in a hospital (community-acquired pneumonia). It is easy for this kind to spread from person to person. It can spread to you if you breathe near an infected person who coughs or sneezes. Some symptoms include: °· A dry cough. °· A wet (productive) cough. °· Fever. °· Sweating. °· Chest pain. °Follow these instructions at home: °· Take over-the-counter and prescription medicines only as told by your doctor. °¨ Only take cough medicine if you are losing sleep. °¨ If you were prescribed an antibiotic medicine, take it as told by your doctor. Do not stop taking the antibiotic even if you start to feel better. °· Sleep with your head and neck raised (elevated). You can do this by putting a few pillows under your head, or you can sleep in a recliner. °· Do not use tobacco products. These include cigarettes, chewing tobacco, and e-cigarettes. If you need help quitting, ask your doctor. °· Drink enough water to keep your pee (urine) clear or pale yellow. °A shot (vaccine) can help prevent pneumonia. Shots are often suggested for: °· People older than 81 years of age. °· People older than 81 years of age: °¨ Who are having cancer treatment. °¨ Who have long-term (chronic) lung disease. °¨ Who have problems with their body's defense system (immune system). °You may also prevent pneumonia if you take these actions: °· Get the flu (influenza) shot every year. °· Go to the dentist as often as told. °· Wash your hands often. If soap and water are not available, use hand sanitizer. °Contact a doctor if: °· You have a fever. °· You lose sleep because your cough medicine does not help. °Get help right away if: °· You are short of breath and it gets worse. °· You have more chest pain. °· Your sickness gets worse. This is very  serious if: °¨ You are an older adult. °¨ Your body's defense system is weak. °· You cough up blood. °This information is not intended to replace advice given to you by your health care provider. Make sure you discuss any questions you have with your health care provider. °Document Released: 03/10/2008 Document Revised: 02/28/2016 Document Reviewed: 01/17/2015 °© 2017 Elsevier ° °

## 2016-10-21 NOTE — Progress Notes (Signed)
   Subjective:    Patient ID: Nancy Mccormick, female    DOB: 1934-09-18, 83 y.o.   MRN: [id number]  HPI PT presents to the office today to recheck CAP. PT has been on multiple anabiotics and states she starts feeling better, then after completing them she starts feeling worse.  Headache   Pertinent negatives include no ear pain, fever, rhinorrhea or sore throat.  Shortness of Breath  This is a recurrent problem. The current episode started more than 1 month ago. The problem occurs every few minutes. Associated symptoms include headaches. Pertinent negatives include no ear pain, fever, leg swelling, rhinorrhea, sore throat, sputum production or wheezing. He has tried oral steroids, OTC cough suppressants and rest for the symptoms. The treatment provided moderate relief. His past medical history is significant for pneumonia.     Review of Systems  Neurological: Positive for headaches.  All other systems reviewed and are negative.      Objective:   Physical Exam  Constitutional: She is oriented to person, place, and time. She appears well-developed and well-nourished. No distress.  HENT:  Head: Normocephalic and atraumatic.  Right Ear: External ear normal.  Left Ear: External ear normal.  Nose: Nose normal.  Mouth/Throat: Oropharynx is clear and moist.  Eyes: Pupils are equal, round, and reactive to light.  Neck: Normal range of motion. Neck supple. No thyromegaly present.  Cardiovascular: Normal rate, regular rhythm, normal heart sounds and intact distal pulses.   No murmur heard. Pulmonary/Chest: Effort normal and breath sounds normal. No respiratory distress. She has no wheezes.  Abdominal: Soft. Bowel sounds are normal. She exhibits no distension. There is no tenderness.  Musculoskeletal: Normal range of motion. She exhibits no edema or tenderness.  Neurological: She is alert and oriented to person, place, and time.  Skin: Skin is warm and dry.  Psychiatric: She has a normal  mood and affect. Her behavior is normal. Judgment and thought content normal.  Vitals reviewed.   BP 108/75   Pulse 77   Temp (!) 96.7 F (35.9 C) (Oral)   Ht 5\' 4"  (1.626 m)   Wt 178 lb 12.8 oz (81.1 kg)   BMI 30.69 kg/m        Assessment & Plan:  1. Community acquired pneumonia, unspecified laterality -Improved!! Return to office in 3 weeks to recheck on chest x-ray or if s/s worsen (fever, productive cough, or increase SOB) Rest Force fluids - fluticasone furoate-vilanterol (BREO ELLIPTA) 100-25 MCG/INH AEPB; Inhale 1 puff into the lungs daily.  Dispense: 1 each; Refill: 3  2. SOB (shortness of breath)c  - fluticasone furoate-vilanterol (BREO ELLIPTA) 100-25 MCG/INH AEPB; Inhale 1 puff into the lungs daily.  Dispense: 1 each; Refill: [place], FNP

## 2016-11-04 DIAGNOSIS — H5210 Myopia, unspecified eye: Secondary | ICD-10-CM | POA: Diagnosis not present

## 2016-11-04 DIAGNOSIS — I10 Essential (primary) hypertension: Secondary | ICD-10-CM | POA: Diagnosis not present

## 2016-11-11 ENCOUNTER — Encounter: Payer: Self-pay | Admitting: Family

## 2016-11-11 ENCOUNTER — Ambulatory Visit (INDEPENDENT_AMBULATORY_CARE_PROVIDER_SITE_OTHER): Payer: Medicare HMO | Admitting: Family

## 2016-11-11 ENCOUNTER — Ambulatory Visit (INDEPENDENT_AMBULATORY_CARE_PROVIDER_SITE_OTHER): Payer: Medicare HMO

## 2016-11-11 VITALS — BP 126/73 | HR 59 | Temp 98.1°F | Ht 64.0 in | Wt 183.6 lb

## 2016-11-11 DIAGNOSIS — R5383 Other fatigue: Secondary | ICD-10-CM

## 2016-11-11 DIAGNOSIS — J181 Lobar pneumonia, unspecified organism: Secondary | ICD-10-CM | POA: Diagnosis not present

## 2016-11-11 DIAGNOSIS — J189 Pneumonia, unspecified organism: Secondary | ICD-10-CM | POA: Diagnosis not present

## 2016-11-11 DIAGNOSIS — Z23 Encounter for immunization: Secondary | ICD-10-CM | POA: Diagnosis not present

## 2016-11-11 NOTE — Patient Instructions (Signed)
Community-Acquired Pneumonia, Adult °Introduction °Pneumonia is an infection of the lungs. One type of pneumonia can happen while a person is in a hospital. A different type can happen when a person is not in a hospital (community-acquired pneumonia). It is easy for this kind to spread from person to person. It can spread to you if you breathe near an infected person who coughs or sneezes. Some symptoms include: °· A dry cough. °· A wet (productive) cough. °· Fever. °· Sweating. °· Chest pain. °Follow these instructions at home: °· Take over-the-counter and prescription medicines only as told by your doctor. °¨ Only take cough medicine if you are losing sleep. °¨ If you were prescribed an antibiotic medicine, take it as told by your doctor. Do not stop taking the antibiotic even if you start to feel better. °· Sleep with your head and neck raised (elevated). You can do this by putting a few pillows under your head, or you can sleep in a recliner. °· Do not use tobacco products. These include cigarettes, chewing tobacco, and e-cigarettes. If you need help quitting, ask your doctor. °· Drink enough water to keep your pee (urine) clear or pale yellow. °A shot (vaccine) can help prevent pneumonia. Shots are often suggested for: °· People older than 81 years of age. °· People older than 81 years of age: °¨ Who are having cancer treatment. °¨ Who have long-term (chronic) lung disease. °¨ Who have problems with their body's defense system (immune system). °You may also prevent pneumonia if you take these actions: °· Get the flu (influenza) shot every year. °· Go to the dentist as often as told. °· Wash your hands often. If soap and water are not available, use hand sanitizer. °Contact a doctor if: °· You have a fever. °· You lose sleep because your cough medicine does not help. °Get help right away if: °· You are short of breath and it gets worse. °· You have more chest pain. °· Your sickness gets worse. This is very  serious if: °¨ You are an older adult. °¨ Your body's defense system is weak. °· You cough up blood. °This information is not intended to replace advice given to you by your health care provider. Make sure you discuss any questions you have with your health care provider. °Document Released: 03/10/2008 Document Revised: 02/28/2016 Document Reviewed: 01/17/2015 °© 2017 Elsevier ° °

## 2016-11-11 NOTE — Progress Notes (Signed)
   Subjective:    Patient ID: Nancy Mccormick, female    DOB: 1934/09/08, 83 y.o.   MRN: [id number]  HPI Pt presents to the office to recheck CAP. PT was seen in the office on 10/13/16 and was started on Levaquin. Pt then followed up in 3 days and then was given Rocephin injection and Depo-Medrol 80 mg. Pt reports doing better. She denies any fevers, or cough at this time. States she still has SOB and fatigue at times.     Review of Systems  Respiratory: Positive for shortness of breath.   All other systems reviewed and are negative.      Objective:   Physical Exam  Constitutional: She is oriented to person, place, and time. She appears well-developed and well-nourished. No distress.  HENT:  Head: Normocephalic and atraumatic.  Right Ear: External ear normal.  Left Ear: External ear normal.  Nose: Mucosal edema and rhinorrhea present.  Mouth/Throat: Oropharynx is clear and moist.  Eyes: Pupils are equal, round, and reactive to light.  Neck: Normal range of motion. Neck supple. No thyromegaly present.  Cardiovascular: Normal rate, regular rhythm, normal heart sounds and intact distal pulses.   No murmur heard. Pulmonary/Chest: Effort normal and breath sounds normal. No respiratory distress. She has no wheezes.  Abdominal: Soft. Bowel sounds are normal. She exhibits no distension. There is no tenderness.  Musculoskeletal: Normal range of motion. She exhibits no edema or tenderness.  Neurological: She is alert and oriented to person, place, and time.  Skin: Skin is warm and dry.  Psychiatric: She has a normal mood and affect. Her behavior is normal. Judgment and thought content normal.  Vitals reviewed.   Chest Xray-  Preliminary reading by Evelina Dun, FNP [facility]   BP 126/73   Pulse (!) 59   Temp 98.1 F (36.7 C) (Oral)   Ht 5\' 4"  (1.626 m)   Wt 183 lb 9.6 oz (83.3 kg)   BMI 31.51 kg/m      Assessment & Plan:  1. Pneumonia of left lung due to infectious organism,  unspecified part of lung -Rseolved - DG Chest 2 View; Future  2. Community acquired pneumonia of left lower lobe of lung ([place]) -Resolved  3. Fatigue, unspecified type  Force fluids Continue to rest as needed and discussed it will take time to build back her strength Pneumonia Vaccine given today RTO prn and keep chronic follow up    Evelina Dun, FNP

## 2016-11-11 NOTE — Addendum Note (Signed)
Addended by: Shelbie Ammons on: 11/11/2016 11:25 AM   Modules accepted: Orders

## 2016-12-10 ENCOUNTER — Other Ambulatory Visit: Payer: Self-pay | Admitting: Family Medicine

## 2016-12-15 ENCOUNTER — Telehealth: Payer: Self-pay | Admitting: Family Medicine

## 2016-12-15 NOTE — Telephone Encounter (Signed)
Pt has been taking BP's as instructed. For the last week they have been running 100/60 consistently

## 2016-12-15 NOTE — Telephone Encounter (Signed)
Pt aware of recommendations

## 2016-12-15 NOTE — Telephone Encounter (Signed)
How does she feel? Continue current treatment and have her come by the office in the next week and have a blood pressure done by one of the nurses in the office.

## 2016-12-15 NOTE — Telephone Encounter (Signed)
She has been tired and a little dizzy at times when she is bending over but this has been going on since she had pneumonia which was resolved first of February. Next appt is 4/20

## 2016-12-15 NOTE — Telephone Encounter (Signed)
Zenia Resides have her come by some time this week and have the nurse to check her blood pressure here and make sure there is no heart irregularity that could be contributing to a lower blood pressure and her increased fatigue.

## 2016-12-17 ENCOUNTER — Ambulatory Visit (INDEPENDENT_AMBULATORY_CARE_PROVIDER_SITE_OTHER): Payer: Medicare HMO

## 2016-12-17 VITALS — BP 162/86

## 2016-12-17 DIAGNOSIS — I10 Essential (primary) hypertension: Secondary | ICD-10-CM

## 2016-12-17 NOTE — Progress Notes (Signed)
Patient presents to office today for a nurse visit for a BP check. Reading today was 162/86. Patient states that this is much better because it has been really low. No complaints. Did not bring machine for comparison. Advised patient to continue to monitor. Patient verbalized understanding.

## 2016-12-24 ENCOUNTER — Other Ambulatory Visit: Payer: Self-pay | Admitting: Family

## 2017-01-08 ENCOUNTER — Telehealth: Payer: Self-pay | Admitting: Family Medicine

## 2017-01-08 NOTE — Telephone Encounter (Signed)
Patient advised that 76 is a normal blood sugar.

## 2017-01-23 ENCOUNTER — Ambulatory Visit: Payer: Medicare HMO | Admitting: Family Medicine

## 2017-02-20 ENCOUNTER — Ambulatory Visit (INDEPENDENT_AMBULATORY_CARE_PROVIDER_SITE_OTHER): Payer: Medicare HMO | Admitting: Family Medicine

## 2017-02-20 ENCOUNTER — Other Ambulatory Visit: Payer: Medicare HMO

## 2017-02-20 DIAGNOSIS — E7800 Pure hypercholesterolemia, unspecified: Secondary | ICD-10-CM | POA: Diagnosis not present

## 2017-02-20 DIAGNOSIS — I10 Essential (primary) hypertension: Secondary | ICD-10-CM | POA: Diagnosis not present

## 2017-02-20 DIAGNOSIS — E559 Vitamin D deficiency, unspecified: Secondary | ICD-10-CM | POA: Diagnosis not present

## 2017-02-21 LAB — CBC WITH DIFFERENTIAL/PLATELET
BASOS: 0 %
Basophils Absolute: 0 10*3/uL (ref 0.0–0.2)
EOS (ABSOLUTE): 0.2 10*3/uL (ref 0.0–0.4)
Eos: 3 %
Hematocrit: 39.7 % (ref 34.0–46.6)
Hemoglobin: 14 g/dL (ref 11.1–15.9)
IMMATURE GRANULOCYTES: 0 %
Immature Grans (Abs): 0 10*3/uL (ref 0.0–0.1)
Lymphocytes Absolute: 2.3 10*3/uL (ref 0.7–3.1)
Lymphs: 29 %
MCH: 33.3 pg — AB (ref 26.6–33.0)
MCHC: 35.3 g/dL (ref 31.5–35.7)
MCV: 94 fL (ref 79–97)
MONOS ABS: 0.6 10*3/uL (ref 0.1–0.9)
Monocytes: 8 %
NEUTROS PCT: 60 %
Neutrophils Absolute: 4.7 10*3/uL (ref 1.4–7.0)
PLATELETS: 400 10*3/uL — AB (ref 150–379)
RBC: 4.21 x10E6/uL (ref 3.77–5.28)
RDW: 14 % (ref 12.3–15.4)
WBC: 7.9 10*3/uL (ref 3.4–10.8)

## 2017-02-21 LAB — BMP8+EGFR
BUN / CREAT RATIO: 18 (ref 12–28)
BUN: 16 mg/dL (ref 8–27)
CALCIUM: 9.8 mg/dL (ref 8.7–10.3)
CHLORIDE: 103 mmol/L (ref 96–106)
CO2: 26 mmol/L (ref 18–29)
Creatinine, Ser: 0.9 mg/dL (ref 0.57–1.00)
GFR calc Af Amer: 70 mL/min/{1.73_m2} (ref >59)
GFR calc non Af Amer: 61 mL/min/{1.73_m2} (ref >59)
Glucose: 121 mg/dL — ABNORMAL HIGH (ref 65–99)
POTASSIUM: 5 mmol/L (ref 3.5–5.2)
SODIUM: 146 mmol/L — AB (ref 134–144)

## 2017-02-21 LAB — LIPID PANEL
CHOLESTEROL TOTAL: 198 mg/dL (ref 100–199)
Chol/HDL Ratio: 3 ratio (ref 0.0–4.4)
HDL: 65 mg/dL (ref >39)
LDL Calculated: 107 mg/dL — ABNORMAL HIGH (ref 0–99)
Triglycerides: 132 mg/dL (ref 0–149)
VLDL CHOLESTEROL CAL: 26 mg/dL (ref 5–40)

## 2017-02-21 LAB — HEPATIC FUNCTION PANEL
ALT: 16 IU/L (ref 0–32)
AST: 17 IU/L (ref 0–40)
Albumin: 4.2 g/dL (ref 3.5–4.7)
Alkaline Phosphatase: 73 IU/L (ref 39–117)
Bilirubin Total: 0.2 mg/dL (ref 0.0–1.2)
Bilirubin, Direct: 0.1 mg/dL (ref 0.00–0.40)
Total Protein: 6.4 g/dL (ref 6.0–8.5)

## 2017-02-21 LAB — VITAMIN D 25 HYDROXY (VIT D DEFICIENCY, FRACTURES): VIT D 25 HYDROXY: 35.6 ng/mL (ref 30.0–100.0)

## 2017-02-23 ENCOUNTER — Encounter: Payer: Self-pay | Admitting: Family Medicine

## 2017-02-23 ENCOUNTER — Ambulatory Visit (INDEPENDENT_AMBULATORY_CARE_PROVIDER_SITE_OTHER): Payer: Medicare HMO | Admitting: Family Medicine

## 2017-02-23 VITALS — BP 111/71 | HR 43 | Temp 98.3°F | Ht 64.0 in | Wt 185.0 lb

## 2017-02-23 DIAGNOSIS — E559 Vitamin D deficiency, unspecified: Secondary | ICD-10-CM

## 2017-02-23 DIAGNOSIS — M79604 Pain in right leg: Secondary | ICD-10-CM

## 2017-02-23 DIAGNOSIS — I10 Essential (primary) hypertension: Secondary | ICD-10-CM | POA: Diagnosis not present

## 2017-02-23 DIAGNOSIS — E7800 Pure hypercholesterolemia, unspecified: Secondary | ICD-10-CM | POA: Diagnosis not present

## 2017-02-23 DIAGNOSIS — I499 Cardiac arrhythmia, unspecified: Secondary | ICD-10-CM | POA: Diagnosis not present

## 2017-02-23 DIAGNOSIS — H6121 Impacted cerumen, right ear: Secondary | ICD-10-CM | POA: Diagnosis not present

## 2017-02-23 NOTE — Patient Instructions (Addendum)
Medicare Annual Wellness Visit  [facility] and the medical providers at [facility] strive to bring you the best medical care.  In doing so we not only want to address your current medical conditions and concerns but also to detect new conditions early and prevent illness, disease and health-related problems.    Medicare offers a yearly Wellness Visit which allows our clinical staff to assess your need for preventative services including immunizations, lifestyle education, counseling to decrease risk of preventable diseases and screening for fall risk and other medical concerns.    This visit is provided free of charge (no copay) for all Medicare recipients. The clinical pharmacists at [facility] have begun to conduct these Wellness Visits which will also include a thorough review of all your medications.    As you primary medical provider recommend that you make an appointment for your Annual Wellness Visit if you have not done so already this year.  You may set up this appointment before you leave today or you may call back ([phone]) and schedule an appointment.  Please make sure when you call that you mention that you are scheduling your Annual Wellness Visit with the clinical pharmacist so that the appointment may be made for the proper length of time.     Continue current medications. Continue good therapeutic lifestyle changes which include good diet and exercise. Fall precautions discussed with patient. If an FOBT was given today- please return it to our front desk. If you are over 52 years old - you may need Prevnar 73 or the adult Pneumonia vaccine.  **Flu shots are available--- please call and schedule a FLU-CLINIC appointment**  After your visit with [place] today you will receive a survey in the mail or online from [employer] regarding your care with [place]. Please take a moment to fill this out. Your feedback is very  important to [place] as you can help [place] better understand your patient needs as well as improve your experience and satisfaction. WE CARE ABOUT YOU!!!   If the problems with the right lower leg continue we should get LS spine films and possibly a visit to the orthopedic surgeon.

## 2017-02-23 NOTE — Progress Notes (Signed)
Subjective:    Patient ID: Nancy Mccormick, female    DOB: 12/28/1933, 83 y.o.   MRN: [id number]  HPI Pt here for follow up and management of chronic medical problems which includes hyperlipidemia and hypertension.Today the patient complains of some pain in the right lower leg. She is not requesting any refills. She is due to get a mammogram return an FOBT and her lab work is been returned and we will review that with her during the visit today. A pulse rate on her initial check-in was 43 we will certainly need to follow-up on that. Cholesterol numbers with traditional lipid testing were much improved from previously and had a total cholesterol that is now 198 and was previously 263. Triglycerides are improved at 132 and the good cholesterol remains excellent at 65. LDL C cholesterol is elevated at 107 but previously was 144. CBC has a normal white blood cell count. The hemoglobin was good at 14 and this is improved from previously. Likely caps remains slightly elevated. The blood sugar is elevated at 121 and the creatinine, the most important kidney function test was good at 0.90. The electrolytes including potassium are good except the serum sodium was elevated at 146. All liver function tests were good. The vitamin D level was good at 35.6. It is important to note that the patient has been intolerant of statin drugs and can only take omega-3 fatty acids. The patient denies any chest pain or shortness of breath anymore than usual. She is swallowing her food without problems. She denies heartburn indigestion nausea vomiting diarrhea or blood in the stool. She did have a colonoscopy in May 2013 teen. The initial pulse rate on the patient was 43 but this was repeated and I got 60 on the repeat.    Patient Active Problem List   Diagnosis Date Noted  . Acute medial meniscal tear 07/03/2015  . Osteopenia 04/19/2014  . Palpitations 11/04/2013  . Arrhythmia 11/04/2013  . FIBRILLATION, ATRIAL 09/11/2010    . HYPERLIPIDEMIA 09/10/2010  . HYPERTENSION 09/10/2010   Outpatient Encounter Prescriptions as of 02/23/2017  Medication Sig  . albuterol (PROVENTIL HFA;VENTOLIN HFA) 108 (90 Base) MCG/ACT inhaler Inhale 2 puffs into the lungs every 6 (six) hours as needed for wheezing or shortness of breath.  Marland Kitchen aspirin 81 MG tablet Take 81 mg by mouth at bedtime.   . Calcium Carbonate-Vitamin D (CALCIUM + D PO) Take 1 tablet by mouth at bedtime.  Rolena Infante [place] 450 MG CAPS Take 2 capsules by mouth daily.  . Cholecalciferol (VITAMIN D3) 1000 UNITS tablet Take 2,000 Units by mouth at bedtime.   Marland Kitchen diltiazem (CARDIZEM CD) 120 MG 24 hr capsule TAKE ONE (1) CAPSULE EACH DAY  . fluticasone furoate-vilanterol (BREO ELLIPTA) 100-25 MCG/INH AEPB Inhale 1 puff into the lungs daily.  Marland Kitchen lisinopril-hydrochlorothiazide (PRINZIDE,ZESTORETIC) 10-12.5 MG tablet Take 1 tablet by mouth daily.  . Multiple Vitamin (MULTIVITAMIN WITH MINERALS) TABS tablet Take 1 tablet by mouth at bedtime.  Marland Kitchen omega-3 acid ethyl esters (LOVAZA) 1 G capsule Take 1 capsule (1 g total) by mouth 2 (two) times daily.  . potassium chloride (K-DUR) 10 MEQ tablet TAKE 1 TABLET DAILY AT BEDTIME  . Psyllium (NAT-RUL PSYLLIUM SEED HUSKS) 500 MG CAPS Take 2 capsules by mouth daily.  . nitroGLYCERIN (NITROSTAT) 0.4 MG SL tablet Place 1 tablet (0.4 mg total) under the tongue as needed. (Patient not taking: Reported on 02/23/2017)  . [DISCONTINUED] diltiazem (CARDIZEM CD) 120 MG 24 hr capsule Take 1  capsule by mouth daily.   No facility-administered encounter medications on file as of 02/23/2017.       Review of Systems  Constitutional: Negative.   HENT: Negative.   Eyes: Negative.   Respiratory: Negative.   Cardiovascular: Negative.   Gastrointestinal: Negative.   Endocrine: Negative.   Genitourinary: Negative.   Musculoskeletal: Negative.        Pain in lower right leg   Skin: Negative.   Allergic/Immunologic: Negative.   Neurological: Negative.    Hematological: Negative.   Psychiatric/Behavioral: Negative.        Objective:   Physical Exam  Constitutional: She is oriented to person, place, and time. She appears well-developed and well-nourished. No distress.  The patient is pleasant and alert  HENT:  Head: Normocephalic and atraumatic.  Right Ear: External ear normal.  Left Ear: External ear normal.  Nose: Nose normal.  Mouth/Throat: Oropharynx is clear and moist.  Eyes: Conjunctivae and EOM are normal. Pupils are equal, round, and reactive to light. Right eye exhibits no discharge. Left eye exhibits no discharge. No scleral icterus.  Neck: Normal range of motion. Neck supple. No thyromegaly present.  No bruits thyromegaly or anterior cervical adenopathy  Cardiovascular: Normal rate, normal heart sounds and intact distal pulses.   No murmur heard. The heart is irregular at this 72/m  Pulmonary/Chest: Effort normal and breath sounds normal. No respiratory distress. She has no wheezes. She has no rales.  Clear anteriorly and posteriorly  Abdominal: Soft. Bowel sounds are normal. She exhibits no mass. There is no tenderness. There is no rebound and no guarding.  No abdominal tenderness masses or bruits. No inguinal adenopathy.  Musculoskeletal: Normal range of motion. She exhibits no edema.  Slight bilateral joint line tenderness of the right knee. Good leg raising with abduction and abduction without pain.  Lymphadenopathy:    She has no cervical adenopathy.  Neurological: She is alert and oriented to person, place, and time. She has normal reflexes. No cranial nerve deficit.  Skin: Skin is warm and dry. No rash noted.  Psychiatric: She has a normal mood and affect. Her behavior is normal. Judgment and thought content normal.  Nursing note and vitals reviewed.   BP 111/71 (BP Location: Left Arm)   Pulse (!) 43   Temp 98.3 F (36.8 C) (Oral)   Ht 5\' 4"  (1.626 m)   Wt 185 lb (83.9 kg)   BMI 31.76 kg/m   EKG with  results pending A previous EKG back in 2017 did have some frequent premature atrial contractions.     Assessment & Plan:  1. Essential hypertension, benign -The blood pressure is good today and she will continue with current treatment  2. Pure hypercholesterolemia -Cholesterol numbers were much improved although the LDL C was still slightly elevated and she will continue with aggressive therapeutic lifestyle changes and her omega-3 fatty acids. He is statin intolerant.  3. Vitamin D deficiency -She will increase her vitamin D3 to 2000 units daily.  4. Irregular heartbeat -On reviewing her record we did find where she had had some previous EKGs with hematuria atrial contractions. We will repeat the EKG today just to confirm that this is what is going on.  5. Right leg pain -This appears to be positional in nature. It does not occur frequently. It occurs mostly in the bed and sometimes during the day. If this progresses and becomes more frequent she will come back and we will get LS spine films to further evaluate this.  6. Right ear impacted cerumen -Irrigation of the right ear canal.  . Patient Instructions                       Medicare Annual Wellness Visit  [facility] and the medical providers at [facility] strive to bring you the best medical care.  In doing so we not only want to address your current medical conditions and concerns but also to detect new conditions early and prevent illness, disease and health-related problems.    Medicare offers a yearly Wellness Visit which allows our clinical staff to assess your need for preventative services including immunizations, lifestyle education, counseling to decrease risk of preventable diseases and screening for fall risk and other medical concerns.    This visit is provided free of charge (no copay) for all Medicare recipients. The clinical pharmacists at [facility] have begun to  conduct these Wellness Visits which will also include a thorough review of all your medications.    As you primary medical provider recommend that you make an appointment for your Annual Wellness Visit if you have not done so already this year.  You may set up this appointment before you leave today or you may call back ([phone]) and schedule an appointment.  Please make sure when you call that you mention that you are scheduling your Annual Wellness Visit with the clinical pharmacist so that the appointment may be made for the proper length of time.     Continue current medications. Continue good therapeutic lifestyle changes which include good diet and exercise. Fall precautions discussed with patient. If an FOBT was given today- please return it to our front desk. If you are over 27 years old - you may need Prevnar 18 or the adult Pneumonia vaccine.  **Flu shots are available--- please call and schedule a FLU-CLINIC appointment**  After your visit with [place] today you will receive a survey in the mail or online from [employer] regarding your care with [place]. Please take a moment to fill this out. Your feedback is very important to [place] as you can help [place] better understand your patient needs as well as improve your experience and satisfaction. WE CARE ABOUT YOU!!!   If the problems with the right lower leg continue we should get LS spine films and possibly a visit to the orthopedic surgeon.  Arrie Senate MD

## 2017-02-25 LAB — HGB A1C W/O EAG: Hgb A1c MFr Bld: 5.8 % — ABNORMAL HIGH (ref 4.8–5.6)

## 2017-02-25 LAB — SPECIMEN STATUS REPORT

## 2017-04-10 DIAGNOSIS — Z9181 History of falling: Secondary | ICD-10-CM | POA: Diagnosis not present

## 2017-04-10 DIAGNOSIS — Z0000 Encounter for general adult medical examination without abnormal findings: Secondary | ICD-10-CM | POA: Diagnosis not present

## 2017-04-10 DIAGNOSIS — Z98811 Dental restoration status: Secondary | ICD-10-CM | POA: Diagnosis not present

## 2017-04-10 DIAGNOSIS — I471 Supraventricular tachycardia: Secondary | ICD-10-CM | POA: Diagnosis not present

## 2017-04-10 DIAGNOSIS — M152 Bouchard's nodes (with arthropathy): Secondary | ICD-10-CM | POA: Diagnosis not present

## 2017-04-10 DIAGNOSIS — Z7982 Long term (current) use of aspirin: Secondary | ICD-10-CM | POA: Diagnosis not present

## 2017-04-10 DIAGNOSIS — E669 Obesity, unspecified: Secondary | ICD-10-CM | POA: Diagnosis not present

## 2017-04-10 DIAGNOSIS — M151 Heberden's nodes (with arthropathy): Secondary | ICD-10-CM | POA: Diagnosis not present

## 2017-04-10 DIAGNOSIS — K5900 Constipation, unspecified: Secondary | ICD-10-CM | POA: Diagnosis not present

## 2017-04-10 DIAGNOSIS — E785 Hyperlipidemia, unspecified: Secondary | ICD-10-CM | POA: Diagnosis not present

## 2017-04-10 DIAGNOSIS — R002 Palpitations: Secondary | ICD-10-CM | POA: Diagnosis not present

## 2017-04-10 DIAGNOSIS — Z79899 Other long term (current) drug therapy: Secondary | ICD-10-CM | POA: Diagnosis not present

## 2017-04-10 DIAGNOSIS — I10 Essential (primary) hypertension: Secondary | ICD-10-CM | POA: Diagnosis not present

## 2017-04-10 DIAGNOSIS — I4891 Unspecified atrial fibrillation: Secondary | ICD-10-CM | POA: Diagnosis not present

## 2017-05-28 ENCOUNTER — Other Ambulatory Visit: Payer: Self-pay | Admitting: Family Medicine

## 2017-07-01 ENCOUNTER — Other Ambulatory Visit: Payer: Self-pay | Admitting: Family Medicine

## 2017-07-01 ENCOUNTER — Other Ambulatory Visit: Payer: Self-pay | Admitting: Family

## 2017-07-10 ENCOUNTER — Ambulatory Visit (INDEPENDENT_AMBULATORY_CARE_PROVIDER_SITE_OTHER): Payer: Medicare HMO | Admitting: Family Medicine

## 2017-07-10 ENCOUNTER — Encounter: Payer: Self-pay | Admitting: Family Medicine

## 2017-07-10 VITALS — BP 133/80 | HR 73 | Temp 97.5°F | Ht 64.0 in | Wt 190.0 lb

## 2017-07-10 DIAGNOSIS — H6123 Impacted cerumen, bilateral: Secondary | ICD-10-CM | POA: Diagnosis not present

## 2017-07-10 DIAGNOSIS — E559 Vitamin D deficiency, unspecified: Secondary | ICD-10-CM | POA: Diagnosis not present

## 2017-07-10 DIAGNOSIS — R5383 Other fatigue: Secondary | ICD-10-CM

## 2017-07-10 DIAGNOSIS — I10 Essential (primary) hypertension: Secondary | ICD-10-CM

## 2017-07-10 DIAGNOSIS — E7800 Pure hypercholesterolemia, unspecified: Secondary | ICD-10-CM

## 2017-07-10 NOTE — Progress Notes (Signed)
Subjective:    Patient ID: Nancy Mccormick, female    DOB: 1934/06/23, 83 y.o.   MRN: [id number]  HPI Pt here for follow up and management of chronic medical problems which includes hyperlipidemia and hypertension. She is taking medication regularly. The patient is doing well overall but complains of fatigue because of being a caregiver for her husband who has Parkinson's. The patient has gained 5 pounds of weight. She is due to return an FOBT and get lab work today. She has a history of atrial fibrillation. Her vital signs are stable otherwise. She is taking medicines for her heart rhythm cholesterol blood pressure vitamin D and allergies. The patient looks fatigued this morning. She is pleasant and alert. She is a loving wife who is taking care of her husband who is become more in need of care and frequently through the night because of having to get up to go void. His behavior has changed some recently and he may be a little bit more agitated and demanding than usual. She is wearing out. She has seen an elder care attorney with her signs and trying to do the most that she can do to provide the care that her husband needs to have an keeping him at home. They do plan to get someone to come help him during the day and they may have to get someone to come stay at nighttime if she does not get more rest. Anyway she has identified the problem and is working on this and hopefully when the daytime help comes and we'll give her time to get rest during the day. She denies any chest pain or shortness of breath. She denies any trouble with her intestinal tract other than occasional loose bowel movements. She understands to watch caffeine intake and milk cheese ice cream and dairy products. She denies any trouble with passing her water. She has had no blood in the stool or black tarry bowel movements.     Patient Active Problem List   Diagnosis Date Noted  . Acute medial meniscal tear 07/03/2015  . Osteopenia  04/19/2014  . Palpitations 11/04/2013  . Arrhythmia 11/04/2013  . FIBRILLATION, ATRIAL 09/11/2010  . HYPERLIPIDEMIA 09/10/2010  . HYPERTENSION 09/10/2010   Outpatient Encounter Prescriptions as of 07/10/2017  Medication Sig  . albuterol (PROVENTIL HFA;VENTOLIN HFA) 108 (90 Base) MCG/ACT inhaler Inhale 2 puffs into the lungs every 6 (six) hours as needed for wheezing or shortness of breath.  Marland Kitchen aspirin 81 MG tablet Take 81 mg by mouth at bedtime.   . Calcium Carbonate-Vitamin D (CALCIUM + D PO) Take 1 tablet by mouth at bedtime.  Rolena Infante Sagrada 450 MG CAPS Take 2 capsules by mouth daily.  . Cholecalciferol (VITAMIN D3) 1000 UNITS tablet Take 2,000 Units by mouth at bedtime.   Marland Kitchen diltiazem (CARDIZEM CD) 120 MG 24 hr capsule TAKE ONE (1) CAPSULE EACH DAY  . fluticasone furoate-vilanterol (BREO ELLIPTA) 100-25 MCG/INH AEPB Inhale 1 puff into the lungs daily.  Marland Kitchen lisinopril-hydrochlorothiazide (PRINZIDE,ZESTORETIC) 10-12.5 MG tablet TAKE 1 TABLET DAILY AT BEDTIME  . Multiple Vitamin (MULTIVITAMIN WITH MINERALS) TABS tablet Take 1 tablet by mouth at bedtime.  . nitroGLYCERIN (NITROSTAT) 0.4 MG SL tablet Place 1 tablet (0.4 mg total) under the tongue as needed.  Marland Kitchen omega-3 acid ethyl esters (LOVAZA) 1 g capsule TAKE ONE CAPSULE BY MOUTH TWICE A DAY  . potassium chloride (K-DUR) 10 MEQ tablet TAKE 1 TABLET DAILY AT BEDTIME  . Psyllium (NAT-RUL PSYLLIUM SEED HUSKS) 500  MG CAPS Take 2 capsules by mouth daily.   No facility-administered encounter medications on file as of 07/10/2017.      Review of Systems  Constitutional: Positive for fatigue (caring for husband).  HENT: Negative.   Eyes: Negative.   Respiratory: Negative.   Cardiovascular: Negative.   Gastrointestinal: Negative.   Endocrine: Negative.   Genitourinary: Negative.   Musculoskeletal: Negative.   Skin: Negative.   Allergic/Immunologic: Negative.   Neurological: Negative.   Hematological: Negative.   Psychiatric/Behavioral:  Negative.        Objective:   Physical Exam  Constitutional: She is oriented to person, place, and time. She appears well-developed and well-nourished. No distress.  Pleasant and alert but fatigued and tired appearing  HENT:  Head: Normocephalic and atraumatic.  Nose: Nose normal.  Mouth/Throat: Oropharynx is clear and moist. No oropharyngeal exudate.  Bilateral ear cerumen  Eyes: Pupils are equal, round, and reactive to light. Conjunctivae and EOM are normal. Right eye exhibits no discharge. Left eye exhibits no discharge. No scleral icterus.  Neck: Normal range of motion. Neck supple. No thyromegaly present.  No bruits thyromegaly or anterior cervical adenopathy  Cardiovascular: Normal rate, regular rhythm, normal heart sounds and intact distal pulses.   No murmur heard. The heart is 60/m with a regular rate and rhythm  Pulmonary/Chest: Effort normal and breath sounds normal. No respiratory distress. She has no wheezes. She has no rales.  Clear anteriorly and posteriorly  Abdominal: Soft. Bowel sounds are normal. She exhibits no mass. There is no tenderness. There is no rebound and no guarding.  Nontender without masses or bruits organ enlargement or inguinal adenopathy  Musculoskeletal: Normal range of motion. She exhibits no edema.  Lymphadenopathy:    She has no cervical adenopathy.  Neurological: She is alert and oriented to person, place, and time. She has normal reflexes. No cranial nerve deficit.  Skin: Skin is warm and dry. Rash noted.  Psychiatric: She has a normal mood and affect. Her behavior is normal. Judgment and thought content normal.  Nursing note and vitals reviewed.  BP 133/80 (BP Location: Left Arm)   Pulse 73   Temp (!) 97.5 F (36.4 C) (Oral)   Ht _0  (1.626 m)   Wt 190 lb (86.2 kg)   BMI 32.61 kg/m         Assessment & Plan:  1. Essential hypertension, benign -The blood pressure is good today and she will continue to work with her diet and stay  on the same medication - BMP8+EGFR - CBC with Differential/Platelet - Hepatic function panel  2. Pure hypercholesterolemia -Continue with aggressive therapeutic lifestyle changes omega-3 fatty acids. - CBC with Differential/Platelet - Lipid panel  3. Vitamin D deficiency -Continue with current treatment pending results of lab work - CBC with Differential/Platelet - VITAMIN D 25 Hydroxy (Vit-D Deficiency, Fractures)  4. Other fatigue - Thyroid Panel With TSH - Vitamin B12  5. Caregiver with fatigue -Check labs as doing and to rule out any other issues going on with fatigue  6. Excessive cerumen in ear canal, bilateral -Bilateral ear irrigation  Patient Instructions                       Medicare Annual Wellness Visit  [facility] and the medical providers at [facility] strive to bring you the best medical care.  In doing so we not only want to address your current medical conditions and concerns but also to detect  new conditions early and prevent illness, disease and health-related problems.    Medicare offers a yearly Wellness Visit which allows our clinical staff to assess your need for preventative services including immunizations, lifestyle education, counseling to decrease risk of preventable diseases and screening for fall risk and other medical concerns.    This visit is provided free of charge (no copay) for all Medicare recipients. The clinical pharmacists at [facility] have begun to conduct these Wellness Visits which will also include a thorough review of all your medications.    As you primary medical provider recommend that you make an appointment for your Annual Wellness Visit if you have not done so already this year.  You may set up this appointment before you leave today or you may call back ([phone]) and schedule an appointment.  Please make sure when you call that you mention that you are scheduling your Annual  Wellness Visit with the clinical pharmacist so that the appointment may be made for the proper length of time.     Continue current medications. Continue good therapeutic lifestyle changes which include good diet and exercise. Fall precautions discussed with patient. If an FOBT was given today- please return it to our front desk. If you are over 59 years old - you may need Prevnar 34 or the adult Pneumonia vaccine.  **Flu shots are available--- please call and schedule a FLU-CLINIC appointment**  After your visit with [place] today you will receive a survey in the mail or online from [employer] regarding your care with [place]. Please take a moment to fill this out. Your feedback is very important to [place] as you can help [place] better understand your patient needs as well as improve your experience and satisfaction. WE CARE ABOUT YOU!!!   Drink plenty of fluids and stay well hydrated Try to find times to relax and get out and take better care of yourself We will pray for you and your family that you will have the endurance to get through the trials that you are being faced with   Arrie Senate MD

## 2017-07-10 NOTE — Patient Instructions (Addendum)
Medicare Annual Wellness Visit  [facility] and the medical providers at [facility] strive to bring you the best medical care.  In doing so we not only want to address your current medical conditions and concerns but also to detect new conditions early and prevent illness, disease and health-related problems.    Medicare offers a yearly Wellness Visit which allows our clinical staff to assess your need for preventative services including immunizations, lifestyle education, counseling to decrease risk of preventable diseases and screening for fall risk and other medical concerns.    This visit is provided free of charge (no copay) for all Medicare recipients. The clinical pharmacists at [facility] have begun to conduct these Wellness Visits which will also include a thorough review of all your medications.    As you primary medical provider recommend that you make an appointment for your Annual Wellness Visit if you have not done so already this year.  You may set up this appointment before you leave today or you may call back ([phone]) and schedule an appointment.  Please make sure when you call that you mention that you are scheduling your Annual Wellness Visit with the clinical pharmacist so that the appointment may be made for the proper length of time.     Continue current medications. Continue good therapeutic lifestyle changes which include good diet and exercise. Fall precautions discussed with patient. If an FOBT was given today- please return it to our front desk. If you are over 42 years old - you may need Prevnar 50 or the adult Pneumonia vaccine.  **Flu shots are available--- please call and schedule a FLU-CLINIC appointment**  After your visit with [place] today you will receive a survey in the mail or online from [employer] regarding your care with [place]. Please take a moment to fill this out. Your feedback is very  important to [place] as you can help [place] better understand your patient needs as well as improve your experience and satisfaction. WE CARE ABOUT YOU!!!   Drink plenty of fluids and stay well hydrated Try to find times to relax and get out and take better care of yourself We will pray for you and your family that you will have the endurance to get through the trials that you are being faced with

## 2017-07-11 LAB — CBC WITH DIFFERENTIAL/PLATELET
BASOS: 0 %
Basophils Absolute: 0 10*3/uL (ref 0.0–0.2)
EOS (ABSOLUTE): 0.3 10*3/uL (ref 0.0–0.4)
EOS: 3 %
HEMATOCRIT: 37.1 % (ref 34.0–46.6)
Hemoglobin: 13.2 g/dL (ref 11.1–15.9)
IMMATURE GRANS (ABS): 0 10*3/uL (ref 0.0–0.1)
IMMATURE GRANULOCYTES: 0 %
LYMPHS: 23 %
Lymphocytes Absolute: 2.1 10*3/uL (ref 0.7–3.1)
MCH: 32.9 pg (ref 26.6–33.0)
MCHC: 35.6 g/dL (ref 31.5–35.7)
MCV: 93 fL (ref 79–97)
MONOS ABS: 0.9 10*3/uL (ref 0.1–0.9)
Monocytes: 10 %
NEUTROS ABS: 5.7 10*3/uL (ref 1.4–7.0)
NEUTROS PCT: 64 %
Platelets: 329 10*3/uL (ref 150–379)
RBC: 4.01 x10E6/uL (ref 3.77–5.28)
RDW: 14.5 % (ref 12.3–15.4)
WBC: 9 10*3/uL (ref 3.4–10.8)

## 2017-07-11 LAB — BMP8+EGFR
BUN/Creatinine Ratio: 23 (ref 12–28)
BUN: 16 mg/dL (ref 8–27)
CO2: 23 mmol/L (ref 20–29)
Calcium: 9 mg/dL (ref 8.7–10.3)
Chloride: 104 mmol/L (ref 96–106)
Creatinine, Ser: 0.69 mg/dL (ref 0.57–1.00)
GFR calc Af Amer: 95 mL/min/{1.73_m2} (ref >59)
GFR, EST NON AFRICAN AMERICAN: 82 mL/min/{1.73_m2} (ref >59)
Glucose: 102 mg/dL — ABNORMAL HIGH (ref 65–99)
POTASSIUM: 4 mmol/L (ref 3.5–5.2)
SODIUM: 143 mmol/L (ref 134–144)

## 2017-07-11 LAB — HEPATIC FUNCTION PANEL
ALT: 27 IU/L (ref 0–32)
AST: 23 IU/L (ref 0–40)
Albumin: 4 g/dL (ref 3.5–4.7)
Alkaline Phosphatase: 88 IU/L (ref 39–117)
Bilirubin Total: 0.6 mg/dL (ref 0.0–1.2)
Bilirubin, Direct: 0.13 mg/dL (ref 0.00–0.40)
Total Protein: 6.3 g/dL (ref 6.0–8.5)

## 2017-07-11 LAB — THYROID PANEL WITH TSH
FREE THYROXINE INDEX: 1.3 (ref 1.2–4.9)
T3 Uptake Ratio: 22 % — ABNORMAL LOW (ref 24–39)
T4, Total: 6.1 ug/dL (ref 4.5–12.0)
TSH: 4.43 u[IU]/mL (ref 0.450–4.500)

## 2017-07-11 LAB — LIPID PANEL
CHOL/HDL RATIO: 3.5 ratio (ref 0.0–4.4)
Cholesterol, Total: 228 mg/dL — ABNORMAL HIGH (ref 100–199)
HDL: 65 mg/dL (ref >39)
LDL CALC: 127 mg/dL — AB (ref 0–99)
Triglycerides: 181 mg/dL — ABNORMAL HIGH (ref 0–149)
VLDL CHOLESTEROL CAL: 36 mg/dL (ref 5–40)

## 2017-07-11 LAB — VITAMIN D 25 HYDROXY (VIT D DEFICIENCY, FRACTURES): Vit D, 25-Hydroxy: 23.5 ng/mL — ABNORMAL LOW (ref 30.0–100.0)

## 2017-07-11 LAB — VITAMIN B12: VITAMIN B 12: 313 pg/mL (ref 232–1245)

## 2017-08-04 ENCOUNTER — Encounter: Payer: Self-pay | Admitting: *Deleted

## 2017-08-20 ENCOUNTER — Ambulatory Visit: Payer: Medicare HMO | Admitting: *Deleted

## 2017-08-25 ENCOUNTER — Encounter: Payer: Self-pay | Admitting: *Deleted

## 2017-08-25 ENCOUNTER — Ambulatory Visit (INDEPENDENT_AMBULATORY_CARE_PROVIDER_SITE_OTHER): Payer: Medicare HMO | Admitting: *Deleted

## 2017-08-25 VITALS — BP 135/80 | HR 65 | Ht 63.5 in | Wt 189.0 lb

## 2017-08-25 DIAGNOSIS — Z0000 Encounter for general adult medical examination without abnormal findings: Secondary | ICD-10-CM

## 2017-08-25 NOTE — Patient Instructions (Signed)
  Nancy Mccormick , Thank you for taking time to come for your Medicare Wellness Visit. I appreciate your ongoing commitment to your health goals. Please review the following plan we discussed and let me know if I can assist you in the future.   These are the goals we discussed: Goals    . Exercise 150 min/wk Moderate Activity       This is a list of the screening recommended for you and due dates:  Health Maintenance  Topic Date Due  . Tetanus Vaccine  09/05/2016  . Flu Shot  08/13/2018*  . Pap Smear  02/24/2020*  . DEXA scan (bone density measurement)  Completed  . Pneumonia vaccines  Completed  *Topic was postponed. The date shown is not the original due date.

## 2017-08-25 NOTE — Progress Notes (Signed)
Subjective:   Nancy Mccormick is a 83 y.o. female who presents for an Initial Medicare Annual Wellness Visit. 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 She was still working part time up until September when her husbands health declined. He has been wheelchair bound for several years but was able to mostly care for himself. After being hospitalized for sepsis in September he has required round the clock care. Nancy Mccormick is his primary caregiver. She doesn't sleep well at night because she gets up to help him many times. They have 3 adult sons that help out when they can. 2 of them live out of town and 1 of them lives locally but he works all over the state. She has hired a CNA to come in twice a week for 4 hours at a time. This has helped some and allows her time to take care of errands or nap.   Review of Systems    Overall she feels that her health is about the same as last year. She is just extremely exhausted due to caring for her husband.   She is concerned about weight gain. Has had a 7lb increase over the past year.    Cardiac Risk Factors include: advanced age (>57men, >78 women);hypertension;dyslipidemia  Other systems negative today.     Objective:    Today's Vitals   08/25/17 0919  BP: 135/80  Pulse: 65  Weight: 189 lb (85.7 kg)  Height: 5' 3.5" (1.613 m)   Body mass index is 32.95 kg/m.   Current Medications (verified) Outpatient Encounter Medications as of 08/25/2017  Medication Sig  . aspirin 81 MG tablet Take 81 mg by mouth at bedtime.   . Calcium Carbonate-Vitamin D (CALCIUM + D PO) Take 1 tablet by mouth at bedtime.  Rolena Infante Sagrada 450 MG CAPS Take 2 capsules by mouth daily.  . Cholecalciferol (VITAMIN D3) 1000 UNITS tablet Take 2,000 Units by mouth at bedtime.   Marland Kitchen diltiazem (CARDIZEM CD) 120 MG 24 hr capsule TAKE ONE (1) CAPSULE EACH DAY  . lisinopril-hydrochlorothiazide (PRINZIDE,ZESTORETIC) 10-12.5 MG tablet TAKE 1 TABLET DAILY AT BEDTIME  . Multiple  Vitamin (MULTIVITAMIN WITH MINERALS) TABS tablet Take 1 tablet by mouth at bedtime.  . nitroGLYCERIN (NITROSTAT) 0.4 MG SL tablet Place 1 tablet (0.4 mg total) under the tongue as needed.  Marland Kitchen omega-3 acid ethyl esters (LOVAZA) 1 g capsule TAKE ONE CAPSULE BY MOUTH TWICE A DAY  . potassium chloride (K-DUR) 10 MEQ tablet TAKE 1 TABLET DAILY AT BEDTIME  . Psyllium (NAT-RUL PSYLLIUM SEED HUSKS) 500 MG CAPS Take 2 capsules by mouth daily.  Marland Kitchen albuterol (PROVENTIL HFA;VENTOLIN HFA) 108 (90 Base) MCG/ACT inhaler Inhale 2 puffs into the lungs every 6 (six) hours as needed for wheezing or shortness of breath. (Patient not taking: Reported on 08/25/2017)  . fluticasone furoate-vilanterol (BREO ELLIPTA) 100-25 MCG/INH AEPB Inhale 1 puff into the lungs daily. (Patient not taking: Reported on 08/25/2017)   No facility-administered encounter medications on file as of 08/25/2017.     Allergies (verified) Atorvastatin; Crestor [rosuvastatin]; Livalo [pitavastatin]; and Shellfish allergy   History: Past Medical History:  Diagnosis Date  . Arthritis    back, left shoulder, and fingers  . Atrial fibrillation ([place])   . Hyperlipidemia   . Hypertension   . Osteoporosis    Past Surgical History:  Procedure Laterality Date  . CATARACT EXTRACTION    . OPEN REDUCTION INTERNAL FIXATION (ORIF) DISTAL RADIAL FRACTURE Right 12/28/2012   Performed by Melrose Nakayama  G, MD at [facility]  . RIGHT KNEE ARTHROSCOPY WITH MEDIAL MENISCAL DEBRIDEMENT AND CHONDROPLASTY Right 07/04/2015   Performed by Gaynelle Arabian, MD at [facility]  . VAGINAL HYSTERECTOMY     Family History  Problem Relation Age of Onset  . Stroke Mother 32  . Anemia Mother   . Heart disease Mother   . Stroke Father 39  . Stroke Sister 96  . Hypertension Son    Social History   Occupational History  . 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 Smoking status: Former Smoker    Types:  Cigarettes    Last attempt to quit: 12/17/1986    Years since quitting: 30.7  . Smokeless tobacco: Never Used  . Tobacco comment: Smoke briefly in the distant past  Substance and Sexual Activity  . Alcohol use: No  . Drug use: No  . Sexual activity: Not Currently    Tobacco Counseling No current tobacco use  Activities of Daily Living In your present state of health, do you have any difficulty performing the following activities: 08/25/2017  Hearing? N  Vision? N  Comment sees eye doctor yearly  Difficulty concentrating or making decisions? N  Comment Some problems due to exhaustion from caring for husband  Walking or climbing stairs? Y  Comment right leg pain  Dressing or bathing? N  Doing errands, shopping? N  Preparing Food and eating ? N  Using the Toilet? N  In the past six months, have you accidently leaked urine? Y  Comment urinary frequency  Do you have problems with loss of bowel control? N  Managing your Medications? N  Managing your Finances? N  Housekeeping or managing your Housekeeping? N  Some recent data might be hidden    Immunizations and Health Maintenance Immunization History  Administered Date(s) Administered  . Pneumococcal Conjugate-13 12/08/2013  . Pneumococcal Polysaccharide-23 11/11/2016   Health Maintenance Due  Topic Date Due  . TETANUS/TDAP  09/05/2016    Patient Care Team: Chipper Herb, MD as PCP - General (Family Medicine)  No surgeries, hospitalizations, or ER visits this past year.      Assessment:   This is a routine wellness examination for Nancy Mccormick.   Hearing/Vision screen No deficits noted during visit. Eye exam is up to date.  Dietary issues and exercise activities discussed: Current Exercise Habits: The patient does not participate in regular exercise at present(Physically active caring for her husband who is in a wheelchair. Has to help him move around. )   Diet Breakfast-Cereal Lunch-Soup,  sandwich Supper-Traditional [employer]. Meet, vegetable, bread. Typically fries meets. [place] a lot of chicken.   Goals    . Exercise 150 min/wk Moderate Activity      Depression Screen PHQ 2/9 Scores 08/25/2017 07/10/2017 02/23/2017 11/11/2016 10/21/2016 10/16/2016 10/13/2016  PHQ - 2 Score 1 1 0 0 0 0 0  PHQ- 9 Score - - - - - 0 -    Fall Risk Fall Risk  08/25/2017 07/10/2017 02/23/2017 11/11/2016 10/21/2016  Falls in the past year? No No No Yes Yes  Number falls in past yr: - - - 1 1  Injury with Fall? - - - Yes Yes  Comment - - - - -    Cognitive Function: MMSE - Mini Mental State Exam 08/25/2017 02/20/2015  Not completed: - Refused  Orientation to time 5 4  Orientation to Place 5 5  Registration 3 3  Attention/ Calculation 5 5  Recall 3 3  Language- name 2 objects 2 2  Language- repeat 1 1  Language- follow 3 step command 3 3  Language- read & follow direction 1 1  Write a sentence 1 1  Copy design 1 1  Total score 30 29      Screening Tests  Health Maintenance  Topic Date Due  . TETANUS/TDAP  08/25/2018 (Originally 09/05/2016)  . DEXA SCAN  Completed  . PNA vac Low Risk Adult  Completed  . INFLUENZA VACCINE  Discontinued        Plan:  Try to work in exercise daily even if you have to break it down in to smaller sessions.  Ask for help when you need it to avoid caregiver strain. Watch portion sizes.  Stay connected to friends and family.  Move carefully to avoid falls and be careful not to injure yourself helping your husband move.  Keep appt with PCP  I have personally reviewed and noted the following in the patient's chart:   . Medical and social history . Use of alcohol, tobacco or illicit drugs  . Current medications and supplements . Functional ability and status . Nutritional status . Physical activity . Advanced directives . List of other physicians . Hospitalizations, surgeries, and ER visits in previous 12 months . Vitals . Screenings to include  cognitive, depression, and falls . Referrals and appointments  In addition, I have reviewed and discussed with patient certain preventive protocols, quality metrics, and best practice recommendations. A written personalized care plan for preventive services as well as general preventive health recommendations were provided to patient.     Chong Sicilian, RN   08/25/2017   I have reviewed and agree with the above AWV documentation.   Arrie Senate MD

## 2017-08-28 ENCOUNTER — Other Ambulatory Visit: Payer: Self-pay | Admitting: Family Medicine

## 2017-08-30 ENCOUNTER — Emergency Department (HOSPITAL_COMMUNITY): Payer: Medicare HMO

## 2017-08-30 ENCOUNTER — Inpatient Hospital Stay (HOSPITAL_COMMUNITY)
Admission: EM | Admit: 2017-08-30 | Discharge: 2017-09-01 | DRG: 065 | Disposition: A | Discharge status: Home Health | Payer: Medicare HMO | Attending: Family Medicine | Admitting: Family Medicine

## 2017-08-30 ENCOUNTER — Other Ambulatory Visit: Payer: Self-pay

## 2017-08-30 ENCOUNTER — Encounter (HOSPITAL_COMMUNITY): Payer: Self-pay | Admitting: *Deleted

## 2017-08-30 DIAGNOSIS — Z8249 Family history of ischemic heart disease and other diseases of the circulatory system: Secondary | ICD-10-CM | POA: Diagnosis not present

## 2017-08-30 DIAGNOSIS — I63412 Cerebral infarction due to embolism of left middle cerebral artery: Secondary | ICD-10-CM | POA: Diagnosis not present

## 2017-08-30 DIAGNOSIS — R29704 NIHSS score 4: Secondary | ICD-10-CM | POA: Diagnosis present

## 2017-08-30 DIAGNOSIS — Z888 Allergy status to other drugs, medicaments and biological substances status: Secondary | ICD-10-CM

## 2017-08-30 DIAGNOSIS — E785 Hyperlipidemia, unspecified: Secondary | ICD-10-CM | POA: Diagnosis not present

## 2017-08-30 DIAGNOSIS — I10 Essential (primary) hypertension: Secondary | ICD-10-CM | POA: Diagnosis not present

## 2017-08-30 DIAGNOSIS — Z823 Family history of stroke: Secondary | ICD-10-CM

## 2017-08-30 DIAGNOSIS — I4891 Unspecified atrial fibrillation: Secondary | ICD-10-CM | POA: Diagnosis present

## 2017-08-30 DIAGNOSIS — Z79899 Other long term (current) drug therapy: Secondary | ICD-10-CM

## 2017-08-30 DIAGNOSIS — R404 Transient alteration of awareness: Secondary | ICD-10-CM | POA: Diagnosis not present

## 2017-08-30 DIAGNOSIS — I499 Cardiac arrhythmia, unspecified: Secondary | ICD-10-CM | POA: Diagnosis present

## 2017-08-30 DIAGNOSIS — R531 Weakness: Secondary | ICD-10-CM | POA: Diagnosis not present

## 2017-08-30 DIAGNOSIS — Z789 Other specified health status: Secondary | ICD-10-CM | POA: Diagnosis present

## 2017-08-30 DIAGNOSIS — M810 Age-related osteoporosis without current pathological fracture: Secondary | ICD-10-CM | POA: Diagnosis present

## 2017-08-30 DIAGNOSIS — Z91013 Allergy to seafood: Secondary | ICD-10-CM

## 2017-08-30 DIAGNOSIS — Z90710 Acquired absence of both cervix and uterus: Secondary | ICD-10-CM

## 2017-08-30 DIAGNOSIS — Z87891 Personal history of nicotine dependence: Secondary | ICD-10-CM | POA: Diagnosis not present

## 2017-08-30 DIAGNOSIS — I480 Paroxysmal atrial fibrillation: Secondary | ICD-10-CM | POA: Diagnosis not present

## 2017-08-30 DIAGNOSIS — I63233 Cerebral infarction due to unspecified occlusion or stenosis of bilateral carotid arteries: Secondary | ICD-10-CM | POA: Diagnosis not present

## 2017-08-30 DIAGNOSIS — I6340 Cerebral infarction due to embolism of unspecified cerebral artery: Secondary | ICD-10-CM | POA: Diagnosis not present

## 2017-08-30 DIAGNOSIS — R4701 Aphasia: Secondary | ICD-10-CM | POA: Diagnosis present

## 2017-08-30 DIAGNOSIS — I6789 Other cerebrovascular disease: Secondary | ICD-10-CM | POA: Diagnosis not present

## 2017-08-30 DIAGNOSIS — Z8673 Personal history of transient ischemic attack (TIA), and cerebral infarction without residual deficits: Secondary | ICD-10-CM | POA: Diagnosis present

## 2017-08-30 DIAGNOSIS — I161 Hypertensive emergency: Secondary | ICD-10-CM | POA: Diagnosis not present

## 2017-08-30 DIAGNOSIS — I639 Cerebral infarction, unspecified: Secondary | ICD-10-CM

## 2017-08-30 DIAGNOSIS — R4182 Altered mental status, unspecified: Secondary | ICD-10-CM | POA: Diagnosis not present

## 2017-08-30 DIAGNOSIS — Z9849 Cataract extraction status, unspecified eye: Secondary | ICD-10-CM | POA: Diagnosis not present

## 2017-08-30 DIAGNOSIS — Z7982 Long term (current) use of aspirin: Secondary | ICD-10-CM | POA: Diagnosis not present

## 2017-08-30 DIAGNOSIS — R29818 Other symptoms and signs involving the nervous system: Secondary | ICD-10-CM | POA: Diagnosis not present

## 2017-08-30 DIAGNOSIS — R4781 Slurred speech: Secondary | ICD-10-CM | POA: Diagnosis not present

## 2017-08-30 LAB — URINALYSIS, ROUTINE W REFLEX MICROSCOPIC
BACTERIA UA: NONE SEEN
BILIRUBIN URINE: NEGATIVE
GLUCOSE, UA: NEGATIVE mg/dL
HGB URINE DIPSTICK: NEGATIVE
KETONES UR: 5 mg/dL — AB
NITRITE: NEGATIVE
PROTEIN: NEGATIVE mg/dL
Specific Gravity, Urine: 1.014 (ref 1.005–1.030)
pH: 7 (ref 5.0–8.0)

## 2017-08-30 LAB — RAPID URINE DRUG SCREEN, HOSP PERFORMED
Amphetamines: NOT DETECTED
Barbiturates: NOT DETECTED
Benzodiazepines: NOT DETECTED
Cocaine: NOT DETECTED
OPIATES: NOT DETECTED
Tetrahydrocannabinol: NOT DETECTED

## 2017-08-30 LAB — I-STAT CHEM 8, ED
BUN: 24 mg/dL — ABNORMAL HIGH (ref 6–20)
CALCIUM ION: 1.08 mmol/L — AB (ref 1.15–1.40)
Chloride: 106 mmol/L (ref 101–111)
Creatinine, Ser: 1 mg/dL (ref 0.44–1.00)
GLUCOSE: 96 mg/dL (ref 65–99)
HCT: 40 % (ref 36.0–46.0)
Hemoglobin: 13.6 g/dL (ref 12.0–15.0)
Potassium: 4 mmol/L (ref 3.5–5.1)
SODIUM: 140 mmol/L (ref 135–145)
TCO2: 27 mmol/L (ref 22–32)

## 2017-08-30 LAB — COMPREHENSIVE METABOLIC PANEL
ALK PHOS: 84 U/L (ref 38–126)
ALT: 27 U/L (ref 14–54)
ANION GAP: 11 (ref 5–15)
AST: 24 U/L (ref 15–41)
Albumin: 3.9 g/dL (ref 3.5–5.0)
BUN: 22 mg/dL — ABNORMAL HIGH (ref 6–20)
CALCIUM: 9.3 mg/dL (ref 8.9–10.3)
CO2: 25 mmol/L (ref 22–32)
CREATININE: 0.92 mg/dL (ref 0.44–1.00)
Chloride: 106 mmol/L (ref 101–111)
GFR, EST NON AFRICAN AMERICAN: 57 mL/min — AB (ref >60)
Glucose, Bld: 99 mg/dL (ref 65–99)
Potassium: 3.8 mmol/L (ref 3.5–5.1)
SODIUM: 142 mmol/L (ref 135–145)
TOTAL PROTEIN: 7 g/dL (ref 6.5–8.1)
Total Bilirubin: 1.1 mg/dL (ref 0.3–1.2)

## 2017-08-30 LAB — DIFFERENTIAL
Basophils Absolute: 0 10*3/uL (ref 0.0–0.1)
Basophils Relative: 0 %
EOS PCT: 2 %
Eosinophils Absolute: 0.3 10*3/uL (ref 0.0–0.7)
LYMPHS PCT: 28 %
Lymphs Abs: 3.6 10*3/uL (ref 0.7–4.0)
MONO ABS: 1.2 10*3/uL — AB (ref 0.1–1.0)
MONOS PCT: 9 %
NEUTROS ABS: 7.6 10*3/uL (ref 1.7–7.7)
Neutrophils Relative %: 61 %

## 2017-08-30 LAB — CBC
HEMATOCRIT: 44.9 % (ref 36.0–46.0)
Hemoglobin: 14.5 g/dL (ref 12.0–15.0)
MCH: 30.5 pg (ref 26.0–34.0)
MCHC: 32.3 g/dL (ref 30.0–36.0)
MCV: 94.3 fL (ref 78.0–100.0)
PLATELETS: 343 10*3/uL (ref 150–400)
RBC: 4.76 MIL/uL (ref 3.87–5.11)
RDW: 13.8 % (ref 11.5–15.5)
WBC: 12.6 10*3/uL — AB (ref 4.0–10.5)

## 2017-08-30 LAB — PROTIME-INR
INR: 0.95
PROTHROMBIN TIME: 12.6 s (ref 11.4–15.2)

## 2017-08-30 LAB — I-STAT TROPONIN, ED: Troponin i, poc: 0 ng/mL (ref 0.00–0.08)

## 2017-08-30 LAB — ETHANOL

## 2017-08-30 LAB — APTT: aPTT: 28 seconds (ref 24–36)

## 2017-08-30 MED ORDER — ACETAMINOPHEN 650 MG RE SUPP: 650.0000 mg | RECTAL | Status: DC | PRN

## 2017-08-30 MED ORDER — METOCLOPRAMIDE HCL 5 MG/ML IJ SOLN
5.0000 mg | Freq: Once | INTRAMUSCULAR | Status: AC
  Administered 2017-08-30: 5 mg via INTRAVENOUS
  Filled 2017-08-30: qty 2

## 2017-08-30 MED ORDER — HYDROCODONE-ACETAMINOPHEN 5-325 MG PO TABS
1.0000 | ORAL_TABLET | Freq: Four times a day (QID) | ORAL | Status: DC | PRN
  Administered 2017-08-30: 1 via ORAL
  Filled 2017-08-30: qty 1

## 2017-08-30 MED ORDER — IOPAMIDOL (ISOVUE-370) INJECTION 76%
100.0000 mL | Freq: Once | INTRAVENOUS | Status: AC | PRN
  Administered 2017-08-30: 100 mL via INTRAVENOUS

## 2017-08-30 MED ORDER — STROKE: EARLY STAGES OF RECOVERY BOOK
Freq: Once | Status: AC
  Administered 2017-08-31: 16:00:00
  Filled 2017-08-30: qty 1

## 2017-08-30 MED ORDER — DILTIAZEM HCL ER COATED BEADS 120 MG PO CP24
120.0000 mg | ORAL_CAPSULE | Freq: Every day | ORAL | Status: DC
  Administered 2017-08-31 – 2017-09-01 (×2): 120 mg via ORAL
  Filled 2017-08-30 (×2): qty 1

## 2017-08-30 MED ORDER — ASPIRIN EC 81 MG PO TBEC
81.0000 mg | DELAYED_RELEASE_TABLET | Freq: Every day | ORAL | Status: DC
  Administered 2017-08-31: 81 mg via ORAL
  Filled 2017-08-30 (×2): qty 1

## 2017-08-30 MED ORDER — ACETAMINOPHEN 160 MG/5ML PO SOLN: 650.0000 mg | ORAL | Status: DC | PRN

## 2017-08-30 MED ORDER — ACETAMINOPHEN 325 MG PO TABS: 650.0000 mg | ORAL_TABLET | ORAL | Status: DC | PRN

## 2017-08-30 MED ORDER — ALBUTEROL SULFATE (2.5 MG/3ML) 0.083% IN NEBU: 3.0000 mL | INHALATION_SOLUTION | Freq: Four times a day (QID) | RESPIRATORY_TRACT | Status: DC | PRN

## 2017-08-30 MED ORDER — ASPIRIN 325 MG PO TABS
325.0000 mg | ORAL_TABLET | Freq: Once | ORAL | Status: AC
  Administered 2017-08-30: 325 mg via ORAL
  Filled 2017-08-30: qty 1

## 2017-08-30 NOTE — ED Triage Notes (Signed)
Pt brought in by [facility] for c/o slurred speech and headache all day; pt's son reported to ems that pt's speech was not norm for her;

## 2017-08-30 NOTE — ED Notes (Signed)
Pt assisted onto bedpan.

## 2017-08-30 NOTE — ED Notes (Signed)
Report to Tisha, RN 

## 2017-08-30 NOTE — Consult Note (Addendum)
   [facility] TeleNeurology Consult Services  Impression:  Patient with worsening expressive aphasia which could represent LMCA focal occlusion. Will get CTA/CTP to eval for LVO.   Not a tpa candidate due to: out of window    Differential Diagnosis:   1. Cardioembolic stroke  2. Small vessel disease/lacune  3. Thromboembolic, artery-to-artery mechanism  4. Hypercoagulable state-related infarct  5. Transient ischemic attack  6. Thrombotic mechanism, large artery disease  7. Seizure 8. HTN emergency  Comments:   Door time: 1910 [facility] contacted: 1923 [facility] at bedside: 1927 NIHSS assessment time: 1930  Recommendations:  CTA/CTP Discussed with ED MD  -----------------------------------------------------------------------------------------  CC: stroke alert  History of Present Illness   Patient is an 83 year old woman with a history of HTN, HLD, possible afib (had abnormal EKG in past with question of this) who presented to the ED with speech changes. She was reportedly last seen normal at 1500.  She has been having expressive language trouble thereafter which has been getting worse.  No other indication of weakness, sensory change, vision change, gait change, vertigo.  She had reported a HA earlier.   Diagnostic: CT head wo - nothing acute, possible hyperdense left M2 branch in Sylvian fissure  Exam:  NIHSS score: 4 1a LOC: 0  1b Questions: 2 1c Commands: 0  2 Gaze: 0  3 VF: 0  4 Face: 0  5a Motor arm left: 0  5b Motor arm right: 0  6a Motor leg left: 0  6b Motor leg right: 0 7 Ataxia: 0  8 Sensory: 0  9: Language: 2 10: Speech: 0  11: Extinction: 0       Medical Decision Making:  - Extensive number of diagnosis or management options are considered above.   - Extensive amount of complex data reviewed.   - High risk of complication and/or morbidity or mortality are associated with differential diagnostic considerations above.  -  There may be Uncertain outcome and increased probability of prolonged functional impairment or high probability of severe prolonged functional impairment associated with some of these differential diagnosis.  Medical Data Reviewed:  1.Data reviewed include clinical labs, radiology,  Medical Tests;   2.Tests results discussed w/performing or interpreting physician;   3.Obtaining/reviewing old medical records;  4.Obtaining case history from another source;  5.Independent review of image, tracing or specimen.    Patient was informed the Neurology Consult would happen via telehealth (remote video) and consented to receiving care in this manner.    **ADDENDUM** - No LVO seen on CTA, therefore no role for intervention.  Patient will be admitted for further stroke work up.  ASA in ED now.

## 2017-08-30 NOTE — ED Notes (Signed)
Pt assisted to bedpan.  ?

## 2017-08-30 NOTE — ED Notes (Signed)
Pt to CT

## 2017-08-30 NOTE — Progress Notes (Signed)
Beeper at [facility], phone rang [phone], scan at 1917 exam finished and sent to soc at [facility], exam complted in epic and  radiology called [place] penn code stroke  ER D. Jacubotiwitz  [phone]

## 2017-08-30 NOTE — H&P (Signed)
History and Physical:    Jenell Dobransky   RSW:[id number] DOB: 1934/09/30 DOA: 08/30/2017    PCP: Chipper Herb, MD   Patient coming from: home  Chief Complaint: trouble finding words  History of Present Illness:   Rucha Wissinger is an 83 y.o. female with no previous CVA or CAD who reports "I had afib once" who takes ASA daily, who had a HA today (all over) without NV or neuro changes. Apparently, sometime after waking up this afternoon (maybe around 15:30) noted by family to have trouble finding words. Was able to follow physical commands and could hear well.  Brought to ER.  In ER, stroke evaluation done.  Currently pending swallow (so that she can take an ASA, which she usually takes at night).  But otherwise, just complains of HA all over. She notes her trouble with word finding, but son  (who saw her earlier in the day) notes that her speech is a little better.  Denies: Fevers, chills, nausea, emesis, rash, chest pain, shortness of breath, pain with respiration, headache, abdominal pain, skin lesions or rashes, GU discharge, bowel or bladder dysfunction, dysuria, hallucinations, visual changes, taste changes, musculoskeletal swelling or deformity, bleeding problems, or allergy symptoms.     ROS:  All other ROS are negative except as noted in the HPI above.  Past Medical History:   Past Medical History:  Diagnosis Date  . Arthritis    back, left shoulder, and fingers  . Atrial fibrillation ([place])   . Hyperlipidemia   . Hypertension   . Osteoporosis     Past Surgical History:   Past Surgical History:  Procedure Laterality Date  . CATARACT EXTRACTION    . KNEE ARTHROSCOPY Right 07/04/2015   Procedure: RIGHT KNEE ARTHROSCOPY WITH MEDIAL MENISCAL DEBRIDEMENT AND CHONDROPLASTY;  Surgeon: Gaynelle Arabian, MD;  Location: [facility];  Service: Orthopedics;  Laterality: Right;  . OPEN REDUCTION INTERNAL FIXATION (ORIF) DISTAL RADIAL FRACTURE Right 12/28/2012   Procedure: OPEN  REDUCTION INTERNAL FIXATION (ORIF) DISTAL RADIAL FRACTURE;  Surgeon: Hessie Dibble, MD;  Location: [facility];  Service: Orthopedics;  Laterality: Right;  Marland Kitchen VAGINAL HYSTERECTOMY      Social History:   Social History   Socioeconomic History  . Marital status: Married    Spouse name: Not on file  . Number of children: 3  . Years of education: Not on file  . Highest education level: Not on file  Social Needs  . Financial resource strain: Not very hard  . Food insecurity - worry: Never true  . Food insecurity - inability: Never true  . Transportation needs - medical: No  . Transportation needs - non-medical: No  Occupational History  . 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 Smoking status: Former Smoker    Types: Cigarettes    Last attempt to quit: 12/17/1986    Years since quitting: 30.7  . Smokeless tobacco: Never Used  . Tobacco comment: Smoke briefly in the distant past  Substance and Sexual Activity  . Alcohol use: No  . Drug use: No  . Sexual activity: Not Currently  Other Topics Concern  . 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     Allergies   Atorvastatin; Crestor [rosuvastatin]; Livalo [pitavastatin]; and Shellfish allergy  Family history:   Family History  Problem Relation Age of Onset  .  Stroke Mother 27  . Anemia Mother   . Heart disease Mother   . Stroke Father 1  . Stroke Sister 65  . Hypertension Son     Current Medications:   Prior to Admission medications   Medication Sig Start Date End Date Taking? Authorizing Provider  acetaminophen (TYLENOL) 500 MG tablet Take 1,000 mg by mouth every 6 (six) hours as needed for mild pain or moderate pain.   Yes [provider]  aspirin 81 MG tablet Take 81 mg by mouth at bedtime.    Yes [provider]  albuterol (PROVENTIL HFA;VENTOLIN HFA) 108 (90 Base) MCG/ACT  inhaler Inhale 2 puffs into the lungs every 6 (six) hours as needed for wheezing or shortness of breath. Patient not taking: Reported on 08/25/2017 10/06/16   Evelina Dun A, FNP  Calcium Carbonate-Vitamin D (CALCIUM + D PO) Take 1 tablet by mouth at bedtime.    [provider]  Cascara [place] 450 MG CAPS Take 2 capsules by mouth daily.    [provider]  Cholecalciferol (VITAMIN D3) 1000 UNITS tablet Take 2,000 Units by mouth at bedtime.     [provider]  diltiazem (CARDIZEM CD) 120 MG 24 hr capsule TAKE ONE (1) CAPSULE EACH DAY 07/01/17   Chipper Herb, MD  fluticasone furoate-vilanterol (BREO ELLIPTA) 100-25 MCG/INH AEPB Inhale 1 puff into the lungs daily. Patient not taking: Reported on 08/25/2017 10/21/16   Sharion Balloon, FNP  lisinopril-hydrochlorothiazide (PRINZIDE,ZESTORETIC) 10-12.5 MG tablet TAKE 1 TABLET DAILY AT BEDTIME 05/28/17   Chipper Herb, MD  Multiple Vitamin (MULTIVITAMIN WITH MINERALS) TABS tablet Take 1 tablet by mouth at bedtime.    [provider]  nitroGLYCERIN (NITROSTAT) 0.4 MG SL tablet Place 1 tablet (0.4 mg total) under the tongue as needed. 06/20/14   Chipper Herb, MD  omega-3 acid ethyl esters (LOVAZA) 1 g capsule TAKE ONE CAPSULE BY MOUTH TWICE A DAY 07/01/17   Chipper Herb, MD  potassium chloride (K-DUR) 10 MEQ tablet TAKE ONE TABLET DAILY AT BEDTIME 08/28/17   Chipper Herb, MD  Psyllium (NAT-RUL PSYLLIUM SEED HUSKS) 500 MG CAPS Take 2 capsules by mouth daily.    [provider]    Physical Exam:   Vitals:   08/30/17 2030 08/30/17 2031 08/30/17 2045 08/30/17 2100  BP: (!) 161/140 (!) 161/140 (!) 121/108 (!) 164/108  Pulse: (!) 48 61 (!) 38 70  Resp: 19 15 (!) 38 (!) 21  Temp:      TempSrc:      SpO2: 99% 96% 99% 98%     Physical Exam: Blood pressure (!) 164/108, pulse 70, temperature 98.3 F (36.8 C), resp. rate (!) 21, SpO2 98 %. Gen: NAD, WDWN Head: Normocephalic, atraumatic. Eyes:  PERRL. EOMI.  Sclerae nonicteric. No lid lag. No foreign body. Mouth: OP without lesions/masses/bleeding, patent posterior OP. Neck: Supple, no thyromegaly, no cervical or submandibular lymphadenopathy or adenitis. Pulm: Lungs are clear to auscultation with good air movement. No rales, rhonchi or wheezes. Normal effort.  CV: No MRG, 2+ pulses bilaterally upper and lower extremities, no JVD, normal capillary refill (less than 5 seconds).  Abdomen: Soft, nontender, nondistended with normal bowel sounds. No hepatosplenomegaly or palpable masses.  No other organomegaly. Extremities: Extremities are without clubbing, or cyanosis. No edema. Pedal pulses 2+ Skin: Warm and dry. No rashes, lesions or wounds on exposed surfaces of skin. Neuro: Alert and oriented times 4, moves all extremities equally well and symmetrically, 5/5 strength  bilaterally upper and lower extremities, gait and station not tested, cerebellar function intact, no clonus, 2+ DTR in prepatellar bilarterally with normal return.  Psych: Insight is good and judgment is appropriate. Mood and affect normal.   -speech shows obvious word finding difficulty, no slurring of speech  -memory appears intact   Data Review:    Labs: Basic Metabolic Panel: Recent Labs  Lab 08/30/17 1915 08/30/17 2009  NA 142 140  K 3.8 4.0  CL 106 106  CO2 25  --   GLUCOSE 99 96  BUN 22* 24*  CREATININE 0.92 1.00  CALCIUM 9.3  --    Liver Function Tests: Recent Labs  Lab 08/30/17 1915  AST 24  ALT 27  ALKPHOS 84  BILITOT 1.1  PROT 7.0  ALBUMIN 3.9   No results for input(s): LIPASE, AMYLASE in the last 168 hours. No results for input(s): AMMONIA in the last 168 hours. CBC: Recent Labs  Lab 08/30/17 1915 08/30/17 2009  WBC 12.6*  --   NEUTROABS 7.6  --   HGB 14.5 13.6  HCT 44.9 40.0  MCV 94.3  --   PLT 343  --    Cardiac Enzymes: No results for input(s): CKTOTAL, CKMB, CKMBINDEX, TROPONINI in the last 168 hours.  BNP (last 3  results) No results for input(s): PROBNP in the last 8760 hours. CBG: No results for input(s): GLUCAP in the last 168 hours.  Urinalysis    Component Value Date/Time   COLORURINE YELLOW 08/30/2017 1941   APPEARANCEUR CLEAR 08/30/2017 1941   LABSPEC 1.014 08/30/2017 1941   PHURINE 7.0 08/30/2017 1941   GLUCOSEU NEGATIVE 08/30/2017 1941   HGBUR NEGATIVE 08/30/2017 1941   BILIRUBINUR NEGATIVE 08/30/2017 1941   BILIRUBINUR neg 09/19/2014 1129   KETONESUR 5 (A) 08/30/2017 1941   PROTEINUR NEGATIVE 08/30/2017 1941   UROBILINOGEN negative 09/19/2014 1129   UROBILINOGEN 0.2 08/13/2010 1023   NITRITE NEGATIVE 08/30/2017 1941   LEUKOCYTESUR MODERATE (A) 08/30/2017 1941      Radiographic Studies: Ct Angio Head W Or Wo Contrast  Result Date: 08/30/2017 CLINICAL DATA:  83 y/o F; code stroke patient with weakness and slurred speech. History of motor neuron disease. EXAM: CT ANGIOGRAPHY HEAD AND NECK TECHNIQUE: Multidetector CT imaging of the head and neck was performed using the standard protocol during bolus administration of intravenous contrast. Multiplanar CT image reconstructions and MIPs were obtained to evaluate the vascular anatomy. Carotid stenosis measurements (when applicable) are obtained utilizing NASCET criteria, using the distal internal carotid diameter as the denominator. CONTRAST:  127mL ISOVUE-370 IOPAMIDOL (ISOVUE-370) INJECTION 76% COMPARISON:  08/30/2017 CT of the head. FINDINGS: CTA NECK FINDINGS Aortic arch: Standard branching. Imaged portion shows no evidence of aneurysm or dissection. No significant stenosis of the major arch vessel origins. Mild mixed plaque of the arch. Right carotid system: No evidence of dissection, stenosis (50% or greater) or occlusion. Mild calcific atherosclerosis of carotid bifurcation. Left carotid system: No evidence of dissection, stenosis (50% or greater) or occlusion. Mild calcific atherosclerosis of carotid bifurcation. Vertebral arteries:  Codominant. No evidence of dissection, stenosis (50% or greater) or occlusion. Skeleton: Large torus maxillaris. Mild cervical spondylosis with C6-7 loss of disc space height and endplate discogenic degenerative changes. No significant canal stenosis. Other neck: Subcentimeter nodules within bilateral thyroid lobes. Upper chest: Partially visualize left superior hilar lymphadenopathy. Review of the MIP images confirms the above findings CTA HEAD FINDINGS Anterior circulation: No significant stenosis, proximal occlusion, aneurysm, or vascular malformation. Non stenotic calcific atherosclerosis of  carotid siphons. Posterior circulation: No significant stenosis, proximal occlusion, aneurysm, or vascular malformation. Short segment of mild stenosis of right vertebral artery just proximal to vertebrobasilar junction. Fenestrated proximal basilar artery. Venous sinuses: As permitted by contrast timing, patent. Anatomic variants: No definite anterior or posterior communicating artery identified, likely hypoplastic or absent. Review of the MIP images confirms the above findings IMPRESSION: 1. Patent carotid and vertebral arteries. No dissection, aneurysm, or hemodynamically significant stenosis utilizing NASCET criteria. 2. Patent circle of Willis. No large vessel occlusion, aneurysm, or significant stenosis. 3. Mild calcific atherosclerosis of aortic arch, carotid bifurcations, and carotid siphons. These results were called by telephone at the time of interpretation on 08/30/2017 at 8:12 pm to Dr. Arne Cleveland , who verbally acknowledged these results. Electronically Signed   By: Kristine Garbe M.D.   On: 08/30/2017 20:13   Ct Angio Neck W Or Wo Contrast  Result Date: 08/30/2017 CLINICAL DATA:  83 y/o F; code stroke patient with weakness and slurred speech. History of motor neuron disease. EXAM: CT ANGIOGRAPHY HEAD AND NECK TECHNIQUE: Multidetector CT imaging of the head and neck was performed using the  standard protocol during bolus administration of intravenous contrast. Multiplanar CT image reconstructions and MIPs were obtained to evaluate the vascular anatomy. Carotid stenosis measurements (when applicable) are obtained utilizing NASCET criteria, using the distal internal carotid diameter as the denominator. CONTRAST:  17mL ISOVUE-370 IOPAMIDOL (ISOVUE-370) INJECTION 76% COMPARISON:  08/30/2017 CT of the head. FINDINGS: CTA NECK FINDINGS Aortic arch: Standard branching. Imaged portion shows no evidence of aneurysm or dissection. No significant stenosis of the major arch vessel origins. Mild mixed plaque of the arch. Right carotid system: No evidence of dissection, stenosis (50% or greater) or occlusion. Mild calcific atherosclerosis of carotid bifurcation. Left carotid system: No evidence of dissection, stenosis (50% or greater) or occlusion. Mild calcific atherosclerosis of carotid bifurcation. Vertebral arteries: Codominant. No evidence of dissection, stenosis (50% or greater) or occlusion. Skeleton: Large torus maxillaris. Mild cervical spondylosis with C6-7 loss of disc space height and endplate discogenic degenerative changes. No significant canal stenosis. Other neck: Subcentimeter nodules within bilateral thyroid lobes. Upper chest: Partially visualize left superior hilar lymphadenopathy. Review of the MIP images confirms the above findings CTA HEAD FINDINGS Anterior circulation: No significant stenosis, proximal occlusion, aneurysm, or vascular malformation. Non stenotic calcific atherosclerosis of carotid siphons. Posterior circulation: No significant stenosis, proximal occlusion, aneurysm, or vascular malformation. Short segment of mild stenosis of right vertebral artery just proximal to vertebrobasilar junction. Fenestrated proximal basilar artery. Venous sinuses: As permitted by contrast timing, patent. Anatomic variants: No definite anterior or posterior communicating artery identified, likely  hypoplastic or absent. Review of the MIP images confirms the above findings IMPRESSION: 1. Patent carotid and vertebral arteries. No dissection, aneurysm, or hemodynamically significant stenosis utilizing NASCET criteria. 2. Patent circle of Willis. No large vessel occlusion, aneurysm, or significant stenosis. 3. Mild calcific atherosclerosis of aortic arch, carotid bifurcations, and carotid siphons. These results were called by telephone at the time of interpretation on 08/30/2017 at 8:12 pm to Dr. Arne Cleveland , who verbally acknowledged these results. Electronically Signed   By: Kristine Garbe M.D.   On: 08/30/2017 20:13   Ct Head Code Stroke Wo Contrast  Result Date: 08/30/2017 CLINICAL DATA:  Code stroke. 83 y/o F; history of motor neuron disease presenting with weakness and slurred speech. EXAM: CT HEAD WITHOUT CONTRAST TECHNIQUE: Contiguous axial images were obtained from the base of the skull through the vertex without intravenous contrast.  COMPARISON:  None. FINDINGS: Brain: No evidence of acute infarction, hemorrhage, hydrocephalus, extra-axial collection or mass lesion/mass effect. Mild chronic microvascular ischemic changes and parenchymal volume loss of the brain. Lucency in right putamen may represent a prominent perivascular space or chronic lacunar infarction. Vascular: Calcific atherosclerosis of carotid siphons. Dense vessel in left anterior sylvian fissure may represent M2 thrombosis (series 2, image 12). Skull: Normal. Negative for fracture or focal lesion. Sinuses/Orbits: No acute finding. Other: Bilateral intra-ocular lens replacement. ASPECTS [facility] Stroke Program Early CT Score) - Ganglionic level infarction (caudate, lentiform nuclei, internal capsule, insula, M1-M3 cortex): 7 - Supraganglionic infarction (M4-M6 cortex): 3 Total score (0-10 with 10 being normal): 10 IMPRESSION: 1. No acute intracranial abnormality identified. 2. ASPECTS is 10 3. Dense vessel in left  anterior sylvian fissure may represent M2 thrombosis. These results were called by telephone at the time of interpretation on 08/30/2017 at 7:34 pm to Dr. Orlie Dakin , who verbally acknowledged these results. Electronically Signed   By: Kristine Garbe M.D.   On: 08/30/2017 19:35    EKG: Independently reviewed.    Assessment/Plan:   Active Problems:   CVA (cerebral vascular accident) ([place])  1. Likely left MCA distr CVA  -ASA daily  -consider stronger anticoag after 5-7 days post CVA  -check MRI  -check ECHO  -monitor  -permissive HTN  -check Lipids  2. Afib with ?rvr (off and on)  -stable, tolerate HR 120's    -avoid dropping bp  -asa daily for now  -stronger anticoag based on high CHADSVASC2 score  -ECHO  3. HTN  -as above   There is no height or weight on file to calculate BMI.  Other information:   DVT prophylaxis: SCDs acutely Code Status: Full code. Family Communication: son Disposition Plan: home Consults called: teleneuro Admission status: inpt    Tressie Ellis Ziyan Hillmer [facility]  08/30/2017, 9:18 PM

## 2017-08-30 NOTE — Progress Notes (Signed)
Patient started scan at [facility] Patient completed and [facility] radiology called @ [address] ER Dr. Winfred Leeds [phone]  Recently had ct head code stroke protocol exam

## 2017-08-30 NOTE — ED Provider Notes (Signed)
[facility] EMERGENCY DEPARTMENT Provider Note   CSN: [id number] Arrival date & time: 08/30/17  1910  Level 5 caveat acuity of situation code stroke History is obtained from paramedics and from her husband by telephone  History   Chief Complaint Chief Complaint  Patient presents with  . Code Stroke   Patient was last normal at 3 PM today when she complained of a headache.  Sometime after 3 PM she laid down and took a nap and awakened at 4 or 5 PM today with slurred speech.  EMS was called.  No treatment prior to coming here.  Patient cannot characterize her headache other than saying "I have a headache. HPI Nancy Mccormick is a 83 y.o. Mccormick.  HPI  Past Medical History:  Diagnosis Date  . Arthritis    back, left shoulder, and fingers  . Atrial fibrillation ([place])   . Hyperlipidemia   . Hypertension   . Osteoporosis     Patient Active Problem List   Diagnosis Date Noted  . Acute medial meniscal tear 07/03/2015  . Osteopenia 04/19/2014  . Palpitations 11/04/2013  . Arrhythmia 11/04/2013  . FIBRILLATION, ATRIAL 09/11/2010  . HYPERLIPIDEMIA 09/10/2010  . HYPERTENSION 09/10/2010    Past Surgical History:  Procedure Laterality Date  . CATARACT EXTRACTION    . KNEE ARTHROSCOPY Right 07/04/2015   Procedure: RIGHT KNEE ARTHROSCOPY WITH MEDIAL MENISCAL DEBRIDEMENT AND CHONDROPLASTY;  Surgeon: Gaynelle Arabian, MD;  Location: [facility];  Service: Orthopedics;  Laterality: Right;  . OPEN REDUCTION INTERNAL FIXATION (ORIF) DISTAL RADIAL FRACTURE Right 12/28/2012   Procedure: OPEN REDUCTION INTERNAL FIXATION (ORIF) DISTAL RADIAL FRACTURE;  Surgeon: Hessie Dibble, MD;  Location: [place];  Service: Orthopedics;  Laterality: Right;  Marland Kitchen VAGINAL HYSTERECTOMY      OB History    No data available       Home Medications    Prior to Admission medications   Medication Sig Start Date End Date Taking? Authorizing Provider  albuterol (PROVENTIL HFA;VENTOLIN HFA) 108  (90 Base) MCG/ACT inhaler Inhale 2 puffs into the lungs every 6 (six) hours as needed for wheezing or shortness of breath. Patient not taking: Reported on 08/25/2017 10/06/16   Sharion Balloon, FNP  aspirin 81 MG tablet Take 81 mg by mouth at bedtime.     [provider]  Calcium Carbonate-Vitamin D (CALCIUM + D PO) Take 1 tablet by mouth at bedtime.    [provider]  Casc[place] 450 MG CAPS Take 2 capsules by mouth daily.    [provider]  Cholecalciferol (VITAMIN D3) 1000 UNITS tablet Take 2,000 Units by mouth at bedtime.     [provider]  diltiazem (CARDIZEM CD) 120 MG 24 hr capsule TAKE ONE (1) CAPSULE EACH DAY 07/01/17   Chipper Herb, MD  fluticasone furoate-vilanterol (BREO ELLIPTA) 100-25 MCG/INH AEPB Inhale 1 puff into the lungs daily. Patient not taking: Reported on 08/25/2017 10/21/16   Sharion Balloon, FNP  lisinopril-hydrochlorothiazide (PRINZIDE,ZESTORETIC) 10-12.5 MG tablet TAKE 1 TABLET DAILY AT BEDTIME 05/28/17   Chipper Herb, MD  Multiple Vitamin (MULTIVITAMIN WITH MINERALS) TABS tablet Take 1 tablet by mouth at bedtime.    [provider]  nitroGLYCERIN (NITROSTAT) 0.4 MG SL tablet Place 1 tablet (0.4 mg total) under the tongue as needed. 06/20/14   Chipper Herb, MD  omega-3 acid ethyl esters (LOVAZA) 1 g capsule TAKE ONE CAPSULE BY MOUTH TWICE A DAY 07/01/17   Chipper Herb, MD  potassium chloride (K-DUR) 10 MEQ tablet TAKE ONE TABLET DAILY AT BEDTIME 08/28/17   Chipper Herb, MD  Psyllium (NAT-RUL PSYLLIUM SEED HUSKS) 500 MG CAPS Take 2 capsules by mouth daily.    [provider]    Family History Family History  Problem Relation Age of Onset  . Stroke Mother 36  . Anemia Mother   . Heart disease Mother   . Stroke Father 63  . Stroke Sister 50  . Hypertension Son     Social History Social History   Tobacco Use  . Smoking status: Former Smoker    Types: Cigarettes    Last attempt to quit:  12/17/1986    Years since quitting: 30.7  . Smokeless tobacco: Never Used  . Tobacco comment: Smoke briefly in the distant past  Substance Use Topics  . Alcohol use: No  . Drug use: No     Allergies   Atorvastatin; Crestor [rosuvastatin]; Livalo [pitavastatin]; and Shellfish allergy   Review of Systems Review of Systems  Unable to perform ROS: Acuity of condition  Neurological: Positive for speech difficulty and headaches.     Physical Exam Updated Vital Signs BP (!) 214/96 (BP Location: Right Arm)   Pulse 95   SpO2 98%   Physical Exam  Constitutional: She appears well-developed and well-nourished.  HENT:  Head: Normocephalic and atraumatic.  Eyes: Conjunctivae are normal. Pupils are equal, round, and reactive to light.  Neck: Neck supple. No tracheal deviation present. No thyromegaly present.  Cardiovascular: Normal rate.  No murmur heard. Irregularly irregular  Pulmonary/Chest: Effort normal and breath sounds normal.  Abdominal: Soft. Bowel sounds are normal. She exhibits no distension. There is no tenderness.  Musculoskeletal: Normal range of motion. She exhibits no edema or tenderness.  Neurological: She is alert. Coordination normal.  Strength 5/5 overall cranial nerves II through XII intact.  Speech is sometimes nonsensical and "word salad" intermittently she answers questions appropriately.  Upon asking her the month she states "the month before December" strength 5/5 overall DTR symmetric bilaterally knee jerk ankle jerk biceps toes downgoing bilaterally  Skin: Skin is warm and dry. No rash noted.  Psychiatric: She has a normal mood and affect.  Nursing note and vitals reviewed.    ED Treatments / Results  Labs (all labs ordered are listed, but only abnormal results are displayed) Labs Reviewed  CBC - Abnormal; Notable for the following components:      Result Value   WBC 12.6 (*)    All other components within normal limits  DIFFERENTIAL - Abnormal;  Notable for the following components:   Monocytes Absolute 1.2 (*)    All other components within normal limits  COMPREHENSIVE METABOLIC PANEL - Abnormal; Notable for the following components:   BUN 22 (*)    GFR calc non Af Amer 57 (*)    All other components within normal limits  ETHANOL  PROTIME-INR  APTT  RAPID URINE DRUG SCREEN, HOSP PERFORMED  URINALYSIS, ROUTINE W REFLEX MICROSCOPIC  I-STAT CHEM 8, ED  I-STAT TROPONIN, ED    EKG  EKG Interpretation None       Radiology Ct Head Code Stroke Wo Contrast  Result Date: 08/30/2017 CLINICAL DATA:  Code stroke. 83 y/o F; history of motor neuron disease presenting with weakness and slurred speech. EXAM: CT HEAD WITHOUT CONTRAST TECHNIQUE: Contiguous axial images were obtained from the base of the skull through the vertex without intravenous contrast. COMPARISON:  None. FINDINGS: Brain: No evidence of acute infarction,  hemorrhage, hydrocephalus, extra-axial collection or mass lesion/mass effect. Mild chronic microvascular ischemic changes and parenchymal volume loss of the brain. Lucency in right putamen may represent a prominent perivascular space or chronic lacunar infarction. Vascular: Calcific atherosclerosis of carotid siphons. Dense vessel in left anterior sylvian fissure may represent M2 thrombosis (series 2, image 12). Skull: Normal. Negative for fracture or focal lesion. Sinuses/Orbits: No acute finding. Other: Bilateral intra-ocular lens replacement. ASPECTS [facility] Stroke Program Early CT Score) - Ganglionic level infarction (caudate, lentiform nuclei, internal capsule, insula, M1-M3 cortex): 7 - Supraganglionic infarction (M4-M6 cortex): 3 Total score (0-10 with 10 being normal): 10 IMPRESSION: 1. No acute intracranial abnormality identified. 2. ASPECTS is 10 3. Dense vessel in left anterior sylvian fissure may represent M2 thrombosis. These results were called by telephone at the time of interpretation on 08/30/2017 at 7:34 pm  to Dr. Orlie Dakin , who verbally acknowledged these results. Electronically Signed   By: Kristine Garbe M.D.   On: 08/30/2017 19:35   Results for orders placed or performed during the hospital encounter of 08/30/17  Ethanol  Result Value Ref Range   Alcohol, Ethyl (B) <10 <10 mg/dL  Protime-INR  Result Value Ref Range   Prothrombin Time 12.6 11.4 - 15.2 seconds   INR 0.95   APTT  Result Value Ref Range   aPTT 28 24 - 36 seconds  CBC  Result Value Ref Range   WBC 12.6 (H) 4.0 - 10.5 K/uL   RBC 4.76 3.87 - 5.11 MIL/uL   Hemoglobin 14.5 12.0 - 15.0 g/dL   HCT 44.9 36.0 - 46.0 %   MCV 94.3 78.0 - 100.0 fL   MCH 30.5 26.0 - 34.0 pg   MCHC 32.3 30.0 - 36.0 g/dL   RDW 13.8 11.5 - 15.5 %   Platelets 343 150 - 400 K/uL  Differential  Result Value Ref Range   Neutrophils Relative % 61 %   Neutro Abs 7.6 1.7 - 7.7 K/uL   Lymphocytes Relative 28 %   Lymphs Abs 3.6 0.7 - 4.0 K/uL   Monocytes Relative 9 %   Monocytes Absolute 1.2 (H) 0.1 - 1.0 K/uL   Eosinophils Relative 2 %   Eosinophils Absolute 0.3 0.0 - 0.7 K/uL   Basophils Relative 0 %   Basophils Absolute 0.0 0.0 - 0.1 K/uL  Comprehensive metabolic panel  Result Value Ref Range   Sodium 142 135 - 145 mmol/L   Potassium 3.8 3.5 - 5.1 mmol/L   Chloride 106 101 - 111 mmol/L   CO2 25 22 - 32 mmol/L   Glucose, Bld 99 65 - 99 mg/dL   BUN 22 (H) 6 - 20 mg/dL   Creatinine, Ser 0.92 0.44 - 1.00 mg/dL   Calcium 9.3 8.9 - 10.3 mg/dL   Total Protein 7.0 6.5 - 8.1 g/dL   Albumin 3.9 3.5 - 5.0 g/dL   AST 24 15 - 41 U/L   ALT 27 14 - 54 U/L   Alkaline Phosphatase 84 Nancy - 126 U/L   Total Bilirubin 1.1 0.3 - 1.2 mg/dL   GFR calc non Af Amer 57 (L) >60 mL/min   GFR calc Af Amer >60 >60 mL/min   Anion gap 11 5 - 15  Urine rapid drug screen (hosp performed)  Result Value Ref Range   Opiates NONE DETECTED NONE DETECTED   Cocaine NONE DETECTED NONE DETECTED   Benzodiazepines NONE DETECTED NONE DETECTED   Amphetamines NONE  DETECTED NONE DETECTED   Tetrahydrocannabinol NONE DETECTED  NONE DETECTED   Barbiturates NONE DETECTED NONE DETECTED  Urinalysis, Routine w reflex microscopic  Result Value Ref Range   Color, Urine YELLOW YELLOW   APPearance CLEAR CLEAR   Specific Gravity, Urine 1.014 1.005 - 1.030   pH 7.0 5.0 - 8.0   Glucose, UA NEGATIVE NEGATIVE mg/dL   Hgb urine dipstick NEGATIVE NEGATIVE   Bilirubin Urine NEGATIVE NEGATIVE   Ketones, ur 5 (A) NEGATIVE mg/dL   Protein, ur NEGATIVE NEGATIVE mg/dL   Nitrite NEGATIVE NEGATIVE   Leukocytes, UA MODERATE (A) NEGATIVE   RBC / HPF 6-30 0 - 5 RBC/hpf   WBC, UA 6-30 0 - 5 WBC/hpf   Bacteria, UA NONE SEEN NONE SEEN   Squamous Epithelial / LPF 0-5 (A) NONE SEEN   Mucus PRESENT   I-Stat Chem 8, ED  Result Value Ref Range   Sodium 140 135 - 145 mmol/L   Potassium 4.0 3.5 - 5.1 mmol/L   Chloride 106 101 - 111 mmol/L   BUN 24 (H) 6 - 20 mg/dL   Creatinine, Ser 1.00 0.44 - 1.00 mg/dL   Glucose, Bld 96 65 - 99 mg/dL   Calcium, Ion 1.08 (L) 1.15 - 1.40 mmol/L   TCO2 27 22 - 32 mmol/L   Hemoglobin 13.6 12.0 - 15.0 g/dL   HCT 40.0 36.0 - 46.0 %  I-stat troponin, ED  Result Value Ref Range   Troponin i, poc 0.00 0.00 - 0.08 ng/mL   Comment 3           Ct Angio Head W Or Wo Contrast  Result Date: 08/30/2017 CLINICAL DATA:  N[age] y/o F; code stroke patient with weakness and slurred speech. History of motor neuron disease. EXAM: CT ANGIOGRAPHY HEAD AND NECK TECHNIQUE: Multidetector CT imaging of the head and neck was performed using the standard protocol during bolus administration of intravenous contrast. Multiplanar CT image reconstructions and MIPs were obtained to evaluate the vascular anatomy. Carotid stenosis measurements (when applicable) are obtained utilizing NASCET criteria, using the distal internal carotid diameter as the denominator. CONTRAST:  133mL ISOVUE-370 IOPAMIDOL (ISOVUE-370) INJECTION 76% COMPARISON:  08/30/2017 CT of the head. FINDINGS: CTA  NECK FINDINGS Aortic arch: Standard branching. Imaged portion shows no evidence of aneurysm or dissection. No significant stenosis of the major arch vessel origins. Mild mixed plaque of the arch. Right carotid system: No evidence of dissection, stenosis (50% or greater) or occlusion. Mild calcific atherosclerosis of carotid bifurcation. Left carotid system: No evidence of dissection, stenosis (50% or greater) or occlusion. Mild calcific atherosclerosis of carotid bifurcation. Vertebral arteries: Codominant. No evidence of dissection, stenosis (50% or greater) or occlusion. Skeleton: Large torus maxillaris. Mild cervical spondylosis with C6-7 loss of disc space height and endplate discogenic degenerative changes. No significant canal stenosis. Other neck: Subcentimeter nodules within bilateral thyroid lobes. Upper chest: Partially visualize left superior hilar lymphadenopathy. Review of the MIP images confirms the above findings CTA HEAD FINDINGS Anterior circulation: No significant stenosis, proximal occlusion, aneurysm, or vascular malformation. Non stenotic calcific atherosclerosis of carotid siphons. Posterior circulation: No significant stenosis, proximal occlusion, aneurysm, or vascular malformation. Short segment of mild stenosis of right vertebral artery just proximal to vertebrobasilar junction. Fenestrated proximal basilar artery. Venous sinuses: As permitted by contrast timing, patent. Anatomic variants: No definite anterior or posterior communicating artery identified, likely hypoplastic or absent. Review of the MIP images confirms the above findings IMPRESSION: 1. Patent carotid and vertebral arteries. No dissection, aneurysm, or hemodynamically significant stenosis utilizing NASCET criteria. 2. Patent  circle of Willis. No large vessel occlusion, aneurysm, or significant stenosis. 3. Mild calcific atherosclerosis of aortic arch, carotid bifurcations, and carotid siphons. These results were called by  telephone at the time of interpretation on 08/30/2017 at 8:12 pm to Dr. Arne Cleveland , who verbally acknowledged these results. Electronically Signed   By: Kristine Garbe M.D.   On: 08/30/2017 20:13   Ct Angio Neck W Or Wo Contrast  Result Date: 08/30/2017 CLINICAL DATA:  83 y/o F; code stroke patient with weakness and slurred speech. History of motor neuron disease. EXAM: CT ANGIOGRAPHY HEAD AND NECK TECHNIQUE: Multidetector CT imaging of the head and neck was performed using the standard protocol during bolus administration of intravenous contrast. Multiplanar CT image reconstructions and MIPs were obtained to evaluate the vascular anatomy. Carotid stenosis measurements (when applicable) are obtained utilizing NASCET criteria, using the distal internal carotid diameter as the denominator. CONTRAST:  162mL ISOVUE-370 IOPAMIDOL (ISOVUE-370) INJECTION 76% COMPARISON:  08/30/2017 CT of the head. FINDINGS: CTA NECK FINDINGS Aortic arch: Standard branching. Imaged portion shows no evidence of aneurysm or dissection. No significant stenosis of the major arch vessel origins. Mild mixed plaque of the arch. Right carotid system: No evidence of dissection, stenosis (50% or greater) or occlusion. Mild calcific atherosclerosis of carotid bifurcation. Left carotid system: No evidence of dissection, stenosis (50% or greater) or occlusion. Mild calcific atherosclerosis of carotid bifurcation. Vertebral arteries: Codominant. No evidence of dissection, stenosis (50% or greater) or occlusion. Skeleton: Large torus maxillaris. Mild cervical spondylosis with C6-7 loss of disc space height and endplate discogenic degenerative changes. No significant canal stenosis. Other neck: Subcentimeter nodules within bilateral thyroid lobes. Upper chest: Partially visualize left superior hilar lymphadenopathy. Review of the MIP images confirms the above findings CTA HEAD FINDINGS Anterior circulation: No significant stenosis,  proximal occlusion, aneurysm, or vascular malformation. Non stenotic calcific atherosclerosis of carotid siphons. Posterior circulation: No significant stenosis, proximal occlusion, aneurysm, or vascular malformation. Short segment of mild stenosis of right vertebral artery just proximal to vertebrobasilar junction. Fenestrated proximal basilar artery. Venous sinuses: As permitted by contrast timing, patent. Anatomic variants: No definite anterior or posterior communicating artery identified, likely hypoplastic or absent. Review of the MIP images confirms the above findings IMPRESSION: 1. Patent carotid and vertebral arteries. No dissection, aneurysm, or hemodynamically significant stenosis utilizing NASCET criteria. 2. Patent circle of Willis. No large vessel occlusion, aneurysm, or significant stenosis. 3. Mild calcific atherosclerosis of aortic arch, carotid bifurcations, and carotid siphons. These results were called by telephone at the time of interpretation on 08/30/2017 at 8:12 pm to Dr. Arne Cleveland , who verbally acknowledged these results. Electronically Signed   By: Kristine Garbe M.D.   On: 08/30/2017 20:13   Ct Head Code Stroke Wo Contrast  Result Date: 08/30/2017 CLINICAL DATA:  Code stroke. 83 y/o F; history of motor neuron disease presenting with weakness and slurred speech. EXAM: CT HEAD WITHOUT CONTRAST TECHNIQUE: Contiguous axial images were obtained from the base of the skull through the vertex without intravenous contrast. COMPARISON:  None. FINDINGS: Brain: No evidence of acute infarction, hemorrhage, hydrocephalus, extra-axial collection or mass lesion/mass effect. Mild chronic microvascular ischemic changes and parenchymal volume loss of the brain. Lucency in right putamen may represent a prominent perivascular space or chronic lacunar infarction. Vascular: Calcific atherosclerosis of carotid siphons. Dense vessel in left anterior sylvian fissure may represent M2 thrombosis  (series 2, image 12). Skull: Normal. Negative for fracture or focal lesion. Sinuses/Orbits: No acute finding. Other: Bilateral intra-ocular  lens replacement. ASPECTS [facility] Stroke Program Early CT Score) - Ganglionic level infarction (caudate, lentiform nuclei, internal capsule, insula, M1-M3 cortex): 7 - Supraganglionic infarction (M4-M6 cortex): 3 Total score (0-10 with 10 being normal): 10 IMPRESSION: 1. No acute intracranial abnormality identified. 2. ASPECTS is 10 3. Dense vessel in left anterior sylvian fissure may represent M2 thrombosis. These results were called by telephone at the time of interpretation on 08/30/2017 at 7:34 pm to Dr. Orlie Dakin , who verbally acknowledged these results. Electronically Signed   By: Kristine Garbe M.D.   On: 08/30/2017 19:35   Procedures Procedures (including critical care time)  Medications Ordered in ED Medications  iopamidol (ISOVUE-370) 76 % injection 100 mL (100 mLs Intravenous Contrast Given 08/30/17 1952)   Code stroke was called in the field.  And continued upon arrival here.  Initial Impression / Assessment and Plan / ED Course  I have reviewed the triage vital signs and the nursing notes.  Pertinent labs & imaging results that were available during my care of the patient were reviewed by me and considered in my medical decision making (see chart for details).     825 PM patient complains of nausea and headache.  IV Reglan ordered.  She did pass swallow screen.   Telemetry neurologist consultant recommends that she does not need acute intervention.  She can stay at [facility] for further stroke evaluation.  He recommends antiplatelet therapy.  Aspirin ordered .at 830p pm her  remains unchanged.  Sometimes speaking in nonsensical terms and sometimes answering appropriately.  She moves all extremities  Final Clinical Impressions(s) / ED Diagnoses  Diagnosis acute stroke with dysarthria CRITICAL CARE Performed by: Orlie Dakin Total critical care time: 45 minutes Critical care time was exclusive of separately billable procedures and treating other patients. Critical care was necessary to treat or prevent imminent or life-threatening deterioration. Critical care was time spent personally by me on the following activities: development of treatment plan with patient and/or surrogate as well as nursing, discussions with consultants, evaluation of patient's response to treatment, examination of patient, obtaining history from patient or surrogate, ordering and performing treatments and interventions, ordering and review of laboratory studies, ordering and review of radiographic studies, pulse oximetry and re-evaluation of patient's condition. Final diagnoses:  None    ED Discharge Orders    None       Orlie Dakin, MD 08/30/17 2053

## 2017-08-31 ENCOUNTER — Inpatient Hospital Stay (HOSPITAL_COMMUNITY): Payer: Medicare HMO

## 2017-08-31 ENCOUNTER — Other Ambulatory Visit: Payer: Self-pay

## 2017-08-31 DIAGNOSIS — I639 Cerebral infarction, unspecified: Secondary | ICD-10-CM | POA: Diagnosis not present

## 2017-08-31 DIAGNOSIS — I6789 Other cerebrovascular disease: Secondary | ICD-10-CM

## 2017-08-31 DIAGNOSIS — R4182 Altered mental status, unspecified: Secondary | ICD-10-CM | POA: Diagnosis not present

## 2017-08-31 DIAGNOSIS — I480 Paroxysmal atrial fibrillation: Secondary | ICD-10-CM

## 2017-08-31 DIAGNOSIS — R4701 Aphasia: Secondary | ICD-10-CM | POA: Diagnosis not present

## 2017-08-31 DIAGNOSIS — E785 Hyperlipidemia, unspecified: Secondary | ICD-10-CM

## 2017-08-31 LAB — COMPREHENSIVE METABOLIC PANEL
ALK PHOS: 72 U/L (ref 38–126)
ALT: 22 U/L (ref 14–54)
ANION GAP: 11 (ref 5–15)
AST: 19 U/L (ref 15–41)
Albumin: 3.6 g/dL (ref 3.5–5.0)
BILIRUBIN TOTAL: 1.3 mg/dL — AB (ref 0.3–1.2)
BUN: 16 mg/dL (ref 6–20)
CALCIUM: 8.9 mg/dL (ref 8.9–10.3)
CO2: 24 mmol/L (ref 22–32)
Chloride: 103 mmol/L (ref 101–111)
Creatinine, Ser: 0.65 mg/dL (ref 0.44–1.00)
Glucose, Bld: 107 mg/dL — ABNORMAL HIGH (ref 65–99)
POTASSIUM: 3.4 mmol/L — AB (ref 3.5–5.1)
Sodium: 138 mmol/L (ref 135–145)
Total Protein: 6.6 g/dL (ref 6.5–8.1)

## 2017-08-31 LAB — LIPID PANEL
CHOL/HDL RATIO: 4.2 ratio
Cholesterol: 256 mg/dL — ABNORMAL HIGH (ref 0–200)
HDL: 61 mg/dL (ref >40)
LDL CALC: 163 mg/dL — AB (ref 0–99)
TRIGLYCERIDES: 159 mg/dL — AB (ref <150)
VLDL: 32 mg/dL (ref 0–40)

## 2017-08-31 LAB — CBC
HEMATOCRIT: 43.2 % (ref 36.0–46.0)
HEMOGLOBIN: 13.8 g/dL (ref 12.0–15.0)
MCH: 30.1 pg (ref 26.0–34.0)
MCHC: 31.9 g/dL (ref 30.0–36.0)
MCV: 94.3 fL (ref 78.0–100.0)
Platelets: 353 10*3/uL (ref 150–400)
RBC: 4.58 MIL/uL (ref 3.87–5.11)
RDW: 13.9 % (ref 11.5–15.5)
WBC: 13.4 10*3/uL — ABNORMAL HIGH (ref 4.0–10.5)

## 2017-08-31 LAB — ECHOCARDIOGRAM COMPLETE
Height: 63.5 in
Weight: 2987.67 oz

## 2017-08-31 NOTE — Progress Notes (Signed)
PROGRESS NOTE  Nancy Mccormick  QHU:[id number]  DOB: Feb 15, 1934  DOA: 08/30/2017 PCP: Chipper Herb, MD   Brief Admission Hx: Nancy Mccormick is an 83 y.o. female with no previous CVA or CAD who reports "I had afib once" who takes ASA daily, who had a HA today (all over) without NV or neuro changes. Apparently, sometime after waking up this afternoon (maybe around 15:30) noted by family to have trouble finding words.  She was admitted with acute CVA.   MDM/Assessment & Plan:   1. Small acute cortical infarct in the left MCA watershed area-no hemorrhage or mass-effect seen on MRI study.  Patient is on aspirin for antiplatelet therapy.  Continue stroke workup.  Neurology consult requested. 2. Dyslipidemia-patient reports an intolerance to statins.  She reports that she has tried multiple statins and was not able to tolerate any of them.  Fortunately her LDL is 163 which is suboptimally controlled. 3. Paroxysmal atrial fibrillation-heart rate controlled with diltiazem.  Given her high CHADSVASC score neurology may want her on stronger anticoagulation. 4. Essential hypertension-stable and controlled.  DVT prophylaxis: SCDs Code Status: Full Family Communication: Bedside Disposition Plan: To be determined   Consultants:  neurology  Subjective: Patient reports that she feels weak otherwise no other complaints.  Objective: Vitals:   08/31/17 0200 08/31/17 0400 08/31/17 0600 08/31/17 0800  BP: (!) 145/78 119/63 (!) 112/57 (!) 148/52  Pulse: (!) 107 99 100 (!) 51  Resp: 16 16 16    Temp: 98.8 F (37.1 C) 98.9 F (37.2 C) 98.7 F (37.1 C) 98.6 F (37 C)  TempSrc: Oral Oral  Oral  SpO2: 98% 98% 98% 98%  Weight:      Height:       No intake or output data in the 24 hours ending 08/31/17 1001 Filed Weights   08/30/17 2200  Weight: 84.7 kg (186 lb 11.7 oz)     REVIEW OF SYSTEMS  As per history otherwise all reviewed and reported negative  Exam:  General exam: awake,  alert, NAD. No speech impediment noted.  Respiratory system: Clear. No increased work of breathing. Cardiovascular system: S1 & S2 heard.  Gastrointestinal system: Abdomen is nondistended, soft and nontender. Normal bowel sounds heard. Central nervous system: Alert and oriented. No focal neurological deficits. Extremities: no CCE.  Data Reviewed: Basic Metabolic Panel: Recent Labs  Lab 08/30/17 1915 08/30/17 2009 08/31/17 0603  NA 142 140 138  K 3.8 4.0 3.4*  CL 106 106 103  CO2 25  --  24  GLUCOSE 99 96 107*  BUN 22* 24* 16  CREATININE 0.92 1.00 0.65  CALCIUM 9.3  --  8.9   Liver Function Tests: Recent Labs  Lab 08/30/17 1915 08/31/17 0603  AST 24 19  ALT 27 22  ALKPHOS 84 72  BILITOT 1.1 1.3*  PROT 7.0 6.6  ALBUMIN 3.9 3.6   No results for input(s): LIPASE, AMYLASE in the last 168 hours. No results for input(s): AMMONIA in the last 168 hours. CBC: Recent Labs  Lab 08/30/17 1915 08/30/17 2009 08/31/17 0603  WBC 12.6*  --  13.4*  NEUTROABS 7.6  --   --   HGB 14.5 13.6 13.8  HCT 44.9 40.0 43.2  MCV 94.3  --  94.3  PLT 343  --  353   Cardiac Enzymes: No results for input(s): CKTOTAL, CKMB, CKMBINDEX, TROPONINI in the last 168 hours. CBG (last 3)  No results for input(s): GLUCAP in the last 72 hours. No results found for  this or any previous visit (from the past 240 hour(s)).   Studies: Ct Angio Head W Or Wo Contrast  Result Date: 08/30/2017 CLINICAL DATA:  83 y/o F; code stroke patient with weakness and slurred speech. History of motor neuron disease. EXAM: CT ANGIOGRAPHY HEAD AND NECK TECHNIQUE: Multidetector CT imaging of the head and neck was performed using the standard protocol during bolus administration of intravenous contrast. Multiplanar CT image reconstructions and MIPs were obtained to evaluate the vascular anatomy. Carotid stenosis measurements (when applicable) are obtained utilizing NASCET criteria, using the distal internal carotid diameter as  the denominator. CONTRAST:  166mL ISOVUE-370 IOPAMIDOL (ISOVUE-370) INJECTION 76% COMPARISON:  08/30/2017 CT of the head. FINDINGS: CTA NECK FINDINGS Aortic arch: Standard branching. Imaged portion shows no evidence of aneurysm or dissection. No significant stenosis of the major arch vessel origins. Mild mixed plaque of the arch. Right carotid system: No evidence of dissection, stenosis (50% or greater) or occlusion. Mild calcific atherosclerosis of carotid bifurcation. Left carotid system: No evidence of dissection, stenosis (50% or greater) or occlusion. Mild calcific atherosclerosis of carotid bifurcation. Vertebral arteries: Codominant. No evidence of dissection, stenosis (50% or greater) or occlusion. Skeleton: Large torus maxillaris. Mild cervical spondylosis with C6-7 loss of disc space height and endplate discogenic degenerative changes. No significant canal stenosis. Other neck: Subcentimeter nodules within bilateral thyroid lobes. Upper chest: Partially visualize left superior hilar lymphadenopathy. Review of the MIP images confirms the above findings CTA HEAD FINDINGS Anterior circulation: No significant stenosis, proximal occlusion, aneurysm, or vascular malformation. Non stenotic calcific atherosclerosis of carotid siphons. Posterior circulation: No significant stenosis, proximal occlusion, aneurysm, or vascular malformation. Short segment of mild stenosis of right vertebral artery just proximal to vertebrobasilar junction. Fenestrated proximal basilar artery. Venous sinuses: As permitted by contrast timing, patent. Anatomic variants: No definite anterior or posterior communicating artery identified, likely hypoplastic or absent. Review of the MIP images confirms the above findings IMPRESSION: 1. Patent carotid and vertebral arteries. No dissection, aneurysm, or hemodynamically significant stenosis utilizing NASCET criteria. 2. Patent circle of Willis. No large vessel occlusion, aneurysm, or significant  stenosis. 3. Mild calcific atherosclerosis of aortic arch, carotid bifurcations, and carotid siphons. These results were called by telephone at the time of interpretation on 08/30/2017 at 8:12 pm to Dr. Arne Cleveland , who verbally acknowledged these results. Electronically Signed   By: Kristine Garbe M.D.   On: 08/30/2017 20:13   Ct Angio Neck W Or Wo Contrast  Result Date: 08/30/2017 CLINICAL DATA:  83 y/o F; code stroke patient with weakness and slurred speech. History of motor neuron disease. EXAM: CT ANGIOGRAPHY HEAD AND NECK TECHNIQUE: Multidetector CT imaging of the head and neck was performed using the standard protocol during bolus administration of intravenous contrast. Multiplanar CT image reconstructions and MIPs were obtained to evaluate the vascular anatomy. Carotid stenosis measurements (when applicable) are obtained utilizing NASCET criteria, using the distal internal carotid diameter as the denominator. CONTRAST:  165mL ISOVUE-370 IOPAMIDOL (ISOVUE-370) INJECTION 76% COMPARISON:  08/30/2017 CT of the head. FINDINGS: CTA NECK FINDINGS Aortic arch: Standard branching. Imaged portion shows no evidence of aneurysm or dissection. No significant stenosis of the major arch vessel origins. Mild mixed plaque of the arch. Right carotid system: No evidence of dissection, stenosis (50% or greater) or occlusion. Mild calcific atherosclerosis of carotid bifurcation. Left carotid system: No evidence of dissection, stenosis (50% or greater) or occlusion. Mild calcific atherosclerosis of carotid bifurcation. Vertebral arteries: Codominant. No evidence of dissection, stenosis (50% or greater)  or occlusion. Skeleton: Large torus maxillaris. Mild cervical spondylosis with C6-7 loss of disc space height and endplate discogenic degenerative changes. No significant canal stenosis. Other neck: Subcentimeter nodules within bilateral thyroid lobes. Upper chest: Partially visualize left superior hilar  lymphadenopathy. Review of the MIP images confirms the above findings CTA HEAD FINDINGS Anterior circulation: No significant stenosis, proximal occlusion, aneurysm, or vascular malformation. Non stenotic calcific atherosclerosis of carotid siphons. Posterior circulation: No significant stenosis, proximal occlusion, aneurysm, or vascular malformation. Short segment of mild stenosis of right vertebral artery just proximal to vertebrobasilar junction. Fenestrated proximal basilar artery. Venous sinuses: As permitted by contrast timing, patent. Anatomic variants: No definite anterior or posterior communicating artery identified, likely hypoplastic or absent. Review of the MIP images confirms the above findings IMPRESSION: 1. Patent carotid and vertebral arteries. No dissection, aneurysm, or hemodynamically significant stenosis utilizing NASCET criteria. 2. Patent circle of Willis. No large vessel occlusion, aneurysm, or significant stenosis. 3. Mild calcific atherosclerosis of aortic arch, carotid bifurcations, and carotid siphons. These results were called by telephone at the time of interpretation on 08/30/2017 at 8:12 pm to Dr. Arne Cleveland , who verbally acknowledged these results. Electronically Signed   By: Kristine Garbe M.D.   On: 08/30/2017 20:13   Mr Brain Wo Contrast  Result Date: 08/31/2017 CLINICAL DATA:  83 year old female with weakness and slurred speech for 2 days. EXAM: MRI HEAD WITHOUT CONTRAST TECHNIQUE: Multiplanar, multiecho pulse sequences of the brain and surrounding structures were obtained without intravenous contrast. COMPARISON:  Head CT and CTA head and neck 08/30/2017. FINDINGS: Brain: Cerebral volume is within normal limits for age. There is a small 10 mm area of restricted diffusion in the posterior left parietal cortex near midline on series 3, image 92. Mild T2 and FLAIR hyperintensity with no hemorrhage or mass effect. No other restricted diffusion. There is patchy  bilateral cerebral white matter T2 and FLAIR hyperintensity, but more so in the right hemisphere. No chronic cortical encephalomalacia identified. Questionable small chronic microhemorrhage in the inferior left basal ganglia. Incidental perivascular space in the inferior right basal ganglia. Other bilateral basal ganglia T2 heterogeneity may represent a combination of perivascular spaces and small chronic lacunar infarcts. Negative brainstem and cerebellum. No midline shift, mass effect, evidence of mass lesion, ventriculomegaly, extra-axial collection or acute intracranial hemorrhage. Cervicomedullary junction and pituitary are within normal limits. Vascular: Major intracranial vascular flow voids are preserved. Skull and upper cervical spine: Negative. Visualized bone marrow signal is within normal limits. Sinuses/Orbits: Normal orbits soft tissues aside from postoperative changes to both globes. Visualized paranasal sinuses and mastoids are stable and well pneumatized. Other: Visible internal auditory structures appear normal. Negative scalp and orbits soft tissues. IMPRESSION: 1. Small acute cortical infarct in the posterior left MCA or left MCA/ACA watershed area. No hemorrhage or mass effect. 2. No other acute intracranial abnormality. Relatively mild for age signal changes elsewhere suggestive of chronic small vessel disease. Electronically Signed   By: Genevie Ann M.D.   On: 08/31/2017 09:56   Ct Head Code Stroke Wo Contrast  Result Date: 08/30/2017 CLINICAL DATA:  Code stroke. 83 y/o F; history of motor neuron disease presenting with weakness and slurred speech. EXAM: CT HEAD WITHOUT CONTRAST TECHNIQUE: Contiguous axial images were obtained from the base of the skull through the vertex without intravenous contrast. COMPARISON:  None. FINDINGS: Brain: No evidence of acute infarction, hemorrhage, hydrocephalus, extra-axial collection or mass lesion/mass effect. Mild chronic microvascular ischemic changes and  parenchymal volume loss of the  brain. Lucency in right putamen may represent a prominent perivascular space or chronic lacunar infarction. Vascular: Calcific atherosclerosis of carotid siphons. Dense vessel in left anterior sylvian fissure may represent M2 thrombosis (series 2, image 12). Skull: Normal. Negative for fracture or focal lesion. Sinuses/Orbits: No acute finding. Other: Bilateral intra-ocular lens replacement. ASPECTS [facility] Stroke Program Early CT Score) - Ganglionic level infarction (caudate, lentiform nuclei, internal capsule, insula, M1-M3 cortex): 7 - Supraganglionic infarction (M4-M6 cortex): 3 Total score (0-10 with 10 being normal): 10 IMPRESSION: 1. No acute intracranial abnormality identified. 2. ASPECTS is 10 3. Dense vessel in left anterior sylvian fissure may represent M2 thrombosis. These results were called by telephone at the time of interpretation on 08/30/2017 at 7:34 pm to Dr. Orlie Dakin , who verbally acknowledged these results. Electronically Signed   By: Kristine Garbe M.D.   On: 08/30/2017 19:35   Scheduled Meds: .  stroke: mapping our early stages of recovery book   Does not apply Once  . aspirin EC  81 mg Oral Daily  . diltiazem  120 mg Oral Daily   Continuous Infusions:  Active Problems:   CVA (cerebral vascular accident) [facility])  Time spent:   Irwin Brakeman, MD, FAAFP [facility] Pager [phone]  If 7PM-7AM, please contact night-coverage [email or web address] Password [email or web address] 08/31/2017, 10:01 AM    LOS: 1 day

## 2017-08-31 NOTE — Progress Notes (Signed)
*  PRELIMINARY RESULTS* Echocardiogram 2D Echocardiogram has been performed.  Nancy Mccormick 08/31/2017, 2:43 PM

## 2017-08-31 NOTE — Plan of Care (Signed)
progressing 

## 2017-08-31 NOTE — Plan of Care (Signed)
  Acute Rehab PT Goals(only PT should resolve) Pt Will Ambulate 08/31/2017 1053 - Progressing by Lonell Grandchild, PT Flowsheets Taken 08/31/2017 1053  Pt will Ambulate > 125 feet;Independently Pt Will Go Up/Down Stairs 08/31/2017 1053 - Progressing by Lonell Grandchild, PT Flowsheets Taken 08/31/2017 1053  Pt will Go Up / Down Stairs 3-5 stairs;with modified independence  10:54 AM, 08/31/17 Lonell Grandchild, MPT Physical Therapist with [facility] [phone] office [phone] mobile phone

## 2017-08-31 NOTE — Plan of Care (Signed)
Pt is progressing 

## 2017-08-31 NOTE — Evaluation (Signed)
Occupational Therapy Evaluation Patient Details Name: Nancy Mccormick MRN: [id number] DOB: 05-Nov-1933 Today's Date: 08/31/2017    History of Present Illness Nancy Mccormick is an 83 y.o. female with no previous CVA or CAD who reports "I had afib once" who takes ASA daily, who had a HA today (all over) without NV or neuro changes. Apparently, sometime after waking up this afternoon (maybe around 15:30) noted by family to have trouble finding words. Was able to follow physical commands and could hear well.   Clinical Impression   Pt agreeable to participate in OT evaluation. Patient presents with 4+/5 BUE strength, intact fine and gross motor coordination and is able to donn hospital socks independently. No deficits noted during evaluation. Patient does not require follow up OT services.     Follow Up Recommendations  No OT follow up    Equipment Recommendations  None recommended by OT       Precautions / Restrictions   None     Mobility Bed Mobility Overal bed mobility: Independent                Transfers Overall transfer level: Independent Equipment used: None                      ADL either performed or assessed with clinical judgement   ADL Overall ADL's : Modified independent;At baseline            Vision Baseline Vision/History: Wears glasses Wears Glasses: At all times Patient Visual Report: No change from baseline Vision Assessment?: No apparent visual deficits            Pertinent Vitals/Pain Pain Assessment: No/denies pain     Hand Dominance  Right handed   Extremity/Trunk Assessment Upper Extremity Assessment Upper Extremity Assessment: Overall WFL for tasks assessed   Lower Extremity Assessment Lower Extremity Assessment: Defer to PT evaluation       Communication Communication Communication: No difficulties;Other (comment)(a little slow to respond although clear.)   Cognition Arousal/Alertness: Awake/alert Behavior  During Therapy: WFL for tasks assessed/performed Overall Cognitive Status: Within Functional Limits for tasks assessed                    Home Living Family/patient expects to be discharged to:: Private residence Living Arrangements: Spouse/significant other Available Help at Discharge: Family(Son lives with patient although works out of town.) Type of Home: House Home Access: Stairs to enter;Ramped entrance [employer] of Steps: 1 step Entrance Stairs-Rails: None Home Layout: One level     Bathroom Shower/Tub: Teacher, early years/pre: [place]: Bedside commode;Hospital bed;Walker - 2 wheels   Additional Comments: Aide comes twice a week for 4 hours to assist with husband.      Prior Functioning/Environment Level of Independence: Independent                                       Co-evaluation PT/OT/SLP Co-Evaluation/Treatment: Yes Reason for Co-Treatment: To address functional/ADL transfers   OT goals addressed during session: ADL's and self-care;Strengthening/ROM      AM-PAC PT "6 Clicks" Daily Activity     Outcome Measure Help from another person eating meals?: None Help from another person taking care of personal grooming?: None Help from another person toileting, which includes using toliet, bedpan, or urinal?: None Help from another person bathing (including washing, rinsing, drying)?:  None Help from another person to put on and taking off regular upper body clothing?: None Help from another person to put on and taking off regular lower body clothing?: None 6 Click Score: 24   End of Session    Activity Tolerance: Patient tolerated treatment well Patient left: in chair;with call bell/phone within reach  OT Visit Diagnosis: Muscle weakness (generalized) (M62.81)                Time: 7124-5809 OT Time Calculation (min): 25 min Charges:  OT General Charges $OT Visit: 1 Visit OT Evaluation $OT Eval  Low Complexity: 1 Low G-Codes: OT G-codes **NOT FOR INPATIENT CLASS** Functional Assessment Tool Used: AM-PAC 6 Clicks Daily Activity Functional Limitation: Self care Self Care Current Status (X8338): At least 1 percent but less than 20 percent impaired, limited or restricted Self Care Goal Status (S5053): At least 1 percent but less than 20 percent impaired, limited or restricted Self Care Discharge Status [phone]): At least 1 percent but less than 20 percent impaired, limited or restricted   Nancy Mccormick, OTR/L,CBIS  [phone]   Nancy Mccormick, Nancy Mccormick 08/31/2017, 10:24 AM

## 2017-08-31 NOTE — Evaluation (Signed)
Physical Therapy Evaluation Patient Details Name: Nancy Mccormick MRN: [id number] DOB: Nov 06, 1933 Today's Date: 08/31/2017   History of Present Illness  Nancy Mccormick is an 83 y.o. female with no previous CVA or CAD who reports "I had afib once" who takes ASA daily, who had a HA today (all over) without NV or neuro changes. Apparently, sometime after waking up this afternoon (maybe around 15:30) noted by family to have trouble finding words. Was able to follow physical commands and could hear well.  Brought to ER.    Clinical Impression  Patient limited for functional mobility as stated below secondary to BLE weakness and  fatigue  Patient will benefit from continued physical therapy in hospital and recommended venue below to increase strength, balance, endurance for safe ADLs and gait.    Follow Up Recommendations Home health PT    Equipment Recommendations  None recommended by PT    Recommendations for Other Services       Precautions / Restrictions Precautions Precautions: Fall Restrictions Weight Bearing Restrictions: No      Mobility  Bed Mobility Overal bed mobility: Independent                Transfers Overall transfer level: Independent Equipment used: None                Ambulation/Gait Ambulation/Gait assistance: Supervision Ambulation Distance (Feet): 100 Feet Assistive device: None Gait Pattern/deviations: WFL(Within Functional Limits)   Gait velocity interpretation: at or above normal speed for age/gender General Gait Details: grossly WFL except occasional veering right and left without loss of balance  Stairs            Wheelchair Mobility    Modified Rankin (Stroke Patients Only)       Balance Overall balance assessment: No apparent balance deficits (not formally assessed)                                           Pertinent Vitals/Pain Pain Assessment: No/denies pain    Home Living Family/patient  expects to be discharged to:: Private residence Living Arrangements: Spouse/significant other Available Help at Discharge: Family Type of Home: House Home Access: Ramped entrance Entrance Stairs-Rails: None Entrance Stairs-Number of Steps: 1 step Home Layout: One level Home Equipment: Bedside commode;Hospital bed;Walker - 2 wheels;Cane - single point Additional Comments: Aide comes twice a week for 4 hours to assist with husband.    Prior Function Level of Independence: Independent               Hand Dominance        Extremity/Trunk Assessment   Upper Extremity Assessment Upper Extremity Assessment: Defer to OT evaluation    Lower Extremity Assessment Lower Extremity Assessment: Overall WFL for tasks assessed;RLE deficits/detail;LLE deficits/detail RLE Deficits / Details: grossly 4/5 LLE Deficits / Details: grossly 5/5       Communication   Communication: No difficulties  Cognition Arousal/Alertness: Awake/alert Behavior During Therapy: WFL for tasks assessed/performed Overall Cognitive Status: Within Functional Limits for tasks assessed                                        General Comments      Exercises     Assessment/Plan    PT Assessment Patient needs continued PT services  PT Problem  List Decreased strength;Decreased activity tolerance;Decreased balance;Decreased mobility       PT Treatment Interventions Gait training;Functional mobility training;Therapeutic activities;Therapeutic exercise;Patient/family education    PT Goals (Current goals can be found in the Care Plan section)  Acute Rehab PT Goals Patient Stated Goal: return home PT Goal Formulation: With patient Time For Goal Achievement: 09/02/17 Potential to Achieve Goals: Good    Frequency 7X/week   Barriers to discharge        Co-evaluation   Reason for Co-Treatment: To address functional/ADL transfers   OT goals addressed during session: ADL's and  self-care;Strengthening/ROM       AM-PAC PT "6 Clicks" Daily Activity  Outcome Measure Difficulty turning over in bed (including adjusting bedclothes, sheets and blankets)?: None Difficulty moving from lying on back to sitting on the side of the bed? : None Difficulty sitting down on and standing up from a chair with arms (e.g., wheelchair, bedside commode, etc,.)?: None Help needed moving to and from a bed to chair (including a wheelchair)?: None Help needed walking in hospital room?: A Little Help needed climbing 3-5 steps with a railing? : A Little 6 Click Score: 22    End of Session   Activity Tolerance: Patient tolerated treatment well Patient left: in chair;with call bell/phone within reach Nurse Communication: Mobility status PT Visit Diagnosis: Unsteadiness on feet (R26.81);Other abnormalities of gait and mobility (R26.89);Muscle weakness (generalized) (M62.81)    Time: 5329-9242 PT Time Calculation (min) (ACUTE ONLY): 22 min   Charges:   PT Evaluation $PT Eval Low Complexity: 1 Low PT Treatments $Therapeutic Activity: 8-22 mins   PT G Codes:   PT G-Codes **NOT FOR INPATIENT CLASS** Functional Assessment Tool Used: AM-PAC 6 Clicks Basic Mobility Functional Limitation: Mobility: Walking and moving around Mobility: Walking and Moving Around Current Status (A8341): At least 20 percent but less than 40 percent impaired, limited or restricted Mobility: Walking and Moving Around Goal Status [phone]): At least 20 percent but less than 40 percent impaired, limited or restricted Mobility: Walking and Moving Around Discharge Status [phone]): At least 20 percent but less than 40 percent impaired, limited or restricted    10:52 AM, 08/31/17 Lonell Grandchild, MPT Physical Therapist with [facility] [phone] office [phone] mobile phone

## 2017-08-31 NOTE — Care Management Note (Signed)
Case Management Note  Patient Details  Name: Nancy Mccormick MRN: [id number] Date of Birth: [birth year]  Subjective/Objective:       Admitted with CVA. Pt from home, lives with husband who requires total care. Family at bedside. Pt's husband has aid with him. Pt has cane, walker, BSC, hospital bed at home pta. She will need HH PT and pt has requested [facility]. Pt has PCP, transportation and insurance with drug coverage. Pt/family aware HH has 48 hrs to make first visit. Pt/family communicate no further DC needs or concerns about DC plan.         Action/Plan: DC home in next 24 hrs. Blake Divine, [facility] rep, given referral and will pull pt info from chart. CM will notify [facility] once pt discharges.   Expected Discharge Date:     09/01/2017             Expected Discharge Plan:  [place]  In-House Referral:  NA  Discharge planning Services     Post Acute Care Choice:  Home Health Choice offered to:  Patient  HH Arranged:  PT [place]:  [place]  Status of Service:  Completed, signed off  Sherald Barge, RN 08/31/2017, 1:58 PM

## 2017-08-31 NOTE — Evaluation (Signed)
Speech Language Pathology Evaluation Patient Details Name: Nancy Mccormick MRN: [id number] DOB: 09-13-1934 Today's Date: 08/31/2017 Time: 1430-1500 SLP Time Calculation (min) (ACUTE ONLY): 30 min  Problem List:  Patient Active Problem List   Diagnosis Date Noted  . CVA (cerebral vascular accident) ([place]) 08/30/2017  . Acute medial meniscal tear 07/03/2015  . Osteopenia 04/19/2014  . Palpitations 11/04/2013  . Arrhythmia 11/04/2013  . FIBRILLATION, ATRIAL 09/11/2010  . Dyslipidemia (high LDL; low HDL) 09/10/2010  . Essential hypertension 09/10/2010   Past Medical History:  Past Medical History:  Diagnosis Date  . Arthritis    back, left shoulder, and fingers  . Atrial fibrillation ([place])   . Hyperlipidemia   . Hypertension   . Osteoporosis    Past Surgical History:  Past Surgical History:  Procedure Laterality Date  . CATARACT EXTRACTION    . KNEE ARTHROSCOPY Right 07/04/2015   Procedure: RIGHT KNEE ARTHROSCOPY WITH MEDIAL MENISCAL DEBRIDEMENT AND CHONDROPLASTY;  Surgeon: Gaynelle Arabian, MD;  Location: [facility];  Service: Orthopedics;  Laterality: Right;  . OPEN REDUCTION INTERNAL FIXATION (ORIF) DISTAL RADIAL FRACTURE Right 12/28/2012   Procedure: OPEN REDUCTION INTERNAL FIXATION (ORIF) DISTAL RADIAL FRACTURE;  Surgeon: Hessie Dibble, MD;  Location: [facility];  Service: Orthopedics;  Laterality: Right;  Marland Kitchen VAGINAL HYSTERECTOMY     HPI:  [name] an 83 y.o.femalewith no previous CVA or CAD who reports "I had afib once" who takes ASA daily, who had a HA today (all over) without NV or neuro changes. Apparently, sometime after waking up this afternoon (maybe around 15:30) noted by family to have trouble finding words.She was admitted with acute CVA. MRI showed: Small acute cortical infarct in the left MCA watershed area-no hemorrhage or mass-effect seen on MRI study. Patient is on aspirin for antiplatelet therapy.   Assessment / Plan /  Recommendation Clinical Impression  Cognitive linguistic evaluation completed at bedside. Pt reports dysfluency and sound substitutions yesterday, which have improved, but not resolved today. Pt presents with mild cognitive linguistic deficits characterized by decreased attention in complex thought organization tasks and occasional sound substitutions in conversation and picture description task (normal content and naming). Pt is aware of errors and attempts to self correct. Pt is the primary caregiver for her husband at home and enjoys reading and writing (she has written two books). She is hopeful that cognitive linguistic skills will return to baseline. Recommend f/u SLP serivces in home health setting to address mild cognitive linguistic deficits. Prognosis for improvement is good given Pt motivation and progress thus far. SLP will sign off in acute setting and all SLP needs can be met at next venue of care (home health). Pt is in agreement with plan of care.    SLP Assessment  SLP Recommendation/Assessment: All further Speech Lanaguage Pathology  needs can be addressed in the next venue of care SLP Visit Diagnosis: Cognitive communication deficit (R41.841)    Follow Up Recommendations  Home health SLP    Frequency and Duration           SLP Evaluation Cognition  Overall Cognitive Status: Impaired/Different from baseline Arousal/Alertness: Awake/alert Orientation Level: Oriented X4 Attention: Alternating Alternating Attention: Impaired Alternating Attention Impairment: Verbal complex;Functional complex Memory: Appears intact Awareness: Appears intact Problem Solving: Appears intact Safety/Judgment: Appears intact       Comprehension  Auditory Comprehension Overall Auditory Comprehension: Impaired Yes/No Questions: Within Functional Limits Commands: Within Functional Limits Conversation: Complex Interfering Components: Attention;Processing speed EffectiveTechniques:  Repetition Visual Recognition/Discrimination Discrimination: Within Function  Limits Reading Comprehension Reading Status: Within funtional limits    Expression Expression Primary Mode of Expression: Verbal Verbal Expression Overall Verbal Expression: Appears within functional limits for tasks assessed Initiation: No impairment Automatic Speech: Name;Social Response;Counting;Month of year Level of Generative/Spontaneous Verbalization: Conversation Repetition: No impairment Naming: No impairment Pragmatics: No impairment Non-Verbal Means of Communication: Not applicable Written Expression Dominant Hand: Right Written Expression: Not tested   Oral / Motor  Motor Speech Overall Motor Speech: Impaired Respiration: Within functional limits Phonation: Normal Resonance: Within functional limits Articulation: Within functional limitis Intelligibility: Intelligible Motor Planning: Impaired Level of Impairment: Sentence Motor Speech Errors: Aware;Inconsistent Effective Techniques: Slow rate   Thank you,  Genene Churn, [place]                     Alyda Megna 08/31/2017, 4:34 PM

## 2017-08-31 NOTE — Consult Note (Addendum)
[name], MD     [email or web address]          Nancy Mccormick is an 83 y.o. female.   ASSESSMENT/PLAN: Cardioembolic stroke involving the left parietal region in the setting of atrial fibrillation:  The infarct is small and therefore the risk of hemorrhagic transformation is low.  The patient can be started on anticoagulation preferably Eliquis right away. No need for antiplatelet agents.   HTN:  Gradual blood pressure control is recommended.  Dyslipidemia:  The initiation of a statin is recommended.   The patient is a 18-year-old right-handed white female who presents with the acute onset of moderately severe word-finding difficulties on yesterday.  The symptoms were quite severe but have gradually improved in the last 24 hr.  She reports that she still seemed to have some difficulties but is improved significantly.  She reports having a whole cephalic headache when yesterday along with significant fatigue.  She denies focal weakness, numbness, dizziness, chest pain or shortness of breath.  It appears she has been diagnosed with atrial fibrillation during the workup.  The review of systems otherwise negative.   GENERAL:  This is a very pleasant female who is in no acute distress.  HEENT:  This is normal.  ABDOMEN: Soft  EXTREMITIES: No edema   BACK: Normal alignment.  SKIN: Normal by inspection.    MENTAL STATUS: Alert and oriented. Speech, language (including 5/5 naming of objects, comprehension, fluency and repetition) and cognition are generally intact. Judgment and insight normal.   CRANIAL NERVES: Pupils are equal, round and reactive to light and accommodation; extraocular movements are full, there is no significant nystagmus; upper and lower facial muscles are normal in strength and symmetric, there is no flattening of the nasolabial folds; tongue is midline; uvula is midline; shoulder elevation is normal.  MOTOR: Normal tone, bulk and  strength; no pronator drift.  COORDINATION: Left finger to nose is normal, right finger to nose is normal, No rest tremor; no intention tremor; no postural tremor; no bradykinesia.  REFLEXES: Deep tendon reflexes are symmetrical and normal.   SENSATION: Normal to light touch and temperature. No extinction.   NIH stroke scale 0.  The brain MRI scan is reviewed in person in shoes and year old increased signal on DWI involving the left parietal region medial aspect.  The increased signal is small in seen on two cuts.  The area involves the cortical and subcortical areas consistent with a small cortical infarct.  There is an adjacent area more laterally seen on one cut with less intensity.  There is mild confluent leukoencephalopathy seen on FLAIR imaging.  No hemorrhages appreciated.    Blood pressure 127/64, pulse 72, temperature 98.6 F (37 C), temperature source Oral, resp. rate 16, height 5' 3.5" (1.613 m), weight 186 lb 11.7 oz (84.7 kg), SpO2 98 %.  Past Medical History:  Diagnosis Date  . Arthritis    back, left shoulder, and fingers  . Atrial fibrillation ([place])   . Hyperlipidemia   . Hypertension   . Osteoporosis     Past Surgical History:  Procedure Laterality Date  . CATARACT EXTRACTION    . KNEE ARTHROSCOPY Right 07/04/2015   Procedure: RIGHT KNEE ARTHROSCOPY WITH MEDIAL MENISCAL DEBRIDEMENT AND CHONDROPLASTY;  Surgeon: Gaynelle Arabian, MD;  [facility];  Service: Orthopedics;  Laterality: Right;  . OPEN REDUCTION INTERNAL FIXATION (ORIF) DISTAL RADIAL FRACTURE Right 12/28/2012   Procedure: OPEN REDUCTION INTERNAL FIXATION (ORIF) DISTAL RADIAL FRACTURE;  Surgeon: Collier Salina  Autumn Patty, MD;  Location: [facility];  Service: Orthopedics;  Laterality: Right;  Marland Kitchen VAGINAL HYSTERECTOMY      Family History  Problem Relation Age of Onset  . Stroke Mother 72  . Anemia Mother   . Heart disease Mother   . Stroke Father 1  . Stroke Sister 28  . Hypertension Son      Social History:  reports that she quit smoking about 30 years ago. Her smoking use included cigarettes. she has never used smokeless tobacco. She reports that she does not drink alcohol or use drugs.  Allergies:  Allergies  Allergen Reactions  . Atorvastatin Other (See Comments)    myalgias   . Crestor [Rosuvastatin] Other (See Comments)    Myalgias   . Livalo [Pitavastatin] Other (See Comments)    myalgias  . Shellfish Allergy Itching, Swelling and Rash    Medications: Prior to Admission medications   Medication Sig Start Date End Date Taking? Authorizing Provider  acetaminophen (TYLENOL) 500 MG tablet Take 1,000 mg by mouth every 6 (six) hours as needed for mild pain or moderate pain.   Yes [provider]  aspirin 81 MG tablet Take 81 mg by mouth at bedtime.    Yes [provider]  albuterol (PROVENTIL HFA;VENTOLIN HFA) 108 (90 Base) MCG/ACT inhaler Inhale 2 puffs into the lungs every 6 (six) hours as needed for wheezing or shortness of breath. Patient not taking: Reported on 08/25/2017 10/06/16   Evelina Dun A, FNP  Calcium Carbonate-Vitamin D (CALCIUM + D PO) Take 1 tablet by mouth at bedtime.    [provider]  Cascara [place] 450 MG CAPS Take 2 capsules by mouth daily.    [provider]  Cholecalciferol (VITAMIN D3) 1000 UNITS tablet Take 2,000 Units by mouth at bedtime.     [provider]  diltiazem (CARDIZEM CD) 120 MG 24 hr capsule TAKE ONE (1) CAPSULE EACH DAY 07/01/17   Chipper Herb, MD  fluticasone furoate-vilanterol (BREO ELLIPTA) 100-25 MCG/INH AEPB Inhale 1 puff into the lungs daily. Patient not taking: Reported on 08/25/2017 10/21/16   Sharion Balloon, FNP  lisinopril-hydrochlorothiazide (PRINZIDE,ZESTORETIC) 10-12.5 MG tablet TAKE 1 TABLET DAILY AT BEDTIME 05/28/17   Chipper Herb, MD  Multiple Vitamin (MULTIVITAMIN WITH MINERALS) TABS tablet Take 1 tablet by mouth at bedtime.    [provider]   nitroGLYCERIN (NITROSTAT) 0.4 MG SL tablet Place 1 tablet (0.4 mg total) under the tongue as needed. 06/20/14   Chipper Herb, MD  omega-3 acid ethyl esters (LOVAZA) 1 g capsule TAKE ONE CAPSULE BY MOUTH TWICE A DAY 07/01/17   Chipper Herb, MD  potassium chloride (K-DUR) 10 MEQ tablet TAKE ONE TABLET DAILY AT BEDTIME 08/28/17   Chipper Herb, MD  Psyllium (NAT-RUL PSYLLIUM SEED HUSKS) 500 MG CAPS Take 2 capsules by mouth daily.    [provider]    Scheduled Meds: . aspirin EC  81 mg Oral Daily  . diltiazem  120 mg Oral Daily   Continuous Infusions: PRN Meds:.acetaminophen **OR** acetaminophen (TYLENOL) oral liquid 160 mg/5 mL **OR** acetaminophen, albuterol, HYDROcodone-acetaminophen     Results for orders placed or performed during the hospital encounter of 08/30/17 (from the past 48 hour(s))  Ethanol     Status: None   Collection Time: 08/30/17  7:15 PM  Result Value Ref Range   Alcohol, Ethyl (B) <10 <10 mg/dL    Comment:        LOWEST  DETECTABLE LIMIT FOR SERUM ALCOHOL IS 10 mg/dL FOR MEDICAL PURPOSES ONLY   Protime-INR     Status: None   Collection Time: 08/30/17  7:15 PM  Result Value Ref Range   Prothrombin Time 12.6 11.4 - 15.2 seconds   INR 0.95   APTT     Status: None   Collection Time: 08/30/17  7:15 PM  Result Value Ref Range   aPTT 28 24 - 36 seconds  CBC     Status: Abnormal   Collection Time: 08/30/17  7:15 PM  Result Value Ref Range   WBC 12.6 (H) 4.0 - 10.5 K/uL   RBC 4.76 3.87 - 5.11 MIL/uL   Hemoglobin 14.5 12.0 - 15.0 g/dL   HCT 44.9 36.0 - 46.0 %   MCV 94.3 78.0 - 100.0 fL   MCH 30.5 26.0 - 34.0 pg   MCHC 32.3 30.0 - 36.0 g/dL   RDW 13.8 11.5 - 15.5 %   Platelets 343 150 - 400 K/uL  Differential     Status: Abnormal   Collection Time: 08/30/17  7:15 PM  Result Value Ref Range   Neutrophils Relative % 61 %   Neutro Abs 7.6 1.7 - 7.7 K/uL   Lymphocytes Relative 28 %   Lymphs Abs 3.6 0.7 - 4.0 K/uL   Monocytes Relative 9 %    Monocytes Absolute 1.2 (H) 0.1 - 1.0 K/uL   Eosinophils Relative 2 %   Eosinophils Absolute 0.3 0.0 - 0.7 K/uL   Basophils Relative 0 %   Basophils Absolute 0.0 0.0 - 0.1 K/uL  Comprehensive metabolic panel     Status: Abnormal   Collection Time: 08/30/17  7:15 PM  Result Value Ref Range   Sodium 142 135 - 145 mmol/L   Potassium 3.8 3.5 - 5.1 mmol/L   Chloride 106 101 - 111 mmol/L   CO2 25 22 - 32 mmol/L   Glucose, Bld 99 65 - 99 mg/dL   BUN 22 (H) 6 - 20 mg/dL   Creatinine, Ser 0.92 0.44 - 1.00 mg/dL   Calcium 9.3 8.9 - 10.3 mg/dL   Total Protein 7.0 6.5 - 8.1 g/dL   Albumin 3.9 3.5 - 5.0 g/dL   AST 24 15 - 41 U/L   ALT 27 14 - 54 U/L   Alkaline Phosphatase 84 38 - 126 U/L   Total Bilirubin 1.1 0.3 - 1.2 mg/dL   GFR calc non Af Amer 57 (L) >60 mL/min   GFR calc Af Amer >60 >60 mL/min    Comment: (NOTE) The eGFR has been calculated using the CKD EPI equation. This calculation has not been validated in all clinical situations. eGFR's persistently <60 mL/min signify possible Chronic Kidney Disease.    Anion gap 11 5 - 15  Urine rapid drug screen (hosp performed)     Status: None   Collection Time: 08/30/17  7:41 PM  Result Value Ref Range   Opiates NONE DETECTED NONE DETECTED   Cocaine NONE DETECTED NONE DETECTED   Benzodiazepines NONE DETECTED NONE DETECTED   Amphetamines NONE DETECTED NONE DETECTED   Tetrahydrocannabinol NONE DETECTED NONE DETECTED   Barbiturates NONE DETECTED NONE DETECTED    Comment:        DRUG SCREEN FOR MEDICAL PURPOSES ONLY.  IF CONFIRMATION IS NEEDED FOR ANY PURPOSE, NOTIFY LAB WITHIN 5 DAYS.        LOWEST DETECTABLE LIMITS FOR URINE DRUG SCREEN Drug Class       Cutoff (ng/mL) Amphetamine  1000 Barbiturate      200 Benzodiazepine   373 Tricyclics       428 Opiates          300 Cocaine          300 THC              50   Urinalysis, Routine w reflex microscopic     Status: Abnormal   Collection Time: 08/30/17  7:41 PM  Result Value  Ref Range   Color, Urine YELLOW YELLOW   APPearance CLEAR CLEAR   Specific Gravity, Urine 1.014 1.005 - 1.030   pH 7.0 5.0 - 8.0   Glucose, UA NEGATIVE NEGATIVE mg/dL   Hgb urine dipstick NEGATIVE NEGATIVE   Bilirubin Urine NEGATIVE NEGATIVE   Ketones, ur 5 (A) NEGATIVE mg/dL   Protein, ur NEGATIVE NEGATIVE mg/dL   Nitrite NEGATIVE NEGATIVE   Leukocytes, UA MODERATE (A) NEGATIVE   RBC / HPF 6-30 0 - 5 RBC/hpf   WBC, UA 6-30 0 - 5 WBC/hpf   Bacteria, UA NONE SEEN NONE SEEN   Squamous Epithelial / LPF 0-5 (A) NONE SEEN   Mucus PRESENT   I-stat troponin, ED     Status: None   Collection Time: 08/30/17  8:07 PM  Result Value Ref Range   Troponin i, poc 0.00 0.00 - 0.08 ng/mL   Comment 3            Comment: Due to the release kinetics of cTnI, a negative result within the first hours of the onset of symptoms does not rule out myocardial infarction with certainty. If myocardial infarction is still suspected, repeat the test at appropriate intervals.   I-Stat Chem 8, ED     Status: Abnormal   Collection Time: 08/30/17  8:09 PM  Result Value Ref Range   Sodium 140 135 - 145 mmol/L   Potassium 4.0 3.5 - 5.1 mmol/L   Chloride 106 101 - 111 mmol/L   BUN 24 (H) 6 - 20 mg/dL   Creatinine, Ser 1.00 0.44 - 1.00 mg/dL   Glucose, Bld 96 65 - 99 mg/dL   Calcium, Ion 1.08 (L) 1.15 - 1.40 mmol/L   TCO2 27 22 - 32 mmol/L   Hemoglobin 13.6 12.0 - 15.0 g/dL   HCT 40.0 36.0 - 46.0 %  Lipid panel     Status: Abnormal   Collection Time: 08/31/17  6:03 AM  Result Value Ref Range   Cholesterol 256 (H) 0 - 200 mg/dL   Triglycerides 159 (H) <150 mg/dL   HDL 61 >40 mg/dL   Total CHOL/HDL Ratio 4.2 RATIO   VLDL 32 0 - 40 mg/dL   LDL Cholesterol 163 (H) 0 - 99 mg/dL    Comment:        Total Cholesterol/HDL:CHD Risk Coronary Heart Disease Risk Table                     Men   Women  1/2 Average Risk   3.4   3.3  Average Risk       5.0   4.4  2 X Average Risk   9.6   7.1  3 X Average Risk  23.4    11.0        Use the calculated Patient Ratio above and the CHD Risk Table to determine the patient's CHD Risk.        ATP III CLASSIFICATION (LDL):  <100     mg/dL   Optimal  100-129  mg/dL   Near or Above                    Optimal  130-159  mg/dL   Borderline  160-189  mg/dL   High  >190     mg/dL   Very High   CBC     Status: Abnormal   Collection Time: 08/31/17  6:03 AM  Result Value Ref Range   WBC 13.4 (H) 4.0 - 10.5 K/uL   RBC 4.58 3.87 - 5.11 MIL/uL   Hemoglobin 13.8 12.0 - 15.0 g/dL   HCT 43.2 36.0 - 46.0 %   MCV 94.3 78.0 - 100.0 fL   MCH 30.1 26.0 - 34.0 pg   MCHC 31.9 30.0 - 36.0 g/dL   RDW 13.9 11.5 - 15.5 %   Platelets 353 150 - 400 K/uL  Comprehensive metabolic panel     Status: Abnormal   Collection Time: 08/31/17  6:03 AM  Result Value Ref Range   Sodium 138 135 - 145 mmol/L   Potassium 3.4 (L) 3.5 - 5.1 mmol/L   Chloride 103 101 - 111 mmol/L   CO2 24 22 - 32 mmol/L   Glucose, Bld 107 (H) 65 - 99 mg/dL   BUN 16 6 - 20 mg/dL   Creatinine, Ser 0.65 0.44 - 1.00 mg/dL   Calcium 8.9 8.9 - 10.3 mg/dL   Total Protein 6.6 6.5 - 8.1 g/dL   Albumin 3.6 3.5 - 5.0 g/dL   AST 19 15 - 41 U/L   ALT 22 14 - 54 U/L   Alkaline Phosphatase 72 38 - 126 U/L   Total Bilirubin 1.3 (H) 0.3 - 1.2 mg/dL   GFR calc non Af Amer >60 >60 mL/min   GFR calc Af Amer >60 >60 mL/min    Comment: (NOTE) The eGFR has been calculated using the CKD EPI equation. This calculation has not been validated in all clinical situations. eGFR's persistently <60 mL/min signify possible Chronic Kidney Disease.    Anion gap 11 5 - 15    Studies/Results:  TTE Study Conclusions  - Left ventricle: The cavity size was normal. Wall thickness was   normal. Systolic function was normal. The estimated ejection   fraction was in the range of 55% to 60%. - Aortic valve: There was trivial regurgitation. - Right ventricle: The cavity size was normal. Wall thickness was   mildly  increased.     HEAD NECK CTA FINDINGS: CTA NECK FINDINGS  Aortic arch: Standard branching. Imaged portion shows no evidence of aneurysm or dissection. No significant stenosis of the major arch vessel origins. Mild mixed plaque of the arch.  Right carotid system: No evidence of dissection, stenosis (50% or greater) or occlusion. Mild calcific atherosclerosis of carotid bifurcation.  Left carotid system: No evidence of dissection, stenosis (50% or greater) or occlusion. Mild calcific atherosclerosis of carotid bifurcation.  Vertebral arteries: Codominant. No evidence of dissection, stenosis (50% or greater) or occlusion.  Skeleton: Large torus maxillaris. Mild cervical spondylosis with C6-7 loss of disc space height and endplate discogenic degenerative changes. No significant canal stenosis.  Other neck: Subcentimeter nodules within bilateral thyroid lobes.  Upper chest: Partially visualize left superior hilar lymphadenopathy.  Review of the MIP images confirms the above findings  CTA HEAD FINDINGS  Anterior circulation: No significant stenosis, proximal occlusion, aneurysm, or vascular malformation. Non stenotic calcific atherosclerosis of carotid siphons.  Posterior circulation: No significant stenosis, proximal occlusion, aneurysm, or vascular malformation. Short segment  of mild stenosis of right vertebral artery just proximal to vertebrobasilar junction. Fenestrated proximal basilar artery.  Venous sinuses: As permitted by contrast timing, patent.  Anatomic variants: No definite anterior or posterior communicating artery identified, likely hypoplastic or absent.  Review of the MIP images confirms the above findings  IMPRESSION: 1. Patent carotid and vertebral arteries. No dissection, aneurysm, or hemodynamically significant stenosis utilizing NASCET criteria. 2. Patent circle of Willis. No large vessel occlusion, aneurysm, or significant  stenosis.  3. Mild calcific atherosclerosis of aortic arch, carotid bifurcations, and carotid siphons.      BRAIN MRI FINDINGS: Brain: Cerebral volume is within normal limits for age.  There is a small 10 mm area of restricted diffusion in the posterior left parietal cortex near midline on series 3, image 92. Mild T2 and FLAIR hyperintensity with no hemorrhage or mass effect.  No other restricted diffusion. There is patchy bilateral cerebral white matter T2 and FLAIR hyperintensity, but more so in the right hemisphere. No chronic cortical encephalomalacia identified. Questionable small chronic microhemorrhage in the inferior left basal ganglia. Incidental perivascular space in the inferior right basal ganglia. Other bilateral basal ganglia T2 heterogeneity may represent a combination of perivascular spaces and small chronic lacunar infarcts. Negative brainstem and cerebellum.  No midline shift, mass effect, evidence of mass lesion, ventriculomegaly, extra-axial collection or acute intracranial hemorrhage. Cervicomedullary junction and pituitary are within normal limits.  Vascular: Major intracranial vascular flow voids are preserved.  Skull and upper cervical spine: Negative. Visualized bone marrow signal is within normal limits.  Sinuses/Orbits: Normal orbits soft tissues aside from postoperative changes to both globes. Visualized paranasal sinuses and mastoids are stable and well pneumatized.  Other: Visible internal auditory structures appear normal. Negative scalp and orbits soft tissues.  IMPRESSION: 1. Small acute cortical infarct in the posterior left MCA or left MCA/ACA watershed area. No hemorrhage or mass effect. 2. No other acute intracranial abnormality. Relatively mild for age signal changes elsewhere suggestive of chronic small vessel disease.         Sherel Fennell A. Merlene Laughter, M.D.  Diplomate, Tax adviser of Psychiatry and Neurology (  Neurology). 08/31/2017, 6:45 PM

## 2017-09-01 ENCOUNTER — Telehealth: Payer: Self-pay | Admitting: Family Medicine

## 2017-09-01 DIAGNOSIS — I4891 Unspecified atrial fibrillation: Secondary | ICD-10-CM

## 2017-09-01 DIAGNOSIS — Z789 Other specified health status: Secondary | ICD-10-CM | POA: Diagnosis present

## 2017-09-01 DIAGNOSIS — I6340 Cerebral infarction due to embolism of unspecified cerebral artery: Secondary | ICD-10-CM

## 2017-09-01 DIAGNOSIS — I10 Essential (primary) hypertension: Secondary | ICD-10-CM

## 2017-09-01 LAB — CBC
HEMATOCRIT: 43 % (ref 36.0–46.0)
HEMOGLOBIN: 13.6 g/dL (ref 12.0–15.0)
MCH: 29.8 pg (ref 26.0–34.0)
MCHC: 31.6 g/dL (ref 30.0–36.0)
MCV: 94.3 fL (ref 78.0–100.0)
Platelets: 328 10*3/uL (ref 150–400)
RBC: 4.56 MIL/uL (ref 3.87–5.11)
RDW: 13.9 % (ref 11.5–15.5)
WBC: 10.4 10*3/uL (ref 4.0–10.5)

## 2017-09-01 LAB — COMPREHENSIVE METABOLIC PANEL
ALK PHOS: 65 U/L (ref 38–126)
ALT: 20 U/L (ref 14–54)
AST: 19 U/L (ref 15–41)
Albumin: 3.6 g/dL (ref 3.5–5.0)
Anion gap: 10 (ref 5–15)
BUN: 18 mg/dL (ref 6–20)
CALCIUM: 9 mg/dL (ref 8.9–10.3)
CO2: 25 mmol/L (ref 22–32)
CREATININE: 0.64 mg/dL (ref 0.44–1.00)
Chloride: 105 mmol/L (ref 101–111)
Glucose, Bld: 104 mg/dL — ABNORMAL HIGH (ref 65–99)
Potassium: 3.4 mmol/L — ABNORMAL LOW (ref 3.5–5.1)
Sodium: 140 mmol/L (ref 135–145)
Total Bilirubin: 1.4 mg/dL — ABNORMAL HIGH (ref 0.3–1.2)
Total Protein: 6.5 g/dL (ref 6.5–8.1)

## 2017-09-01 LAB — HEMOGLOBIN A1C
Hgb A1c MFr Bld: 5.9 % — ABNORMAL HIGH (ref 4.8–5.6)
Mean Plasma Glucose: 123 mg/dL

## 2017-09-01 MED ORDER — APIXABAN 5 MG PO TABS
5.0000 mg | ORAL_TABLET | Freq: Two times a day (BID) | ORAL | Status: DC
  Administered 2017-09-01: 5 mg via ORAL
  Filled 2017-09-01: qty 1

## 2017-09-01 MED ORDER — DILTIAZEM HCL ER COATED BEADS 180 MG PO CP24: 180.0000 mg | ORAL_CAPSULE | Freq: Every day | ORAL | 0 refills | Status: DC

## 2017-09-01 MED ORDER — POTASSIUM CHLORIDE CRYS ER 20 MEQ PO TBCR
40.0000 meq | EXTENDED_RELEASE_TABLET | Freq: Once | ORAL | Status: AC
  Administered 2017-09-01: 40 meq via ORAL
  Filled 2017-09-01: qty 2

## 2017-09-01 MED ORDER — APIXABAN 5 MG PO TABS: 5.0000 mg | ORAL_TABLET | Freq: Two times a day (BID) | ORAL | 0 refills | Status: DC

## 2017-09-01 NOTE — Progress Notes (Signed)
IV removed, patient tolerated well, gauze and paper tape applied to site. Discharge information reviewed with patient and patient verbalized understanding. Patient's son coming to transport patient home.

## 2017-09-01 NOTE — Discharge Summary (Signed)
Physician Discharge Summary  Nancy Mccormick WYO:[id number] DOB: July 21, 1934 DOA: 08/30/2017  PCP: Chipper Herb, MD Cardiologist: Hoch[place]  Admit date: 08/30/2017 Discharge date: 09/01/2017  Admitted From: Home  Disposition: Home   Recommendations for Outpatient Follow-up:  1. Follow up with PCP in 1 weeks 2. Follow up with cardiology in 2 weeks 3. Follow up with neurologist in 4-6 weeks.  4. Discuss restarting a STATIN on outpatient follow up  5. Please obtain BMP/CBC in one week  Home Health: PT, SLP  Discharge Condition: STABLE   CODE STATUS: FULL    Brief Hospitalization Summary: Please see all hospital notes, images, labs for full details of the hospitalization.  History of Present Illness:   Nancy Mccormick is an 83 y.o. female with no previous CVA or CAD who reports "I had afib once" who takes ASA daily, who had a HA today (all over) without NV or neuro changes. Apparently, sometime after waking up this afternoon (maybe around 15:30) noted by family to have trouble finding words. Was able to follow physical commands and could hear well.  Brought to ER.  In ER, stroke evaluation done.  Currently pending swallow (so that she can take an ASA, which she usually takes at night).  But otherwise, just complains of HA all over. She notes her trouble with word finding, but son  (who saw her earlier in the day) notes that her speech is a little better.  Denies: Fevers, chills, nausea, emesis, rash, chest pain, shortness of breath, pain with respiration, headache, abdominal pain, skin lesions or rashes, GU discharge, bowel or bladder dysfunction, dysuria, hallucinations, visual changes, taste changes, musculoskeletal swelling or deformity, bleeding problems, or allergy symptoms.  Brief Admission Hx: Nancy Mccormick an 83 y.o.femalewith no previous CVA or CAD who reports "I had afib once" who takes ASA daily, who had a HA today (all over) without NV or neuro changes.  Apparently, sometime after waking up this afternoon (maybe around 15:30) noted by family to have trouble finding words.  She was admitted with acute CVA.   MDM/Assessment & Plan:   1. Small acute cortical infarct in the left MCA watershed area-no hemorrhage or mass-effect seen on MRI study.  Neurology recommended starting patient on eliquis.  Pharm consulted and started 5 mg BID.  PT recommended HHPT and SLP recommended [place] SLP.    2. Dyslipidemia-patient reports an intolerance to statins.  She reports that she has tried multiple statins and was not able to tolerate any of them.  Fortunately her LDL is 163 which is suboptimally controlled.  Pt to follow up outpatient with cardiology and PCP to discuss restarting a STATIN.   3. Paroxysmal atrial fibrillation-heart rate controlled with diltiazem but increases with ambulation, increasing dose to 180 mg daily.  Follow up with cardiologist outpatient in 2 weeks. 4. Essential hypertension-stable and controlled.  CTA Neck: MPRESSION: 1. Patent carotid and vertebral arteries. No dissection, aneurysm, or hemodynamically significant stenosis utilizing NASCET criteria. 2. Patent circle of Willis. No large vessel occlusion, aneurysm, or significant stenosis. 3. Mild calcific atherosclerosis of aortic arch, carotid bifurcations, and carotid siphons.   Echocardiogram:   Study Conclusions - Left ventricle: The cavity size was normal. Wall thickness was  normal. Systolic function was normal. The estimated ejection  fraction was in the range of 55% to 60%. - Aortic valve: There was trivial regurgitation. - Right ventricle: The cavity size was normal. Wall thickness was mildly increased.  From Neurology: ASSESSMENT/PLAN: Cardioembolic stroke involving the left parietal region  in the setting of atrial fibrillation:  The infarct is small and therefore the risk of hemorrhagic transformation is low.  The patient can be started on anticoagulation preferably Eliquis  right away. No need for antiplatelet agents.   HTN:  Gradual blood pressure control is recommended.  Dyslipidemia:  The initiation of a statin is recommended. [place] A. Merlene Laughter, MD      [email or web address]  DVT prophylaxis: SCDs Code Status: Full Family Communication: Bedside Disposition Plan: Home with home health.   Consultants:  neurology  Discharge Diagnoses:  Principal Problem:   CVA (cerebral vascular accident) ([place]) Active Problems:   Dyslipidemia (high LDL; low HDL)   Essential hypertension   FIBRILLATION, ATRIAL   Arrhythmia  Discharge Instructions: Discharge Instructions    Call MD for:  extreme fatigue   Complete by:  As directed    Call MD for:  persistant dizziness or light-headedness   Complete by:  As directed    Diet - low sodium heart healthy   Complete by:  As directed    Increase activity slowly   Complete by:  As directed      Allergies as of 09/01/2017      Reactions   Atorvastatin Other (See Comments)   myalgias   Crestor [rosuvastatin] Other (See Comments)   Myalgias   Livalo [pitavastatin] Other (See Comments)   myalgias   Shellfish Allergy Itching, Swelling, Rash      Medication List    STOP taking these medications   aspirin 81 MG tablet   Cascara [place] 450 MG Caps   lisinopril-hydrochlorothiazide 10-12.5 MG tablet Commonly known as:  PRINZIDE,ZESTORETIC   potassium chloride 10 MEQ tablet Commonly known as:  K-DUR     TAKE these medications   acetaminophen 500 MG tablet Commonly known as:  TYLENOL Take 1,000 mg by mouth every 6 (six) hours as needed for mild pain or moderate pain.   apixaban 5 MG Tabs tablet Commonly known as:  ELIQUIS Take 1 tablet (5 mg total) by mouth 2 (two) times daily.   CALCIUM + D PO Take 1 tablet by mouth at bedtime.   cholecalciferol 1000 units tablet Commonly known as:  VITAMIN D Take 2,000 Units by mouth at bedtime.   diltiazem 180 MG 24 hr  capsule Commonly known as:  CARDIZEM CD Take 1 capsule (180 mg total) by mouth daily. What changed:    medication strength  See the new instructions.   multivitamin with minerals Tabs tablet Take 1 tablet by mouth at bedtime.   NAT-RUL PSYLLIUM SEED HUSKS 500 MG Caps Generic drug:  Psyllium Take 2 capsules by mouth daily.   nitroGLYCERIN 0.4 MG SL tablet Commonly known as:  NITROSTAT Place 1 tablet (0.4 mg total) under the tongue as needed.   omega-3 acid ethyl esters 1 g capsule Commonly known as:  LOVAZA TAKE ONE CAPSULE BY MOUTH TWICE A DAY      Follow-up Information    Chipper Herb, MD. Schedule an appointment as soon as possible for a visit in 1 week(s).   Specialty:  Family Medicine Contact information: [place] [address] [phone]        Minus Breeding, MD. Schedule an appointment as soon as possible for a visit in 2 week(s).   Specialty:  Cardiology Contact information: [place] [address] [phone]        Phillips Odor, MD. Schedule an appointment as soon as possible for a visit in  4 week(s).   Specialty:  Neurology Contact information: [address] [place] [address] [phone]          Allergies  Allergen Reactions  . Atorvastatin Other (See Comments)    myalgias   . Crestor [Rosuvastatin] Other (See Comments)    Myalgias   . Livalo [Pitavastatin] Other (See Comments)    myalgias  . Shellfish Allergy Itching, Swelling and Rash   Current Discharge Medication List    START taking these medications   Details  apixaban (ELIQUIS) 5 MG TABS tablet Take 1 tablet (5 mg total) by mouth 2 (two) times daily. Qty: 60 tablet, Refills: 0      CONTINUE these medications which have CHANGED   Details  diltiazem (CARDIZEM CD) 180 MG 24 hr capsule Take 1 capsule (180 mg total) by mouth daily. Qty: 30 capsule, Refills: 0      CONTINUE these medications which have NOT CHANGED   Details   acetaminophen (TYLENOL) 500 MG tablet Take 1,000 mg by mouth every 6 (six) hours as needed for mild pain or moderate pain.    Calcium Carbonate-Vitamin D (CALCIUM + D PO) Take 1 tablet by mouth at bedtime.    Cholecalciferol (VITAMIN D3) 1000 UNITS tablet Take 2,000 Units by mouth at bedtime.     Multiple Vitamin (MULTIVITAMIN WITH MINERALS) TABS tablet Take 1 tablet by mouth at bedtime.    nitroGLYCERIN (NITROSTAT) 0.4 MG SL tablet Place 1 tablet (0.4 mg total) under the tongue as needed. Qty: 25 tablet, Refills: 11    omega-3 acid ethyl esters (LOVAZA) 1 g capsule TAKE ONE CAPSULE BY MOUTH TWICE A DAY Qty: 120 capsule, Refills: 0    Psyllium (NAT-RUL PSYLLIUM SEED HUSKS) 500 MG CAPS Take 2 capsules by mouth daily.      STOP taking these medications     aspirin 81 MG tablet      Cascara [place] 450 MG CAPS      lisinopril-hydrochlorothiazide (PRINZIDE,ZESTORETIC) 10-12.5 MG tablet      potassium chloride (K-DUR) 10 MEQ tablet      albuterol (PROVENTIL HFA;VENTOLIN HFA) 108 (90 Base) MCG/ACT inhaler         Procedures/Studies: Ct Angio Head W Or Wo Contrast  Result Date: 08/30/2017 CLINICAL DATA:  83 y/o F; code stroke patient with weakness and slurred speech. History of motor neuron disease. EXAM: CT ANGIOGRAPHY HEAD AND NECK TECHNIQUE: Multidetector CT imaging of the head and neck was performed using the standard protocol during bolus administration of intravenous contrast. Multiplanar CT image reconstructions and MIPs were obtained to evaluate the vascular anatomy. Carotid stenosis measurements (when applicable) are obtained utilizing NASCET criteria, using the distal internal carotid diameter as the denominator. CONTRAST:  126mL ISOVUE-370 IOPAMIDOL (ISOVUE-370) INJECTION 76% COMPARISON:  08/30/2017 CT of the head. FINDINGS: CTA NECK FINDINGS Aortic arch: Standard branching. Imaged portion shows no evidence of aneurysm or dissection. No significant stenosis of the major arch  vessel origins. Mild mixed plaque of the arch. Right carotid system: No evidence of dissection, stenosis (50% or greater) or occlusion. Mild calcific atherosclerosis of carotid bifurcation. Left carotid system: No evidence of dissection, stenosis (50% or greater) or occlusion. Mild calcific atherosclerosis of carotid bifurcation. Vertebral arteries: Codominant. No evidence of dissection, stenosis (50% or greater) or occlusion. Skeleton: Large torus maxillaris. Mild cervical spondylosis with C6-7 loss of disc space height and endplate discogenic degenerative changes. No significant canal stenosis. Other neck: Subcentimeter nodules within bilateral thyroid lobes. Upper chest: Partially visualize left  superior hilar lymphadenopathy. Review of the MIP images confirms the above findings CTA HEAD FINDINGS Anterior circulation: No significant stenosis, proximal occlusion, aneurysm, or vascular malformation. Non stenotic calcific atherosclerosis of carotid siphons. Posterior circulation: No significant stenosis, proximal occlusion, aneurysm, or vascular malformation. Short segment of mild stenosis of right vertebral artery just proximal to vertebrobasilar junction. Fenestrated proximal basilar artery. Venous sinuses: As permitted by contrast timing, patent. Anatomic variants: No definite anterior or posterior communicating artery identified, likely hypoplastic or absent. Review of the MIP images confirms the above findings IMPRESSION: 1. Patent carotid and vertebral arteries. No dissection, aneurysm, or hemodynamically significant stenosis utilizing NASCET criteria. 2. Patent circle of Willis. No large vessel occlusion, aneurysm, or significant stenosis. 3. Mild calcific atherosclerosis of aortic arch, carotid bifurcations, and carotid siphons. These results were called by telephone at the time of interpretation on 08/30/2017 at 8:12 pm to Dr. Arne Cleveland , who verbally acknowledged these results. Electronically Signed    By: Kristine Garbe M.D.   On: 08/30/2017 20:13   Ct Angio Neck W Or Wo Contrast  Result Date: 08/30/2017 CLINICAL DATA:  83 y/o F; code stroke patient with weakness and slurred speech. History of motor neuron disease. EXAM: CT ANGIOGRAPHY HEAD AND NECK TECHNIQUE: Multidetector CT imaging of the head and neck was performed using the standard protocol during bolus administration of intravenous contrast. Multiplanar CT image reconstructions and MIPs were obtained to evaluate the vascular anatomy. Carotid stenosis measurements (when applicable) are obtained utilizing NASCET criteria, using the distal internal carotid diameter as the denominator. CONTRAST:  139mL ISOVUE-370 IOPAMIDOL (ISOVUE-370) INJECTION 76% COMPARISON:  08/30/2017 CT of the head. FINDINGS: CTA NECK FINDINGS Aortic arch: Standard branching. Imaged portion shows no evidence of aneurysm or dissection. No significant stenosis of the major arch vessel origins. Mild mixed plaque of the arch. Right carotid system: No evidence of dissection, stenosis (50% or greater) or occlusion. Mild calcific atherosclerosis of carotid bifurcation. Left carotid system: No evidence of dissection, stenosis (50% or greater) or occlusion. Mild calcific atherosclerosis of carotid bifurcation. Vertebral arteries: Codominant. No evidence of dissection, stenosis (50% or greater) or occlusion. Skeleton: Large torus maxillaris. Mild cervical spondylosis with C6-7 loss of disc space height and endplate discogenic degenerative changes. No significant canal stenosis. Other neck: Subcentimeter nodules within bilateral thyroid lobes. Upper chest: Partially visualize left superior hilar lymphadenopathy. Review of the MIP images confirms the above findings CTA HEAD FINDINGS Anterior circulation: No significant stenosis, proximal occlusion, aneurysm, or vascular malformation. Non stenotic calcific atherosclerosis of carotid siphons. Posterior circulation: No significant  stenosis, proximal occlusion, aneurysm, or vascular malformation. Short segment of mild stenosis of right vertebral artery just proximal to vertebrobasilar junction. Fenestrated proximal basilar artery. Venous sinuses: As permitted by contrast timing, patent. Anatomic variants: No definite anterior or posterior communicating artery identified, likely hypoplastic or absent. Review of the MIP images confirms the above findings IMPRESSION: 1. Patent carotid and vertebral arteries. No dissection, aneurysm, or hemodynamically significant stenosis utilizing NASCET criteria. 2. Patent circle of Willis. No large vessel occlusion, aneurysm, or significant stenosis. 3. Mild calcific atherosclerosis of aortic arch, carotid bifurcations, and carotid siphons. These results were called by telephone at the time of interpretation on 08/30/2017 at 8:12 pm to Dr. Arne Cleveland , who verbally acknowledged these results. Electronically Signed   By: Kristine Garbe M.D.   On: 08/30/2017 20:13   Mr Brain Wo Contrast  Result Date: 08/31/2017 CLINICAL DATA:  83 year old female with weakness and slurred speech for 2 days.  EXAM: MRI HEAD WITHOUT CONTRAST TECHNIQUE: Multiplanar, multiecho pulse sequences of the brain and surrounding structures were obtained without intravenous contrast. COMPARISON:  Head CT and CTA head and neck 08/30/2017. FINDINGS: Brain: Cerebral volume is within normal limits for age. There is a small 10 mm area of restricted diffusion in the posterior left parietal cortex near midline on series 3, image 92. Mild T2 and FLAIR hyperintensity with no hemorrhage or mass effect. No other restricted diffusion. There is patchy bilateral cerebral white matter T2 and FLAIR hyperintensity, but more so in the right hemisphere. No chronic cortical encephalomalacia identified. Questionable small chronic microhemorrhage in the inferior left basal ganglia. Incidental perivascular space in the inferior right basal ganglia.  Other bilateral basal ganglia T2 heterogeneity may represent a combination of perivascular spaces and small chronic lacunar infarcts. Negative brainstem and cerebellum. No midline shift, mass effect, evidence of mass lesion, ventriculomegaly, extra-axial collection or acute intracranial hemorrhage. Cervicomedullary junction and pituitary are within normal limits. Vascular: Major intracranial vascular flow voids are preserved. Skull and upper cervical spine: Negative. Visualized bone marrow signal is within normal limits. Sinuses/Orbits: Normal orbits soft tissues aside from postoperative changes to both globes. Visualized paranasal sinuses and mastoids are stable and well pneumatized. Other: Visible internal auditory structures appear normal. Negative scalp and orbits soft tissues. IMPRESSION: 1. Small acute cortical infarct in the posterior left MCA or left MCA/ACA watershed area. No hemorrhage or mass effect. 2. No other acute intracranial abnormality. Relatively mild for age signal changes elsewhere suggestive of chronic small vessel disease. Electronically Signed   By: Genevie Ann M.D.   On: 08/31/2017 09:56   Ct Head Code Stroke Wo Contrast  Result Date: 08/30/2017 CLINICAL DATA:  Code stroke. 83 y/o F; history of motor neuron disease presenting with weakness and slurred speech. EXAM: CT HEAD WITHOUT CONTRAST TECHNIQUE: Contiguous axial images were obtained from the base of the skull through the vertex without intravenous contrast. COMPARISON:  None. FINDINGS: Brain: No evidence of acute infarction, hemorrhage, hydrocephalus, extra-axial collection or mass lesion/mass effect. Mild chronic microvascular ischemic changes and parenchymal volume loss of the brain. Lucency in right putamen may represent a prominent perivascular space or chronic lacunar infarction. Vascular: Calcific atherosclerosis of carotid siphons. Dense vessel in left anterior sylvian fissure may represent M2 thrombosis (series 2, image 12).  Skull: Normal. Negative for fracture or focal lesion. Sinuses/Orbits: No acute finding. Other: Bilateral intra-ocular lens replacement. ASPECTS [facility] Stroke Program Early CT Score) - Ganglionic level infarction (caudate, lentiform nuclei, internal capsule, insula, M1-M3 cortex): 7 - Supraganglionic infarction (M4-M6 cortex): 3 Total score (0-10 with 10 being normal): 10 IMPRESSION: 1. No acute intracranial abnormality identified. 2. ASPECTS is 10 3. Dense vessel in left anterior sylvian fissure may represent M2 thrombosis. These results were called by telephone at the time of interpretation on 08/30/2017 at 7:34 pm to Dr. Orlie Dakin , who verbally acknowledged these results. Electronically Signed   By: Kristine Garbe M.D.   On: 08/30/2017 19:35     Subjective: Pt says that she feels weak at times but overall has been much better.    Discharge Exam: Vitals:   09/01/17 0600 09/01/17 0850  BP: 121/64 (!) 132/47  Pulse: 69 (!) 86  Resp: 16 18  Temp: 98.1 F (36.7 C) (!) 97.5 F (36.4 C)  SpO2: 94% 93%     General: Pt is alert, awake, not in acute distress Cardiovascular: S1/S2 +, no rubs, no gallops Respiratory: CTA bilaterally, no wheezing, no rhonchi  Abdominal: Soft, NT, ND, bowel sounds + Extremities: no edema, no cyanosis   The results of significant diagnostics from this hospitalization (including imaging, microbiology, ancillary and laboratory) are listed below for reference.     Microbiology: No results found for this or any previous visit (from the past 240 hour(s)).   Labs: BNP (last 3 results) No results for input(s): BNP in the last 8760 hours. Basic Metabolic Panel: Recent Labs  Lab 08/30/17 1915 08/30/17 2009 08/31/17 0603 09/01/17 0554  NA 142 140 138 140  K 3.8 4.0 3.4* 3.4*  CL 106 106 103 105  CO2 25  --  24 25  GLUCOSE 99 96 107* 104*  BUN 22* 24* 16 18  CREATININE 0.92 1.00 0.65 0.64  CALCIUM 9.3  --  8.9 9.0   Liver Function  Tests: Recent Labs  Lab 08/30/17 1915 08/31/17 0603 09/01/17 0554  AST 24 19 19   ALT 27 22 20   ALKPHOS 84 72 65  BILITOT 1.1 1.3* 1.4*  PROT 7.0 6.6 6.5  ALBUMIN 3.9 3.6 3.6   No results for input(s): LIPASE, AMYLASE in the last 168 hours. No results for input(s): AMMONIA in the last 168 hours. CBC: Recent Labs  Lab 08/30/17 1915 08/30/17 2009 08/31/17 0603 09/01/17 0554  WBC 12.6*  --  13.4* 10.4  NEUTROABS 7.6  --   --   --   HGB 14.5 13.6 13.8 13.6  HCT 44.9 40.0 43.2 43.0  MCV 94.3  --  94.3 94.3  PLT 343  --  353 328   Cardiac Enzymes: No results for input(s): CKTOTAL, CKMB, CKMBINDEX, TROPONINI in the last 168 hours. BNP: Invalid input(s): POCBNP CBG: No results for input(s): GLUCAP in the last 168 hours. D-Dimer No results for input(s): DDIMER in the last 72 hours. Hgb A1c Recent Labs    08/31/17 0603  HGBA1C 5.9*   Lipid Profile Recent Labs    08/31/17 0603  CHOL 256*  HDL 61  LDLCALC 163*  TRIG 159*  CHOLHDL 4.2   Thyroid function studies No results for input(s): TSH, T4TOTAL, T3FREE, THYROIDAB in the last 72 hours.  Invalid input(s): FREET3 Anemia work up No results for input(s): VITAMINB12, FOLATE, FERRITIN, TIBC, IRON, RETICCTPCT in the last 72 hours. Urinalysis    Component Value Date/Time   COLORURINE YELLOW 08/30/2017 1941   APPEARANCEUR CLEAR 08/30/2017 1941   LABSPEC 1.014 08/30/2017 1941   PHURINE 7.0 08/30/2017 1941   GLUCOSEU NEGATIVE 08/30/2017 1941   HGBUR NEGATIVE 08/30/2017 [place] 08/30/2017 1941   BILIRUBINUR neg 09/19/2014 1129   KETONESUR 5 (A) 08/30/2017 1941   PROTEINUR NEGATIVE 08/30/2017 1941   UROBILINOGEN negative 09/19/2014 1129   UROBILINOGEN 0.2 08/13/2010 1023   NITRITE NEGATIVE 08/30/2017 1941   LEUKOCYTESUR MODERATE (A) 08/30/2017 1941   Sepsis Labs Invalid input(s): PROCALCITONIN,  WBC,  LACTICIDVEN Microbiology No results found for this or any previous visit (from the past 240  hour(s)).  Time coordinating discharge: 37 mins  SIGNED:  Irwin Brakeman, MD  [facility] 09/01/2017, 11:59 AM Pager [phone]  If 7PM-7AM, please contact night-coverage [email or web address] Password [email or web address]

## 2017-09-01 NOTE — Discharge Instructions (Signed)
Follow with Primary MD  Moore, Donald W, MD  and other consultant's as instructed your Hospitalist MD ° °Please get a complete blood count and chemistry panel checked by your Primary MD at your next visit, and again as instructed by your Primary MD. ° °Get Medicines reviewed and adjusted: °Please take all your medications with you for your next visit with your Primary MD ° °Laboratory/radiological data: °Please request your Primary MD to go over all hospital tests and procedure/radiological results at the follow up, please ask your Primary MD to get all Hospital records sent to his/her office. ° °In some cases, they will be blood work, cultures and biopsy results pending at the time of your discharge. Please request that your primary care M.D. follows up on these results. ° °Also Note the following: °If you experience worsening of your admission symptoms, develop shortness of breath, life threatening emergency, suicidal or homicidal thoughts you must seek medical attention immediately by calling 911 or calling your MD immediately  if symptoms less severe. ° °You must read complete instructions/literature along with all the possible adverse reactions/side effects for all the Medicines you take and that have been prescribed to you. Take any new Medicines after you have completely understood and accpet all the possible adverse reactions/side effects.  ° °Do not drive when taking Pain medications or sleeping medications (Benzodaizepines) ° °Do not take more than prescribed Pain, Sleep and Anxiety Medications. It is not advisable to combine anxiety,sleep and pain medications without talking with your primary care practitioner ° °Special Instructions: If you have smoked or chewed Tobacco  in the last 2 yrs please stop smoking, stop any regular Alcohol  and or any Recreational drug use. ° °Wear Seat belts while driving. ° °Please note: °You were cared for by a hospitalist during your hospital stay. Once you are discharged,  your primary care physician will handle any further medical issues. Please note that NO REFILLS for any discharge medications will be authorized once you are discharged, as it is imperative that you return to your primary care physician (or establish a relationship with a primary care physician if you do not have one) for your post hospital discharge needs so that they can reassess your need for medications and monitor your lab values. ° ° ° ° °

## 2017-09-01 NOTE — Progress Notes (Signed)
ANTICOAGULATION CONSULT NOTE - Initial Consult  Pharmacy Consult for eliquis Indication: atrial fibrillation  Allergies  Allergen Reactions  . Atorvastatin Other (See Comments)    myalgias   . Crestor [Rosuvastatin] Other (See Comments)    Myalgias   . Livalo [Pitavastatin] Other (See Comments)    myalgias  . Shellfish Allergy Itching, Swelling and Rash    Patient Measurements: Height: 5' 3.5" (161.3 cm) Weight: 186 lb 11.7 oz (84.7 kg) IBW/kg (Calculated) : 53.55   Vital Signs: Temp: 98.1 F (36.7 C) (11/27 0600) Temp Source: Oral (11/27 0600) BP: 121/64 (11/27 0600) Pulse Rate: 69 (11/27 0600)  Labs: Recent Labs    08/30/17 1915 08/30/17 2009 08/31/17 0603 09/01/17 0554  HGB 14.5 13.6 13.8 13.6  HCT 44.9 40.0 43.2 43.0  PLT 343  --  353 328  APTT 28  --   --   --   LABPROT 12.6  --   --   --   INR 0.95  --   --   --   CREATININE 0.92 1.00 0.65 0.64    Estimated Creatinine Clearance: 57.5 mL/min (by C-G formula based on SCr of 0.64 mg/dL).   Medical History: Past Medical History:  Diagnosis Date  . Arthritis    back, left shoulder, and fingers  . Atrial fibrillation ([place])   . Hyperlipidemia   . Hypertension   . Osteoporosis     Medications:  Medications Prior to Admission  Medication Sig Dispense Refill Last Dose  . acetaminophen (TYLENOL) 500 MG tablet Take 1,000 mg by mouth every 6 (six) hours as needed for mild pain or moderate pain.   08/30/2017 at Unknown time  . aspirin 81 MG tablet Take 81 mg by mouth at bedtime.    08/29/2017 at Unknown time  . Calcium Carbonate-Vitamin D (CALCIUM + D PO) Take 1 tablet by mouth at bedtime.   Past Week at Unknown time  . Cascara [place] 450 MG CAPS Take 2 capsules by mouth daily.   Past Week at Unknown time  . Cholecalciferol (VITAMIN D3) 1000 UNITS tablet Take 2,000 Units by mouth at bedtime.    Past Week at Unknown time  . diltiazem (CARDIZEM CD) 120 MG 24 hr capsule TAKE ONE (1) CAPSULE EACH DAY 90  capsule 0 Past Week at Unknown time  . lisinopril-hydrochlorothiazide (PRINZIDE,ZESTORETIC) 10-12.5 MG tablet TAKE 1 TABLET DAILY AT BEDTIME 90 tablet 3 Past Week at Unknown time  . Multiple Vitamin (MULTIVITAMIN WITH MINERALS) TABS tablet Take 1 tablet by mouth at bedtime.   Past Week at Unknown time  . nitroGLYCERIN (NITROSTAT) 0.4 MG SL tablet Place 1 tablet (0.4 mg total) under the tongue as needed. 25 tablet 11 unknown  . omega-3 acid ethyl esters (LOVAZA) 1 g capsule TAKE ONE CAPSULE BY MOUTH TWICE A DAY 120 capsule 0 Past Week at Unknown time  . potassium chloride (K-DUR) 10 MEQ tablet TAKE ONE TABLET DAILY AT BEDTIME 90 tablet 0 Past Week at Unknown time  . Psyllium (NAT-RUL PSYLLIUM SEED HUSKS) 500 MG CAPS Take 2 capsules by mouth daily.   Past Week at Unknown time    Assessment: 83 yo lady with cardioembolic stroke due to afib.  OK with neurology to start eliquis. Goal of Therapy:  Therapeutic anticoagulation Monitor platelets by anticoagulation protocol: Yes   Plan:  Eliquis 5mg  po bid Monitor for bleeding complications F/u eliquis education  Aul Mangieri [place] 09/01/2017,8:59 AM

## 2017-09-01 NOTE — Care Management Note (Signed)
Case Management Note  Patient Details  Name: Nancy Mccormick MRN: [id number] Date of Birth: 1933-12-29   Expected Discharge Date:  09/01/17               Expected Discharge Plan:  [place]  In-House Referral:  NA  Discharge planning Services     Post Acute Care Choice:  Home Health Choice offered to:  Patient  HH Arranged:  PT, Speech Therapy HH Agency:  [place]  Status of Service:  Completed, signed off Additional Comments: Discharging home today with [name]. Vaughan Basta, [facility] rep, aware of DC today.   Sherald Barge, RN 09/01/2017, 1:17 PM

## 2017-09-01 NOTE — Progress Notes (Signed)
Physical Therapy Treatment Patient Details Name: Josefine Fuhr MRN: [id number] DOB: [birth year] Today's Date: 09/01/2017    History of Present Illness Faron Whitelock is an 83 y.o. female with no previous CVA or CAD who reports "I had afib once" who takes ASA daily, who had a HA today (all over) without NV or neuro changes. Apparently, sometime after waking up this afternoon (maybe around 15:30) noted by family to have trouble finding words. Was able to follow physical commands and could hear well.  Brought to ER.    PT Comments    Pt sitting in bed, eager to work with therapist.  Reports she may be going home today.  No pain, issues or limitations voiced.  Pt able to independently transfer and ambulate 500 feet without AD or gait deviations.  No balance limitations and no SOB noted.  Pt appears to have no further skilled therapy needs.  Pt returned to chair with nursing present.   Follow Up Recommendations        Equipment Recommendations       Recommendations for Other Services       Precautions / Restrictions Precautions Precautions: None    Mobility  Bed Mobility Overal bed mobility: Independent                Transfers Overall transfer level: Independent Equipment used: None                Ambulation/Gait Ambulation/Gait assistance: Independent Ambulation Distance (Feet): 500 Feet Assistive device: None Gait Pattern/deviations: WFL(Within Functional Limits)   Gait velocity interpretation: at or above normal speed for age/gender     Stairs            Wheelchair Mobility    Modified Rankin (Stroke Patients Only)       Balance Overall balance assessment: No apparent balance deficits (not formally assessed)                                          Cognition Arousal/Alertness: Awake/alert Behavior During Therapy: WFL for tasks assessed/performed Overall Cognitive Status: Within Functional Limits for tasks assessed                                         Exercises      General Comments        Pertinent Vitals/Pain Pain Assessment: No/denies pain    Home Living                      Prior Function            PT Goals (current goals can now be found in the care plan section) Progress towards PT goals: Goals met and updated - see care plan    Frequency           PT Plan Other (comment)         Notify therapist of goal progression. Co-evaluation              AM-PAC PT "6 Clicks" Daily Activity  Outcome Measure                   End of Session Equipment Utilized During Treatment: Gait belt Activity Tolerance: Patient tolerated treatment well Patient left: in chair;with call bell/phone within reach Nurse  Communication: Mobility status       Time: 8978-4784 PT Time Calculation (min) (ACUTE ONLY): 10 min  Charges:  $Gait Training: 8-22 mins                    G Codes:       Teena Irani, PTA/CLT [phone]    Mare Ferrari, Carisa Backhaus B 09/01/2017, 9:02 AM

## 2017-09-02 NOTE — Telephone Encounter (Signed)
appt scheduled Pt notified 

## 2017-09-03 ENCOUNTER — Ambulatory Visit: Payer: Medicare HMO | Admitting: Family Medicine

## 2017-09-03 ENCOUNTER — Encounter: Payer: Self-pay | Admitting: Family Medicine

## 2017-09-03 VITALS — BP 128/76 | HR 73 | Temp 97.7°F | Ht 63.5 in | Wt 188.0 lb

## 2017-09-03 DIAGNOSIS — I6930 Unspecified sequelae of cerebral infarction: Secondary | ICD-10-CM

## 2017-09-03 DIAGNOSIS — I480 Paroxysmal atrial fibrillation: Secondary | ICD-10-CM | POA: Diagnosis not present

## 2017-09-03 DIAGNOSIS — Z789 Other specified health status: Secondary | ICD-10-CM | POA: Diagnosis not present

## 2017-09-03 DIAGNOSIS — R5383 Other fatigue: Secondary | ICD-10-CM | POA: Diagnosis not present

## 2017-09-03 DIAGNOSIS — Z7901 Long term (current) use of anticoagulants: Secondary | ICD-10-CM | POA: Diagnosis not present

## 2017-09-03 DIAGNOSIS — E785 Hyperlipidemia, unspecified: Secondary | ICD-10-CM

## 2017-09-03 DIAGNOSIS — I10 Essential (primary) hypertension: Secondary | ICD-10-CM | POA: Diagnosis not present

## 2017-09-03 DIAGNOSIS — M810 Age-related osteoporosis without current pathological fracture: Secondary | ICD-10-CM | POA: Diagnosis not present

## 2017-09-03 DIAGNOSIS — I69351 Hemiplegia and hemiparesis following cerebral infarction affecting right dominant side: Secondary | ICD-10-CM | POA: Diagnosis not present

## 2017-09-03 DIAGNOSIS — Z87891 Personal history of nicotine dependence: Secondary | ICD-10-CM | POA: Diagnosis not present

## 2017-09-03 DIAGNOSIS — M1991 Primary osteoarthritis, unspecified site: Secondary | ICD-10-CM | POA: Diagnosis not present

## 2017-09-03 DIAGNOSIS — I63412 Cerebral infarction due to embolism of left middle cerebral artery: Secondary | ICD-10-CM

## 2017-09-03 NOTE — Patient Instructions (Signed)
With speech therapy Reduce stress as much as possible Follow-up with a cardiologist as planned next Tuesday Continue current treatment regimen and have cardiologist review meds and make changes as he recommends at that visit.

## 2017-09-03 NOTE — Progress Notes (Signed)
Subjective:    Patient ID: Nancy Mccormick, female    DOB: [birth year], 83 y.o.   MRN: [id number]  HPI Patient here today for hospital follow up from [facility] where she was diagnosed with CVA. She was discharged on 09/01/17.  The patient was recently seen in the hospital emergency room for a cerebrovascular accident on the 25th of this month.  She was discharged on the 27th.  She was only taking an aspirin daily.  At the time of the CVA she had a headache without any nausea vomiting or neurological changes.  Sometime later in the day after waking up she was unable to express herself well with words.  Her speech got better during the visit to the emergency room.  No other abnormalities were noted.  The result was a small acute cortical infarct in the left middle cerebral artery.  She was started on Eliquis.  She is taking this 5 mg twice a day.  The ER physician in the hospital wanted to retry a statin drug.  The diltiazem was increased to 180 mg daily and she is to be followed up with a cardiologist soon.  It was felt that the stroke was cardioembolic in nature.  So far the patient is continued to take lisinopril HCT 10-25 and change from diltiazem 120 to 180.  The patient is at home and was there from Sunday until Tuesday.  She has had no further episodes of weakness or slurred speech.  She does have a little bit of trouble expressing herself at times and is currently getting speech therapy.  She denies any chest pain or shortness of breath.  She denies any trouble with nausea vomiting diarrhea blood in the stool or black tarry bowel movements.  She has had some constipation and they have asked her to leave off the cascara.  She will try some MiraLAX or Colace instead.  She will also drink plenty of fluids.  She is passing her water without problems.  She does complain of a slight headache today.  She has not checked her blood pressure at home but can do this and will get some readings before she sees the  cardiologist next Tuesday.     Patient Active Problem List   Diagnosis Date Noted  . Statin intolerance 09/01/2017  . CVA (cerebral vascular accident) ([place]) 08/30/2017  . Acute medial meniscal tear 07/03/2015  . Osteopenia 04/19/2014  . Palpitations 11/04/2013  . Arrhythmia 11/04/2013  . FIBRILLATION, ATRIAL 09/11/2010  . Dyslipidemia (high LDL; low HDL) 09/10/2010  . Essential hypertension 09/10/2010   Outpatient Encounter Medications as of 09/03/2017  Medication Sig  . acetaminophen (TYLENOL) 500 MG tablet Take 1,000 mg by mouth every 6 (six) hours as needed for mild pain or moderate pain.  Marland Kitchen apixaban (ELIQUIS) 5 MG TABS tablet Take 1 tablet (5 mg total) by mouth 2 (two) times daily.  . Calcium Carbonate-Vitamin D (CALCIUM + D PO) Take 1 tablet by mouth at bedtime.  . Cholecalciferol (VITAMIN D3) 1000 UNITS tablet Take 2,000 Units by mouth at bedtime.   Marland Kitchen diltiazem (CARDIZEM CD) 180 MG 24 hr capsule Take 1 capsule (180 mg total) by mouth daily.  . Multiple Vitamin (MULTIVITAMIN WITH MINERALS) TABS tablet Take 1 tablet by mouth at bedtime.  . nitroGLYCERIN (NITROSTAT) 0.4 MG SL tablet Place 1 tablet (0.4 mg total) under the tongue as needed.  Marland Kitchen omega-3 acid ethyl esters (LOVAZA) 1 g capsule TAKE ONE CAPSULE BY MOUTH TWICE A DAY  .  Psyllium (NAT-RUL PSYLLIUM SEED HUSKS) 500 MG CAPS Take 2 capsules by mouth daily.   No facility-administered encounter medications on file as of 09/03/2017.      Review of Systems  Constitutional: Positive for fatigue (gets very sleepy ).  HENT: Negative.   Eyes: Negative.   Respiratory: Negative.   Cardiovascular: Negative.   Gastrointestinal: Negative.   Endocrine: Negative.   Genitourinary: Negative.   Musculoskeletal: Negative.   Skin: Negative.   Allergic/Immunologic: Negative.   Neurological: Positive for weakness and headaches.  Hematological: Negative.   Psychiatric/Behavioral: Negative.        Objective:   Physical Exam    Constitutional: She is oriented to person, place, and time. She appears well-developed and well-nourished. No distress.  HENT:  Head: Normocephalic and atraumatic.  Right Ear: External ear normal.  Left Ear: External ear normal.  Nose: Nose normal.  Mouth/Throat: Oropharynx is clear and moist.  Eyes: Conjunctivae and EOM are normal. Pupils are equal, round, and reactive to light. Right eye exhibits no discharge. Left eye exhibits no discharge. No scleral icterus.  Neck: Normal range of motion. Neck supple. No JVD present. No thyromegaly present.  Cardiovascular: Normal rate, regular rhythm, normal heart sounds and intact distal pulses.  No murmur heard. 84/min with a regular rate and rhythm  Pulmonary/Chest: Effort normal and breath sounds normal. No respiratory distress. She has no wheezes. She has no rales.  Abdominal: Soft. Bowel sounds are normal. She exhibits no mass. There is no tenderness. There is no rebound and no guarding.  Musculoskeletal: Normal range of motion. She exhibits no edema.  Lymphadenopathy:    She has no cervical adenopathy.  Neurological: She is alert and oriented to person, place, and time. She has normal reflexes. No cranial nerve deficit.  Good strength bilaterally in both upper and lower extremities  Skin: Skin is warm and dry. No rash noted.  Psychiatric: She has a normal mood and affect. Her behavior is normal. Judgment and thought content normal.  Nursing note and vitals reviewed.   BP 128/76 (BP Location: Right Arm)   Pulse 73   Temp 97.7 F (36.5 C) (Oral)   Ht 5' 3.5" (1.613 m)   Wt 188 lb (85.3 kg)   BMI 32.78 kg/m        Assessment & Plan:  1. Cerebrovascular accident (CVA) due to embolic occlusion of left middle cerebral artery ([place]) -The patient had a cardioembolic event to the left middle cerebral artery.  She is recovered quite well from this other than today feeling tired and having a slight headache.  She still indicates that she has  a few problems with her words at times.  She is getting speech therapy.  2. Caregiver with fatigue -She has been under tremendous stress recently because of the needs of her husband who is at home.  The family is making arrangements to have 24-hour care and looking at placing her husband and a nursing facility to relieve the stress on the primary caregiver who is the wife and on the sons also.  3. Statin intolerance -After the visit to the cardiologist we would like to try again a low dose statin like Crestor once or twice a week to see if she can tolerate this.  We will wait until the cardiology visit is completed first.  He will see her next Tuesday.  4. Hyperlipidemia, unspecified hyperlipidemia type -Consider trying Crestor 2-1/2 mg on Monday Wednesday and Friday.  See cardiologist first.  Patient Instructions  With speech therapy Reduce stress as much as possible Follow-up with a cardiologist as planned next Tuesday Continue current treatment regimen and have cardiologist review meds and make changes as he recommends at that visit.  Arrie Senate MD

## 2017-09-06 NOTE — Progress Notes (Signed)
HPI The patient presents for followup of atrial fibrillation which was documented in  2011.  I saw her in 2015 because of palpitations but an event monitor at that time did not demonstrate fibrillation.  Another monitor in Jan 2017 also did not show fib. She had some palpitations last year and was said to be in atrial fib at Dr. Tawanna Sat office.  However, by the time the EKG was taken she was in sinus and an event monitor was placed again.  I reviewed this and again there is no fibrillation.  She was admitted in Nov with a small MCA infarct and was in atrial fib.  I reviewed these records for this visit.    She was left with some speech deficit and slight memory deficits.  She does feel the palpitations but this really is not different than it was previously.  She has not had any presyncope or syncope.  She has been under a lot of stress that she cares for her husband who is quite ill and for the next 30 days the family has hired help but she is stressed trying to figure out what to do after this.  Of note she was sent home on Cardizem 180 mg but she went back to the 120.  She was told to stop lisinopril and she has not been taking this.  She denies any presyncope or syncope.  She is not having chest pressure, neck or arm discomfort.  She is working with speech therapy.   Allergies  Allergen Reactions  . Atorvastatin Other (See Comments)    myalgias   . Crestor [Rosuvastatin] Other (See Comments)    Myalgias   . Livalo [Pitavastatin] Other (See Comments)    myalgias  . Shellfish Allergy Itching, Swelling and Rash    Current Outpatient Medications  Medication Sig Dispense Refill  . acetaminophen (TYLENOL) 500 MG tablet Take 1,000 mg by mouth every 6 (six) hours as needed for mild pain or moderate pain.    Marland Kitchen apixaban (ELIQUIS) 5 MG TABS tablet Take 1 tablet (5 mg total) by mouth 2 (two) times daily. 60 tablet 0  . Calcium Carbonate-Vitamin D (CALCIUM + D PO) Take 1 tablet by mouth at bedtime.     . Cholecalciferol (VITAMIN D3) 1000 UNITS tablet Take 2,000 Units by mouth at bedtime.     Marland Kitchen diltiazem (CARDIZEM SR) 120 MG 12 hr capsule Take 120 mg by mouth daily.    . Multiple Vitamin (MULTIVITAMIN WITH MINERALS) TABS tablet Take 1 tablet by mouth at bedtime.    . nitroGLYCERIN (NITROSTAT) 0.4 MG SL tablet Place 1 tablet (0.4 mg total) under the tongue as needed. 25 tablet 11  . omega-3 acid ethyl esters (LOVAZA) 1 g capsule TAKE ONE CAPSULE BY MOUTH TWICE A DAY 120 capsule 0  . Psyllium (NAT-RUL PSYLLIUM SEED HUSKS) 500 MG CAPS Take 2 capsules by mouth daily.     No current facility-administered medications for this visit.     Past Medical History:  Diagnosis Date  . Arthritis    back, left shoulder, and fingers  . Atrial fibrillation ([place])   . Hyperlipidemia   . Hypertension   . Osteoporosis     Past Surgical History:  Procedure Laterality Date  . CATARACT EXTRACTION    . KNEE ARTHROSCOPY Right 07/04/2015   Procedure: RIGHT KNEE ARTHROSCOPY WITH MEDIAL MENISCAL DEBRIDEMENT AND CHONDROPLASTY;  Surgeon: Gaynelle Arabian, MD;  Location: [facility];  Service: Orthopedics;  Laterality: Right;  .  OPEN REDUCTION INTERNAL FIXATION (ORIF) DISTAL RADIAL FRACTURE Right 12/28/2012   Procedure: OPEN REDUCTION INTERNAL FIXATION (ORIF) DISTAL RADIAL FRACTURE;  Surgeon: Hessie Dibble, MD;  Location: [place];  Service: Orthopedics;  Laterality: Right;  Marland Kitchen VAGINAL HYSTERECTOMY      ROS:   As stated in the HPI and negative for all other systems.  PHYSICAL EXAM BP (!) 98/52   Pulse 62   Ht 5\' 4"  (1.626 m)   Wt 187 lb 6.4 oz (85 kg)   SpO2 95%   BMI 32.17 kg/m  GENERAL:  Well appearing NECK:  No jugular venous distention, waveform within normal limits, carotid upstroke brisk and symmetric, no bruits, no thyromegaly LUNGS:  Clear to auscultation bilaterally CHEST:  Unremarkable HEART:  PMI not displaced or sustained,S1 and S2 within normal limits, no S3, no S4, no clicks,  no rubs, no murmurs ABD:  Flat, positive bowel sounds normal in frequency in pitch, no bruits, no rebound, no guarding, no midline pulsatile mass, no hepatomegaly, no splenomegaly EXT:  2 plus pulses throughout, no edema, no cyanosis no clubbing  EKG:  NA .    ASSESSMENT AND PLAN  ATRIAL FIBRILLATION:  She is now on  Eliquis.   Interestingly admitting EKG was not atrial fibrillation.  2 rhythm strips that I can see from that hospitalization are not atrial fibrillation.  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  She also has chronic multifocal atrial tachycardia as well.  At this point her blood pressure wont tolerate up titration and she is not particularly symptomatic so she will remain on the lower dose 120.    HTN:   Her BP I low.  She will remain off of the lisinopril. herapy is indicated.  CVA:  Continue as above.    DYSLIPIDEMIA: The question was raised again about starting a statin.  However, she does have a markedly elevated LDL would need therapy but I do not want to add more polypharmacy at this time but will consider this later after she is recovered further from her stroke.  She agrees with this plan.

## 2017-09-07 DIAGNOSIS — Z7901 Long term (current) use of anticoagulants: Secondary | ICD-10-CM | POA: Diagnosis not present

## 2017-09-07 DIAGNOSIS — E785 Hyperlipidemia, unspecified: Secondary | ICD-10-CM | POA: Diagnosis not present

## 2017-09-07 DIAGNOSIS — Z87891 Personal history of nicotine dependence: Secondary | ICD-10-CM | POA: Diagnosis not present

## 2017-09-07 DIAGNOSIS — I69351 Hemiplegia and hemiparesis following cerebral infarction affecting right dominant side: Secondary | ICD-10-CM | POA: Diagnosis not present

## 2017-09-07 DIAGNOSIS — I480 Paroxysmal atrial fibrillation: Secondary | ICD-10-CM | POA: Diagnosis not present

## 2017-09-07 DIAGNOSIS — M1991 Primary osteoarthritis, unspecified site: Secondary | ICD-10-CM | POA: Diagnosis not present

## 2017-09-07 DIAGNOSIS — I10 Essential (primary) hypertension: Secondary | ICD-10-CM | POA: Diagnosis not present

## 2017-09-07 DIAGNOSIS — M810 Age-related osteoporosis without current pathological fracture: Secondary | ICD-10-CM | POA: Diagnosis not present

## 2017-09-08 ENCOUNTER — Encounter: Payer: Self-pay | Admitting: Cardiology

## 2017-09-08 ENCOUNTER — Ambulatory Visit: Payer: Medicare HMO | Admitting: Cardiology

## 2017-09-08 VITALS — BP 98/52 | HR 62 | Ht 64.0 in | Wt 187.4 lb

## 2017-09-08 DIAGNOSIS — I63419 Cerebral infarction due to embolism of unspecified middle cerebral artery: Secondary | ICD-10-CM

## 2017-09-08 DIAGNOSIS — I480 Paroxysmal atrial fibrillation: Secondary | ICD-10-CM

## 2017-09-08 NOTE — Patient Instructions (Signed)
Medication Instructions:  Continue current medications  If you need a refill on your cardiac medications before your next appointment, please call your pharmacy.  Labwork: None Ordered   Testing/Procedures: None Ordered  Follow-Up: Your physician wants you to follow-up in: 2 Months in [place].   Thank you for choosing [facility] at [facility]!!

## 2017-09-09 DIAGNOSIS — E785 Hyperlipidemia, unspecified: Secondary | ICD-10-CM | POA: Diagnosis not present

## 2017-09-09 DIAGNOSIS — M1991 Primary osteoarthritis, unspecified site: Secondary | ICD-10-CM | POA: Diagnosis not present

## 2017-09-09 DIAGNOSIS — Z87891 Personal history of nicotine dependence: Secondary | ICD-10-CM | POA: Diagnosis not present

## 2017-09-09 DIAGNOSIS — I69351 Hemiplegia and hemiparesis following cerebral infarction affecting right dominant side: Secondary | ICD-10-CM | POA: Diagnosis not present

## 2017-09-09 DIAGNOSIS — I10 Essential (primary) hypertension: Secondary | ICD-10-CM | POA: Diagnosis not present

## 2017-09-09 DIAGNOSIS — M810 Age-related osteoporosis without current pathological fracture: Secondary | ICD-10-CM | POA: Diagnosis not present

## 2017-09-09 DIAGNOSIS — I480 Paroxysmal atrial fibrillation: Secondary | ICD-10-CM | POA: Diagnosis not present

## 2017-09-09 DIAGNOSIS — Z7901 Long term (current) use of anticoagulants: Secondary | ICD-10-CM | POA: Diagnosis not present

## 2017-09-21 DIAGNOSIS — M1991 Primary osteoarthritis, unspecified site: Secondary | ICD-10-CM | POA: Diagnosis not present

## 2017-09-21 DIAGNOSIS — Z7901 Long term (current) use of anticoagulants: Secondary | ICD-10-CM | POA: Diagnosis not present

## 2017-09-21 DIAGNOSIS — I480 Paroxysmal atrial fibrillation: Secondary | ICD-10-CM | POA: Diagnosis not present

## 2017-09-21 DIAGNOSIS — M810 Age-related osteoporosis without current pathological fracture: Secondary | ICD-10-CM | POA: Diagnosis not present

## 2017-09-21 DIAGNOSIS — I10 Essential (primary) hypertension: Secondary | ICD-10-CM | POA: Diagnosis not present

## 2017-09-21 DIAGNOSIS — Z87891 Personal history of nicotine dependence: Secondary | ICD-10-CM | POA: Diagnosis not present

## 2017-09-21 DIAGNOSIS — I69351 Hemiplegia and hemiparesis following cerebral infarction affecting right dominant side: Secondary | ICD-10-CM | POA: Diagnosis not present

## 2017-09-21 DIAGNOSIS — E785 Hyperlipidemia, unspecified: Secondary | ICD-10-CM | POA: Diagnosis not present

## 2017-09-22 ENCOUNTER — Ambulatory Visit (INDEPENDENT_AMBULATORY_CARE_PROVIDER_SITE_OTHER): Payer: Medicare HMO

## 2017-09-22 DIAGNOSIS — E785 Hyperlipidemia, unspecified: Secondary | ICD-10-CM

## 2017-09-22 DIAGNOSIS — I480 Paroxysmal atrial fibrillation: Secondary | ICD-10-CM | POA: Diagnosis not present

## 2017-09-22 DIAGNOSIS — I69351 Hemiplegia and hemiparesis following cerebral infarction affecting right dominant side: Secondary | ICD-10-CM | POA: Diagnosis not present

## 2017-09-22 DIAGNOSIS — I10 Essential (primary) hypertension: Secondary | ICD-10-CM | POA: Diagnosis not present

## 2017-09-25 DIAGNOSIS — I480 Paroxysmal atrial fibrillation: Secondary | ICD-10-CM | POA: Diagnosis not present

## 2017-09-25 DIAGNOSIS — Z7901 Long term (current) use of anticoagulants: Secondary | ICD-10-CM | POA: Diagnosis not present

## 2017-09-25 DIAGNOSIS — M1991 Primary osteoarthritis, unspecified site: Secondary | ICD-10-CM | POA: Diagnosis not present

## 2017-09-25 DIAGNOSIS — Z87891 Personal history of nicotine dependence: Secondary | ICD-10-CM | POA: Diagnosis not present

## 2017-09-25 DIAGNOSIS — E785 Hyperlipidemia, unspecified: Secondary | ICD-10-CM | POA: Diagnosis not present

## 2017-09-25 DIAGNOSIS — I69351 Hemiplegia and hemiparesis following cerebral infarction affecting right dominant side: Secondary | ICD-10-CM | POA: Diagnosis not present

## 2017-09-25 DIAGNOSIS — M810 Age-related osteoporosis without current pathological fracture: Secondary | ICD-10-CM | POA: Diagnosis not present

## 2017-09-25 DIAGNOSIS — I10 Essential (primary) hypertension: Secondary | ICD-10-CM | POA: Diagnosis not present

## 2017-10-05 ENCOUNTER — Other Ambulatory Visit: Payer: Self-pay | Admitting: Family Medicine

## 2017-11-05 ENCOUNTER — Other Ambulatory Visit: Payer: Self-pay | Admitting: Family Medicine

## 2017-11-11 ENCOUNTER — Encounter: Payer: Self-pay | Admitting: Cardiology

## 2017-11-17 NOTE — Progress Notes (Deleted)
HPI The patient presents for followup of atrial fibrillation which was documented in  2011.   She was admitted in Nov with a small MCA infarct and was in atrial fib.  She was left with some speech deficit and slight memory deficits.  ***    She does feel the palpitations.  She has not had any presyncope or syncope.  She has been under a lot of stress that she cares for her husband who is quite ill and for the next 30 days the family has hired help but she is stressed trying to figure out what to do after this.  Of note she was sent home on Cardizem 180 mg but she went back to the 120.  She was told to stop lisinopril and she has not been taking this.  She denies any presyncope or syncope.  She is not having chest pressure, neck or arm discomfort.  She is working with speech therapy.   Allergies  Allergen Reactions  . Atorvastatin Other (See Comments)    myalgias   . Crestor [Rosuvastatin] Other (See Comments)    Myalgias   . Livalo [Pitavastatin] Other (See Comments)    myalgias  . Shellfish Allergy Itching, Swelling and Rash    Current Outpatient Medications  Medication Sig Dispense Refill  . acetaminophen (TYLENOL) 500 MG tablet Take 1,000 mg by mouth every 6 (six) hours as needed for mild pain or moderate pain.    . Calcium Carbonate-Vitamin D (CALCIUM + D PO) Take 1 tablet by mouth at bedtime.    . Cholecalciferol (VITAMIN D3) 1000 UNITS tablet Take 2,000 Units by mouth at bedtime.     Marland Kitchen diltiazem (CARDIZEM SR) 120 MG 12 hr capsule Take 120 mg by mouth daily.    Marland Kitchen ELIQUIS 5 MG TABS tablet TAKE ONE TABLET BY MOUTH TWICE DAILY 60 tablet 4  . Multiple Vitamin (MULTIVITAMIN WITH MINERALS) TABS tablet Take 1 tablet by mouth at bedtime.    . nitroGLYCERIN (NITROSTAT) 0.4 MG SL tablet Place 1 tablet (0.4 mg total) under the tongue as needed. 25 tablet 11  . omega-3 acid ethyl esters (LOVAZA) 1 g capsule TAKE ONE CAPSULE BY MOUTH TWICE A DAY 120 capsule 0  . Psyllium (NAT-RUL PSYLLIUM  SEED HUSKS) 500 MG CAPS Take 2 capsules by mouth daily.     No current facility-administered medications for this visit.     Past Medical History:  Diagnosis Date  . Arthritis    back, left shoulder, and fingers  . Atrial fibrillation ([place])   . Hyperlipidemia   . Hypertension   . Osteoporosis     Past Surgical History:  Procedure Laterality Date  . CATARACT EXTRACTION    . KNEE ARTHROSCOPY Right 07/04/2015   Procedure: RIGHT KNEE ARTHROSCOPY WITH MEDIAL MENISCAL DEBRIDEMENT AND CHONDROPLASTY;  Surgeon: Gaynelle Arabian, MD;  Location: [facility];  Service: Orthopedics;  Laterality: Right;  . OPEN REDUCTION INTERNAL FIXATION (ORIF) DISTAL RADIAL FRACTURE Right 12/28/2012   Procedure: OPEN REDUCTION INTERNAL FIXATION (ORIF) DISTAL RADIAL FRACTURE;  Surgeon: Hessie Dibble, MD;  Location: [facility];  Service: Orthopedics;  Laterality: Right;  Marland Kitchen VAGINAL HYSTERECTOMY      ROS:   ***  PHYSICAL EXAM There were no vitals taken for this visit.  GENERAL:  Well appearing NECK:  No jugular venous distention, waveform within normal limits, carotid upstroke brisk and symmetric, no bruits, no thyromegaly LUNGS:  Clear to auscultation bilaterally CHEST:  Unremarkable HEART:  PMI not  displaced or sustained,S1 and S2 within normal limits, no S3, no S4, no clicks, no rubs, *** murmurs ABD:  Flat, positive bowel sounds normal in frequency in pitch, no bruits, no rebound, no guarding, no midline pulsatile mass, no hepatomegaly, no splenomegaly EXT:  2 plus pulses throughout, no edema, no cyanosis no clubbing    GENERAL:  Well appearing NECK:  No jugular venous distention, waveform within normal limits, carotid upstroke brisk and symmetric, no bruits, no thyromegaly LUNGS:  Clear to auscultation bilaterally CHEST:  Unremarkable HEART:  PMI not displaced or sustained,S1 and S2 within normal limits, no S3, no S4, no clicks, no rubs, no murmurs ABD:  Flat, positive bowel sounds normal in  frequency in pitch, no bruits, no rebound, no guarding, no midline pulsatile mass, no hepatomegaly, no splenomegaly EXT:  2 plus pulses throughout, no edema, no cyanosis no clubbing  EKG:  ***.    ASSESSMENT AND PLAN  ATRIAL FIBRILLATION:  Nancy Mccormick has a CHA2DS2 - VASc score of 6.  She is now on  Eliquis.  *** Interestingly admitting EKG was not atrial fibrillation.  2 rhythm strips that I can see from that hospitalization are not atrial fibrillation.  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  She also has chronic multifocal atrial tachycardia as well.  At this point her blood pressure wont tolerate up titration and she is not particularly symptomatic so she will remain on the lower dose 120.    HTN:   Her BP is ***  I low.  She will remain off of the lisinopril. herapy is indicated.  CVA:  ***  Continue as above.    DYSLIPIDEMIA:  *** The question was raised again about starting a statin.  However, she does have a markedly elevated LDL would need therapy but I do not want to add more polypharmacy at this time but will consider this later after she is recovered further from her stroke.  She agrees with this plan.

## 2017-11-18 ENCOUNTER — Ambulatory Visit: Payer: Medicare HMO | Admitting: Cardiology

## 2017-11-25 ENCOUNTER — Encounter: Payer: Self-pay | Admitting: Family Medicine

## 2017-11-25 ENCOUNTER — Ambulatory Visit: Payer: Medicare HMO | Admitting: Family Medicine

## 2017-11-25 VITALS — BP 125/85 | HR 74 | Temp 97.3°F | Ht 64.0 in | Wt 189.0 lb

## 2017-11-25 DIAGNOSIS — R5383 Other fatigue: Secondary | ICD-10-CM | POA: Diagnosis not present

## 2017-11-25 DIAGNOSIS — Z789 Other specified health status: Secondary | ICD-10-CM | POA: Diagnosis not present

## 2017-11-25 DIAGNOSIS — I480 Paroxysmal atrial fibrillation: Secondary | ICD-10-CM | POA: Diagnosis not present

## 2017-11-25 DIAGNOSIS — E559 Vitamin D deficiency, unspecified: Secondary | ICD-10-CM | POA: Diagnosis not present

## 2017-11-25 DIAGNOSIS — I10 Essential (primary) hypertension: Secondary | ICD-10-CM | POA: Diagnosis not present

## 2017-11-25 DIAGNOSIS — E7800 Pure hypercholesterolemia, unspecified: Secondary | ICD-10-CM

## 2017-11-25 NOTE — Patient Instructions (Addendum)
Medicare Annual Wellness Visit  [facility] and the medical providers at [facility] strive to bring you the best medical care.  In doing so we not only want to address your current medical conditions and concerns but also to detect new conditions early and prevent illness, disease and health-related problems.    Medicare offers a yearly Wellness Visit which allows our clinical staff to assess your need for preventative services including immunizations, lifestyle education, counseling to decrease risk of preventable diseases and screening for fall risk and other medical concerns.    This visit is provided free of charge (no copay) for all Medicare recipients. The clinical pharmacists at [facility] have begun to conduct these Wellness Visits which will also include a thorough review of all your medications.    As you primary medical provider recommend that you make an appointment for your Annual Wellness Visit if you have not done so already this year.  You may set up this appointment before you leave today or you may call back ([phone]) and schedule an appointment.  Please make sure when you call that you mention that you are scheduling your Annual Wellness Visit with the clinical pharmacist so that the appointment may be made for the proper length of time.     Continue current medications. Continue good therapeutic lifestyle changes which include good diet and exercise. Fall precautions discussed with patient. If an FOBT was given today- please return it to our front desk. If you are over 52 years old - you may need Prevnar 14 or the adult Pneumonia vaccine.  **Flu shots are available--- please call and schedule a FLU-CLINIC appointment**  After your visit with [place] today you will receive a survey in the mail or online from [employer] regarding your care with [place]. Please take a moment to fill this out. Your feedback is very  important to [place] as you can help [place] better understand your patient needs as well as improve your experience and satisfaction. WE CARE ABOUT YOU!!!   Please do not forget to call the cardiologist and schedule your follow-up visit Try to get as much rest as possible so you can support your husband as much as possible Drink plenty of fluids and stay well-hydrated

## 2017-11-25 NOTE — Progress Notes (Signed)
Subjective:    Patient ID: Nancy Mccormick, female    DOB: 02/20/1934, 84 y.o.   MRN: [id number]  HPI Pt here for follow up and management of chronic medical problems which includes hypertension. She is taking medication regularly.  The patient is stable and doing well.  She has definitely caregiver fatigue because of looking after her husband.  She is a wonderful person who is trying to do more than she is able to do at her age.  She has no specific complaints today.  She is due to get a pelvic exam but prefers to wait on this.  She will be given an FOBT to return and will get lab work done sometime after the 26th of this month.  She refuses the flu vaccine.  She is currently on Eliquis 5 mg and Cardizem SR 120 and omega-3 fatty acids.  The patient denies any chest pain or shortness of breath.  She usually is aware of atrial fib when she has it and she has not noticed any bouts with atrial fib recently.  She also did not keep her recent visit with a cardiologist because of issues with her husband and she plans to call back and reschedule that visit to follow-up with a cardiologist.  She denies any trouble with swallowing heartburn indigestion nausea vomiting diarrhea blood in the stool or black tarry bowel movements.  She is passing her water without problems.     Patient Active Problem List   Diagnosis Date Noted  . Statin intolerance 09/01/2017  . CVA (cerebral vascular accident) ([place]) 08/30/2017  . Acute medial meniscal tear 07/03/2015  . Osteopenia 04/19/2014  . Palpitations 11/04/2013  . Arrhythmia 11/04/2013  . FIBRILLATION, ATRIAL 09/11/2010  . Dyslipidemia (high LDL; low HDL) 09/10/2010  . Essential hypertension 09/10/2010   Outpatient Encounter Medications as of 11/25/2017  Medication Sig  . acetaminophen (TYLENOL) 500 MG tablet Take 1,000 mg by mouth every 6 (six) hours as needed for mild pain or moderate pain.  . Calcium Carbonate-Vitamin D (CALCIUM + D PO) Take 1 tablet by  mouth at bedtime.  . Cholecalciferol (VITAMIN D3) 1000 UNITS tablet Take 2,000 Units by mouth at bedtime.   Marland Kitchen diltiazem (CARDIZEM SR) 120 MG 12 hr capsule Take 120 mg by mouth daily.  Marland Kitchen ELIQUIS 5 MG TABS tablet TAKE ONE TABLET BY MOUTH TWICE DAILY  . Multiple Vitamin (MULTIVITAMIN WITH MINERALS) TABS tablet Take 1 tablet by mouth at bedtime.  . nitroGLYCERIN (NITROSTAT) 0.4 MG SL tablet Place 1 tablet (0.4 mg total) under the tongue as needed.  Marland Kitchen omega-3 acid ethyl esters (LOVAZA) 1 g capsule TAKE ONE CAPSULE BY MOUTH TWICE A DAY  . Psyllium (NAT-RUL PSYLLIUM SEED HUSKS) 500 MG CAPS Take 2 capsules by mouth daily.   No facility-administered encounter medications on file as of 11/25/2017.      Review of Systems  Constitutional: Negative.   HENT: Negative.   Eyes: Negative.   Respiratory: Negative.   Cardiovascular: Negative.   Gastrointestinal: Negative.   Endocrine: Negative.   Genitourinary: Negative.   Musculoskeletal: Negative.   Skin: Negative.   Allergic/Immunologic: Negative.   Neurological: Negative.   Hematological: Negative.   Psychiatric/Behavioral: Negative.        Objective:   Physical Exam  Constitutional: She is oriented to person, place, and time. She appears well-developed and well-nourished. She appears distressed.  The patient is pleasant and alert and just complains of being tired today.  Her husband is currently in rehab  following a GI bleed.  HENT:  Head: Normocephalic and atraumatic.  Right Ear: External ear normal.  Left Ear: External ear normal.  Nose: Nose normal.  Mouth/Throat: Oropharynx is clear and moist. No oropharyngeal exudate.  Eyes: Conjunctivae and EOM are normal. Pupils are equal, round, and reactive to light. Right eye exhibits no discharge. Left eye exhibits no discharge. No scleral icterus.  Neck: Normal range of motion. Neck supple. No thyromegaly present.  No bruits thyromegaly or anterior cervical adenopathy  Cardiovascular: Normal  rate, regular rhythm, normal heart sounds and intact distal pulses.  No murmur heard. Heart had a regular rate and rhythm today at 72/min  Pulmonary/Chest: Effort normal and breath sounds normal. No respiratory distress. She has no wheezes. She has no rales.  Clear anteriorly and posteriorly with good breath sounds  Abdominal: Soft. Bowel sounds are normal. She exhibits no mass. There is no tenderness. There is no rebound and no guarding.  No masses tenderness or organ enlargement or bruits  Musculoskeletal: Normal range of motion. She exhibits no edema.  Lymphadenopathy:    She has no cervical adenopathy.  Neurological: She is alert and oriented to person, place, and time. She has normal reflexes. No cranial nerve deficit.  Skin: Skin is warm and dry. No rash noted.  Psychiatric: She has a normal mood and affect. Her behavior is normal. Judgment and thought content normal.  Nursing note and vitals reviewed.   BP 125/85 (BP Location: Left Arm)   Pulse 74   Temp (!) 97.3 F (36.3 C) (Oral)   Ht _0  (1.626 m)   Wt 189 lb (85.7 kg)   BMI 32.44 kg/m       Assessment & Plan:  1. Essential hypertension, benign -The blood pressure is good today and she will continue with her current treatment regimen - BMP8+EGFR - CBC with Differential/Platelet - Hepatic function panel  2. Vitamin D deficiency -Continue with current treatment pending results of lab work - CBC with Differential/Platelet - VITAMIN D 25 Hydroxy (Vit-D Deficiency, Fractures)  3. Pure hypercholesterolemia -Most of the statins have been tried on this patient and she has been intolerant to them.  We will continue with aggressive therapeutic lifestyle changes pending results of lab work. - CBC with Differential/Platelet - Lipid panel  4. Statin intolerance -Continue with aggressive therapeutic lifestyle changes, follow-up with cardiology  5. Caregiver with fatigue -Patient currently has her son at home but he will  be leaving in a couple of months and she also has daily help with in-home caregiver service.  This is helped her some but she still very fatigued and tired.  6. Paroxysmal atrial fibrillation ([place]) -Patient plans to re-arrange appointment with a cardiologist as she was not able to go to the last visit because of health issues regarding her husband.  Patient Instructions                       Medicare Annual Wellness Visit  [facility] and the medical providers at [facility] strive to bring you the best medical care.  In doing so we not only want to address your current medical conditions and concerns but also to detect new conditions early and prevent illness, disease and health-related problems.    Medicare offers a yearly Wellness Visit which allows our clinical staff to assess your need for preventative services including immunizations, lifestyle education, counseling to decrease risk of preventable diseases and screening for fall risk and  other medical concerns.    This visit is provided free of charge (no copay) for all Medicare recipients. The clinical pharmacists at [facility] have begun to conduct these Wellness Visits which will also include a thorough review of all your medications.    As you primary medical provider recommend that you make an appointment for your Annual Wellness Visit if you have not done so already this year.  You may set up this appointment before you leave today or you may call back ([phone]) and schedule an appointment.  Please make sure when you call that you mention that you are scheduling your Annual Wellness Visit with the clinical pharmacist so that the appointment may be made for the proper length of time.     Continue current medications. Continue good therapeutic lifestyle changes which include good diet and exercise. Fall precautions discussed with patient. If an FOBT was given today- please return it to our  front desk. If you are over 51 years old - you may need Prevnar 95 or the adult Pneumonia vaccine.  **Flu shots are available--- please call and schedule a FLU-CLINIC appointment**  After your visit with [place] today you will receive a survey in the mail or online from [employer] regarding your care with [place]. Please take a moment to fill this out. Your feedback is very important to [place] as you can help [place] better understand your patient needs as well as improve your experience and satisfaction. WE CARE ABOUT YOU!!!   Please do not forget to call the cardiologist and schedule your follow-up visit Try to get as much rest as possible so you can support your husband as much as possible Drink plenty of fluids and stay well-hydrated    Arrie Senate MD

## 2017-11-26 LAB — BMP8+EGFR
BUN / CREAT RATIO: 21 (ref 12–28)
BUN: 17 mg/dL (ref 8–27)
CO2: 24 mmol/L (ref 20–29)
CREATININE: 0.82 mg/dL (ref 0.57–1.00)
Calcium: 9.4 mg/dL (ref 8.7–10.3)
Chloride: 103 mmol/L (ref 96–106)
GFR calc non Af Amer: 67 mL/min/{1.73_m2} (ref >59)
GFR, EST AFRICAN AMERICAN: 78 mL/min/{1.73_m2} (ref >59)
Glucose: 92 mg/dL (ref 65–99)
Potassium: 4.4 mmol/L (ref 3.5–5.2)
SODIUM: 145 mmol/L — AB (ref 134–144)

## 2017-11-26 LAB — CBC WITH DIFFERENTIAL/PLATELET
Basophils Absolute: 0 10*3/uL (ref 0.0–0.2)
Basos: 0 %
EOS (ABSOLUTE): 0.2 10*3/uL (ref 0.0–0.4)
EOS: 2 %
HEMATOCRIT: 43.2 % (ref 34.0–46.6)
HEMOGLOBIN: 14 g/dL (ref 11.1–15.9)
Immature Grans (Abs): 0 10*3/uL (ref 0.0–0.1)
Immature Granulocytes: 0 %
LYMPHS ABS: 2.8 10*3/uL (ref 0.7–3.1)
Lymphs: 27 %
MCH: 30 pg (ref 26.6–33.0)
MCHC: 32.4 g/dL (ref 31.5–35.7)
MCV: 93 fL (ref 79–97)
MONOCYTES: 10 %
MONOS ABS: 1.1 10*3/uL — AB (ref 0.1–0.9)
NEUTROS ABS: 6.2 10*3/uL (ref 1.4–7.0)
Neutrophils: 61 %
Platelets: 373 10*3/uL (ref 150–379)
RBC: 4.66 x10E6/uL (ref 3.77–5.28)
RDW: 14.6 % (ref 12.3–15.4)
WBC: 10.3 10*3/uL (ref 3.4–10.8)

## 2017-11-26 LAB — VITAMIN D 25 HYDROXY (VIT D DEFICIENCY, FRACTURES): VIT D 25 HYDROXY: 26.9 ng/mL — AB (ref 30.0–100.0)

## 2017-11-26 LAB — LIPID PANEL
CHOL/HDL RATIO: 3.5 ratio (ref 0.0–4.4)
Cholesterol, Total: 252 mg/dL — ABNORMAL HIGH (ref 100–199)
HDL: 72 mg/dL (ref >39)
LDL CALC: 142 mg/dL — AB (ref 0–99)
Triglycerides: 192 mg/dL — ABNORMAL HIGH (ref 0–149)
VLDL CHOLESTEROL CAL: 38 mg/dL (ref 5–40)

## 2017-11-26 LAB — HEPATIC FUNCTION PANEL
ALK PHOS: 96 IU/L (ref 39–117)
ALT: 24 IU/L (ref 0–32)
AST: 16 IU/L (ref 0–40)
Albumin: 4.4 g/dL (ref 3.5–4.7)
Bilirubin Total: 0.6 mg/dL (ref 0.0–1.2)
Bilirubin, Direct: 0.15 mg/dL (ref 0.00–0.40)
TOTAL PROTEIN: 6.8 g/dL (ref 6.0–8.5)

## 2017-11-30 ENCOUNTER — Ambulatory Visit: Payer: Medicare HMO | Admitting: Family Medicine

## 2017-11-30 ENCOUNTER — Other Ambulatory Visit: Payer: Self-pay | Admitting: Family Medicine

## 2017-11-30 MED ORDER — VITAMIN D (ERGOCALCIFEROL) 1.25 MG (50000 UNIT) PO CAPS: 50000.0000 [IU] | ORAL_CAPSULE | ORAL | 1 refills | Status: DC

## 2017-11-30 NOTE — Addendum Note (Signed)
Addended byFaylene Million C on: 11/30/2017 11:01 AM   Modules accepted: Orders

## 2017-12-08 DIAGNOSIS — J111 Influenza due to unidentified influenza virus with other respiratory manifestations: Secondary | ICD-10-CM | POA: Diagnosis not present

## 2017-12-09 ENCOUNTER — Other Ambulatory Visit: Payer: Self-pay | Admitting: Family Medicine

## 2017-12-09 ENCOUNTER — Ambulatory Visit: Payer: Medicare HMO | Admitting: Family Medicine

## 2017-12-11 ENCOUNTER — Encounter: Payer: Self-pay | Admitting: Nurse Practitioner

## 2017-12-11 ENCOUNTER — Ambulatory Visit: Payer: Medicare HMO | Admitting: Nurse Practitioner

## 2017-12-11 VITALS — BP 145/75 | HR 77 | Temp 97.3°F | Ht 64.0 in | Wt 193.0 lb

## 2017-12-11 DIAGNOSIS — J111 Influenza due to unidentified influenza virus with other respiratory manifestations: Secondary | ICD-10-CM

## 2017-12-11 NOTE — Patient Instructions (Signed)

## 2017-12-11 NOTE — Progress Notes (Signed)
   Subjective:    Patient ID: Nancy Mccormick, female    DOB: 08-Jun-1934, 84 y.o.   MRN: [id number]  HPI Patient here today for follow up. She went to urgent care on Tuesday and they diagnosed her with flu and gave her tamiflu. She says that she feels no better. She says she is till very congested, achy and weak.   Review of Systems  Constitutional: Negative for chills and fever.  HENT: Positive for congestion. Negative for ear pain, sore throat and trouble swallowing.   Respiratory: Positive for cough (productive some times).   Musculoskeletal: Positive for myalgias.  Neurological: Positive for headaches.       Objective:   Physical Exam  Constitutional: She appears well-developed and well-nourished. She appears distressed (mild).  HENT:  Right Ear: Hearing, tympanic membrane, external ear and ear canal normal.  Left Ear: Hearing, tympanic membrane, external ear and ear canal normal.  Nose: Mucosal edema and rhinorrhea present. Right sinus exhibits no maxillary sinus tenderness and no frontal sinus tenderness. Left sinus exhibits no maxillary sinus tenderness and no frontal sinus tenderness.  Cardiovascular: Normal rate and regular rhythm.  Pulmonary/Chest: Effort normal and breath sounds normal.  Neurological: She is alert.  Skin: Skin is warm.  Psychiatric: She has a normal mood and affect. Her behavior is normal. Judgment and thought content normal.    BP (!) 145/75   Pulse 77   Temp (!) 97.3 F (36.3 C) (Oral)   Ht 5\' 4"  (1.626 m)   Wt 193 lb (87.5 kg)   BMI 33.13 kg/m         Assessment & Plan:   1. Influenza    Resolving Finish tamiflu as ra Motrin or tylenol for fever Rest Force fluids  Call over weekend to on call provider if not improving  Mary-Margaret Hassell Done, FNP

## 2017-12-22 ENCOUNTER — Ambulatory Visit: Payer: Medicare HMO | Admitting: Family

## 2017-12-24 ENCOUNTER — Ambulatory Visit: Payer: Medicare HMO | Admitting: Family Medicine

## 2018-03-03 ENCOUNTER — Other Ambulatory Visit: Payer: Self-pay | Admitting: Family Medicine

## 2018-03-03 NOTE — Progress Notes (Signed)
HPI The patient presents for followup of atrial fibrillation she was admitted in Nov with a small MCA infarct and was in atrial fib.  Since I last saw her her husband of 83 years died in 2023-02-13.  She is been reading and doing some walking to stay busy. The patient denies any new symptoms such as chest discomfort, neck or arm discomfort. There has been no new shortness of breath, PND or orthopnea. There have been no reported palpitations, presyncope or syncope.     Allergies  Allergen Reactions  . Shellfish Allergy Itching, Swelling and Rash  . Atorvastatin Other (See Comments)    myalgias   . Crestor [Rosuvastatin] Other (See Comments)    Myalgias   . Livalo [Pitavastatin] Other (See Comments)    myalgias    Current Outpatient Medications  Medication Sig Dispense Refill  . acetaminophen (TYLENOL) 500 MG tablet Take 1,000 mg by mouth every 6 (six) hours as needed for mild pain or moderate pain.    . Calcium Carbonate-Vitamin D (CALCIUM + D PO) Take 1 tablet by mouth at bedtime.    . Cholecalciferol (VITAMIN D3) 1000 UNITS tablet Take 2,000 Units by mouth at bedtime.     Marland Kitchen diltiazem (CARDIZEM CD) 180 MG 24 hr capsule TAKE ONE (1) CAPSULE EACH DAY 90 capsule 0  . ELIQUIS 5 MG TABS tablet TAKE ONE TABLET BY MOUTH TWICE DAILY 60 tablet 4  . Multiple Vitamin (MULTIVITAMIN WITH MINERALS) TABS tablet Take 1 tablet by mouth at bedtime.    . nitroGLYCERIN (NITROSTAT) 0.4 MG SL tablet Place 1 tablet (0.4 mg total) under the tongue as needed. 25 tablet 11  . omega-3 acid ethyl esters (LOVAZA) 1 g capsule TAKE ONE CAPSULE BY MOUTH TWICE A DAY 120 capsule 0  . Psyllium (NAT-RUL PSYLLIUM SEED HUSKS) 500 MG CAPS Take 2 capsules by mouth daily.    . Vitamin D, Ergocalciferol, (DRISDOL) 50000 units CAPS capsule Take 1 capsule (50,000 Units total) by mouth every 7 (seven) days. 12 capsule 1   No current facility-administered medications for this visit.     Past Medical History:  Diagnosis Date    . Arthritis    back, left shoulder, and fingers  . Atrial fibrillation ([place])   . Hyperlipidemia   . Hypertension   . Osteoporosis     Past Surgical History:  Procedure Laterality Date  . CATARACT EXTRACTION    . KNEE ARTHROSCOPY Right 07/04/2015   Procedure: RIGHT KNEE ARTHROSCOPY WITH MEDIAL MENISCAL DEBRIDEMENT AND CHONDROPLASTY;  Surgeon: Gaynelle Arabian, MD;  Location: [facility];  Service: Orthopedics;  Laterality: Right;  . OPEN REDUCTION INTERNAL FIXATION (ORIF) DISTAL RADIAL FRACTURE Right 12/28/2012   Procedure: OPEN REDUCTION INTERNAL FIXATION (ORIF) DISTAL RADIAL FRACTURE;  Surgeon: Hessie Dibble, MD;  Location: [facility];  Service: Orthopedics;  Laterality: Right;  Marland Kitchen VAGINAL HYSTERECTOMY      ROS:   As stated in the HPI and negative for all other systems.   PHYSICAL EXAM BP (!) 142/92   Pulse 77   Ht 5\' 4"  (1.626 m)   Wt 190 lb 6.4 oz (86.4 kg)   BMI 32.68 kg/m   GENERAL:  Well appearing NECK:  No jugular venous distention, waveform within normal limits, carotid upstroke brisk and symmetric, no bruits, no thyromegaly LUNGS:  Clear to auscultation bilaterally CHEST:  Unremarkable HEART:  PMI not displaced or sustained,S1 and S2 within normal limits, no S3, no S4, no clicks, no rubs, no murmurs  ABD:  Flat, positive bowel sounds normal in frequency in pitch, no bruits, no rebound, no guarding, no midline pulsatile mass, no hepatomegaly, no splenomegaly EXT:  2 plus pulses throughout, no edema, no cyanosis no clubbing   EKG:   Sinus rhythm with premature atrial contractions, axis within normal limits, intervals within normal limits, no acute ST-T wave changes.  Lab Results  Component Value Date   CHOL 252 (H) 11/25/2017   TRIG 192 (H) 11/25/2017   HDL 72 11/25/2017   LDLCALC 142 (H) 11/25/2017     ASSESSMENT AND PLAN  ATRIAL FIBRILLATION:   This was never clearly documented in anything that I could see but we are assuming given the stroke and the  questionable strips suggesting A. fib that she has this in her managing for it.  I will order a CBC.  She mentions occasional black stools     HTN:   Her BP is mildly elevated.   She is going to keep a BP diary.  She might need Norvasc.   CVA:     She has mild speech deficits.   DYSLIPIDEMIA:    LDL is as above.    She does not tolerate any statins.  No change in therapy.

## 2018-03-05 ENCOUNTER — Encounter: Payer: Self-pay | Admitting: Cardiology

## 2018-03-05 ENCOUNTER — Ambulatory Visit: Payer: Medicare HMO | Admitting: Cardiology

## 2018-03-05 VITALS — BP 142/92 | HR 77 | Ht 64.0 in | Wt 190.4 lb

## 2018-03-05 DIAGNOSIS — Z79899 Other long term (current) drug therapy: Secondary | ICD-10-CM

## 2018-03-05 DIAGNOSIS — I10 Essential (primary) hypertension: Secondary | ICD-10-CM | POA: Diagnosis not present

## 2018-03-05 DIAGNOSIS — I480 Paroxysmal atrial fibrillation: Secondary | ICD-10-CM

## 2018-03-05 LAB — CBC
HEMATOCRIT: 37.9 % (ref 34.0–46.6)
HEMOGLOBIN: 13.4 g/dL (ref 11.1–15.9)
MCH: 32.8 pg (ref 26.6–33.0)
MCHC: 35.4 g/dL (ref 31.5–35.7)
MCV: 93 fL (ref 79–97)
Platelets: 359 10*3/uL (ref 150–450)
RBC: 4.08 x10E6/uL (ref 3.77–5.28)
RDW: 14.3 % (ref 12.3–15.4)
WBC: 8.3 10*3/uL (ref 3.4–10.8)

## 2018-03-05 NOTE — Patient Instructions (Signed)
Medication Instructions:  Continue current medications  If you need a refill on your cardiac medications before your next appointment, please call your pharmacy.  Labwork: CBC Today HERE IN OUR OFFICE AT [facility]  Take the provided lab slips with you to the lab for your blood draw.   You will NOT need to fast   Testing/Procedures: None Ordered  Special Instructions: Keep a daily Blood Pressure diary  Follow-Up: Your physician wants you to follow-up in: 6 Months. You should receive a reminder letter in the mail two months in advance. If you do not receive a letter, please call our office [phone].     Thank you for choosing [facility] at [facility]!!

## 2018-03-31 ENCOUNTER — Ambulatory Visit: Payer: Medicare HMO | Admitting: Family Medicine

## 2018-03-31 ENCOUNTER — Encounter: Payer: Self-pay | Admitting: Family Medicine

## 2018-03-31 VITALS — BP 120/71 | HR 70 | Temp 97.6°F | Ht 64.0 in | Wt 187.0 lb

## 2018-03-31 DIAGNOSIS — I10 Essential (primary) hypertension: Secondary | ICD-10-CM | POA: Diagnosis not present

## 2018-03-31 DIAGNOSIS — E7800 Pure hypercholesterolemia, unspecified: Secondary | ICD-10-CM | POA: Diagnosis not present

## 2018-03-31 DIAGNOSIS — M1711 Unilateral primary osteoarthritis, right knee: Secondary | ICD-10-CM | POA: Diagnosis not present

## 2018-03-31 DIAGNOSIS — Z1211 Encounter for screening for malignant neoplasm of colon: Secondary | ICD-10-CM

## 2018-03-31 DIAGNOSIS — I480 Paroxysmal atrial fibrillation: Secondary | ICD-10-CM

## 2018-03-31 DIAGNOSIS — E559 Vitamin D deficiency, unspecified: Secondary | ICD-10-CM | POA: Diagnosis not present

## 2018-03-31 DIAGNOSIS — Z1283 Encounter for screening for malignant neoplasm of skin: Secondary | ICD-10-CM | POA: Diagnosis not present

## 2018-03-31 MED ORDER — TRIAMCINOLONE ACETONIDE 40 MG/ML IJ SUSP
40.0000 mg | Freq: Once | INTRAMUSCULAR | Status: AC
  Administered 2018-03-31: 40 mg via INTRA_ARTICULAR

## 2018-03-31 NOTE — Progress Notes (Signed)
 Subjective:    Patient ID: Nancy Mccormick, female    DOB: 03/29/1934, 84 y.o.   MRN: [id number]  HPI Pt here for follow up and management of chronic medical problems which includes hypertension and hyperlipidemia. She is taking medication regularly.  The patient is doing well overall but does complain of some right knee pain.  She is also concerned about some dark places on her face.  She did bring back a fecal occult blood test card and will get lab work today.  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  The family history is positive for stroke in both parents and her sister.  Patient is pleasant and doing well overall.  She is concerned about some dark places on her face and we will do a referral to the dermatologist for a general skin check and she can bring these up to the dermatologist at that time.  She says that since her husband died that she has been walking regularly and her knee was feeling better and that recently she twisted her knee and is been hurting more since that time.  She has been using the stationary bike and this has not made things any worse with her knee.  She denies any chest pain pressure tightness or shortness of breath.  She denies any trouble with swallowing heartburn indigestion nausea vomiting diarrhea blood in the stool or black tarry bowel movements.  She is not sure who did her last colonoscopy and of the exact date if she needed any further colonoscopies or not.  She is passing her water without problems.  She has an eye exam coming up this next week and will make sure that we get a copy of the report.    Patient Active Problem List   Diagnosis Date Noted  . Statin intolerance 09/01/2017  . CVA (cerebral vascular accident) (HCC) 08/30/2017  . Acute medial meniscal tear 07/03/2015  . Osteopenia 04/19/2014  . Palpitations 11/04/2013  . Arrhythmia 11/04/2013  . FIBRILLATION, ATRIAL 09/11/2010   . Dyslipidemia (high LDL; low HDL) 09/10/2010  . Essential hypertension 09/10/2010   Outpatient Encounter Medications as of 03/31/2018  Medication Sig  . acetaminophen (TYLENOL) 500 MG tablet Take 1,000 mg by mouth every 6 (six) hours as needed for mild pain or moderate pain.  . Calcium Carbonate-Vitamin D (CALCIUM + D PO) Take 1 tablet by mouth at bedtime.  . diltiazem (CARDIZEM CD) 180 MG 24 hr capsule TAKE ONE (1) CAPSULE EACH DAY  . ELIQUIS 5 MG TABS tablet TAKE ONE TABLET BY MOUTH TWICE DAILY  . Multiple Vitamin (MULTIVITAMIN WITH MINERALS) TABS tablet Take 1 tablet by mouth at bedtime.  . nitroGLYCERIN (NITROSTAT) 0.4 MG SL tablet Place 1 tablet (0.4 mg total) under the tongue as needed.  . omega-3 acid ethyl esters (LOVAZA) 1 g capsule TAKE ONE CAPSULE BY MOUTH TWICE A DAY  . Psyllium (NAT-RUL PSYLLIUM SEED HUSKS) 500 MG CAPS Take 2 capsules by mouth daily.  . [DISCONTINUED] Cholecalciferol (VITAMIN D3) 1000 UNITS tablet Take 2,000 Units by mouth at bedtime.   . [DISCONTINUED] Vitamin D, Ergocalciferol, (DRISDOL) 50000 units CAPS capsule Take 1 capsule (50,000 Units total) by mouth every 7 (seven) days.   No facility-administered encounter medications on file as of 03/31/2018.       Review of Systems  Constitutional: Negative.   HENT: Negative.   Eyes: Negative.   Respiratory: Negative.     Cardiovascular: Negative.   Gastrointestinal: Negative.   Endocrine: Negative.   Genitourinary: Negative.   Musculoskeletal: Positive for arthralgias (right knee pain ).  Skin: Negative.   Allergic/Immunologic: Negative.   Neurological: Negative.   Hematological: Negative.   Psychiatric/Behavioral: Negative.        Objective:   Physical Exam  Constitutional: She is oriented to person, place, and time. She appears well-developed and well-nourished. No distress.  The patient is pleasant and alert and doing well since the loss of her husband  HENT:  Head: Normocephalic and atraumatic.    Right Ear: External ear normal.  Left Ear: External ear normal.  Nose: Nose normal.  Mouth/Throat: Oropharynx is clear and moist. No oropharyngeal exudate.  Eyes: Pupils are equal, round, and reactive to light. Conjunctivae and EOM are normal. Right eye exhibits no discharge. Left eye exhibits no discharge. No scleral icterus.  Eye exam is soon  Neck: Normal range of motion. Neck supple. No thyromegaly present.  No bruits thyromegaly or anterior cervical adenopathy  Cardiovascular: Normal rate, regular rhythm, normal heart sounds and intact distal pulses.  No murmur heard. The heart was slightly irregular at 72/min  Pulmonary/Chest: Effort normal and breath sounds normal. No respiratory distress. She has no wheezes. She has no rales.  Clear anteriorly and posteriorly  Abdominal: Soft. Bowel sounds are normal. She exhibits no mass. There is no tenderness. There is no rebound.  No abdominal tenderness masses organ enlargement or bruits  Musculoskeletal: Normal range of motion. She exhibits tenderness. She exhibits no edema.  Tender medial joint line right knee  Lymphadenopathy:    She has no cervical adenopathy.  Neurological: She is alert and oriented to person, place, and time. She has normal reflexes. No cranial nerve deficit.  Skin: Skin is warm and dry. No rash noted.  Psychiatric: She has a normal mood and affect. Her behavior is normal. Judgment and thought content normal.  The patient is pleasant and alert  Nursing note and vitals reviewed.   BP 120/71 (BP Location: Left Arm)   Pulse 70   Temp 97.6 F (36.4 C) (Oral)   Ht 5' 4" (1.626 m)   Wt 187 lb (84.8 kg)   BMI 32.10 kg/m       Assessment & Plan:  1. Essential hypertension, benign -Blood pressure is good and she will continue with current treatment - BMP8+EGFR - CBC with Differential/Platelet - Hepatic function panel  2. Vitamin D deficiency -Continue with current treatment pending results of lab work - CBC  with Differential/Platelet - VITAMIN D 25 Hydroxy (Vit-D Deficiency, Fractures)  3. Pure hypercholesterolemia -Continue with aggressive therapeutic lifestyle changes - CBC with Differential/Platelet - Lipid panel  4. Paroxysmal atrial fibrillation (HCC) -Heart was slightly irregular today at 72/min she will continue to follow-up with cardiology - CBC with Differential/Platelet  5. Screen for colon cancer -FOBT was returned today and we will monitor this and check with the gastroenterologist about any further need for colonoscopy - Fecal occult blood, imunochemical  6. Primary osteoarthritis of right knee -After sterile prep the right knee was injected with 40 of Kenalog and 1 cc of Marcaine. The patient tolerated the procedure well. - triamcinolone acetonide (KENALOG-40) injection 40 mg  Patient Instructions                       Medicare Annual Wellness Visit  [facility] and the medical providers at [facility] strive to bring you the best medical  care.  In doing so we not only want to address your current medical conditions and concerns but also to detect new conditions early and prevent illness, disease and health-related problems.    Medicare offers a yearly Wellness Visit which allows our clinical staff to assess your need for preventative services including immunizations, lifestyle education, counseling to decrease risk of preventable diseases and screening for fall risk and other medical concerns.    This visit is provided free of charge (no copay) for all Medicare recipients. The clinical pharmacists at [facility] have begun to conduct these Wellness Visits which will also include a thorough review of all your medications.    As you primary medical provider recommend that you make an appointment for your Annual Wellness Visit if you have not done so already this year.  You may set up this appointment before you leave today or you  may call back ([phone]) and schedule an appointment.  Please make sure when you call that you mention that you are scheduling your Annual Wellness Visit with the clinical pharmacist so that the appointment may be made for the proper length of time.     Continue current medications. Continue good therapeutic lifestyle changes which include good diet and exercise. Fall precautions discussed with patient. If an FOBT was given today- please return it to our front desk. If you are over 62 years old - you may need Prevnar 70 or the adult Pneumonia vaccine.  **Flu shots are available--- please call and schedule a FLU-CLINIC appointment**  After your visit with [place] today you will receive a survey in the mail or online from [employer] regarding your care with [place]. Please take a moment to fill this out. Your feedback is very important to [place] as you can help [place] better understand your patient needs as well as improve your experience and satisfaction. WE CARE ABOUT YOU!!!   We will schedule you for a general skin appointment and the discolorations on your face with [facility] dermatology If the knee pain continues she should call back and speak to my nurse and she will schedule a visit with you here with Dr. Maureen Ralphs Drink plenty of fluids and stay well-hydrated Continue to follow-up with cardiology Make sure that the ophthalmologist sends [place] a copy of the eye exam report  Arrie Senate MD

## 2018-03-31 NOTE — Addendum Note (Signed)
Addended by: Zannie Cove on: 03/31/2018 11:43 AM   Modules accepted: Orders

## 2018-03-31 NOTE — Patient Instructions (Addendum)
Medicare Annual Wellness Visit  [facility] and the medical providers at [facility] strive to bring you the best medical care.  In doing so we not only want to address your current medical conditions and concerns but also to detect new conditions early and prevent illness, disease and health-related problems.    Medicare offers a yearly Wellness Visit which allows our clinical staff to assess your need for preventative services including immunizations, lifestyle education, counseling to decrease risk of preventable diseases and screening for fall risk and other medical concerns.    This visit is provided free of charge (no copay) for all Medicare recipients. The clinical pharmacists at [facility] have begun to conduct these Wellness Visits which will also include a thorough review of all your medications.    As you primary medical provider recommend that you make an appointment for your Annual Wellness Visit if you have not done so already this year.  You may set up this appointment before you leave today or you may call back ([phone]) and schedule an appointment.  Please make sure when you call that you mention that you are scheduling your Annual Wellness Visit with the clinical pharmacist so that the appointment may be made for the proper length of time.     Continue current medications. Continue good therapeutic lifestyle changes which include good diet and exercise. Fall precautions discussed with patient. If an FOBT was given today- please return it to our front desk. If you are over 6 years old - you may need Prevnar 1 or the adult Pneumonia vaccine.  **Flu shots are available--- please call and schedule a FLU-CLINIC appointment**  After your visit with [place] today you will receive a survey in the mail or online from [employer] regarding your care with [place]. Please take a moment to fill this out. Your feedback is very  important to [place] as you can help [place] better understand your patient needs as well as improve your experience and satisfaction. WE CARE ABOUT YOU!!!   We will schedule you for a general skin appointment and the discolorations on your face with [facility] dermatology If the knee pain continues she should call back and speak to my nurse and she will schedule a visit with you here with Dr. Maureen Ralphs Drink plenty of fluids and stay well-hydrated Continue to follow-up with cardiology Make sure that the ophthalmologist sends [place] a copy of the eye exam report

## 2018-04-01 ENCOUNTER — Other Ambulatory Visit: Payer: Self-pay | Admitting: *Deleted

## 2018-04-01 LAB — LIPID PANEL
CHOL/HDL RATIO: 3.3 ratio (ref 0.0–4.4)
CHOLESTEROL TOTAL: 234 mg/dL — AB (ref 100–199)
HDL: 70 mg/dL (ref >39)
LDL CALC: 133 mg/dL — AB (ref 0–99)
Triglycerides: 157 mg/dL — ABNORMAL HIGH (ref 0–149)
VLDL Cholesterol Cal: 31 mg/dL (ref 5–40)

## 2018-04-01 LAB — VITAMIN D 25 HYDROXY (VIT D DEFICIENCY, FRACTURES): VIT D 25 HYDROXY: 45.2 ng/mL (ref 30.0–100.0)

## 2018-04-01 LAB — CBC WITH DIFFERENTIAL/PLATELET
Basophils Absolute: 0 10*3/uL (ref 0.0–0.2)
Basos: 0 %
EOS (ABSOLUTE): 0.2 10*3/uL (ref 0.0–0.4)
Eos: 2 %
HEMOGLOBIN: 13.8 g/dL (ref 11.1–15.9)
Hematocrit: 42.9 % (ref 34.0–46.6)
IMMATURE GRANS (ABS): 0 10*3/uL (ref 0.0–0.1)
IMMATURE GRANULOCYTES: 0 %
LYMPHS: 26 %
Lymphocytes Absolute: 2.8 10*3/uL (ref 0.7–3.1)
MCH: 30.1 pg (ref 26.6–33.0)
MCHC: 32.2 g/dL (ref 31.5–35.7)
MCV: 94 fL (ref 79–97)
MONOS ABS: 0.8 10*3/uL (ref 0.1–0.9)
Monocytes: 7 %
NEUTROS ABS: 7 10*3/uL (ref 1.4–7.0)
Neutrophils: 65 %
Platelets: 332 10*3/uL (ref 150–450)
RBC: 4.58 x10E6/uL (ref 3.77–5.28)
RDW: 14.8 % (ref 12.3–15.4)
WBC: 10.9 10*3/uL — ABNORMAL HIGH (ref 3.4–10.8)

## 2018-04-01 LAB — BMP8+EGFR
BUN/Creatinine Ratio: 31 — ABNORMAL HIGH (ref 12–28)
BUN: 23 mg/dL (ref 8–27)
CALCIUM: 10.4 mg/dL — AB (ref 8.7–10.3)
CHLORIDE: 103 mmol/L (ref 96–106)
CO2: 25 mmol/L (ref 20–29)
Creatinine, Ser: 0.74 mg/dL (ref 0.57–1.00)
GFR calc Af Amer: 88 mL/min/{1.73_m2} (ref >59)
GFR calc non Af Amer: 76 mL/min/{1.73_m2} (ref >59)
Glucose: 102 mg/dL — ABNORMAL HIGH (ref 65–99)
POTASSIUM: 4.8 mmol/L (ref 3.5–5.2)
Sodium: 144 mmol/L (ref 134–144)

## 2018-04-01 LAB — HEPATIC FUNCTION PANEL
ALK PHOS: 81 IU/L (ref 39–117)
ALT: 23 IU/L (ref 0–32)
AST: 15 IU/L (ref 0–40)
Albumin: 4.4 g/dL (ref 3.5–4.7)
Bilirubin Total: 0.4 mg/dL (ref 0.0–1.2)
Bilirubin, Direct: 0.12 mg/dL (ref 0.00–0.40)
TOTAL PROTEIN: 6.7 g/dL (ref 6.0–8.5)

## 2018-04-01 LAB — FECAL OCCULT BLOOD, IMMUNOCHEMICAL: Fecal Occult Bld: NEGATIVE

## 2018-04-01 MED ORDER — ROSUVASTATIN CALCIUM 5 MG PO TABS: 2.5000 mg | ORAL_TABLET | ORAL | 1 refills | Status: DC

## 2018-04-06 DIAGNOSIS — I10 Essential (primary) hypertension: Secondary | ICD-10-CM | POA: Diagnosis not present

## 2018-04-06 DIAGNOSIS — Z0100 Encounter for examination of eyes and vision without abnormal findings: Secondary | ICD-10-CM | POA: Diagnosis not present

## 2018-04-12 ENCOUNTER — Other Ambulatory Visit: Payer: Self-pay | Admitting: Family Medicine

## 2018-04-26 ENCOUNTER — Other Ambulatory Visit: Payer: Self-pay | Admitting: Family Medicine

## 2018-04-27 ENCOUNTER — Telehealth: Payer: Self-pay | Admitting: Family Medicine

## 2018-04-27 ENCOUNTER — Encounter: Payer: Self-pay | Admitting: Family Medicine

## 2018-04-27 ENCOUNTER — Ambulatory Visit: Payer: Medicare HMO | Admitting: Family Medicine

## 2018-04-27 VITALS — BP 131/75 | HR 73 | Temp 98.1°F | Ht 64.0 in | Wt 190.0 lb

## 2018-04-27 DIAGNOSIS — G8929 Other chronic pain: Secondary | ICD-10-CM | POA: Diagnosis not present

## 2018-04-27 DIAGNOSIS — M25561 Pain in right knee: Secondary | ICD-10-CM | POA: Diagnosis not present

## 2018-04-27 MED ORDER — TRAMADOL HCL 50 MG PO TABS: 50.0000 mg | ORAL_TABLET | Freq: Two times a day (BID) | ORAL | 0 refills | Status: DC | PRN

## 2018-04-27 NOTE — Progress Notes (Signed)
Subjective: CC: knee pain PCP: Nancy Herb, MD MPN:[id number] Nancy Mccormick is a 84 y.o. female presenting to clinic today for:  1. Knee pain Patient reports several months history of right knee pain that seems to have worsened, particularly at night over the last several weeks.  She states that pain radiates from need to lower extremity and to hip.  Denies any substantial swelling or discoloration.  She has been using Tylenol with little improvement in symptoms.  She actually saw her PCP on 03/31/2018 and had a corticosteroid injection.  She notes that this did not help at all.  She is currently anticoagulated with Eliquis and is unable to take oral NSAIDs.  She has not been using any topical therapies for right knee pain.  She does note that it tends to interfere with her sleep because it is so severe.  Past medical history is negative for seizure disorder or renal dysfunction.  She is used tramadol in the past and tolerated this without difficulty.  Surgical history is significant for torn meniscus and fracture of the patella on the right knee.   ROS: Per HPI  Allergies  Allergen Reactions  . Shellfish Allergy Itching, Swelling and Rash  . Atorvastatin Other (See Comments)    myalgias   . Crestor [Rosuvastatin] Other (See Comments)    Myalgias   . Livalo [Pitavastatin] Other (See Comments)    myalgias   Past Medical History:  Diagnosis Date  . Arthritis    back, left shoulder, and fingers  . Atrial fibrillation (Nancy Mccormick)   . Hyperlipidemia   . Hypertension   . Osteoporosis     Current Outpatient Medications:  .  acetaminophen (TYLENOL) 500 MG tablet, Take 1,000 mg by mouth every 6 (six) hours as needed for mild pain or moderate pain., Disp: , Rfl:  .  Calcium Carbonate-Vitamin D (CALCIUM + D PO), Take 1 tablet by mouth at bedtime., Disp: , Rfl:  .  diltiazem (CARDIZEM CD) 180 MG 24 hr capsule, TAKE ONE (1) CAPSULE EACH DAY, Disp: 90 capsule, Rfl: 0 .  ELIQUIS 5 MG TABS tablet, TAKE  ONE TABLET BY MOUTH TWICE DAILY, Disp: 60 tablet, Rfl: 2 .  Multiple Vitamin (MULTIVITAMIN WITH MINERALS) TABS tablet, Take 1 tablet by mouth at bedtime., Disp: , Rfl:  .  nitroGLYCERIN (NITROSTAT) 0.4 MG SL tablet, Place 1 tablet (0.4 mg total) under the tongue as needed., Disp: 25 tablet, Rfl: 11 .  omega-3 acid ethyl esters (LOVAZA) 1 g capsule, TAKE ONE CAPSULE BY MOUTH TWICE A DAY, Disp: 120 capsule, Rfl: 1 .  Psyllium (NAT-RUL PSYLLIUM SEED HUSKS) 500 MG CAPS, Take 2 capsules by mouth daily., Disp: , Rfl:  .  rosuvastatin (CRESTOR) 5 MG tablet, Take 0.5 tablets (2.5 mg total) by mouth 2 (two) times a week., Disp: 12 tablet, Rfl: 1 Social History   Socioeconomic History  . Marital status: Widowed    Spouse name: Not on file  . Number of children: 3  . Years of education: Not on file  . Highest education level: Not on file  Occupational History  . 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 Financial resource strain: Not very hard  . Food insecurity:    Worry: Never true    Inability: Never true  . Transportation needs:    Medical: No    Non-medical: No  Tobacco Use  . Smoking status: Former Smoker    Types: Cigarettes  Last attempt to quit: 12/17/1986    Years since quitting: 31.3  . Smokeless tobacco: Never Used  . Tobacco comment: Smoke briefly in the distant past  Substance and Sexual Activity  . Alcohol use: No  . Drug use: No  . Sexual activity: Not Currently  Lifestyle  . Physical activity:    Days per week: 0 days    Minutes per session: Not on file  . Stress: Rather much  Relationships  . Social connections:    Talks on phone: More than three times a week    Gets together: More than three times a week    Attends religious service: More than 4 times per year    Active member of club or organization: No    Attends meetings of clubs or organizations: Never    Relationship status: Married  . Intimate partner  violence:    Fear of current or ex partner: No    Emotionally abused: No    Physically abused: No    Forced sexual activity: No  Other Topics Concern  . Not on file  Social History Narrative  . Not on file   Family History  Problem Relation Age of Onset  . Stroke Mother 38  . Anemia Mother   . Heart disease Mother   . Stroke Father 62  . Stroke Sister 70  . Hypertension Son     Objective: Office vital signs reviewed. BP 131/75   Pulse 73   Temp 98.1 F (36.7 C) (Oral)   Ht 5\' 4"  (1.626 m)   Wt 190 lb (86.2 kg)   BMI 32.61 kg/m   Physical Examination:  General: Awake, alert, well nourished, No acute distress Extremities: warm, well perfused, No edema, cyanosis or clubbing; +2 pulses bilaterally MSK: normal gait and normal station  Right knee: No appreciable effusion or discoloration.  No tenderness to palpation to the patella, patellar tendon or quad tendon.  Minimal tenderness to palpation to the medial joint line.  No palpable posterior popliteal masses.  No ligamentous laxity.  No palpable cords within the calf.  Negative Homans. Neuro: RLE light touch sensation grossly intact  Assessment/ Plan: 84 y.o. female   1. Chronic pain of right knee I suspect this is uncontrolled osteoarthritis.  Patient is not a candidate for oral NSAIDs.  Symptoms are refractory to corticosteroid injection.  We discussed that x-rays could be obtained but likely would offer little information.  Given history of injury to the knee I suspect that these are probably degenerative changes.  We will give a small quantity of tramadol to see if this might help.  She is to follow-up with PCP for refills if effective.  Otherwise, could consider referral to orthopedics for further evaluation and management.   The Narcotic Database has been reviewed.  There were no red flags.    Meds ordered this encounter  Medications  . traMADol (ULTRAM) 50 MG tablet    Sig: Take 1 tablet (50 mg total) by mouth  every 12 (twelve) hours as needed for severe pain.    Dispense:  10 tablet    Refill:  0     Nancy Oswald Windell Moulding, DO [facility] [phone]

## 2018-04-27 NOTE — Patient Instructions (Signed)
What You Need to Know About Osteoarthritis Osteoarthritis is a type of arthritis that affects tissue that covers the ends of bones in joints (cartilage). Cartilage acts as a cushion between the bones and helps them move smoothly. Osteoarthritis results when cartilage in the joints gets worn down. Osteoarthritis is sometimes called "wear and tear" arthritis. Osteoarthritis can affect any joint and can make movement painful. Hips, knees, fingers, and toes are some of the joints that are most often affected by osteoarthritis. You may be more likely to develop osteoarthritis if:  You are middle-aged or older.  You are obese.  You have injured a joint or had surgery on a joint.  You have a family history of osteoarthritis.  How can osteoarthritis affect me? Osteoarthritis can cause:  Pain and swelling in your joint.  Difficulty moving your joint.  A grating or scraping feeling inside the joint when you move it.  Popping or creaking sounds when you move.  This condition can make it harder to do things that you need or want to do each day. Osteoarthritis in a major joint, such as your knee or hip, can make it painful to walk or exercise. If you have osteoarthritis in your hands, you might not be able to grip items, twist your hand, or control small movements of your hands and fingers (fine motor skills). Over time, osteoarthritis could cause you to be less physically active. Being less active increases your risk for other long-term (chronic) health problems, such as diabetes and heart disease. What lifestyle changes can be made? You can lessen the impact that osteoarthritis has on your daily life by:  Switching to low-impact activities that do not put repeated pressure on your joints. For example, if you usually run or jog for exercise, try swimming or riding a bike instead.  Staying active. Build up to at least 150 minutes of physical activity each week to keep your joints healthy and keep  your body strong.  Losing weight. If you are overweight or obese, losing weight can take pressure off of your joints. If you need help with weight loss, talk with your health care provider or a diet and nutrition specialist (dietitian).  What other changes can be made? You can also lessen the effect of osteoarthritis by:  Using assistive devices. Sometimes a brace, wrap, splint, specialized glove, or cane can help. Talk with your health care provider or physical therapist about when and how to use these.  Working with a physical therapist who can help you find ways to do your daily activities without harming your joints. A physical therapist can also teach you exercises and stretches to strengthen the muscles that support your joints.  Treating pain and inflammation. Take over-the-counter and prescription medicines for pain and inflammation only as told by your health care provider. If directed, you may put ice on the affected joint: ? If you have a removable assistive device, remove it as told by your health care provider. ? Put ice in a plastic bag. ? Place a towel between your skin and the bag. ? Leave the ice on for 20 minutes, 2-3 times a day.  If other measures do not work, you may need joint surgery, such as joint replacement. What can happen if changes are not made? Osteoarthritis is a condition that gets worse over time (a progressive condition). If you do not take steps to strengthen your body and to slow down the progress of the disease, your condition may get worse more  quickly. Your joints may stiffen and become swollen, which will make them painful and hard to move. Where to find more information: You can learn more about osteoarthritis from:  [place]: www.RadioSc[employer].is  [employer] of Arthritis and Musculoskeletal and Skin Diseases: www.niams.CityPerson.tn  Contact a  health care provider if:  You cannot do your normal activities comfortably.  Your joint does not function at all.  Your pain is interfering with your sleep.  You are gaining weight.  Your joint appears to be changing in shape, instead of just being swollen and sore. Summary  Osteoarthritis is a painful joint disease that gets worse over time.  This condition can lead to other long-term (chronic) health problems.  There are changes that you can make to slow down the progression of the disease. This information is not intended to replace advice given to you by your health care provider. Make sure you discuss any questions you have with your health care provider. Document Released: 05/13/2016 Document Revised: 05/15/2016 Document Reviewed: 05/13/2016 Elsevier Interactive Patient Education  2018 [employer].

## 2018-04-27 NOTE — Telephone Encounter (Signed)
appt made

## 2018-05-17 ENCOUNTER — Encounter: Payer: Medicare HMO | Admitting: Family Medicine

## 2018-05-17 ENCOUNTER — Other Ambulatory Visit: Payer: Self-pay | Admitting: *Deleted

## 2018-05-17 ENCOUNTER — Ambulatory Visit (INDEPENDENT_AMBULATORY_CARE_PROVIDER_SITE_OTHER): Payer: Medicare HMO

## 2018-05-17 ENCOUNTER — Encounter: Payer: Self-pay | Admitting: Family Medicine

## 2018-05-17 DIAGNOSIS — M25561 Pain in right knee: Secondary | ICD-10-CM

## 2018-05-17 DIAGNOSIS — M1711 Unilateral primary osteoarthritis, right knee: Secondary | ICD-10-CM | POA: Diagnosis not present

## 2018-05-17 NOTE — Progress Notes (Signed)
mb ref

## 2018-05-17 NOTE — Progress Notes (Deleted)
   Subjective:    Patient ID: Nancy Mccormick, female    DOB: 03-Sep-1934, 84 y.o.   MRN: [id number]  HPI  Patient here today for 3-4 week follow up on right knee pain.     Patient Active Problem List   Diagnosis Date Noted  . Statin intolerance 09/01/2017  . CVA (cerebral vascular accident) ([place]) 08/30/2017  . Acute medial meniscal tear 07/03/2015  . Osteopenia 04/19/2014  . Palpitations 11/04/2013  . Arrhythmia 11/04/2013  . FIBRILLATION, ATRIAL 09/11/2010  . Dyslipidemia (high LDL; low HDL) 09/10/2010  . Essential hypertension 09/10/2010   Outpatient Encounter Medications as of 05/17/2018  Medication Sig  . acetaminophen (TYLENOL) 500 MG tablet Take 1,000 mg by mouth every 6 (six) hours as needed for mild pain or moderate pain.  . Calcium Carbonate-Vitamin D (CALCIUM + D PO) Take 1 tablet by mouth at bedtime.  Marland Kitchen diltiazem (CARDIZEM CD) 180 MG 24 hr capsule TAKE ONE (1) CAPSULE EACH DAY  . ELIQUIS 5 MG TABS tablet TAKE ONE TABLET BY MOUTH TWICE DAILY  . Multiple Vitamin (MULTIVITAMIN WITH MINERALS) TABS tablet Take 1 tablet by mouth at bedtime.  . nitroGLYCERIN (NITROSTAT) 0.4 MG SL tablet Place 1 tablet (0.4 mg total) under the tongue as needed.  Marland Kitchen omega-3 acid ethyl esters (LOVAZA) 1 g capsule TAKE ONE CAPSULE BY MOUTH TWICE A DAY  . Psyllium (NAT-RUL PSYLLIUM SEED HUSKS) 500 MG CAPS Take 2 capsules by mouth daily.  . rosuvastatin (CRESTOR) 5 MG tablet Take 0.5 tablets (2.5 mg total) by mouth 2 (two) times a week.  . traMADol (ULTRAM) 50 MG tablet Take 1 tablet (50 mg total) by mouth every 12 (twelve) hours as needed for severe pain.   No facility-administered encounter medications on file as of 05/17/2018.      Review of Systems  Constitutional: Negative.   HENT: Negative.   Eyes: Negative.   Respiratory: Negative.   Cardiovascular: Negative.   Gastrointestinal: Negative.   Endocrine: Negative.   Genitourinary: Negative.   Musculoskeletal: Positive for arthralgias  (right knee pain ).  Skin: Negative.   Allergic/Immunologic: Negative.   Neurological: Negative.   Hematological: Negative.   Psychiatric/Behavioral: Negative.        Objective:   Physical Exam   BP (!) 150/83 (BP Location: Left Wrist)   Pulse 61   Temp 97.7 F (36.5 C) (Oral)   Ht 5\' 4"  (1.626 m)   Wt 189 lb (85.7 kg)   BMI 32.44 kg/m       Assessment & Plan:

## 2018-05-17 NOTE — Progress Notes (Signed)
This encounter was created in error - please disregard.

## 2018-05-20 DIAGNOSIS — M25561 Pain in right knee: Secondary | ICD-10-CM | POA: Diagnosis not present

## 2018-06-03 DIAGNOSIS — Z1231 Encounter for screening mammogram for malignant neoplasm of breast: Secondary | ICD-10-CM | POA: Diagnosis not present

## 2018-06-03 LAB — HM MAMMOGRAPHY

## 2018-06-05 ENCOUNTER — Encounter (HOSPITAL_COMMUNITY): Payer: Self-pay

## 2018-06-05 ENCOUNTER — Observation Stay (HOSPITAL_COMMUNITY)
Admission: EM | Admit: 2018-06-05 | Discharge: 2018-06-06 | Disposition: A | Discharge status: Home / Self Care | Payer: Medicare HMO | Attending: Internal Medicine | Admitting: Internal Medicine

## 2018-06-05 ENCOUNTER — Other Ambulatory Visit: Payer: Self-pay

## 2018-06-05 ENCOUNTER — Emergency Department (HOSPITAL_COMMUNITY): Payer: Medicare HMO

## 2018-06-05 DIAGNOSIS — R778 Other specified abnormalities of plasma proteins: Secondary | ICD-10-CM | POA: Diagnosis not present

## 2018-06-05 DIAGNOSIS — I10 Essential (primary) hypertension: Secondary | ICD-10-CM | POA: Insufficient documentation

## 2018-06-05 DIAGNOSIS — R7989 Other specified abnormal findings of blood chemistry: Secondary | ICD-10-CM

## 2018-06-05 DIAGNOSIS — I480 Paroxysmal atrial fibrillation: Secondary | ICD-10-CM | POA: Diagnosis not present

## 2018-06-05 DIAGNOSIS — R079 Chest pain, unspecified: Secondary | ICD-10-CM | POA: Diagnosis not present

## 2018-06-05 DIAGNOSIS — Z7901 Long term (current) use of anticoagulants: Secondary | ICD-10-CM | POA: Diagnosis not present

## 2018-06-05 DIAGNOSIS — R42 Dizziness and giddiness: Secondary | ICD-10-CM | POA: Diagnosis not present

## 2018-06-05 DIAGNOSIS — R748 Abnormal levels of other serum enzymes: Secondary | ICD-10-CM | POA: Diagnosis not present

## 2018-06-05 DIAGNOSIS — R000 Tachycardia, unspecified: Secondary | ICD-10-CM | POA: Diagnosis not present

## 2018-06-05 DIAGNOSIS — I4891 Unspecified atrial fibrillation: Principal | ICD-10-CM | POA: Diagnosis present

## 2018-06-05 DIAGNOSIS — R002 Palpitations: Secondary | ICD-10-CM | POA: Diagnosis present

## 2018-06-05 DIAGNOSIS — R0602 Shortness of breath: Secondary | ICD-10-CM | POA: Diagnosis not present

## 2018-06-05 DIAGNOSIS — Z7982 Long term (current) use of aspirin: Secondary | ICD-10-CM | POA: Insufficient documentation

## 2018-06-05 DIAGNOSIS — Z87891 Personal history of nicotine dependence: Secondary | ICD-10-CM | POA: Diagnosis not present

## 2018-06-05 DIAGNOSIS — E785 Hyperlipidemia, unspecified: Secondary | ICD-10-CM | POA: Insufficient documentation

## 2018-06-05 DIAGNOSIS — R069 Unspecified abnormalities of breathing: Secondary | ICD-10-CM | POA: Diagnosis not present

## 2018-06-05 DIAGNOSIS — R0902 Hypoxemia: Secondary | ICD-10-CM | POA: Diagnosis not present

## 2018-06-05 DIAGNOSIS — Z79899 Other long term (current) drug therapy: Secondary | ICD-10-CM | POA: Insufficient documentation

## 2018-06-05 LAB — COMPREHENSIVE METABOLIC PANEL
ALT: 18 U/L (ref 0–44)
AST: 17 U/L (ref 15–41)
Albumin: 3.5 g/dL (ref 3.5–5.0)
Alkaline Phosphatase: 65 U/L (ref 38–126)
Anion gap: 10 (ref 5–15)
BILIRUBIN TOTAL: 0.8 mg/dL (ref 0.3–1.2)
BUN: 13 mg/dL (ref 8–23)
CHLORIDE: 109 mmol/L (ref 98–111)
CO2: 24 mmol/L (ref 22–32)
Calcium: 8.5 mg/dL — ABNORMAL LOW (ref 8.9–10.3)
Creatinine, Ser: 0.73 mg/dL (ref 0.44–1.00)
Glucose, Bld: 117 mg/dL — ABNORMAL HIGH (ref 70–99)
POTASSIUM: 3.6 mmol/L (ref 3.5–5.1)
Sodium: 143 mmol/L (ref 135–145)
TOTAL PROTEIN: 6.4 g/dL — AB (ref 6.5–8.1)

## 2018-06-05 LAB — CBC WITH DIFFERENTIAL/PLATELET
Basophils Absolute: 0 10*3/uL (ref 0.0–0.1)
Basophils Relative: 0 %
EOS ABS: 0.1 10*3/uL (ref 0.0–0.7)
Eosinophils Relative: 1 %
HCT: 41.7 % (ref 36.0–46.0)
HEMOGLOBIN: 13.6 g/dL (ref 12.0–15.0)
LYMPHS ABS: 2 10*3/uL (ref 0.7–4.0)
LYMPHS PCT: 21 %
MCH: 30.4 pg (ref 26.0–34.0)
MCHC: 32.6 g/dL (ref 30.0–36.0)
MCV: 93.1 fL (ref 78.0–100.0)
Monocytes Absolute: 0.7 10*3/uL (ref 0.1–1.0)
Monocytes Relative: 7 %
NEUTROS ABS: 6.9 10*3/uL (ref 1.7–7.7)
Neutrophils Relative %: 71 %
Platelets: 341 10*3/uL (ref 150–400)
RBC: 4.48 MIL/uL (ref 3.87–5.11)
RDW: 14.1 % (ref 11.5–15.5)
WBC: 9.8 10*3/uL (ref 4.0–10.5)

## 2018-06-05 LAB — TROPONIN I
TROPONIN I: 0.1 ng/mL — AB (ref <0.03)
TROPONIN I: 0.14 ng/mL — AB (ref <0.03)
Troponin I: 0.03 ng/mL (ref <0.03)

## 2018-06-05 LAB — MAGNESIUM: MAGNESIUM: 2.1 mg/dL (ref 1.7–2.4)

## 2018-06-05 LAB — PROTIME-INR
INR: 1.09
PROTHROMBIN TIME: 14 s (ref 11.4–15.2)

## 2018-06-05 MED ORDER — ONDANSETRON HCL 4 MG/2ML IJ SOLN: 4.0000 mg | Freq: Four times a day (QID) | INTRAMUSCULAR | Status: DC | PRN

## 2018-06-05 MED ORDER — SODIUM CHLORIDE 0.9 % IV BOLUS
500.0000 mL | Freq: Once | INTRAVENOUS | Status: AC
  Administered 2018-06-05: 500 mL via INTRAVENOUS

## 2018-06-05 MED ORDER — DILTIAZEM HCL ER COATED BEADS 240 MG PO CP24
240.0000 mg | ORAL_CAPSULE | Freq: Every day | ORAL | Status: DC
  Administered 2018-06-05 – 2018-06-06 (×2): 240 mg via ORAL
  Filled 2018-06-05 (×5): qty 1

## 2018-06-05 MED ORDER — ACETAMINOPHEN 650 MG RE SUPP: 650.0000 mg | Freq: Four times a day (QID) | RECTAL | Status: DC | PRN

## 2018-06-05 MED ORDER — POTASSIUM CHLORIDE CRYS ER 20 MEQ PO TBCR
20.0000 meq | EXTENDED_RELEASE_TABLET | Freq: Once | ORAL | Status: AC
  Administered 2018-06-05: 20 meq via ORAL
  Filled 2018-06-05: qty 1

## 2018-06-05 MED ORDER — APIXABAN 5 MG PO TABS
5.0000 mg | ORAL_TABLET | Freq: Two times a day (BID) | ORAL | Status: DC
  Administered 2018-06-05 – 2018-06-06 (×2): 5 mg via ORAL
  Filled 2018-06-05 (×2): qty 1

## 2018-06-05 MED ORDER — DILTIAZEM HCL-DEXTROSE 100-5 MG/100ML-% IV SOLN (PREMIX)
5.0000 mg/h | Freq: Once | INTRAVENOUS | Status: AC
  Administered 2018-06-05: 5 mg/h via INTRAVENOUS
  Filled 2018-06-05: qty 100

## 2018-06-05 MED ORDER — ACETAMINOPHEN 325 MG PO TABS: 650.0000 mg | ORAL_TABLET | Freq: Four times a day (QID) | ORAL | Status: DC | PRN

## 2018-06-05 MED ORDER — SODIUM CHLORIDE 0.9 % IV SOLN
INTRAVENOUS | Status: DC
  Administered 2018-06-05: 75 mL/h via INTRAVENOUS

## 2018-06-05 MED ORDER — NITROGLYCERIN 0.4 MG SL SUBL: 0.4000 mg | SUBLINGUAL_TABLET | SUBLINGUAL | Status: DC | PRN

## 2018-06-05 MED ORDER — ONDANSETRON HCL 4 MG PO TABS: 4.0000 mg | ORAL_TABLET | Freq: Four times a day (QID) | ORAL | Status: DC | PRN

## 2018-06-05 MED ORDER — ROSUVASTATIN CALCIUM 5 MG PO TABS: 2.5000 mg | ORAL_TABLET | ORAL | Status: DC

## 2018-06-05 NOTE — Discharge Instructions (Addendum)
Return for rapid heart rate lasting 40 minutes or longer.  Make sure you continue to take your blood thinner.  Also continue to take your cardizem as directed.  Make an appointment to follow-up with cardiology due to today's rapid atrial fibrillation event.  Return for any new or worse symptoms.

## 2018-06-05 NOTE — ED Notes (Signed)
Date and time results received: 06/05/18 4:07 PM  (use smartphrase ".now" to insert current time)  Test: Troponin Critical Value: 0.10  Name of Provider Notified: Zackowski  Orders Received? Or Actions Taken?: Orders Received - See Orders for details

## 2018-06-05 NOTE — ED Notes (Signed)
Pt assisted with urinal.  HR slowed to 70s but still afib after urinating.  Diltiazem not increased at this time.  Will continue to monitor.

## 2018-06-05 NOTE — ED Triage Notes (Signed)
Pt woke [facility] this morning feeling dizzy and says heart felt "funny."  Pt took 2 reg strength asa and 2 nitro without relief.  Pt also took her eloquis.  EMS reports pt got more lightheaded after nitro.  EMS arrived and bp was 97/53 and HR 130's.  EMS gave 20mg  cardizem iv.  Pt still afib but hr decreased to 69.  BP 92/64.  Pt says heart doesn;t feel funny after her heart rate slowed down but still reports dizziness and lightheadedness.

## 2018-06-05 NOTE — ED Provider Notes (Addendum)
[facility] EMERGENCY DEPARTMENT Provider Note   CSN: [id number] Arrival date & time: 06/05/18  1054     History   Chief Complaint Chief Complaint  Patient presents with  . Atrial Fibrillation    HPI Nancy Mccormick is a 84 y.o. female.  Patient brought in by EMS.  Patient states that when she woke up this morning at about 7 she felt funny.  Thought maybe her heart was beating fast.  Felt a little lightheaded and dizzy no true vertigo.  Patient took 2 regular strength aspirin and 2 nitro without any relief.  Patient did not have any true chest pain but did have indigestion.  Patient has a known history of atrial fibrillation.  She is on Eliquis.  Patient felt more lightheaded after the nitroglycerin.  EMS arrived she did not call them until later in the morning because her arrival time here was about 11 AM.  EMS noted her blood pressure was 97 systolic and heart rate was 130s and irregular.  EMS called in to give some IV cardia exam and patient received 20 mg.  EMS stated that this helped bring her heart rate down to into the 60s.  But was still in atrial fib.  Blood pressure according to them was 92/64.  Patient arrived here with her heart rate jumping anywhere from 90s up to 140s.  Initial blood pressure was 91/60.  Oxygen saturations were 98%.     Past Medical History:  Diagnosis Date  . Arthritis    back, left shoulder, and fingers  . Atrial fibrillation ([place])   . Hyperlipidemia   . Hypertension   . Osteoporosis     Patient Active Problem List   Diagnosis Date Noted  . Statin intolerance 09/01/2017  . CVA (cerebral vascular accident) ([place]) 08/30/2017  . Acute medial meniscal tear 07/03/2015  . Osteopenia 04/19/2014  . Palpitations 11/04/2013  . Arrhythmia 11/04/2013  . FIBRILLATION, ATRIAL 09/11/2010  . Dyslipidemia (high LDL; low HDL) 09/10/2010  . Essential hypertension 09/10/2010    Past Surgical History:  Procedure Laterality Date  . CATARACT EXTRACTION    .  KNEE ARTHROSCOPY Right 07/04/2015   Procedure: RIGHT KNEE ARTHROSCOPY WITH MEDIAL MENISCAL DEBRIDEMENT AND CHONDROPLASTY;  Surgeon: Gaynelle Arabian, MD;  Location: [facility];  Service: Orthopedics;  Laterality: Right;  . OPEN REDUCTION INTERNAL FIXATION (ORIF) DISTAL RADIAL FRACTURE Right 12/28/2012   Procedure: OPEN REDUCTION INTERNAL FIXATION (ORIF) DISTAL RADIAL FRACTURE;  Surgeon: Hessie Dibble, MD;  Location: [place];  Service: Orthopedics;  Laterality: Right;  Marland Kitchen VAGINAL HYSTERECTOMY       OB History   None      Home Medications    Prior to Admission medications   Medication Sig Start Date End Date Taking? Authorizing Provider  acetaminophen (TYLENOL) 500 MG tablet Take 1,000 mg by mouth every 6 (six) hours as needed for mild pain or moderate pain.   Yes [provider]  aspirin 325 MG tablet Take 650 mg by mouth once.   Yes [provider]  Calcium Carbonate-Vitamin D (CALCIUM + D PO) Take 1 tablet by mouth at bedtime.   Yes [provider]  diltiazem (CARDIZEM CD) 180 MG 24 hr capsule TAKE ONE (1) CAPSULE EACH DAY 03/03/18  Yes Chipper Herb, MD  ELIQUIS 5 MG TABS tablet TAKE ONE TABLET BY MOUTH TWICE DAILY 04/26/18  Yes Chipper Herb, MD  Multiple Vitamin (MULTIVITAMIN WITH MINERALS) TABS tablet Take 1 tablet by mouth at bedtime.  Yes [provider]  nitroGLYCERIN (NITROSTAT) 0.4 MG SL tablet Place 1 tablet (0.4 mg total) under the tongue as needed. 06/20/14  Yes Chipper Herb, MD  omega-3 acid ethyl esters (LOVAZA) 1 g capsule TAKE ONE CAPSULE BY MOUTH TWICE A DAY 04/12/18  Yes Chipper Herb, MD  Psyllium (NAT-RUL PSYLLIUM SEED HUSKS) 500 MG CAPS Take 2 capsules by mouth daily.   Yes [provider]  rosuvastatin (CRESTOR) 5 MG tablet Take 0.5 tablets (2.5 mg total) by mouth 2 (two) times a week. 04/01/18 06/30/18  Chipper Herb, MD  traMADol (ULTRAM) 50 MG tablet Take 1 tablet (50 mg total) by mouth every 12 (twelve)  hours as needed for severe pain. Patient not taking: Reported on 06/05/2018 04/27/18   Janora Norlander, DO    Family History Family History  Problem Relation Age of Onset  . Stroke Mother 67  . Anemia Mother   . Heart disease Mother   . Stroke Father 69  . Stroke Sister 46  . Hypertension Son     Social History Social History   Tobacco Use  . Smoking status: Former Smoker    Types: Cigarettes    Last attempt to quit: 12/17/1986    Years since quitting: 31.4  . Smokeless tobacco: Never Used  . Tobacco comment: Smoke briefly in the distant past  Substance Use Topics  . Alcohol use: No  . Drug use: No     Allergies   Shellfish allergy; Atorvastatin; Crestor [rosuvastatin]; and Livalo [pitavastatin]   Review of Systems Review of Systems  Constitutional: Negative for fever.  HENT: Negative for congestion.   Eyes: Negative for redness.  Respiratory: Positive for shortness of breath.   Cardiovascular: Positive for chest pain and palpitations. Negative for leg swelling.  Gastrointestinal: Negative for abdominal pain, nausea and vomiting.  Genitourinary: Negative for dysuria.  Musculoskeletal: Negative for back pain.  Skin: Negative for rash.  Neurological: Positive for dizziness and light-headedness. Negative for syncope.  Hematological: Does not bruise/bleed easily.  Psychiatric/Behavioral: Negative for confusion.     Physical Exam Updated Vital Signs BP 134/62   Pulse 70   Temp 98.7 F (37.1 C) (Oral)   Resp 16   Ht 1.626 m (5\' 4" )   Wt 81.6 kg   SpO2 98%   BMI 30.90 kg/m   Physical Exam  Constitutional: She is oriented to person, place, and time. She appears well-developed and well-nourished. No distress.  HENT:  Head: Normocephalic and atraumatic.  Mouth/Throat: Oropharynx is clear and moist.  Eyes: Conjunctivae and EOM are normal.  Neck: Neck supple.  Cardiovascular:  Irregular rapid heart rate  Pulmonary/Chest: Effort normal and breath sounds  normal. No respiratory distress.  Abdominal: Soft. Bowel sounds are normal. There is no tenderness.  Musculoskeletal: She exhibits no edema.  Neurological: She is alert and oriented to person, place, and time. No cranial nerve deficit or sensory deficit. She exhibits normal muscle tone. Coordination normal.  Skin: Skin is warm. No rash noted.  Nursing note and vitals reviewed.    ED Treatments / Results  Labs (all labs ordered are listed, but only abnormal results are displayed) Labs Reviewed  COMPREHENSIVE METABOLIC PANEL - Abnormal; Notable for the following components:      Result Value   Glucose, Bld 117 (*)    Calcium 8.5 (*)    Total Protein 6.4 (*)    All other components within normal limits  TROPONIN I - Abnormal; Notable for the following  components:   Troponin I 0.03 (*)    All other components within normal limits  CBC WITH DIFFERENTIAL/PLATELET  PROTIME-INR  TROPONIN I    EKG EKG Interpretation  Date/Time:  Saturday June 05 2018 12:08:16 EDT Ventricular Rate:  60 PR Interval:    QRS Duration: 111 QT Interval:  432 QTC Calculation: 432 R Axis:   39 Text Interpretation:  Sinus rhythm Low voltage, precordial leads atrial fib resolved Confirmed by Fredia Sorrow [id number] on 06/05/2018 12:53:11 PM   Radiology Dg Chest Port 1 View  Result Date: 06/05/2018 CLINICAL DATA:  Palpitations with chest pain and shortness of breath. EXAM: PORTABLE CHEST 1 VIEW COMPARISON:  Chest x-ray dated November 11, 2016. FINDINGS: Stable mild cardiomegaly. Normal pulmonary vascularity. Atherosclerotic calcification of the aortic arch. Unchanged minimal scarring in the lingula. No focal consolidation, pleural effusion, or pneumothorax. No acute osseous abnormality. IMPRESSION: Stable cardiomegaly.  No active disease. Electronically Signed   By: Titus Dubin M.D.   On: 06/05/2018 11:36    Procedures Procedures (including critical care time)  CRITICAL CARE Performed by: Fredia Sorrow Total critical care time: 30 minutes Critical care time was exclusive of separately billable procedures and treating other patients. Critical care was necessary to treat or prevent imminent or life-threatening deterioration. Critical care was time spent personally by me on the following activities: development of treatment plan with patient and/or surrogate as well as nursing, discussions with consultants, evaluation of patient's response to treatment, examination of patient, obtaining history from patient or surrogate, ordering and performing treatments and interventions, ordering and review of laboratory studies, ordering and review of radiographic studies, pulse oximetry and re-evaluation of patient's condition.   Medications Ordered in ED Medications  0.9 %  sodium chloride infusion (75 mL/hr Intravenous New Bag/Given 06/05/18 1120)  sodium chloride 0.9 % bolus 500 mL (0 mLs Intravenous Stopped 06/05/18 1223)  diltiazem (CARDIZEM) 100 mg in dextrose 5% 165mL (1 mg/mL) infusion (0 mg/hr Intravenous Stopped 06/05/18 1226)     Initial Impression / Assessment and Plan / ED Course  I have reviewed the triage vital signs and the nursing notes.  Pertinent labs & imaging results that were available during my care of the patient were reviewed by me and considered in my medical decision making (see chart for details).    Patient's blood pressure shortly after arrival went up into the 120s.  Patient started on diltiazem drip because she still had rapid atrial fib on and off.  Patient tolerated the drip fine.  And then she spontaneously converted back to sinus rhythm.  Patient observed for period of time.  Remains in sinus rhythm blood pressures are fine patient asymptomatic.  However patient's initial troponin was slightly elevated at 0.03.  A 3-1/2-hour troponin has been ordered.  If negative patient can go home and follow-up with cardiology.  If more elevated patient will require admission  for rule out.  Patient's symptoms difficult to ascertain as being typical for heart attack the patient did take her nitro based on the symptoms.  Describes it more as heartburn chest pain.  Chest x-ray without any acute findings.  2 EKGs were done first 1 consistent with atrial fibrillation with RVR.  Second 1 showed sinus rhythm.  Patient is followed by cardiology and the [facility] area.  Dr. Percival Spanish is her cardiologist.  Hospitalist will admit here if troponins continue to climb we will transfer her to [facility].   Final Clinical Impressions(s) / ED Diagnoses   Final diagnoses:  Atrial fibrillation with RVR [facility])    ED Discharge Orders    None       Fredia Sorrow, MD 06/05/18 1526    Fredia Sorrow, MD 06/05/18 [phone]

## 2018-06-05 NOTE — ED Notes (Signed)
Pt ambulated to bathroom with no difficulty.  Maintained SR.

## 2018-06-05 NOTE — ED Notes (Signed)
Date and time results received: 06/05/18 12:32 PM  (use smartphrase ".now" to insert current time)  Test: Troponin Critical Value: 0.03  Name of Provider Notified: Zackowski  Orders Received? Or Actions Taken?: Orders Received - See Orders for details

## 2018-06-05 NOTE — ED Notes (Signed)
Pt converted to SR.  Repeat EKG done

## 2018-06-05 NOTE — H&P (Signed)
History and Physical    Nancy Mccormick:[id number] DOB: [birth year] DOA: 06/05/2018  PCP: Chipper Herb, MD  Patient coming from: Home  I have personally briefly reviewed patient's old medical records in [place]  Chief Complaint: Palpitations  HPI: Nancy Mccormick is a 84 y.o. female with medical history significant of paroxysmal atrial fibrillation on anticoagulation, previous stroke, presents to the hospital with complaints of palpitations.  Patient was in her usual state of health when she woke up this morning and was feeling her heart racing/pounding.  She had associated shortness of breath.  She feels generally weak.  She did not have any nausea or vomiting.  She did complain of some feelings of indigestion.  She took 2 aspirin, but her symptoms did not resolve.  After a few hours, she took 2 nitroglycerin which did not help her symptoms.  Paramedics were called and she was noted to be in rapid atrial fibrillation.  She received Cardizem, but this did not improve her symptoms.  After arrival to the emergency room, she was started on a Cardizem infusion and subsequently converted to sinus rhythm.  When she converted, her palpitations, chest discomfort and shortness of breath resolved.  She is feeling significantly better and back to baseline.  Troponins were checked and his troponin was mildly elevated at 0.03, follow-up troponin was 0.1.  She is been referred for observation.  Review of Systems: As per HPI otherwise 10 point review of systems negative.    Past Medical History:  Diagnosis Date  . Arthritis    back, left shoulder, and fingers  . Atrial fibrillation ([place])   . Hyperlipidemia   . Hypertension   . Osteoporosis     Past Surgical History:  Procedure Laterality Date  . CATARACT EXTRACTION    . KNEE ARTHROSCOPY Right 07/04/2015   Procedure: RIGHT KNEE ARTHROSCOPY WITH MEDIAL MENISCAL DEBRIDEMENT AND CHONDROPLASTY;  Surgeon: Gaynelle Arabian, MD;  Location: [facility];   Service: Orthopedics;  Laterality: Right;  . OPEN REDUCTION INTERNAL FIXATION (ORIF) DISTAL RADIAL FRACTURE Right 12/28/2012   Procedure: OPEN REDUCTION INTERNAL FIXATION (ORIF) DISTAL RADIAL FRACTURE;  Surgeon: Hessie Dibble, MD;  Location: [facility];  Service: Orthopedics;  Laterality: Right;  Marland Kitchen VAGINAL HYSTERECTOMY      Social History:  reports that she quit smoking about 31 years ago. Her smoking use included cigarettes. She has never used smokeless tobacco. She reports that she does not drink alcohol or use drugs.  Allergies  Allergen Reactions  . Shellfish Allergy Itching, Swelling and Rash  . Atorvastatin Other (See Comments)    myalgias   . Crestor [Rosuvastatin] Other (See Comments)    Myalgias   . Livalo [Pitavastatin] Other (See Comments)    myalgias    Family History  Problem Relation Age of Onset  . Stroke Mother 64  . Anemia Mother   . Heart disease Mother   . Stroke Father 37  . Stroke Sister 71  . Hypertension Son      Prior to Admission medications   Medication Sig Start Date End Date Taking? Authorizing Provider  acetaminophen (TYLENOL) 500 MG tablet Take 1,000 mg by mouth every 6 (six) hours as needed for mild pain or moderate pain.   Yes [provider]  aspirin 325 MG tablet Take 650 mg by mouth once.   Yes [provider]  Calcium Carbonate-Vitamin D (CALCIUM + D PO) Take 1 tablet by mouth at bedtime.   Yes [provider]  diltiazem (CARDIZEM CD) 180 MG 24 hr capsule TAKE ONE (1) CAPSULE EACH DAY 03/03/18  Yes Chipper Herb, MD  ELIQUIS 5 MG TABS tablet TAKE ONE TABLET BY MOUTH TWICE DAILY 04/26/18  Yes Chipper Herb, MD  Multiple Vitamin (MULTIVITAMIN WITH MINERALS) TABS tablet Take 1 tablet by mouth at bedtime.   Yes [provider]  nitroGLYCERIN (NITROSTAT) 0.4 MG SL tablet Place 1 tablet (0.4 mg total) under the tongue as needed. 06/20/14  Yes Chipper Herb, MD  omega-3 acid ethyl esters  (LOVAZA) 1 g capsule TAKE ONE CAPSULE BY MOUTH TWICE A DAY 04/12/18  Yes Chipper Herb, MD  Psyllium (NAT-RUL PSYLLIUM SEED HUSKS) 500 MG CAPS Take 2 capsules by mouth daily.   Yes [provider]  rosuvastatin (CRESTOR) 5 MG tablet Take 0.5 tablets (2.5 mg total) by mouth 2 (two) times a week. 04/01/18 06/30/18  Chipper Herb, MD  traMADol (ULTRAM) 50 MG tablet Take 1 tablet (50 mg total) by mouth every 12 (twelve) hours as needed for severe pain. Patient not taking: Reported on 06/05/2018 04/27/18   Janora Norlander, DO    Physical Exam: Vitals:   06/05/18 1545 06/05/18 1600 06/05/18 1630 06/05/18 1728  BP:  127/66 122/67 136/74  Pulse: 60 84 69 63  Resp: 12 19 19 18   Temp:    98.3 F (36.8 C)  TempSrc:    Oral  SpO2: 98% 95% 95% 96%  Weight:      Height:        Constitutional: NAD, calm, comfortable Eyes: PERRL, lids and conjunctivae normal ENMT: Mucous membranes are moist. Posterior pharynx clear of any exudate or lesions.Normal dentition.  Neck: normal, supple, no masses, no thyromegaly Respiratory: clear to auscultation bilaterally, no wheezing, no crackles. Normal respiratory effort. No accessory muscle use.  Cardiovascular: Regular rate and rhythm, no murmurs / rubs / gallops. No extremity edema. 2+ pedal pulses. No carotid bruits.  Abdomen: no tenderness, no masses palpated. No hepatosplenomegaly. Bowel sounds positive.  Musculoskeletal: no clubbing / cyanosis. No joint deformity upper and lower extremities. Good ROM, no contractures. Normal muscle tone.  Skin: no rashes, lesions, ulcers. No induration Neurologic: CN 2-12 grossly intact. Sensation intact, DTR normal. Strength 5/5 in all 4.  Psychiatric: Normal judgment and insight. Alert and oriented x 3. Normal mood.    Labs on Admission: I have personally reviewed following labs and imaging studies  CBC: Recent Labs  Lab 06/05/18 1122  WBC 9.8  NEUTROABS 6.9  HGB 13.6  HCT 41.7  MCV 93.1  PLT 751    Basic Metabolic Panel: Recent Labs  Lab 06/05/18 1150  NA 143  K 3.6  CL 109  CO2 24  GLUCOSE 117*  BUN 13  CREATININE 0.73  CALCIUM 8.5*   GFR: Estimated Creatinine Clearance: 57 mL/min (by C-G formula based on SCr of 0.73 mg/dL). Liver Function Tests: Recent Labs  Lab 06/05/18 1150  AST 17  ALT 18  ALKPHOS 65  BILITOT 0.8  PROT 6.4*  ALBUMIN 3.5   No results for input(s): LIPASE, AMYLASE in the last 168 hours. No results for input(s): AMMONIA in the last 168 hours. Coagulation Profile: Recent Labs  Lab 06/05/18 1150  INR 1.09   Cardiac Enzymes: Recent Labs  Lab 06/05/18 1150 06/05/18 1529  TROPONINI 0.03* 0.10*   BNP (last 3 results) No results for input(s): PROBNP in the last 8760 hours. HbA1C: No results for input(s): HGBA1C in the last 72 hours. CBG:  No results for input(s): GLUCAP in the last 168 hours. Lipid Profile: No results for input(s): CHOL, HDL, LDLCALC, TRIG, CHOLHDL, LDLDIRECT in the last 72 hours. Thyroid Function Tests: No results for input(s): TSH, T4TOTAL, FREET4, T3FREE, THYROIDAB in the last 72 hours. Anemia Panel: No results for input(s): VITAMINB12, FOLATE, FERRITIN, TIBC, IRON, RETICCTPCT in the last 72 hours. Urine analysis:    Component Value Date/Time   COLORURINE YELLOW 08/30/2017 1941   APPEARANCEUR CLEAR 08/30/2017 1941   LABSPEC 1.014 08/30/2017 1941   PHURINE 7.0 08/30/2017 1941   GLUCOSEU NEGATIVE 08/30/2017 1941   HGBUR NEGATIVE 08/30/2017 [place] NEGATIVE 08/30/2017 1941   BILIRUBINUR neg 09/19/2014 1129   KETONESUR 5 (A) 08/30/2017 1941   PROTEINUR NEGATIVE 08/30/2017 1941   UROBILINOGEN negative 09/19/2014 1129   UROBILINOGEN 0.2 08/13/2010 1023   NITRITE NEGATIVE 08/30/2017 1941   LEUKOCYTESUR MODERATE (A) 08/30/2017 1941    Radiological Exams on Admission: Dg Chest Port 1 View  Result Date: 06/05/2018 CLINICAL DATA:  Palpitations with chest pain and shortness of breath. EXAM: PORTABLE  CHEST 1 VIEW COMPARISON:  Chest x-ray dated November 11, 2016. FINDINGS: Stable mild cardiomegaly. Normal pulmonary vascularity. Atherosclerotic calcification of the aortic arch. Unchanged minimal scarring in the lingula. No focal consolidation, pleural effusion, or pneumothorax. No acute osseous abnormality. IMPRESSION: Stable cardiomegaly.  No active disease. Electronically Signed   By: Titus Dubin M.D.   On: 06/05/2018 11:36    EKG: Independently reviewed.  Rapid atrial fibrillation, follow-up EKG shows normal sinus rhythm  Assessment/Plan Active Problems:   Dyslipidemia (high LDL; low HDL)   Essential hypertension   Atrial fibrillation with RVR (HCC)   Elevated troponin    1. Paroxysmal atrial fibrillation, presented with rapid A. fib.  Patient was experiencing palpitations this morning.  On arrival to the emergency room she was noted to be tachycardic in atrial fibrillation.  She received intravenous Cardizem and has since converted back to sinus rhythm.  She reports compliance with her Cardizem dosing and has not missed any dosages.  We will increase her dose from 180 mg to 240 mg.  She is anticoagulated with Eliquis. 2. Elevated troponin.  Since she is converted back to sinus rhythm, her palpitations, shortness of breath and chest discomfort have completely resolved.  She does not have any acute ischemic changes on EKG.  Mild elevation of troponin may be demand ischemia at this point.  She displayed to be further cycled.  If she has significant elevation of troponin, may need to discuss with cardiology.  If troponins remain flat, anticipate discharge home in the morning and follow-up with cardiology as an outpatient. 3. Hypertension.  Blood pressure currently stable.  Continue to monitor. 4. Hyperlipidemia.  Continue on home dose of statin.  DVT prophylaxis: Eliquis Code Status: Full code Family Communication: Discussed with son and grandson at the bedside Disposition Plan: Probable  discharge home tomorrow if stable overnight Consults called:   Admission status: Observation, telemetry  Kathie Dike MD [facility] Pager [phone]  If 7PM-7AM, please contact night-coverage [email or web address] Password [facility]  06/05/2018, 6:23 PM

## 2018-06-06 DIAGNOSIS — I4891 Unspecified atrial fibrillation: Secondary | ICD-10-CM | POA: Diagnosis not present

## 2018-06-06 DIAGNOSIS — R748 Abnormal levels of other serum enzymes: Secondary | ICD-10-CM | POA: Diagnosis not present

## 2018-06-06 DIAGNOSIS — I10 Essential (primary) hypertension: Secondary | ICD-10-CM | POA: Diagnosis not present

## 2018-06-06 DIAGNOSIS — E785 Hyperlipidemia, unspecified: Secondary | ICD-10-CM | POA: Diagnosis not present

## 2018-06-06 LAB — TROPONIN I
Troponin I: 0.13 ng/mL (ref <0.03)
Troponin I: 0.09 ng/mL (ref <0.03)

## 2018-06-06 MED ORDER — DILTIAZEM HCL ER COATED BEADS 240 MG PO CP24: 240.0000 mg | ORAL_CAPSULE | Freq: Every day | ORAL | 1 refills | Status: DC

## 2018-06-06 NOTE — Discharge Summary (Signed)
Physician Discharge Summary  Natalyah Cummiskey EXB:[id number] DOB: [birth year] DOA: 06/05/2018  PCP: Chipper Herb, MD  Admit date: 06/05/2018 Discharge date: 06/06/2018  Admitted From: home Disposition:  home  Recommendations for Outpatient Follow-up:  1. Follow up with PCP in 1-2 weeks 2. Please obtain BMP/CBC in one week 3. Follow up with cardiology in the next 2 weeks  Home Health: Equipment/Devices:  Discharge Condition:stable CODE STATUS:full code Diet recommendation: heart healthy  Brief/Interim Summary: This is an 84 year old female with a history of paroxysmal atrial fibrillation and prior stroke who presents to the hospital with palpitations and found to be in rapid atrial fib.  The patient was started on Cardizem infusion and spontaneously converted back to sinus rhythm.  Her home dose of Cardizem was increased from 180 mg at 240 mg.  She did not have any recurrence of atrial flutter heart rate has been stable.  She did have some palpitations, shortness of breath and some chest discomfort while she was in rapid atrial fib, but these resolved when she converted to sinus rhythm.  She did have a minor elevation of troponin with a peak of 0.14 which has since trended down.  This is likely related to demand ischemia in the setting of extreme tachycardia.  She has not had any further chest discomfort and had no ischemic EKG changes.  Patient is feeling significantly improved and is ready for discharge home.  She is advised to follow-up with her cardiologist.  Discharge Diagnoses:  Active Problems:   Dyslipidemia (high LDL; low HDL)   Essential hypertension   Atrial fibrillation with RVR (HCC)   Elevated troponin    Discharge Instructions  Discharge Instructions    Diet - low sodium heart healthy   Complete by:  As directed    Increase activity slowly   Complete by:  As directed      Allergies as of 06/06/2018      Reactions   Shellfish Allergy Itching, Swelling, Rash    Atorvastatin Other (See Comments)   myalgias   Crestor [rosuvastatin] Other (See Comments)   Myalgias   Livalo [pitavastatin] Other (See Comments)   myalgias      Medication List    STOP taking these medications   aspirin 325 MG tablet     TAKE these medications   acetaminophen 500 MG tablet Commonly known as:  TYLENOL Take 1,000 mg by mouth every 6 (six) hours as needed for mild pain or moderate pain.   CALCIUM + D PO Take 1 tablet by mouth at bedtime.   diltiazem 240 MG 24 hr capsule Commonly known as:  CARDIZEM CD Take 1 capsule (240 mg total) by mouth daily. Start taking on:  06/07/2018 What changed:    medication strength  See the new instructions.   ELIQUIS 5 MG Tabs tablet Generic drug:  apixaban TAKE ONE TABLET BY MOUTH TWICE DAILY   multivitamin with minerals Tabs tablet Take 1 tablet by mouth at bedtime.   NAT-RUL PSYLLIUM SEED HUSKS 500 MG Caps Generic drug:  Psyllium Take 2 capsules by mouth daily.   nitroGLYCERIN 0.4 MG SL tablet Commonly known as:  NITROSTAT Place 1 tablet (0.4 mg total) under the tongue as needed.   omega-3 acid ethyl esters 1 g capsule Commonly known as:  LOVAZA TAKE ONE CAPSULE BY MOUTH TWICE A DAY   rosuvastatin 5 MG tablet Commonly known as:  CRESTOR Take 0.5 tablets (2.5 mg total) by mouth 2 (two) times a week.   traMADol 50  MG tablet Commonly known as:  ULTRAM Take 1 tablet (50 mg total) by mouth every 12 (twelve) hours as needed for severe pain.      Follow-up Information    Schedule an appointment as soon as possible for a visit  with Chipper Herb, MD.   Specialty:  Family Medicine Why:  As needed Contact information: [place] [address] [phone]        Schedule an appointment as soon as possible for a visit  with Minus Breeding, MD.   Specialty:  Cardiology Contact information: [address] [place] [address] [phone]          Allergies  Allergen  Reactions  . Shellfish Allergy Itching, Swelling and Rash  . Atorvastatin Other (See Comments)    myalgias   . Crestor [Rosuvastatin] Other (See Comments)    Myalgias   . Livalo [Pitavastatin] Other (See Comments)    myalgias    Consultations:     Procedures/Studies: Dg Knee 1-2 Views Right  Result Date: 05/17/2018 CLINICAL DATA:  Right knee pain EXAM: RIGHT KNEE - 1-2 VIEW COMPARISON:  June 05, 2016 FINDINGS: No evidence of fracture, dislocation, or joint effusion. Mild decreased medial femoral tibial joint space is identified bilaterally. Soft tissues are unremarkable. IMPRESSION: No acute fracture or dislocation. Mild degenerative joint changes of both knees. Electronically Signed   By: Abelardo Diesel M.D.   On: 05/17/2018 14:03   Dg Chest Port 1 View  Result Date: 06/05/2018 CLINICAL DATA:  Palpitations with chest pain and shortness of breath. EXAM: PORTABLE CHEST 1 VIEW COMPARISON:  Chest x-ray dated November 11, 2016. FINDINGS: Stable mild cardiomegaly. Normal pulmonary vascularity. Atherosclerotic calcification of the aortic arch. Unchanged minimal scarring in the lingula. No focal consolidation, pleural effusion, or pneumothorax. No acute osseous abnormality. IMPRESSION: Stable cardiomegaly.  No active disease. Electronically Signed   By: Titus Dubin M.D.   On: 06/05/2018 11:36       Subjective: No chest pain, shortness of breath or palpitations  Discharge Exam: Vitals:   06/05/18 1728 06/05/18 2156 06/05/18 2210 06/06/18 0604  BP: 136/74 (!) 142/52 134/64 (!) 143/78  Pulse: 63 65  (!) 56  Resp: 18   18  Temp: 98.3 F (36.8 C) 97.9 F (36.6 C)  97.8 F (36.6 C)  TempSrc: Oral Oral  Oral  SpO2: 96% 97%  95%  Weight: 84.5 kg     Height: 5\' 4"  (1.626 m)       General: Pt is alert, awake, not in acute distress Cardiovascular: irregular, S1/S2 +, no rubs, no gallops Respiratory: CTA bilaterally, no wheezing, no rhonchi Abdominal: Soft, NT, ND, bowel sounds  + Extremities: no edema, no cyanosis    The results of significant diagnostics from this hospitalization (including imaging, microbiology, ancillary and laboratory) are listed below for reference.     Microbiology: No results found for this or any previous visit (from the past 240 hour(s)).   Labs: BNP (last 3 results) No results for input(s): BNP in the last 8760 hours. Basic Metabolic Panel: Recent Labs  Lab 06/05/18 1150 06/05/18 1909  NA 143  --   K 3.6  --   CL 109  --   CO2 24  --   GLUCOSE 117*  --   BUN 13  --   CREATININE 0.73  --   CALCIUM 8.5*  --   MG  --  2.1   Liver Function Tests: Recent Labs  Lab 06/05/18 1150  AST 17  ALT 18  ALKPHOS 65  BILITOT 0.8  PROT 6.4*  ALBUMIN 3.5   No results for input(s): LIPASE, AMYLASE in the last 168 hours. No results for input(s): AMMONIA in the last 168 hours. CBC: Recent Labs  Lab 06/05/18 1122  WBC 9.8  NEUTROABS 6.9  HGB 13.6  HCT 41.7  MCV 93.1  PLT 341   Cardiac Enzymes: Recent Labs  Lab 06/05/18 1150 06/05/18 1529 06/05/18 1909 06/06/18 0131 06/06/18 0822  TROPONINI 0.03* 0.10* 0.14* 0.13* 0.09*   BNP: Invalid input(s): POCBNP CBG: No results for input(s): GLUCAP in the last 168 hours. D-Dimer No results for input(s): DDIMER in the last 72 hours. Hgb A1c No results for input(s): HGBA1C in the last 72 hours. Lipid Profile No results for input(s): CHOL, HDL, LDLCALC, TRIG, CHOLHDL, LDLDIRECT in the last 72 hours. Thyroid function studies No results for input(s): TSH, T4TOTAL, T3FREE, THYROIDAB in the last 72 hours.  Invalid input(s): FREET3 Anemia work up No results for input(s): VITAMINB12, FOLATE, FERRITIN, TIBC, IRON, RETICCTPCT in the last 72 hours. Urinalysis    Component Value Date/Time   COLORURINE YELLOW 08/30/2017 1941   APPEARANCEUR CLEAR 08/30/2017 1941   LABSPEC 1.014 08/30/2017 1941   PHURINE 7.0 08/30/2017 1941   GLUCOSEU NEGATIVE 08/30/2017 1941   HGBUR NEGATIVE  08/30/2017 [place] 08/30/2017 1941   BILIRUBINUR neg 09/19/2014 1129   KETONESUR 5 (A) 08/30/2017 1941   PROTEINUR NEGATIVE 08/30/2017 1941   UROBILINOGEN negative 09/19/2014 1129   UROBILINOGEN 0.2 08/13/2010 1023   NITRITE NEGATIVE 08/30/2017 1941   LEUKOCYTESUR MODERATE (A) 08/30/2017 1941   Sepsis Labs Invalid input(s): PROCALCITONIN,  WBC,  LACTICIDVEN Microbiology No results found for this or any previous visit (from the past 240 hour(s)).   Time coordinating discharge: 2mins  SIGNED:   Kathie Dike, MD  [facility] 06/06/2018, 10:24 AM Pager   If 7PM-7AM, please contact night-coverage [email or web address] Password [email or web address]

## 2018-06-06 NOTE — Progress Notes (Signed)
IV removed, 2x2 gauze and paper tape applied to site, patient tolerated well. Reviewed AVS with patient who verbalized understanding.  Patient transported home by her son.

## 2018-06-09 ENCOUNTER — Telehealth: Payer: Self-pay | Admitting: Family Medicine

## 2018-06-09 NOTE — Telephone Encounter (Signed)
appt made

## 2018-06-09 NOTE — Telephone Encounter (Signed)
Patient needs hospital follow up appointment.  

## 2018-06-10 ENCOUNTER — Encounter: Payer: Self-pay | Admitting: *Deleted

## 2018-06-12 DIAGNOSIS — G8929 Other chronic pain: Secondary | ICD-10-CM | POA: Diagnosis not present

## 2018-06-12 DIAGNOSIS — M545 Low back pain: Secondary | ICD-10-CM | POA: Diagnosis not present

## 2018-06-16 NOTE — Progress Notes (Signed)
HPI The patient presents for followup of atrial fibrillation she was admitted in Nov with a small MCA infarct and was in atrial fib.  Since I last saw her her she was back in the hospital and she had atrial fib with RVR which converted spontaneously to NSR.  The patient has no new sypmtoms.  No further cardiovascular testing is indicated.  We will continue with aggressive risk reduction and meds as listed.  She has had arthritis and has had a flair since the hospital.     Allergies  Allergen Reactions  . Shellfish Allergy Itching, Swelling and Rash  . Atorvastatin Other (See Comments)    myalgias   . Crestor [Rosuvastatin] Other (See Comments)    Myalgias   . Livalo [Pitavastatin] Other (See Comments)    myalgias    Current Outpatient Medications  Medication Sig Dispense Refill  . acetaminophen (TYLENOL) 500 MG tablet Take 1,000 mg by mouth every 6 (six) hours as needed for mild pain or moderate pain.    . Calcium Carbonate-Vitamin D (CALCIUM + D PO) Take 1 tablet by mouth at bedtime.    Marland Kitchen diltiazem (CARDIZEM CD) 240 MG 24 hr capsule Take 1 capsule (240 mg total) by mouth daily. 30 capsule 1  . ELIQUIS 5 MG TABS tablet TAKE ONE TABLET BY MOUTH TWICE DAILY 60 tablet 2  . Multiple Vitamin (MULTIVITAMIN WITH MINERALS) TABS tablet Take 1 tablet by mouth at bedtime.    . nitroGLYCERIN (NITROSTAT) 0.4 MG SL tablet Place 1 tablet (0.4 mg total) under the tongue as needed. 25 tablet 11  . omega-3 acid ethyl esters (LOVAZA) 1 g capsule TAKE ONE CAPSULE BY MOUTH TWICE A DAY 120 capsule 1  . Psyllium (NAT-RUL PSYLLIUM SEED HUSKS) 500 MG CAPS Take 2 capsules by mouth daily.    . rosuvastatin (CRESTOR) 5 MG tablet Take 0.5 tablets (2.5 mg total) by mouth 2 (two) times a week. 12 tablet 1   No current facility-administered medications for this visit.     Past Medical History:  Diagnosis Date  . Arthritis    back, left shoulder, and fingers  . Atrial fibrillation ([place])   . Hyperlipidemia     . Hypertension   . Osteoporosis     Past Surgical History:  Procedure Laterality Date  . CATARACT EXTRACTION    . KNEE ARTHROSCOPY Right 07/04/2015   Procedure: RIGHT KNEE ARTHROSCOPY WITH MEDIAL MENISCAL DEBRIDEMENT AND CHONDROPLASTY;  Surgeon: Gaynelle Arabian, MD;  Location: [facility];  Service: Orthopedics;  Laterality: Right;  . OPEN REDUCTION INTERNAL FIXATION (ORIF) DISTAL RADIAL FRACTURE Right 12/28/2012   Procedure: OPEN REDUCTION INTERNAL FIXATION (ORIF) DISTAL RADIAL FRACTURE;  Surgeon: Hessie Dibble, MD;  Location: [place];  Service: Orthopedics;  Laterality: Right;  Marland Kitchen VAGINAL HYSTERECTOMY      ROS:   As stated in the HPI and negative for all other systems.   PHYSICAL EXAM BP 122/74 (BP Location: Left Arm, Patient Position: Sitting, Cuff Size: Normal)   Pulse 74   Ht 5\' 4"  (1.626 m)   Wt 185 lb 6.4 oz (84.1 kg)   BMI 31.82 kg/m   GENERAL:  Well appearing NECK:  No jugular venous distention, waveform within normal limits, carotid upstroke brisk and symmetric, no bruits, no thyromegaly LUNGS:  Clear to auscultation bilaterally CHEST:  Unremarkable HEART:  PMI not displaced or sustained,S1 and S2 within normal limits, no S3, no S4, no clicks, no rubs, no murmurs ABD:  Flat, positive  bowel sounds normal in frequency in pitch, no bruits, no rebound, no guarding, no midline pulsatile mass, no hepatomegaly, no splenomegaly EXT:  2 plus pulses throughout, no edema, no cyanosis no clubbing    Lab Results  Component Value Date   CHOL 234 (H) 03/31/2018   TRIG 157 (H) 03/31/2018   HDL 70 03/31/2018   LDLCALC 133 (H) 03/31/2018     ASSESSMENT AND PLAN  ATRIAL FIBRILLATION:     The patient has now had 2 paroxysms in the last 10 months.  She is on a higher dose of Cardizem that he was previously.  We are going to try this as our therapy but if she fails this she will probably need an antiarrhythmic.  She inquired about an ablation but I told her this was not  our standard to provide that prior to feeling antiarrhythmics.  She understands that.  She has no contraindication to anticoagulation and will continue with this.  HTN:   The blood pressure is at target. No change in medications is indicated. We will continue with therapeutic lifestyle changes (TLC).  DYSLIPIDEMIA:    LDL is above target.  She is tolerating Crestor twice a week and she will increase to 3 times a week.  She is been very sensitive to statins previously.

## 2018-06-17 ENCOUNTER — Encounter: Payer: Self-pay | Admitting: Cardiology

## 2018-06-17 ENCOUNTER — Ambulatory Visit: Payer: Medicare HMO | Admitting: Cardiology

## 2018-06-17 VITALS — BP 122/74 | HR 74 | Ht 64.0 in | Wt 185.4 lb

## 2018-06-17 DIAGNOSIS — I10 Essential (primary) hypertension: Secondary | ICD-10-CM

## 2018-06-17 DIAGNOSIS — I480 Paroxysmal atrial fibrillation: Secondary | ICD-10-CM

## 2018-06-17 DIAGNOSIS — E785 Hyperlipidemia, unspecified: Secondary | ICD-10-CM | POA: Diagnosis not present

## 2018-06-17 NOTE — Patient Instructions (Signed)
Medication Instructions:  Your physician recommends that you continue on your current medications as directed. Please refer to the Current Medication list given to you today.   Labwork: None ordered  Testing/Procedures: None ordered  Follow-Up: Your physician wants you to follow-up in: 1 year with Dr.Hochrein You will receive a reminder letter in the mail two months in advance. If you don't receive a letter, please call our office to schedule the follow-up appointment.   Any Other Special Instructions Will Be Listed Below (If Applicable).     If you need a refill on your cardiac medications before your next appointment, please call your pharmacy.   

## 2018-06-24 ENCOUNTER — Other Ambulatory Visit: Payer: Self-pay | Admitting: Family Medicine

## 2018-06-24 ENCOUNTER — Ambulatory Visit: Payer: Medicare HMO | Admitting: Family Medicine

## 2018-06-24 ENCOUNTER — Encounter: Payer: Self-pay | Admitting: Family Medicine

## 2018-06-24 VITALS — BP 119/76 | HR 75 | Temp 97.8°F | Ht 64.0 in | Wt 185.0 lb

## 2018-06-24 DIAGNOSIS — I480 Paroxysmal atrial fibrillation: Secondary | ICD-10-CM | POA: Diagnosis not present

## 2018-06-24 DIAGNOSIS — R5383 Other fatigue: Secondary | ICD-10-CM | POA: Diagnosis not present

## 2018-06-24 DIAGNOSIS — Z09 Encounter for follow-up examination after completed treatment for conditions other than malignant neoplasm: Secondary | ICD-10-CM

## 2018-06-24 DIAGNOSIS — I10 Essential (primary) hypertension: Secondary | ICD-10-CM

## 2018-06-24 NOTE — Patient Instructions (Signed)
The patient should continue to leave off the caffeine as she is currently doing She should take Tylenol if needed for aches and pains especially with her back She should follow-up with cardiology as planned If the back pain gets worse she should get back in touch with [place] We will call with lab work because of her ongoing fatigue related to getting a CBC BMP B12 level and thyroid panel as soon as those results are returned Hopefully in a fairly short period of time coming off the caffeine complaints will improve.

## 2018-06-24 NOTE — Progress Notes (Signed)
Subjective:    Patient ID: Nancy Mccormick, female    DOB: 05-28-1934, 84 y.o.   MRN: [id number]  HPI Pt here for hospital follow up from [facility] where she was seen for A fib with RVR. She was there in the ER on 06/05/18.  The patient had a recent trip to the emergency room because of palpitations.  She also had a plan to follow-up with the cardiologist.  She has a history of paroxysmal atrial fibrillation.  She was given a cardia zyme and fusion and spontaneously converted back to normal sinus rhythm.  As a result of this episode her home dose of Cardizem was increased from 180 to 240 mg.  Her chest discomforts that she was having got better after getting back in normal sinus rhythm.  The discharge it was requested that we get a repeat BMP and CBC.  She is also supposed to follow-up with the cardiologist.  Her vital signs today are good.  She also recently had a bout with back pain was seen at urgent care and no x-rays were made and she was given prednisone and a shot.  She does complain of decreased energy.  The patient denies any chest pain or any further recurrence of rapid atrial fibrillation.  The discharge summary was reviewed from the hospital.  She denies any chest pain or shortness of breath other than just fatigue which she has had trouble getting better with and this may be secondary to coming off the caffeine.  She denies any trouble with her stomach or any trouble with passing her water.  She is pleasant as always and has plans to move into an independent living situation in [place] in the next few months.     Patient Active Problem List   Diagnosis Date Noted  . Atrial fibrillation with RVR ([place]) 06/05/2018  . Elevated troponin 06/05/2018  . Statin intolerance 09/01/2017  . CVA (cerebral vascular accident) ([place]) 08/30/2017  . Acute medial meniscal tear 07/03/2015  . Osteopenia 04/19/2014  . Palpitations 11/04/2013  . Arrhythmia 11/04/2013  . FIBRILLATION, ATRIAL 09/11/2010    . Dyslipidemia (high LDL; low HDL) 09/10/2010  . Essential hypertension 09/10/2010   Outpatient Encounter Medications as of 06/24/2018  Medication Sig  . acetaminophen (TYLENOL) 500 MG tablet Take 1,000 mg by mouth every 6 (six) hours as needed for mild pain or moderate pain.  . Calcium Carbonate-Vitamin D (CALCIUM + D PO) Take 1 tablet by mouth at bedtime.  Marland Kitchen diltiazem (CARDIZEM CD) 240 MG 24 hr capsule Take 1 capsule (240 mg total) by mouth daily.  Marland Kitchen ELIQUIS 5 MG TABS tablet TAKE ONE TABLET BY MOUTH TWICE DAILY  . Multiple Vitamin (MULTIVITAMIN WITH MINERALS) TABS tablet Take 1 tablet by mouth at bedtime.  . nitroGLYCERIN (NITROSTAT) 0.4 MG SL tablet Place 1 tablet (0.4 mg total) under the tongue as needed.  Marland Kitchen omega-3 acid ethyl esters (LOVAZA) 1 g capsule TAKE ONE CAPSULE BY MOUTH TWICE A DAY  . Psyllium (NAT-RUL PSYLLIUM SEED HUSKS) 500 MG CAPS Take 2 capsules by mouth daily.  . rosuvastatin (CRESTOR) 5 MG tablet Take 0.5 tablets (2.5 mg total) by mouth 2 (two) times a week.   No facility-administered encounter medications on file as of 06/24/2018.      Review of Systems  Constitutional: Positive for fatigue (slowly getting better).  HENT: Negative.   Eyes: Negative.   Respiratory: Negative.   Cardiovascular: Negative.   Gastrointestinal: Negative.   Endocrine: Negative.   Genitourinary:  Negative.   Musculoskeletal: Positive for back pain (seen urgent care - given prednisone).  Skin: Negative.   Allergic/Immunologic: Negative.   Neurological: Negative.   Hematological: Negative.   Psychiatric/Behavioral: Negative.        Objective:   Physical Exam  Constitutional: She is oriented to person, place, and time. She appears well-developed and well-nourished. No distress.  Patient is pleasant and relaxed and smiling which is normal for her.  HENT:  Head: Normocephalic and atraumatic.  Eyes: Pupils are equal, round, and reactive to light. Conjunctivae and EOM are normal. Right  eye exhibits no discharge. Left eye exhibits no discharge. No scleral icterus.  Neck: Normal range of motion. Neck supple. No thyromegaly present.  Cardiovascular: Normal rate, regular rhythm, normal heart sounds and intact distal pulses.  No murmur heard. Heart is regular today at 72/min  Pulmonary/Chest: Effort normal and breath sounds normal. She has no wheezes. She has no rales.  Clear anteriorly and posteriorly  Abdominal: Soft. Bowel sounds are normal. She exhibits no mass. There is no tenderness.  Musculoskeletal: Normal range of motion. She exhibits no edema or tenderness.  Lymphadenopathy:    She has no cervical adenopathy.  Neurological: She is alert and oriented to person, place, and time. She has normal reflexes.  Skin: Skin is warm and dry. No rash noted.  Psychiatric: She has a normal mood and affect. Her behavior is normal. Judgment and thought content normal.  The patient's mood affect and behavior were normal for her today.  Nursing note and vitals reviewed.   BP 119/76 (BP Location: Left Arm)   Pulse 75   Temp 97.8 F (36.6 C) (Oral)   Ht 5' 4"  (1.626 m)   Wt 185 lb (83.9 kg)   BMI 31.76 kg/m       Assessment & Plan:  1. Hospital discharge follow-up - BMP8+EGFR - CBC with Differential/Platelet  2. Other fatigue -She  feels that some of this fatigue may be due to her stopping the caffeine cold [place].  She was reassured that with some time these withdrawal symptoms should improve. - Thyroid Panel With TSH - Vitamin B12  3. Essential hypertension, benign -Pressure remains good.  4. Paroxysmal atrial fibrillation (HCC) -With the increase of the cardiazem, she has had no further recurrence of atrial fibrillation. -She will continue to follow-up with the cardiologist and has recently seen him and he was pleased with her post hospital visit.  Patient Instructions  The patient should continue to leave off the caffeine as she is currently doing She should  take Tylenol if needed for aches and pains especially with her back She should follow-up with cardiology as planned If the back pain gets worse she should get back in touch with [place] We will call with lab work because of her ongoing fatigue related to getting a CBC BMP B12 level and thyroid panel as soon as those results are returned Hopefully in a fairly short period of time coming off the caffeine complaints will improve.  Arrie Senate MD

## 2018-06-25 LAB — THYROID PANEL WITH TSH
Free Thyroxine Index: 1.7 (ref 1.2–4.9)
T3 Uptake Ratio: 23 % — ABNORMAL LOW (ref 24–39)
T4, Total: 7.6 ug/dL (ref 4.5–12.0)
TSH: 2.77 u[IU]/mL (ref 0.450–4.500)

## 2018-06-25 LAB — BMP8+EGFR
BUN/Creatinine Ratio: 19 (ref 12–28)
BUN: 14 mg/dL (ref 8–27)
CHLORIDE: 102 mmol/L (ref 96–106)
CO2: 21 mmol/L (ref 20–29)
Calcium: 9.8 mg/dL (ref 8.7–10.3)
Creatinine, Ser: 0.73 mg/dL (ref 0.57–1.00)
GFR calc Af Amer: 89 mL/min/{1.73_m2} (ref >59)
GFR, EST NON AFRICAN AMERICAN: 77 mL/min/{1.73_m2} (ref >59)
GLUCOSE: 96 mg/dL (ref 65–99)
Potassium: 4.4 mmol/L (ref 3.5–5.2)
SODIUM: 146 mmol/L — AB (ref 134–144)

## 2018-06-25 LAB — CBC WITH DIFFERENTIAL/PLATELET
BASOS ABS: 0 10*3/uL (ref 0.0–0.2)
BASOS: 0 %
EOS (ABSOLUTE): 0.2 10*3/uL (ref 0.0–0.4)
Eos: 2 %
HEMATOCRIT: 41.1 % (ref 34.0–46.6)
Hemoglobin: 13.9 g/dL (ref 11.1–15.9)
IMMATURE GRANS (ABS): 0.1 10*3/uL (ref 0.0–0.1)
IMMATURE GRANULOCYTES: 1 %
LYMPHS: 22 %
Lymphocytes Absolute: 2.9 10*3/uL (ref 0.7–3.1)
MCH: 30.5 pg (ref 26.6–33.0)
MCHC: 33.8 g/dL (ref 31.5–35.7)
MCV: 90 fL (ref 79–97)
MONOS ABS: 1.1 10*3/uL — AB (ref 0.1–0.9)
Monocytes: 8 %
NEUTROS ABS: 9.2 10*3/uL — AB (ref 1.4–7.0)
NEUTROS PCT: 67 %
PLATELETS: 361 10*3/uL (ref 150–450)
RBC: 4.55 x10E6/uL (ref 3.77–5.28)
RDW: 14.8 % (ref 12.3–15.4)
WBC: 13.5 10*3/uL — ABNORMAL HIGH (ref 3.4–10.8)

## 2018-06-25 LAB — VITAMIN B12: Vitamin B-12: 620 pg/mL (ref 232–1245)

## 2018-06-29 ENCOUNTER — Telehealth: Payer: Self-pay | Admitting: Family Medicine

## 2018-06-29 NOTE — Telephone Encounter (Signed)
LM to call back about labs

## 2018-06-30 ENCOUNTER — Other Ambulatory Visit: Payer: Self-pay

## 2018-06-30 ENCOUNTER — Ambulatory Visit: Payer: Medicare HMO | Attending: Orthopedic Surgery | Admitting: Physical Therapy

## 2018-06-30 ENCOUNTER — Encounter: Payer: Self-pay | Admitting: Physical Therapy

## 2018-06-30 ENCOUNTER — Telehealth: Payer: Self-pay | Admitting: Family Medicine

## 2018-06-30 DIAGNOSIS — M25561 Pain in right knee: Secondary | ICD-10-CM | POA: Insufficient documentation

## 2018-06-30 DIAGNOSIS — M6281 Muscle weakness (generalized): Secondary | ICD-10-CM | POA: Insufficient documentation

## 2018-06-30 NOTE — Therapy (Signed)
[facility] [place], [place], [address] Phone: [phone]   Fax:  [phone]  Physical Therapy Evaluation  Patient Details  Name: Nancy Mccormick MRN: [id number] Date of Birth: 1934-07-03 Referring Provider: Gaynelle Arabian, MD   Encounter Date: 06/30/2018  PT End of Session - 06/30/18 1204    Visit Number  1    Number of Visits  8    Date for PT Re-Evaluation  08/04/18    Authorization Type  Progress note every 10th visit; KX modifier at 15th visit    PT Start Time  1115    PT Stop Time  1155    PT Time Calculation (min)  40 min    Activity Tolerance  Patient tolerated treatment well    Behavior During Therapy  [facility] for tasks assessed/performed       Past Medical History:  Diagnosis Date  . Arthritis    back, left shoulder, and fingers  . Atrial fibrillation ([place])   . Hyperlipidemia   . Hypertension   . Osteoporosis     Past Surgical History:  Procedure Laterality Date  . CATARACT EXTRACTION    . KNEE ARTHROSCOPY Right 07/04/2015   Procedure: RIGHT KNEE ARTHROSCOPY WITH MEDIAL MENISCAL DEBRIDEMENT AND CHONDROPLASTY;  Surgeon: Gaynelle Arabian, MD;  Location: [facility];  Service: Orthopedics;  Laterality: Right;  . OPEN REDUCTION INTERNAL FIXATION (ORIF) DISTAL RADIAL FRACTURE Right 12/28/2012   Procedure: OPEN REDUCTION INTERNAL FIXATION (ORIF) DISTAL RADIAL FRACTURE;  Surgeon: Hessie Dibble, MD;  Location: [place];  Service: Orthopedics;  Laterality: Right;  Marland Kitchen VAGINAL HYSTERECTOMY      There were no vitals filed for this visit.   Subjective Assessment - 06/30/18 1318    Subjective  Patient arrives to physical therapy with reports of right knee pain that began in June 2019. Patient reports doctor believes there may be torn cartilage in the right knee but is unable to get an MRI until she has received physical therapy. Patient reports she can perform all ADLs with some pain and states she mainly  get pain at the end of the day and when she comes to standing after sitting for a long period. Patient reports pain at worst is 6/10 in the right knee and pain at best is 3/10 with ice and Tylenol PRN. Patient's goals for physical therapy are to decrease pain, improve strength, and have less difficulties with home activities.    Pertinent History  HTN, A-Fib    Limitations  House hold activities;Sitting    Currently in Pain?  Yes    Pain Score  5     Pain Location  Knee    Pain Orientation  Right    Pain Descriptors / Indicators  Sore;Aching    Pain Type  Chronic pain    Pain Onset  More than a month ago    Pain Frequency  Intermittent    Aggravating Factors   inactivity, lifting    Pain Relieving Factors  ice    Effect of Pain on Daily Activities  none         [facility] PT Assessment - 06/30/18 0001      Assessment   Medical Diagnosis  right knee pain    Referring Provider  Gaynelle Arabian, MD    Onset Date/Surgical Date  --   June 2019   Next MD Visit  to be determined    Prior Therapy  no      Precautions  Precautions  None      Restrictions   Weight Bearing Restrictions  No      Balance Screen   Has the patient fallen in the past 6 months  No    Has the patient had a decrease in activity level because of a fear of falling?   No    Is the patient reluctant to leave their home because of a fear of falling?   No      Home Social worker  Private residence    Living Arrangements  Alone      Prior Function   Level of Independence  Independent      ROM / Strength   AROM / PROM / Strength  AROM;Strength      AROM   AROM Assessment Site  Knee    Right/Left Knee  Right    Right Knee Extension  0    Right Knee Flexion  129      Strength   Strength Assessment Site  Knee;Hip    Right/Left Hip  Right    Right/Left Knee  Right      Palpation   Patella mobility  WNL    Palpation comment  tenderness to palpation to right medial joint line      Special  Tests    Special Tests  --    Other special tests  (-) for ligamentous special tests; (-) anterior drawer, (-) posterior drawer, (-) valgus and varus test      Transfers   Transfers  Independent with all Transfers      Ambulation/Gait   Gait Pattern  Within Functional Limits                Objective measurements completed on examination: See above findings.              PT Education - 06/30/18 1204    Education Details  SLR, SLR with ER, bridges    Person(s) Educated  Patient    Methods  Explanation;Demonstration;Handout    Comprehension  Verbalized understanding;Returned demonstration          PT Long Term Goals - 06/30/18 1205      PT LONG TERM GOAL #1   Title  Patient will be independent with HEP.    Time  4    Period  Weeks    Status  New      PT LONG TERM GOAL #2   Title  Patient will demonstrate 4+/5 or greater right knee strength to improve stabilization during functional tasks.    Time  4    Period  Weeks    Status  New      PT LONG TERM GOAL #3   Title  Patient will demonstrate 4/5 or greater right hip extension and abduction strength to improve stability during functional tasks.     Time  4    Period  Weeks    Status  New      PT LONG TERM GOAL #4   Title  Patient will report ability to perform all ADLs with less than 3/10 right knee pain.    Time  4    Period  Weeks    Status  New             Plan - 06/30/18 1212    Clinical Impression Statement  Patient is an 84 year old female who presents to physical therapy with right knee and right hip weakness. Patient tender to palpation  to right medial joint line. Patient negative for right knee ligamentous integrity testing. Patient's 5x sit to stand score of 11 seconds categorizes her as a low fall risk for her age group. Patient's gait is within normal limits. Patient would benefit from skilled physical therapy to address deficits and address patient's goals.     History and  Personal Factors relevant to plan of care:  HTN, A-fib    Clinical Presentation  Stable    Clinical Decision Making  Low    Rehab Potential  Excellent    PT Frequency  2x / week    PT Duration  4 weeks    PT Treatment/Interventions  ADLs/Self Care Home Management;Iontophoresis 4mg /ml Dexamethasone;Gait training;Stair training;Neuromuscular re-education;Passive range of motion;Manual techniques;Taping;Vasopneumatic Device;Patient/family education;Therapeutic exercise;Therapeutic activities;Functional mobility training;Moist Heat;Electrical Stimulation;Cryotherapy    PT Next Visit Plan  FOTO; nustep, pain free right knee strengthening, hip strengthening, modalities PRN for pain relief.    Consulted and Agree with Plan of Care  Patient       Patient will benefit from skilled therapeutic intervention in order to improve the following deficits and impairments:  Pain, Decreased strength  Visit Diagnosis: Acute pain of right knee  Muscle weakness (generalized)     Problem List Patient Active Problem List   Diagnosis Date Noted  . Atrial fibrillation with RVR ([place]) 06/05/2018  . Elevated troponin 06/05/2018  . Statin intolerance 09/01/2017  . CVA (cerebral vascular accident) ([place]) 08/30/2017  . Acute medial meniscal tear 07/03/2015  . Osteopenia 04/19/2014  . Palpitations 11/04/2013  . Arrhythmia 11/04/2013  . FIBRILLATION, ATRIAL 09/11/2010  . Dyslipidemia (high LDL; low HDL) 09/10/2010  . Essential hypertension 09/10/2010   Gabriela Eves, PT, DPT 06/30/2018, 1:22 PM  [facility] [address] [place], [place], [address] Phone: [phone]   Fax:  [phone]  Name: Kimbria Camposano MRN: [id number] Date of Birth: 1934-06-30

## 2018-06-30 NOTE — Telephone Encounter (Signed)
Aware of results. 

## 2018-07-07 ENCOUNTER — Encounter: Payer: Self-pay | Admitting: Physical Therapy

## 2018-07-07 ENCOUNTER — Ambulatory Visit: Payer: Medicare HMO | Attending: Orthopedic Surgery | Admitting: Physical Therapy

## 2018-07-07 DIAGNOSIS — M6281 Muscle weakness (generalized): Secondary | ICD-10-CM | POA: Insufficient documentation

## 2018-07-07 DIAGNOSIS — M25561 Pain in right knee: Secondary | ICD-10-CM | POA: Diagnosis not present

## 2018-07-07 NOTE — Therapy (Signed)
[facility] [place], [place], [address] Phone: [phone]   Fax:  [phone]  Physical Therapy Treatment  Patient Details  Name: Nancy Mccormick MRN: [id number] Date of Birth: 08-Apr-1934 Referring Provider (PT): Gaynelle Arabian, MD   Encounter Date: 07/07/2018  PT End of Session - 07/07/18 1117    Visit Number  2    Number of Visits  8    Date for PT Re-Evaluation  08/04/18    Authorization Type  Progress note every 10th visit; KX modifier at 15th visit    PT Start Time  1115    PT Stop Time  1206    PT Time Calculation (min)  51 min    Activity Tolerance  Patient tolerated treatment well    Behavior During Therapy  [facility] for tasks assessed/performed       Past Medical History:  Diagnosis Date  . Arthritis    back, left shoulder, and fingers  . Atrial fibrillation ([place])   . Hyperlipidemia   . Hypertension   . Osteoporosis     Past Surgical History:  Procedure Laterality Date  . CATARACT EXTRACTION    . KNEE ARTHROSCOPY Right 07/04/2015   Procedure: RIGHT KNEE ARTHROSCOPY WITH MEDIAL MENISCAL DEBRIDEMENT AND CHONDROPLASTY;  Surgeon: Gaynelle Arabian, MD;  Location: [facility];  Service: Orthopedics;  Laterality: Right;  . OPEN REDUCTION INTERNAL FIXATION (ORIF) DISTAL RADIAL FRACTURE Right 12/28/2012   Procedure: OPEN REDUCTION INTERNAL FIXATION (ORIF) DISTAL RADIAL FRACTURE;  Surgeon: Hessie Dibble, MD;  Location: [place];  Service: Orthopedics;  Laterality: Right;  Marland Kitchen VAGINAL HYSTERECTOMY      There were no vitals filed for this visit.  Subjective Assessment - 07/07/18 1117    Subjective  Patient reports feeling a little sore and more swollen after doing more activities but reports no pain at rest.    Pertinent History  HTN, A-Fib    Limitations  House hold activities;Sitting    Currently in Pain?  No/denies    Pain Score  0-No pain         [facility] PT Assessment - 07/07/18 0001      Assessment   Medical Diagnosis  right knee pain    Referring Provider (PT)  Gaynelle Arabian, MD      Observation/Other Assessments   Focus on Therapeutic Outcomes (FOTO)   38% limited      Strength   Right/Left Hip  Right    Right Hip Flexion  3+/5    Right Hip Extension  3+/5    Right Hip ABduction  3+/5    Right/Left Knee  Right    Right Knee Flexion  4-/5    Right Knee Extension  4/5                   [facility] Adult PT Treatment/Exercise - 07/07/18 0001      Exercises   Exercises  Knee/Hip      Knee/Hip Exercises: Aerobic   Stationary Bike  Level 3 x14 minutes      Knee/Hip Exercises: Seated   Long Arc Quad  AROM;Strengthening;Right;2 sets;10 reps    Hamstring Curl  Strengthening;Right;2 sets;10 reps    Hamstring Limitations  yellow theraband      Knee/Hip Exercises: Supine   Short Arc Quad Sets  Strengthening;Right   3" hold x2 minutes   Straight Leg Raises  Strengthening;Right;2 sets;10 reps    Straight Leg Raise with External Rotation  Strengthening;Right;2 sets;10 reps  Modalities   Modalities  Electrical Stimulation;Moist Heat      Moist Heat Therapy   Number Minutes Moist Heat  15 Minutes    Moist Heat Location  Knee      Electrical Stimulation   Electrical Stimulation Location  right knee medial/lateral joint lines    Electrical Stimulation Action  pre-mod    Electrical Stimulation Parameters  80-150 hz x15 min    Electrical Stimulation Goals  Pain                  PT Long Term Goals - 06/30/18 1205      PT LONG TERM GOAL #1   Title  Patient will be independent with HEP.    Time  4    Period  Weeks    Status  New      PT LONG TERM GOAL #2   Title  Patient will demonstrate 4+/5 or greater right knee strength to improve stabilization during functional tasks.    Time  4    Period  Weeks    Status  New      PT LONG TERM GOAL #3   Title  Patient will demonstrate 4/5 or greater right hip extension and abduction strength to  improve stability during functional tasks.     Time  4    Period  Weeks    Status  New      PT LONG TERM GOAL #4   Title  Patient will report ability to perform all ADLs with less than 3/10 right knee pain.    Time  4    Period  Weeks    Status  New            Plan - 07/07/18 1211    Clinical Impression Statement  Patient was able to tolerate treatment well with minimal reports of muscle soreness. Patient noted with increased joint line pain with LAQ but reported no pain with SAQ. Patient also instructed on proper technique of SLR and to raise to 3/4 of way instead of full to reduce muscle "pulling". Patient reported decrase in pulling after cue. Normal response to modalities upon removal.    Clinical Presentation  Stable    Clinical Decision Making  Low    Rehab Potential  Excellent    PT Frequency  2x / week    PT Duration  4 weeks    PT Treatment/Interventions  ADLs/Self Care Home Management;Iontophoresis 4mg /ml Dexamethasone;Gait training;Stair training;Neuromuscular re-education;Passive range of motion;Manual techniques;Taping;Vasopneumatic Device;Patient/family education;Therapeutic exercise;Therapeutic activities;Functional mobility training;Moist Heat;Electrical Stimulation;Cryotherapy    PT Next Visit Plan  nustep, pain free right knee strengthening, hip strengthening, modalities PRN for pain relief.    Consulted and Agree with Plan of Care  Patient       Patient will benefit from skilled therapeutic intervention in order to improve the following deficits and impairments:  Pain, Decreased strength  Visit Diagnosis: Acute pain of right knee  Muscle weakness (generalized)     Problem List Patient Active Problem List   Diagnosis Date Noted  . Atrial fibrillation with RVR ([place]) 06/05/2018  . Elevated troponin 06/05/2018  . Statin intolerance 09/01/2017  . CVA (cerebral vascular accident) ([place]) 08/30/2017  . Acute medial meniscal tear 07/03/2015  . Osteopenia  04/19/2014  . Palpitations 11/04/2013  . Arrhythmia 11/04/2013  . FIBRILLATION, ATRIAL 09/11/2010  . Dyslipidemia (high LDL; low HDL) 09/10/2010  . Essential hypertension 09/10/2010   Gabriela Eves, PT, DPT 07/07/2018, 2:56 PM  [facility] [place], [place], [address] Phone: [phone]   Fax:  [phone]  Name: Erinne Gillentine MRN: [id number] Date of Birth: [birth year]

## 2018-07-08 ENCOUNTER — Ambulatory Visit: Payer: Medicare HMO | Admitting: Physical Therapy

## 2018-07-08 ENCOUNTER — Encounter: Payer: Self-pay | Admitting: Physical Therapy

## 2018-07-08 DIAGNOSIS — M25561 Pain in right knee: Secondary | ICD-10-CM

## 2018-07-08 DIAGNOSIS — M6281 Muscle weakness (generalized): Secondary | ICD-10-CM | POA: Diagnosis not present

## 2018-07-08 NOTE — Therapy (Signed)
[facility] [place], [place], [address] Phone: [phone]   Fax:  [phone]  Physical Therapy Treatment  Patient Details  Name: Nancy Mccormick MRN: [id number] Date of Birth: 05-23-1934 Referring Provider (PT): Gaynelle Arabian, MD   Encounter Date: 07/08/2018  PT End of Session - 07/08/18 0934    Visit Number  3    Number of Visits  8    Date for PT Re-Evaluation  08/04/18    Authorization Type  Progress note every 10th visit; KX modifier at 15th visit    PT Start Time  0900    PT Stop Time  0944    PT Time Calculation (min)  44 min    Activity Tolerance  Patient tolerated treatment well    Behavior During Therapy  [facility] for tasks assessed/performed       Past Medical History:  Diagnosis Date  . Arthritis    back, left shoulder, and fingers  . Atrial fibrillation ([place])   . Hyperlipidemia   . Hypertension   . Osteoporosis     Past Surgical History:  Procedure Laterality Date  . CATARACT EXTRACTION    . KNEE ARTHROSCOPY Right 07/04/2015   Procedure: RIGHT KNEE ARTHROSCOPY WITH MEDIAL MENISCAL DEBRIDEMENT AND CHONDROPLASTY;  Surgeon: Gaynelle Arabian, MD;  Location: [facility];  Service: Orthopedics;  Laterality: Right;  . OPEN REDUCTION INTERNAL FIXATION (ORIF) DISTAL RADIAL FRACTURE Right 12/28/2012   Procedure: OPEN REDUCTION INTERNAL FIXATION (ORIF) DISTAL RADIAL FRACTURE;  Surgeon: Hessie Dibble, MD;  Location: [facility];  Service: Orthopedics;  Laterality: Right;  Marland Kitchen VAGINAL HYSTERECTOMY      There were no vitals filed for this visit.  Subjective Assessment - 07/08/18 0903    Subjective  Patient arrived with some ongoing discomfort, did after last treatment, some soreness after exercises    Pertinent History  HTN, A-Fib    Limitations  House hold activities;Sitting    Currently in Pain?  Yes    Pain Score  1     Pain Location  Knee    Pain Orientation  Right    Pain Descriptors / Indicators   Sore;Discomfort    Pain Type  Chronic pain    Pain Onset  More than a month ago    Pain Frequency  Intermittent    Aggravating Factors   certain activity    Pain Relieving Factors  ice and rest                       [facility] Adult PT Treatment/Exercise - 07/08/18 0001      Exercises   Exercises  Knee/Hip      Knee/Hip Exercises: Aerobic   Stationary Bike  86mn L2      Knee/Hip Exercises: Standing   Terminal Knee Extension  Strengthening;Right;20 reps;Theraband    Lateral Step Up  Right;2 sets;10 reps;Step Height: 4";Hand Hold: 1    Forward Step Up  Right;2 sets;10 reps;Step Height: 4";Hand Hold: 1      Knee/Hip Exercises: Supine   Short Arc Quad Sets  Strengthening;Right;20 reps;10 reps    Short Arc Quad Sets Limitations  with ball squeeze for VMO activation    Bridges with [employer] Strengthening;20 reps    Straight Leg Raises  Strengthening;Right;2 sets;10 reps    Other Supine Knee/Hip Exercises  clamshell with red t-band x30      Moist Heat Therapy   Number Minutes Moist Heat  15 Minutes  Moist Heat Location  Knee      Electrical Stimulation   Electrical Stimulation Location  right knee medial/lateral joint lines    Electrical Stimulation Action  premod    Electrical Stimulation Parameters  80-150hz  x57mn    Electrical Stimulation Goals  Pain                  PT Long Term Goals - 07/08/18 0931      PT LONG TERM GOAL #1   Title  Patient will be independent with HEP.    Time  4    Period  Weeks    Status  Partially Met   doing well with initial HEP and will issue a progression the next few visits 07/08/18     PT LONG TERM GOAL #2   Title  Patient will demonstrate 4+/5 or greater right knee strength to improve stabilization during functional tasks.    Time  4    Period  Weeks    Status  On-going   NT 07/08/18     PT LONG TERM GOAL #3   Title  Patient will demonstrate 4/5 or greater right hip extension and abduction strength to  improve stability during functional tasks.     Time  4    Period  Weeks    Status  On-going   NT 07/08/18     PT LONG TERM GOAL #4   Title  Patient will report ability to perform all ADLs with less than 3/10 right knee pain.    Time  4    Period  Weeks    Status  On-going   doing well thus far progressing with ADL's yet not doing all at this time 07/08/18           Plan - 07/08/18 0935    Clinical Impression Statement  Patient tolerated treatment well today. Patient able to progress with right knee pain free strengthening today. Patient reported doing HEP as instructed with no difficulty. Patient will be able to progress HEP the next few visits. Patient has reported decreased discomfort thus far yet not performing all ADL's at this time. Goals progressing yet ongoing.     Rehab Potential  Excellent    PT Frequency  2x / week    PT Duration  4 weeks    PT Treatment/Interventions  ADLs/Self Care Home Management;Iontophoresis 462mml Dexamethasone;Gait training;Stair training;Neuromuscular re-education;Passive range of motion;Manual techniques;Taping;Vasopneumatic Device;Patient/family education;Therapeutic exercise;Therapeutic activities;Functional mobility training;Moist Heat;Electrical Stimulation;Cryotherapy    PT Next Visit Plan  assess pprogression with exercises then cont with POC for nustep, pain free right knee strengthening, hip strengthening, modalities PRN for pain relief. Issue HEP progression when appropriate    Consulted and Agree with Plan of Care  Patient       Patient will benefit from skilled therapeutic intervention in order to improve the following deficits and impairments:  Pain, Decreased strength  Visit Diagnosis: Acute pain of right knee  Muscle weakness (generalized)     Problem List Patient Active Problem List   Diagnosis Date Noted  . Atrial fibrillation with RVR ([facility]/31/2019  . Elevated troponin 06/05/2018  . Statin intolerance 09/01/2017  .  CVA (cerebral vascular accident) ([facility]/25/2018  . Acute medial meniscal tear 07/03/2015  . Osteopenia 04/19/2014  . Palpitations 11/04/2013  . Arrhythmia 11/04/2013  . FIBRILLATION, ATRIAL 09/11/2010  . Dyslipidemia (high LDL; low HDL) 09/10/2010  . Essential hypertension 09/10/2010    Kalel Harty P, PTA 07/08/2018, 9:47 AM  [facility] [place], [place], [address] Phone: [phone]   Fax:  [phone]  Name: Yvana Samonte MRN: [id number] Date of Birth: 07-13-1934

## 2018-07-14 ENCOUNTER — Ambulatory Visit: Payer: Medicare HMO | Admitting: Physician Assistant

## 2018-07-14 ENCOUNTER — Ambulatory Visit: Payer: Medicare HMO | Admitting: Physical Therapy

## 2018-07-14 ENCOUNTER — Ambulatory Visit (INDEPENDENT_AMBULATORY_CARE_PROVIDER_SITE_OTHER): Payer: Medicare HMO

## 2018-07-14 ENCOUNTER — Encounter: Payer: Self-pay | Admitting: Physician Assistant

## 2018-07-14 VITALS — BP 139/81 | HR 70 | Temp 96.8°F | Ht 64.0 in | Wt 180.0 lb

## 2018-07-14 DIAGNOSIS — M25562 Pain in left knee: Secondary | ICD-10-CM | POA: Diagnosis not present

## 2018-07-14 DIAGNOSIS — M1712 Unilateral primary osteoarthritis, left knee: Secondary | ICD-10-CM | POA: Diagnosis not present

## 2018-07-14 MED ORDER — DILTIAZEM HCL ER COATED BEADS 240 MG PO CP24: 240.0000 mg | ORAL_CAPSULE | Freq: Every day | ORAL | 3 refills | Status: DC

## 2018-07-14 MED ORDER — PREDNISONE 10 MG PO TABS: 10.0000 mg | ORAL_TABLET | Freq: Every day | ORAL | 0 refills | Status: DC

## 2018-07-14 NOTE — Progress Notes (Signed)
BP 139/81   Pulse 70   Temp (!) 96.8 F (36 C) (Oral)   Ht 5\' 4"  (1.626 m)   Wt 180 lb (81.6 kg)   BMI 30.90 kg/m    Subjective:    Patient ID: Nancy Mccormick, female    DOB: 11/15/1933, 84 y.o.   MRN: [id number]  HPI: Nancy Mccormick is a 84 y.o. female presenting on 07/14/2018 for Knee Pain (left )  Over past few days she has had increased left knee pain on the lateral portion.  She denies any specific injury.  She has been going to physical therapy for her right knee.  She does have a torn cartilage in it.  She is under the care of Dr. Wynelle Link.  She is seeing physical therapy here in [place].  She is going through 2 sessions.  I have encouraged her to try to go tomorrow.  But with modified activity.  Her x-ray today does not show any acute deformities.  We will make another orthopedic referral in the near future for her left knee now.  Past Medical History:  Diagnosis Date  . Arthritis    back, left shoulder, and fingers  . Atrial fibrillation ([place])   . Hyperlipidemia   . Hypertension   . Osteoporosis    Relevant past medical, surgical, family and social history reviewed and updated as indicated. Interim medical history since our last visit reviewed. Allergies and medications reviewed and updated. DATA REVIEWED: CHART IN EPIC  Family History reviewed for pertinent findings.  Review of Systems  Constitutional: Negative.   HENT: Negative.   Eyes: Negative.   Respiratory: Negative.   Gastrointestinal: Negative.   Genitourinary: Negative.   Musculoskeletal: Positive for arthralgias, gait problem and joint swelling.    Allergies as of 07/14/2018      Reactions   Shellfish Allergy Itching, Swelling, Rash   Atorvastatin Other (See Comments)   myalgias   Crestor [rosuvastatin] Other (See Comments)   Myalgias   Livalo [pitavastatin] Other (See Comments)   myalgias      Medication List        Accurate as of 07/14/18  1:59 PM. Always use your most recent med  list.          acetaminophen 500 MG tablet Commonly known as:  TYLENOL Take 1,000 mg by mouth every 6 (six) hours as needed for mild pain or moderate pain.   CALCIUM + D PO Take 1 tablet by mouth at bedtime.   diltiazem 240 MG 24 hr capsule Commonly known as:  CARDIZEM CD Take 1 capsule (240 mg total) by mouth daily.   ELIQUIS 5 MG Tabs tablet Generic drug:  apixaban TAKE ONE TABLET BY MOUTH TWICE DAILY   multivitamin with minerals Tabs tablet Take 1 tablet by mouth at bedtime.   NAT-RUL PSYLLIUM SEED HUSKS 500 MG Caps Generic drug:  Psyllium Take 2 capsules by mouth daily.   nitroGLYCERIN 0.4 MG SL tablet Commonly known as:  NITROSTAT Place 1 tablet (0.4 mg total) under the tongue as needed.   omega-3 acid ethyl esters 1 g capsule Commonly known as:  LOVAZA TAKE ONE CAPSULE BY MOUTH TWICE A DAY   predniSONE 10 MG tablet Commonly known as:  DELTASONE Take 1 tablet (10 mg total) by mouth daily with breakfast.   rosuvastatin 5 MG tablet Commonly known as:  CRESTOR TAKE 1/2 TABLET TWICE A WEEK          Objective:    BP 139/81  Pulse 70   Temp (!) 96.8 F (36 C) (Oral)   Ht 5\' 4"  (1.626 m)   Wt 180 lb (81.6 kg)   BMI 30.90 kg/m   Allergies  Allergen Reactions  . Shellfish Allergy Itching, Swelling and Rash  . Atorvastatin Other (See Comments)    myalgias   . Crestor [Rosuvastatin] Other (See Comments)    Myalgias   . Livalo [Pitavastatin] Other (See Comments)    myalgias    Wt Readings from Last 3 Encounters:  07/14/18 180 lb (81.6 kg)  06/24/18 185 lb (83.9 kg)  06/17/18 185 lb 6.4 oz (84.1 kg)    Physical Exam  Constitutional: She is oriented to person, place, and time. She appears well-developed and well-nourished.  HENT:  Head: Normocephalic and atraumatic.  Eyes: Pupils are equal, round, and reactive to light. Conjunctivae and EOM are normal.  Cardiovascular: Normal rate, regular rhythm, normal heart sounds and intact distal pulses.    Pulmonary/Chest: Effort normal and breath sounds normal.  Abdominal: Soft. Bowel sounds are normal.  Musculoskeletal:       Left knee: She exhibits decreased range of motion, swelling and bony tenderness. She exhibits no erythema, normal alignment, no LCL laxity, normal patellar mobility and no MCL laxity. Tenderness found. Lateral joint line tenderness noted.       Legs: Neurological: She is alert and oriented to person, place, and time. She has normal reflexes.  Skin: Skin is warm and dry. No rash noted.  Psychiatric: She has a normal mood and affect. Her behavior is normal. Judgment and thought content normal.        Assessment & Plan:   1. Left knee pain, unspecified chronicity - DG Knee 1-2 Views Left; Future - predniSONE (DELTASONE) 10 MG tablet; Take 1 tablet (10 mg total) by mouth daily with breakfast.  Dispense: 30 tablet; Refill: 0   Continue all other maintenance medications as listed above.  Follow up plan: No follow-ups on file.  Educational handout given for [place] PA-C [facility] [address]  [place], [place] [address] [phone]   07/14/2018, 1:59 PM

## 2018-07-15 ENCOUNTER — Encounter: Payer: Self-pay | Admitting: Physical Therapy

## 2018-07-15 ENCOUNTER — Ambulatory Visit: Payer: Medicare HMO | Admitting: Physical Therapy

## 2018-07-15 DIAGNOSIS — M6281 Muscle weakness (generalized): Secondary | ICD-10-CM | POA: Diagnosis not present

## 2018-07-15 DIAGNOSIS — M25561 Pain in right knee: Secondary | ICD-10-CM

## 2018-07-15 NOTE — Therapy (Signed)
[facility] [place], [place], [address] Phone: [phone]   Fax:  [phone]  Physical Therapy Treatment  Patient Details  Name: Nancy Mccormick MRN: [id number] Date of Birth: Nov 07, 1933 Referring Provider (PT): Gaynelle Arabian, MD   Encounter Date: 07/15/2018  PT End of Session - 07/15/18 0923    Visit Number  4    Number of Visits  8    Date for PT Re-Evaluation  08/04/18    Authorization Type  Progress note every 10th visit; KX modifier at 15th visit    PT Start Time  0902    PT Stop Time  0942    PT Time Calculation (min)  40 min    Activity Tolerance  Patient tolerated treatment well    Behavior During Therapy  [facility] for tasks assessed/performed       Past Medical History:  Diagnosis Date  . Arthritis    back, left shoulder, and fingers  . Atrial fibrillation ([place])   . Hyperlipidemia   . Hypertension   . Osteoporosis     Past Surgical History:  Procedure Laterality Date  . CATARACT EXTRACTION    . KNEE ARTHROSCOPY Right 07/04/2015   Procedure: RIGHT KNEE ARTHROSCOPY WITH MEDIAL MENISCAL DEBRIDEMENT AND CHONDROPLASTY;  Surgeon: Gaynelle Arabian, MD;  Location: [facility];  Service: Orthopedics;  Laterality: Right;  . OPEN REDUCTION INTERNAL FIXATION (ORIF) DISTAL RADIAL FRACTURE Right 12/28/2012   Procedure: OPEN REDUCTION INTERNAL FIXATION (ORIF) DISTAL RADIAL FRACTURE;  Surgeon: Hessie Dibble, MD;  Location: [facility];  Service: Orthopedics;  Laterality: Right;  Marland Kitchen VAGINAL HYSTERECTOMY      There were no vitals filed for this visit.  Subjective Assessment - 07/15/18 0906    Subjective  Patient arrived with minimal pain on right knee yet reported she went to MD regarding left knee and has torn tissue and is no go to ortho MD on 07/27/18, is to continue therapy with right knee only    Pertinent History  HTN, A-Fib    Limitations  House hold activities;Sitting    Currently in Pain?  Yes    Pain  Score  1     Pain Location  Knee    Pain Orientation  Right    Pain Descriptors / Indicators  Discomfort    Pain Type  Chronic pain    Pain Onset  More than a month ago    Pain Frequency  Intermittent    Aggravating Factors   increased activity    Pain Relieving Factors  rst and ice                       [facility] Adult PT Treatment/Exercise - 07/15/18 0001      Exercises   Exercises  Knee/Hip      Knee/Hip Exercises: Seated   Long Arc Quad  Strengthening;Right;3 sets;10 reps;Weights    Long Arc Quad Weight  2 lbs.    Hamstring Curl  Strengthening;Right;20 reps;10 reps    Hamstring Limitations  red t-band      Knee/Hip Exercises: Supine   Short Arc Quad Sets  Strengthening;Right;20 reps;10 reps;Limitations    Short Arc Quad Sets Limitations  with 2# and with ball squeeze for VMO activation    Bridges Limitations  unable to perform due to left knee    Constance Haw with [employer]  --    Straight Leg Raises  Strengthening;Right;2 sets;10 reps    Straight Leg Raise with External  Rotation  Strengthening;Right;2 sets;10 reps    Other Supine Knee/Hip Exercises  clamshell with Rt knee only red t-band x30      Moist Heat Therapy   Number Minutes Moist Heat  15 Minutes    Moist Heat Location  Knee      Electrical Stimulation   Electrical Stimulation Location  right knee medial/lateral joint lines    Electrical Stimulation Action  premod    Electrical Stimulation Parameters  80-_0  x58mn    Electrical Stimulation Goals  Pain                  PT Long Term Goals - 07/08/18 0931      PT LONG TERM GOAL #1   Title  Patient will be independent with HEP.    Time  4    Period  Weeks    Status  Partially Met   doing well with initial HEP and will issue a progression the next few visits 07/08/18     PT LONG TERM GOAL #2   Title  Patient will demonstrate 4+/5 or greater right knee strength to improve stabilization during functional tasks.    Time  4    Period   Weeks    Status  On-going   NT 07/08/18     PT LONG TERM GOAL #3   Title  Patient will demonstrate 4/5 or greater right hip extension and abduction strength to improve stability during functional tasks.     Time  4    Period  Weeks    Status  On-going   NT 07/08/18     PT LONG TERM GOAL #4   Title  Patient will report ability to perform all ADLs with less than 3/10 right knee pain.    Time  4    Period  Weeks    Status  On-going   doing well thus far progressing with ADL's yet not doing all at this time 07/08/18           Plan - 07/15/18 06226   Clinical Impression Statement  Patient tolerated treatment well today yet only focused on right knee today to avoid any discomfort to the left. Patient has a tear in tissue per her MD and will be going to Ortho MD on 10/22. Patient has minimal discomfort in right knee and able to complete all exercises. Patient goals ongoing at this time.     Rehab Potential  Excellent    PT Frequency  2x / week    PT Duration  4 weeks    PT Treatment/Interventions  ADLs/Self Care Home Management;Iontophoresis 457mml Dexamethasone;Gait training;Stair training;Neuromuscular re-education;Passive range of motion;Manual techniques;Taping;Vasopneumatic Device;Patient/family education;Therapeutic exercise;Therapeutic activities;Functional mobility training;Moist Heat;Electrical Stimulation;Cryotherapy    PT Next Visit Plan   cont with POC for pain free right knee strengthening, hip strengthening, modalities PRN for pain relief. Issue HEP progression when appropriate NO EXERCISE WITH LEFT LE atthis time per MD    Consulted and Agree with Plan of Care  Patient       Patient will benefit from skilled therapeutic intervention in order to improve the following deficits and impairments:  Pain, Decreased strength  Visit Diagnosis: Acute pain of right knee  Muscle weakness (generalized)     Problem List Patient Active Problem List   Diagnosis Date Noted  .  Atrial fibrillation with RVR ([facility]08/31/2019  . Elevated troponin 06/05/2018  . Statin intolerance 09/01/2017  . CVA (cerebral vascular accident) ([facility]/25/2018  . Acute  medial meniscal tear 07/03/2015  . Osteopenia 04/19/2014  . Palpitations 11/04/2013  . Arrhythmia 11/04/2013  . FIBRILLATION, ATRIAL 09/11/2010  . Dyslipidemia (high LDL; low HDL) 09/10/2010  . Essential hypertension 09/10/2010    Phillips Climes, PTA 07/15/2018, 9:43 AM  [facility] [place], [place], [address] Phone: [phone]   Fax:  [phone]  Name: Marshella Tello MRN: [id number] Date of Birth: 05-19-1934

## 2018-07-21 ENCOUNTER — Ambulatory Visit: Payer: Medicare HMO | Admitting: Physical Therapy

## 2018-07-21 ENCOUNTER — Encounter: Payer: Self-pay | Admitting: Physical Therapy

## 2018-07-21 DIAGNOSIS — M25561 Pain in right knee: Secondary | ICD-10-CM | POA: Diagnosis not present

## 2018-07-21 DIAGNOSIS — M6281 Muscle weakness (generalized): Secondary | ICD-10-CM | POA: Diagnosis not present

## 2018-07-21 NOTE — Therapy (Signed)
[facility] [place], [place], [address] Phone: [phone]   Fax:  [phone]  Physical Therapy Treatment  Patient Details  Name: Nancy Mccormick MRN: [id number] Date of Birth: Jan 19, 1934 Referring Provider (PT): Gaynelle Arabian, MD   Encounter Date: 07/21/2018  PT End of Session - 07/21/18 1124    Visit Number  5    Number of Visits  8    Date for PT Re-Evaluation  08/04/18    Authorization Type  Progress note every 10th visit; KX modifier at 15th visit    PT Start Time  1122   2 units secondary to discomfort ending therex   PT Stop Time  1202    PT Time Calculation (min)  40 min    Activity Tolerance  Patient tolerated treatment well;Patient limited by pain    Behavior During Therapy  [facility] for tasks assessed/performed       Past Medical History:  Diagnosis Date  . Arthritis    back, left shoulder, and fingers  . Atrial fibrillation ([place])   . Hyperlipidemia   . Hypertension   . Osteoporosis     Past Surgical History:  Procedure Laterality Date  . CATARACT EXTRACTION    . KNEE ARTHROSCOPY Right 07/04/2015   Procedure: RIGHT KNEE ARTHROSCOPY WITH MEDIAL MENISCAL DEBRIDEMENT AND CHONDROPLASTY;  Surgeon: Gaynelle Arabian, MD;  Location: [facility];  Service: Orthopedics;  Laterality: Right;  . OPEN REDUCTION INTERNAL FIXATION (ORIF) DISTAL RADIAL FRACTURE Right 12/28/2012   Procedure: OPEN REDUCTION INTERNAL FIXATION (ORIF) DISTAL RADIAL FRACTURE;  Surgeon: Hessie Dibble, MD;  Location: [place];  Service: Orthopedics;  Laterality: Right;  Marland Kitchen VAGINAL HYSTERECTOMY      There were no vitals filed for this visit.  Subjective Assessment - 07/21/18 1120    Subjective  Reports that this morning "is rough." Reports that her R knee isn't bad.    Pertinent History  HTN, A-Fib    Limitations  House hold activities;Sitting    Currently in Pain?  Yes    Pain Score  5     Pain Location  Knee    Pain  Orientation  Right    Pain Descriptors / Indicators  Discomfort    Pain Type  Chronic pain    Pain Onset  More than a month ago         [facility] PT Assessment - 07/21/18 0001      Assessment   Medical Diagnosis  right knee pain    Referring Provider (PT)  Gaynelle Arabian, MD    Next MD Visit  to be determined    Prior Therapy  no      Precautions   Precautions  None      Restrictions   Weight Bearing Restrictions  No                   [facility] Adult PT Treatment/Exercise - 07/21/18 0001      Exercises   Exercises  Knee/Hip      Knee/Hip Exercises: Seated   Long Arc Quad  Strengthening;Right;3 sets;10 reps;Weights    Long Arc Quad Weight  2 lbs.    Clamshell with TheraBand  Red   RLE only x30 reps   Knee/Hip Flexion  2# RLE 3x10 reps    Other Seated Knee/Hip Exercises  B heel raises x30 reps    Hamstring Curl  Strengthening;Right;3 sets;10 reps;Limitations    Hamstring Limitations  red t-band  Knee/Hip Exercises: Supine   Short Arc Quad Sets  AROM;Right;3 sets;10 reps    Short Arc Quad Sets Limitations  with ball squeeze for VMO activation    Terminal Knee Extension  Strengthening;Right;3 sets;10 reps;Theraband    Theraband Level (Terminal Knee Extension)  Level 2 (Red)    Straight Leg Raises  Strengthening;Right;2 sets;10 reps      Modalities   Modalities  Electrical Stimulation;Moist Heat      Moist Heat Therapy   Number Minutes Moist Heat  15 Minutes    Moist Heat Location  Knee      Electrical Stimulation   Electrical Stimulation Location  right knee medial/lateral joint lines    Electrical Stimulation Action  Pre-Mod    Electrical Stimulation Parameters  80-150 hz x15 min    Electrical Stimulation Goals  Pain                  PT Long Term Goals - 07/08/18 0931      PT LONG TERM GOAL #1   Title  Patient will be independent with HEP.    Time  4    Period  Weeks    Status  Partially Met   doing well with initial HEP and will issue  a progression the next few visits 07/08/18     PT LONG TERM GOAL #2   Title  Patient will demonstrate 4+/5 or greater right knee strength to improve stabilization during functional tasks.    Time  4    Period  Weeks    Status  On-going   NT 07/08/18     PT LONG TERM GOAL #3   Title  Patient will demonstrate 4/5 or greater right hip extension and abduction strength to improve stability during functional tasks.     Time  4    Period  Weeks    Status  On-going   NT 07/08/18     PT LONG TERM GOAL #4   Title  Patient will report ability to perform all ADLs with less than 3/10 right knee pain.    Time  4    Period  Weeks    Status  On-going   doing well thus far progressing with ADL's yet not doing all at this time 07/08/18           Plan - 07/21/18 1202    Clinical Impression Statement  Patient tolerated today's treatment fairly well although patient more limited by L knee pain compared to R knee. Patient able to complete all exercises without complaint of any increased R knee pain. Patient currently ambulating with AD. Therex session ended short secondary to patient reports of L knee pain. Normal modalities response noted following removal of the modalities.    Rehab Potential  Excellent    PT Frequency  2x / week    PT Duration  4 weeks    PT Treatment/Interventions  ADLs/Self Care Home Management;Iontophoresis 85m/ml Dexamethasone;Gait training;Stair training;Neuromuscular re-education;Passive range of motion;Manual techniques;Taping;Vasopneumatic Device;Patient/family education;Therapeutic exercise;Therapeutic activities;Functional mobility training;Moist Heat;Electrical Stimulation;Cryotherapy    PT Next Visit Plan   cont with POC for pain free right knee strengthening, hip strengthening, modalities PRN for pain relief. Issue HEP progression when appropriate NO EXERCISE WITH LEFT LE atthis time per MD    Consulted and Agree with Plan of Care  Patient       Patient will benefit  from skilled therapeutic intervention in order to improve the following deficits and impairments:  Pain, Decreased strength  Visit Diagnosis: Acute pain of right knee  Muscle weakness (generalized)     Problem List Patient Active Problem List   Diagnosis Date Noted  . Atrial fibrillation with RVR ([place]) 06/05/2018  . Elevated troponin 06/05/2018  . Statin intolerance 09/01/2017  . CVA (cerebral vascular accident) ([place]) 08/30/2017  . Acute medial meniscal tear 07/03/2015  . Osteopenia 04/19/2014  . Palpitations 11/04/2013  . Arrhythmia 11/04/2013  . FIBRILLATION, ATRIAL 09/11/2010  . Dyslipidemia (high LDL; low HDL) 09/10/2010  . Essential hypertension 09/10/2010    Standley Brooking, PTA 07/21/2018, 12:07 PM  [facility] [address] [place], [place], [address] Phone: [phone]   Fax:  [phone]  Name: Nancy Mccormick MRN: [id number] Date of Birth: 1933-12-29

## 2018-07-22 ENCOUNTER — Ambulatory Visit: Payer: Medicare HMO | Admitting: Physical Therapy

## 2018-07-22 ENCOUNTER — Encounter: Payer: Self-pay | Admitting: Physical Therapy

## 2018-07-22 DIAGNOSIS — M6281 Muscle weakness (generalized): Secondary | ICD-10-CM | POA: Diagnosis not present

## 2018-07-22 DIAGNOSIS — M25561 Pain in right knee: Secondary | ICD-10-CM | POA: Diagnosis not present

## 2018-07-22 NOTE — Therapy (Signed)
[facility] [place], [place], [address] Phone: [phone]   Fax:  [phone]  Physical Therapy Treatment  Patient Details  Name: Nancy Mccormick MRN: [id number] Date of Birth: 1934-09-30 Referring Provider (PT): Gaynelle Arabian, MD   Encounter Date: 07/22/2018  PT End of Session - 07/22/18 0931    Visit Number  6    Number of Visits  8    Date for PT Re-Evaluation  08/04/18    Authorization Type  Progress note every 10th visit; KX modifier at 15th visit    PT Start Time  0901    PT Stop Time  0941    PT Time Calculation (min)  40 min    Activity Tolerance  Patient tolerated treatment well;Patient limited by pain    Behavior During Therapy  [facility] for tasks assessed/performed       Past Medical History:  Diagnosis Date  . Arthritis    back, left shoulder, and fingers  . Atrial fibrillation ([place])   . Hyperlipidemia   . Hypertension   . Osteoporosis     Past Surgical History:  Procedure Laterality Date  . CATARACT EXTRACTION    . KNEE ARTHROSCOPY Right 07/04/2015   Procedure: RIGHT KNEE ARTHROSCOPY WITH MEDIAL MENISCAL DEBRIDEMENT AND CHONDROPLASTY;  Surgeon: Gaynelle Arabian, MD;  [facility];  Service: Orthopedics;  Laterality: Right;  . OPEN REDUCTION INTERNAL FIXATION (ORIF) DISTAL RADIAL FRACTURE Right 12/28/2012   Procedure: OPEN REDUCTION INTERNAL FIXATION (ORIF) DISTAL RADIAL FRACTURE;  Surgeon: Hessie Dibble, MD;  Location: [place];  Service: Orthopedics;  Laterality: Right;  Marland Kitchen VAGINAL HYSTERECTOMY      There were no vitals filed for this visit.  Subjective Assessment - 07/22/18 0904    Subjective  Patient arrived with some soreness yet improved overall    Pertinent History  HTN, A-Fib    Limitations  House hold activities;Sitting    Currently in Pain?  Yes    Pain Score  3     Pain Location  Knee    Pain Orientation  Right    Pain Descriptors / Indicators  Discomfort    Pain  Type  Chronic pain    Pain Onset  More than a month ago    Pain Frequency  Intermittent    Aggravating Factors   increased activity    Pain Relieving Factors  rest and ice                       [facility] Adult PT Treatment/Exercise - 07/22/18 0001      Knee/Hip Exercises: Seated   Long Arc Quad  Strengthening;Right;3 sets;10 reps;Weights    Long Arc Quad Weight  2 lbs.    Clamshell with TheraBand  Red   RT LE only   Knee/Hip Flexion  2# RLE 3x10 reps    Other Seated Knee/Hip Exercises  Right LE heel raises x30 reps    Hamstring Curl  Strengthening;Right;3 sets;10 reps;Limitations    Hamstring Limitations  red t-band      Knee/Hip Exercises: Supine   Short Arc Quad Sets  AROM;Right;3 sets;10 reps    Short Arc Quad Sets Limitations  2#    Straight Leg Raises  Strengthening;Right;2 sets;10 reps    Other Supine Knee/Hip Exercises  clamshell with Rt knee only red t-band x30      Moist Heat Therapy   Number Minutes Moist Heat  15 Minutes    Moist Heat Location  Knee      Electrical Stimulation   Electrical Stimulation Location  right knee medial/lateral joint lines    Electrical Stimulation Action  premod    Electrical Stimulation Parameters  80-_0  x43mn    Electrical Stimulation Goals  Pain             PT Education - 07/22/18 0930    Education Details  HEP    Person(s) Educated  Patient    Methods  Explanation;Demonstration;Handout    Comprehension  Verbalized understanding;Returned demonstration          PT Long Term Goals - 07/22/18 0930      PT LONG TERM GOAL #1   Title  Patient will be independent with HEP.    Baseline  MET 07/22/18    Time  4    Period  Weeks    Status  Achieved      PT LONG TERM GOAL #2   Title  Patient will demonstrate 4+/5 or greater right knee strength to improve stabilization during functional tasks.    Time  4    Period  Weeks    Status  On-going      PT LONG TERM GOAL #3   Title  Patient will demonstrate 4/5  or greater right hip extension and abduction strength to improve stability during functional tasks.     Time  4    Period  Weeks    Status  On-going      PT LONG TERM GOAL #4   Title  Patient will report ability to perform all ADLs with less than 3/10 right knee pain.    Baseline  MET 07/22/18    Time  4    Period  Weeks    Status  Achieved   able to perform with 2/10 discomfort           Plan - 07/22/18 0932    Clinical Impression Statement  Patient tolerated treatment well today. Patient continues to have limitations with left knee yet improvement with right knee. Patient able to perform all activiities with right LE and pain 2/10 at most with ADL's per reported. Patient issued HEP for right LE exercises. Patient met LTG #1 and #3 today others ongoing due to strength deficits. Patient has appt with orthopedic MD next tuesday Re left knee.     Rehab Potential  Excellent    PT Frequency  2x / week    PT Duration  4 weeks    PT Treatment/Interventions  ADLs/Self Care Home Management;Iontophoresis 428mml Dexamethasone;Gait training;Stair training;Neuromuscular re-education;Passive range of motion;Manual techniques;Taping;Vasopneumatic Device;Patient/family education;Therapeutic exercise;Therapeutic activities;Functional mobility training;Moist Heat;Electrical Stimulation;Cryotherapy    PT Next Visit Plan   cont with POC for pain free right knee strengthening, hip strengthening, modalities PRN for pain relief. Issue HEP progression when appropriate NO EXERCISE WITH LEFT LE atthis time per MD    Consulted and Agree with Plan of Care  Patient       Patient will benefit from skilled therapeutic intervention in order to improve the following deficits and impairments:  Pain, Decreased strength  Visit Diagnosis: Acute pain of right knee  Muscle weakness (generalized)     Problem List Patient Active Problem List   Diagnosis Date Noted  . Atrial fibrillation with RVR ([place]   . Elevated troponin 06/05/2018  . Statin intolerance 09/01/2017  . [place]cere[place]  . Acute medial meniscal tear 07/03/2015  . Osteopenia 04/19/2014  . Palpitations 11/04/2013  . Arrhythmia  11/04/2013  . FIBRILLATION, ATRIAL 09/11/2010  . Dyslipidemia (high LDL; low HDL) 09/10/2010  . Essential hypertension 09/10/2010    Phillips Climes, PTA 07/22/2018, 9:42 AM  [facility] [place], [place], [address] Phone: [phone]   Fax:  [phone]  Name: Jaselyn Nahm MRN: [id number] Date of Birth: October 02, 1934

## 2018-07-22 NOTE — Patient Instructions (Signed)
  Knee Extension (Sitting)   Place __0-2__ pound weight on right ankle and straighten knee fully, lower slowly. Repeat _10___ times per set. Do __2-3__ sets per session. Do __2-3__ sessions per day.  Strengthening: Hip Abduction (Side-Lying)  Strengthening: Straight Leg Raise (Phase 1)  Repeat _10___ times per set. Do __2__ sets per session. Do __2__ sessions per day.  stretch 30 sec x5-10 2-3 x daily       Hip abduction   While sitting with good posture, tie theraband around right knee only and pull apart with right only . Slowly resume starting position. x30 1-2 x day

## 2018-07-27 DIAGNOSIS — M25562 Pain in left knee: Secondary | ICD-10-CM | POA: Diagnosis not present

## 2018-07-28 ENCOUNTER — Encounter: Payer: Self-pay | Admitting: Physical Therapy

## 2018-07-28 ENCOUNTER — Ambulatory Visit: Payer: Medicare HMO | Admitting: Physical Therapy

## 2018-07-28 DIAGNOSIS — M25561 Pain in right knee: Secondary | ICD-10-CM

## 2018-07-28 DIAGNOSIS — M6281 Muscle weakness (generalized): Secondary | ICD-10-CM | POA: Diagnosis not present

## 2018-07-28 NOTE — Therapy (Signed)
[facility] [place], [place], [address] Phone: [phone]   Fax:  [phone]  Physical Therapy Treatment  Patient Details  Name: Nancy Mccormick MRN: [id number] Date of Birth: 1933/12/22 Referring Provider (PT): Gaynelle Arabian, MD   Encounter Date: 07/28/2018  PT End of Session - 07/28/18 1120    Visit Number  7    Number of Visits  8    Date for PT Re-Evaluation  08/04/18    Authorization Type  Progress note every 10th visit; KX modifier at 15th visit    PT Start Time  1115    Activity Tolerance  Patient tolerated treatment well    Behavior During Therapy  [facility] for tasks assessed/performed       Past Medical History:  Diagnosis Date  . Arthritis    back, left shoulder, and fingers  . Atrial fibrillation ([place])   . Hyperlipidemia   . Hypertension   . Osteoporosis     Past Surgical History:  Procedure Laterality Date  . CATARACT EXTRACTION    . KNEE ARTHROSCOPY Right 07/04/2015   Procedure: RIGHT KNEE ARTHROSCOPY WITH MEDIAL MENISCAL DEBRIDEMENT AND CHONDROPLASTY;  Surgeon: Gaynelle Arabian, MD;  [facility];  Service: Orthopedics;  Laterality: Right;  . OPEN REDUCTION INTERNAL FIXATION (ORIF) DISTAL RADIAL FRACTURE Right 12/28/2012   Procedure: OPEN REDUCTION INTERNAL FIXATION (ORIF) DISTAL RADIAL FRACTURE;  Surgeon: Hessie Dibble, MD;  Location: [place];  Service: Orthopedics;  Laterality: Right;  Marland Kitchen VAGINAL HYSTERECTOMY      There were no vitals filed for this visit.  Subjective Assessment - 07/28/18 1117    Subjective  Patient reports a dull ache but no pain in the right knee. MRI for left knee on Nov. 4, Questionable for right knee MRI.    Pertinent History  HTN, A-Fib    Limitations  House hold activities;Sitting    Currently in Pain?  No/denies         [facility] PT Assessment - 07/28/18 0001      Assessment   Medical Diagnosis  right knee pain      Strength   Right Hip Flexion   4-/5    Right Hip Extension  4-/5    Right Hip ABduction  4/5    Right/Left Knee  Right    Right Knee Flexion  4+/5    Right Knee Extension  4+/5                   [facility] Adult PT Treatment/Exercise - 07/28/18 0001      Exercises   Exercises  Knee/Hip      Knee/Hip Exercises: Stretches   Passive Hamstring Stretch  Right;2 reps;30 seconds    Soleus Stretch  Right;2 reps;30 seconds   pro stretch     Knee/Hip Exercises: Seated   Long Arc Quad  Strengthening;Right;3 sets;10 reps;Weights    Long Arc Quad Weight  2 lbs.    Clamshell with TheraBand  Red   2x10 red   Knee/Hip Flexion  2# RLE 3x10 reps    Other Seated Knee/Hip Exercises  Right LE heel raises x30 reps    Hamstring Curl  Right;Strengthening;2 sets;15 reps    Hamstring Limitations  red t-band      Knee/Hip Exercises: Supine   Short Arc Quad Sets  AROM;Right;3 sets;10 reps    Short Arc Quad Sets Limitations  2#    Straight Leg Raises  Strengthening;Right;4 sets;10 reps      Modalities  Modalities  Electrical Stimulation;Moist Heat      Moist Heat Therapy   Number Minutes Moist Heat  15 Minutes    Moist Heat Location  Knee      Electrical Stimulation   Electrical Stimulation Location  right knee medial/lateral joint lines    Electrical Stimulation Action  pre-mod    Electrical Stimulation Parameters  80-150 hz x15 min    Electrical Stimulation Goals  Pain                  PT Long Term Goals - 07/28/18 1141      PT LONG TERM GOAL #1   Title  Patient will be independent with HEP.    Baseline  MET 07/22/18    Time  4    Period  Weeks    Status  Achieved      PT LONG TERM GOAL #2   Title  Patient will demonstrate 4+/5 or greater right knee strength to improve stabilization during functional tasks.    Time  4    Period  Weeks    Status  Achieved      PT LONG TERM GOAL #3   Title  Patient will demonstrate 4/5 or greater right hip extension and abduction strength to improve stability  during functional tasks.     Time  4    Period  Weeks    Status  Partially Met      PT LONG TERM GOAL #4   Title  Patient will report ability to perform all ADLs with less than 3/10 right knee pain.    Baseline  MET 07/22/18    Time  4    Period  Weeks    Status  Achieved            Plan - 07/28/18 1148    Clinical Impression Statement  Patient was able to tolerate treatment well with no reports of increased pain just muscle fatigue with LAQ. Patient was able to demonstrate good form with all exercises. Goals are partiall met. Normal response to modalities upon removal.    Clinical Presentation  Stable    Clinical Decision Making  Low    Rehab Potential  Excellent    PT Frequency  2x / week    PT Duration  4 weeks    PT Treatment/Interventions  ADLs/Self Care Home Management;Iontophoresis 46m/ml Dexamethasone;Gait training;Stair training;Neuromuscular re-education;Passive range of motion;Manual techniques;Taping;Vasopneumatic Device;Patient/family education;Therapeutic exercise;Therapeutic activities;Functional mobility training;Moist Heat;Electrical Stimulation;Cryotherapy    PT Next Visit Plan  Create HEP; discharge    Consulted and Agree with Plan of Care  Patient       Patient will benefit from skilled therapeutic intervention in order to improve the following deficits and impairments:  Pain, Decreased strength  Visit Diagnosis: Acute pain of right knee  Muscle weakness (generalized)     Problem List Patient Active Problem List   Diagnosis Date Noted  . Atrial fibrillation with RVR ([place] 06/05/2018  . Elevated troponin 06/05/2018  . Statin intolerance 09/01/2017  . [place] ([place]) ([place] 08/30/2017  . Acute medial meniscal tear 07/03/2015  . Osteopenia 04/19/2014  . Palpitations 11/04/2013  . Arrhythmia 11/04/2013  . FIBRILLATION, ATRIAL 09/11/2010  . Dyslipidemia (high LDL; low HDL) 09/10/2010  . Essential hypertension 09/10/2010     KGabriela Eves PT, DPT 07/28/2018, 12:17 PM  [facility] [address] [address]Phone: [phone]  Fax:  [phone] Name: MDeshunda ThackstonMRN: [id number]  Date of Birth: 06-Dec-1933

## 2018-07-29 ENCOUNTER — Ambulatory Visit: Payer: Medicare HMO | Admitting: Physical Therapy

## 2018-07-29 ENCOUNTER — Encounter: Payer: Self-pay | Admitting: Physical Therapy

## 2018-07-29 DIAGNOSIS — M6281 Muscle weakness (generalized): Secondary | ICD-10-CM | POA: Diagnosis not present

## 2018-07-29 DIAGNOSIS — M25561 Pain in right knee: Secondary | ICD-10-CM | POA: Diagnosis not present

## 2018-07-29 NOTE — Therapy (Signed)
[facility] [place], [place], [address] Phone: [phone]   Fax:  [phone]  Physical Therapy Treatment  Patient Details  Name: Nancy Mccormick MRN: [id number] Date of Birth: 01-Sep-1934 Referring Provider (PT): Gaynelle Arabian, MD   Encounter Date: 07/29/2018  PT End of Session - 07/29/18 0903    Visit Number  8    Number of Visits  8    Date for PT Re-Evaluation  08/04/18    Authorization Type  Progress note every 10th visit; KX modifier at 15th visit    PT Start Time  0900    PT Stop Time  0951    PT Time Calculation (min)  51 min    Activity Tolerance  Patient tolerated treatment well    Behavior During Therapy  [facility] for tasks assessed/performed       Past Medical History:  Diagnosis Date  . Arthritis    back, left shoulder, and fingers  . Atrial fibrillation ([place])   . Hyperlipidemia   . Hypertension   . Osteoporosis     Past Surgical History:  Procedure Laterality Date  . CATARACT EXTRACTION    . KNEE ARTHROSCOPY Right 07/04/2015   Procedure: RIGHT KNEE ARTHROSCOPY WITH MEDIAL MENISCAL DEBRIDEMENT AND CHONDROPLASTY;  Surgeon: Gaynelle Arabian, MD;  Location: [facility];  Service: Orthopedics;  Laterality: Right;  . OPEN REDUCTION INTERNAL FIXATION (ORIF) DISTAL RADIAL FRACTURE Right 12/28/2012   Procedure: OPEN REDUCTION INTERNAL FIXATION (ORIF) DISTAL RADIAL FRACTURE;  Surgeon: Hessie Dibble, MD;  Location: [place] [facility];  Service: Orthopedics;  Laterality: Right;  Marland Kitchen VAGINAL HYSTERECTOMY      There were no vitals filed for this visit.  Subjective Assessment - 07/29/18 0949    Subjective  Patient reported increase of pain in the right knee but stated it may be due to the change of weather.    Pertinent History  HTN, A-Fib    Limitations  House hold activities;Sitting    Currently in Pain?  Yes    Pain Score  2     Pain Orientation  Right    Pain Descriptors / Indicators  Discomfort    Pain  Type  Chronic pain    Pain Onset  More than a month ago    Pain Frequency  Intermittent         [facility] PT Assessment - 07/29/18 0001      Assessment   Medical Diagnosis  right knee pain      Observation/Other Assessments   Focus on Therapeutic Outcomes (FOTO)   27% limited                   [facility] Adult PT Treatment/Exercise - 07/29/18 0001      Exercises   Exercises  Knee/Hip      Knee/Hip Exercises: Stretches   Passive Hamstring Stretch  Right;2 reps;30 seconds    Gastroc Stretch  Right;3 reps;30 seconds    Soleus Stretch  Right;2 reps;30 seconds      Knee/Hip Exercises: Seated   Long Arc Quad  Strengthening;Right;3 sets;10 reps;Weights    Long Arc Quad Weight  2 lbs.    Clamshell with TheraBand  Red   3x10   Knee/Hip Flexion  2# RLE 3x10 reps    Other Seated Knee/Hip Exercises  Right LE heel raises x30 reps    Hamstring Curl  Right;Strengthening;2 sets;15 reps    Hamstring Limitations  red t-band      Knee/Hip Exercises: Supine  Short Arc [employer]  AROM;Right;3 sets;10 reps    Short Arc Quad Sets Limitations  2#    Heel Slides  AROM;Right;3 sets;10 reps    Straight Leg Raises  Strengthening;Right;4 sets;10 reps    Other Supine Knee/Hip Exercises  clamshell with Rt knee only red t-band x30      Modalities   Modalities  Electrical Stimulation;Moist Heat      Moist Heat Therapy   Number Minutes Moist Heat  15 Minutes    Moist Heat Location  Knee      Electrical Stimulation   Electrical Stimulation Location  right knee medial/lateral joint lines    Electrical Stimulation Action  Pre-mod    Electrical Stimulation Parameters  80-150 hz x15 min    Electrical Stimulation Goals  Pain                  PT Long Term Goals - 07/28/18 1141      PT LONG TERM GOAL #1   Title  Patient will be independent with HEP.    Baseline  MET 07/22/18    Time  4    Period  Weeks    Status  Achieved      PT LONG TERM GOAL #2   Title  Patient will  demonstrate 4+/5 or greater right knee strength to improve stabilization during functional tasks.    Time  4    Period  Weeks    Status  Achieved      PT LONG TERM GOAL #3   Title  Patient will demonstrate 4/5 or greater right hip extension and abduction strength to improve stability during functional tasks.     Time  4    Period  Weeks    Status  Partially Met      PT LONG TERM GOAL #4   Title  Patient will report ability to perform all ADLs with less than 3/10 right knee pain.    Baseline  MET 07/22/18    Time  4    Period  Weeks    Status  Achieved            Plan - 07/29/18 0946    Clinical Impression Statement  Patient was able to tolerate treatment well with no reports of increased pain. Patient was able to perform all exercises with good form. Patient provided with HEP of exercises performed today. Patient reported understanding. Normal response to modalities at end of session. Patient noted with improved FOTO score: 27% limitation. Patient to be discharged today.     Clinical Presentation  Stable    Clinical Decision Making  Low    Rehab Potential  Excellent    PT Frequency  2x / week    PT Duration  4 weeks    PT Treatment/Interventions  ADLs/Self Care Home Management;Iontophoresis 15m/ml Dexamethasone;Gait training;Stair training;Neuromuscular re-education;Passive range of motion;Manual techniques;Taping;Vasopneumatic Device;Patient/family education;Therapeutic exercise;Therapeutic activities;Functional mobility training;Moist Heat;Electrical Stimulation;Cryotherapy    PT Next Visit Plan  DC    Consulted and Agree with Plan of Care  Patient       Patient will benefit from skilled therapeutic intervention in order to improve the following deficits and impairments:  Pain, Decreased strength  Visit Diagnosis: Acute pain of right knee  Muscle weakness (generalized)     Problem List Patient Active Problem List   Diagnosis Date Noted  . Atrial fibrillation with  RVR ([place] 06/05/2018  . Elevated troponin 06/05/2018  . Statin intolerance 09/01/2017  . CVA (cerebral  vascular accident) ([place]) 08/30/2017  . Acute medial meniscal tear 07/03/2015  . Osteopenia 04/19/2014  . Palpitations 11/04/2013  . Arrhythmia 11/04/2013  . FIBRILLATION, ATRIAL 09/11/2010  . Dyslipidemia (high LDL; low HDL) 09/10/2010  . Essential hypertension 09/10/2010   PHYSICAL THERAPY DISCHARGE SUMMARY  Visits from Start of Care: 8  Current functional level related to goals / functional outcomes: See above   Remaining deficits: strength   Education / Equipment: HEP Plan: Patient agrees to discharge.  Patient goals were partially met. Patient is being discharged due to meeting the stated rehab goals.  ?????        Gabriela Eves, PT, DPT 07/29/2018, 9:58 AM  [facility] [address] [place], [place], [address] Phone: [phone]   Fax:  [phone]  Name: Nancy Mccormick MRN: [id number] Date of Birth: [birth year]

## 2018-08-04 ENCOUNTER — Ambulatory Visit: Payer: Medicare HMO | Admitting: Family Medicine

## 2018-08-09 DIAGNOSIS — M25562 Pain in left knee: Secondary | ICD-10-CM | POA: Diagnosis not present

## 2018-08-10 ENCOUNTER — Other Ambulatory Visit: Payer: Self-pay | Admitting: Family Medicine

## 2018-08-12 ENCOUNTER — Encounter: Payer: Self-pay | Admitting: Family Medicine

## 2018-08-18 ENCOUNTER — Ambulatory Visit: Payer: Medicare HMO | Admitting: Family Medicine

## 2018-08-18 ENCOUNTER — Encounter: Payer: Self-pay | Admitting: Family Medicine

## 2018-08-18 VITALS — BP 135/69 | HR 69 | Temp 96.6°F | Ht 64.0 in | Wt 187.0 lb

## 2018-08-18 DIAGNOSIS — I480 Paroxysmal atrial fibrillation: Secondary | ICD-10-CM

## 2018-08-18 DIAGNOSIS — G8929 Other chronic pain: Secondary | ICD-10-CM

## 2018-08-18 DIAGNOSIS — M25561 Pain in right knee: Secondary | ICD-10-CM | POA: Diagnosis not present

## 2018-08-18 DIAGNOSIS — E559 Vitamin D deficiency, unspecified: Secondary | ICD-10-CM

## 2018-08-18 DIAGNOSIS — Z8673 Personal history of transient ischemic attack (TIA), and cerebral infarction without residual deficits: Secondary | ICD-10-CM | POA: Diagnosis not present

## 2018-08-18 DIAGNOSIS — E7800 Pure hypercholesterolemia, unspecified: Secondary | ICD-10-CM

## 2018-08-18 DIAGNOSIS — M1711 Unilateral primary osteoarthritis, right knee: Secondary | ICD-10-CM

## 2018-08-18 DIAGNOSIS — I10 Essential (primary) hypertension: Secondary | ICD-10-CM

## 2018-08-18 DIAGNOSIS — M25562 Pain in left knee: Secondary | ICD-10-CM | POA: Diagnosis not present

## 2018-08-18 NOTE — Patient Instructions (Addendum)
Medicare Annual Wellness Visit  [facility] and the medical providers at [facility] strive to bring you the best medical care.  In doing so we not only want to address your current medical conditions and concerns but also to detect new conditions early and prevent illness, disease and health-related problems.    Medicare offers a yearly Wellness Visit which allows our clinical staff to assess your need for preventative services including immunizations, lifestyle education, counseling to decrease risk of preventable diseases and screening for fall risk and other medical concerns.    This visit is provided free of charge (no copay) for all Medicare recipients. The clinical pharmacists at [facility] have begun to conduct these Wellness Visits which will also include a thorough review of all your medications.    As you primary medical provider recommend that you make an appointment for your Annual Wellness Visit if you have not done so already this year.  You may set up this appointment before you leave today or you may call back ([phone]) and schedule an appointment.  Please make sure when you call that you mention that you are scheduling your Annual Wellness Visit with the clinical pharmacist so that the appointment may be made for the proper length of time.     Continue current medications. Continue good therapeutic lifestyle changes which include good diet and exercise. Fall precautions discussed with patient. If an FOBT was given today- please return it to our front desk. If you are over 77 years old - you may need Prevnar 57 or the adult Pneumonia vaccine.  **Flu shots are available--- please call and schedule a FLU-CLINIC appointment**  After your visit with [place] today you will receive a survey in the mail or online from [employer] regarding your care with [place]. Please take a moment to fill this out. Your feedback is very  important to [place] as you can help [place] better understand your patient needs as well as improve your experience and satisfaction. WE CARE ABOUT YOU!!!   Stay as active as possible Follow-up with orthopedist and cardiologist as planned Continue with current medicines Drink plenty of fluids and stay well-hydrated

## 2018-08-18 NOTE — Progress Notes (Signed)
Subjective:    Patient ID: Nancy Mccormick, female    DOB: [birth year], 84 y.o.   MRN: [id number]  HPI Pt here for follow up and management of chronic medical problems which includes hypertension and hyperlipidemia. She is taking medication regularly.  This patient has a history of arthritis hyperlipidemia hypertension and osteoporosis.  The patient's family history is positive for heart disease and stroke.  The patient has a history of stroke and atrial fibrillation and is on Eliquis 5 mg twice daily.  She is currently seeing the orthopedic surgeon a Lucio for bilateral knee pain.  She has had lab work done and this will be reviewed with her during the visit today.  Her blood sugar was elevated at 117.  The creatinine was normal and his serum sodium was slightly elevated and the potassium was good.  The CBC had a white blood cell count that was elevated at 13,500.  The plan was after this was done was to repeat another CBC which we will get today send it has been over 4 weeks.  Hemoglobin was good at 13.9 and the platelet count was adequate.  All thyroid tests were good including the TSH being normal.  The T3 uptake ratio was slightly decreased.  The B12 level was within normal limits.  The patient is pleasant and alert and is still living in her home here in [place] but plans to move to [place] in the spring 2020.  She is continues to see the cardiologist about every 6 months because of her atrial fib.  Today she is pleasant and denies any problem other than knee pain bilaterally with the left being worse than the right.  She does have a follow-up visit planned with the orthopedic surgeon soon.  She denies any chest pain pressure palpitations or shortness of breath anymore than usual she denies any trouble with swallowing heartburn indigestion nausea vomiting diarrhea blood in the stool or black tarry bowel movements or change in bowel habits.  She is passing her water well and has no burning or pain  just frequency which she has always had.  She is up-to-date on her eye exams.  She is pleasant and relaxed.     Patient Active Problem List   Diagnosis Date Noted  . Atrial fibrillation with RVR ([place]) 06/05/2018  . Elevated troponin 06/05/2018  . Statin intolerance 09/01/2017  . CVA (cerebral vascular accident) ([place]) 08/30/2017  . Acute medial meniscal tear 07/03/2015  . Osteopenia 04/19/2014  . Palpitations 11/04/2013  . Arrhythmia 11/04/2013  . FIBRILLATION, ATRIAL 09/11/2010  . Dyslipidemia (high LDL; low HDL) 09/10/2010  . Essential hypertension 09/10/2010   Outpatient Encounter Medications as of 08/18/2018  Medication Sig  . acetaminophen (TYLENOL) 500 MG tablet Take 1,000 mg by mouth every 6 (six) hours as needed for mild pain or moderate pain.  . Calcium Carbonate-Vitamin D (CALCIUM + D PO) Take 1 tablet by mouth at bedtime.  Marland Kitchen diltiazem (CARDIZEM CD) 240 MG 24 hr capsule Take 1 capsule (240 mg total) by mouth daily.  Marland Kitchen ELIQUIS 5 MG TABS tablet TAKE ONE TABLET BY MOUTH TWICE DAILY  . Multiple Vitamin (MULTIVITAMIN WITH MINERALS) TABS tablet Take 1 tablet by mouth at bedtime.  . nitroGLYCERIN (NITROSTAT) 0.4 MG SL tablet Place 1 tablet (0.4 mg total) under the tongue as needed.  Marland Kitchen omega-3 acid ethyl esters (LOVAZA) 1 g capsule TAKE ONE CAPSULE BY MOUTH TWICE A DAY  . Psyllium (NAT-RUL PSYLLIUM SEED HUSKS) 500 MG CAPS Take  2 capsules by mouth daily.  . rosuvastatin (CRESTOR) 5 MG tablet TAKE 1/2 TABLET TWICE A WEEK  . [DISCONTINUED] predniSONE (DELTASONE) 10 MG tablet Take 1 tablet (10 mg total) by mouth daily with breakfast.   No facility-administered encounter medications on file as of 08/18/2018.      Review of Systems  Constitutional: Negative.   HENT: Negative.   Eyes: Negative.   Respiratory: Negative.   Cardiovascular: Negative.   Gastrointestinal: Negative.   Endocrine: Negative.   Genitourinary: Negative.   Musculoskeletal: Positive for arthralgias (bilateral  knee pain = Allusio following).  Skin: Negative.   Allergic/Immunologic: Negative.   Neurological: Negative.   Hematological: Negative.   Psychiatric/Behavioral: Negative.        Objective:   Physical Exam  Constitutional: She is oriented to person, place, and time. She appears well-developed and well-nourished.  The patient is alert pleasant and smiling even though she lost her husband during the past year and being positive with her move so that she can be taken better care of as she ages.  HENT:  Head: Normocephalic and atraumatic.  Right Ear: External ear normal.  Left Ear: External ear normal.  Nose: Nose normal.  Mouth/Throat: Oropharynx is clear and moist.  Eyes: Pupils are equal, round, and reactive to light. Conjunctivae and EOM are normal. Right eye exhibits no discharge. Left eye exhibits no discharge. No scleral icterus.  Up-to-date on eye exams  Neck: Normal range of motion. Neck supple. No thyromegaly present.  No bruits thyromegaly or anterior cervical adenopathy  Cardiovascular: Normal rate, regular rhythm, normal heart sounds and intact distal pulses.  No murmur heard. The heart is regular today at 60/min  Pulmonary/Chest: Effort normal and breath sounds normal. She has no wheezes. She has no rales.  Clear anteriorly and posteriorly and no wheezes rales or rhonchi  Abdominal: Soft. Bowel sounds are normal. She exhibits no mass. There is no tenderness. There is no rebound and no guarding.  No liver or spleen enlargement.  No epigastric tenderness.  No suprapubic tenderness.  No masses or bruits.  Musculoskeletal: She exhibits tenderness. She exhibits no edema.  There is tenderness at the bilateral joint lines of both knees.  She has an upcoming visit after having an MRI with orthopedist with the orthopedist.  Lymphadenopathy:    She has no cervical adenopathy.  Neurological: She is alert and oriented to person, place, and time. She has normal reflexes. No cranial  nerve deficit.  Skin: Skin is warm and dry. No rash noted.  Psychiatric: She has a normal mood and affect. Her behavior is normal. Judgment and thought content normal.  The patient's mood affect and behavior are all normal and positive and uplifting.  Nursing note and vitals reviewed.  BP 135/69 (BP Location: Left Arm)   Pulse 69   Temp (!) 96.6 F (35.9 C) (Oral)   Ht 5\' 4"  (1.626 m)   Wt 187 lb (84.8 kg)   BMI 32.10 kg/m         Assessment & Plan:  1. Essential hypertension, benign -The blood pressure is good today and the patient will continue with her current treatment regimen - CBC with Differential/Platelet - Hepatic function panel  2. Pure hypercholesterolemia -Continue with Crestor and with as aggressive therapeutic lifestyle changes as possible including diet and exercise and continue with omega-3 fatty acids pending results of lab work - CBC with Differential/Platelet - Lipid panel  3. Paroxysmal atrial fibrillation (HCC) -Follow-up with cardiology as planned -  CBC with Differential/Platelet  4. Vitamin D deficiency -Continue with vitamin D replacement pending results of lab work - CBC with Differential/Platelet - VITAMIN D 25 Hydroxy (Vit-D Deficiency, Fractures)  5. Left knee pain, unspecified chronicity -Follow-up with orthopedist as planned  6. Chronic pain of right knee -Follow-up with orthopedist as planned  7. Primary osteoarthritis of right knee -Follow-up with orthopedist as planned  8. History of CVA (cerebrovascular accident) -Continue with Eliquis and continue to follow-up with cardiology as planned  Patient Instructions                       Medicare Annual Wellness Visit  [facility] and the medical providers at [facility] strive to bring you the best medical care.  In doing so we not only want to address your current medical conditions and concerns but also to detect new conditions early and prevent illness,  disease and health-related problems.    Medicare offers a yearly Wellness Visit which allows our clinical staff to assess your need for preventative services including immunizations, lifestyle education, counseling to decrease risk of preventable diseases and screening for fall risk and other medical concerns.    This visit is provided free of charge (no copay) for all Medicare recipients. The clinical pharmacists at [facility] have begun to conduct these Wellness Visits which will also include a thorough review of all your medications.    As you primary medical provider recommend that you make an appointment for your Annual Wellness Visit if you have not done so already this year.  You may set up this appointment before you leave today or you may call back ([phone]) and schedule an appointment.  Please make sure when you call that you mention that you are scheduling your Annual Wellness Visit with the clinical pharmacist so that the appointment may be made for the proper length of time.     Continue current medications. Continue good therapeutic lifestyle changes which include good diet and exercise. Fall precautions discussed with patient. If an FOBT was given today- please return it to our front desk. If you are over 45 years old - you may need Prevnar 80 or the adult Pneumonia vaccine.  **Flu shots are available--- please call and schedule a FLU-CLINIC appointment**  After your visit with [place] today you will receive a survey in the mail or online from [employer] regarding your care with [place]. Please take a moment to fill this out. Your feedback is very important to [place] as you can help [place] better understand your patient needs as well as improve your experience and satisfaction. WE CARE ABOUT YOU!!!   Stay as active as possible Follow-up with orthopedist and cardiologist as planned Continue with current medicines Drink plenty of fluids and stay well-hydrated  Arrie Senate MD

## 2018-08-20 DIAGNOSIS — S83262D Peripheral tear of lateral meniscus, current injury, left knee, subsequent encounter: Secondary | ICD-10-CM | POA: Diagnosis not present

## 2018-08-20 DIAGNOSIS — S83222D Peripheral tear of medial meniscus, current injury, left knee, subsequent encounter: Secondary | ICD-10-CM | POA: Diagnosis not present

## 2018-08-20 DIAGNOSIS — M25562 Pain in left knee: Secondary | ICD-10-CM | POA: Diagnosis not present

## 2018-08-20 LAB — CBC WITH DIFFERENTIAL/PLATELET
BASOS: 1 %
Basophils Absolute: 0.1 10*3/uL (ref 0.0–0.2)
EOS (ABSOLUTE): 0.2 10*3/uL (ref 0.0–0.4)
Eos: 2 %
HEMOGLOBIN: 13.3 g/dL (ref 11.1–15.9)
Hematocrit: 41.2 % (ref 34.0–46.6)
IMMATURE GRANULOCYTES: 1 %
Immature Grans (Abs): 0.1 10*3/uL (ref 0.0–0.1)
Lymphocytes Absolute: 3 10*3/uL (ref 0.7–3.1)
Lymphs: 30 %
MCH: 30.1 pg (ref 26.6–33.0)
MCHC: 32.3 g/dL (ref 31.5–35.7)
MCV: 93 fL (ref 79–97)
MONOS ABS: 0.9 10*3/uL (ref 0.1–0.9)
Monocytes: 9 %
NEUTROS PCT: 57 %
Neutrophils Absolute: 5.9 10*3/uL (ref 1.4–7.0)
Platelets: 395 10*3/uL (ref 150–450)
RBC: 4.42 x10E6/uL (ref 3.77–5.28)
RDW: 13.8 % (ref 12.3–15.4)
WBC: 10.1 10*3/uL (ref 3.4–10.8)

## 2018-08-20 LAB — VITAMIN D 25 HYDROXY (VIT D DEFICIENCY, FRACTURES): VIT D 25 HYDROXY: 30.5 ng/mL (ref 30.0–100.0)

## 2018-08-20 LAB — HEPATIC FUNCTION PANEL
ALT: 13 IU/L (ref 0–32)
AST: 13 IU/L (ref 0–40)
Albumin: 4.3 g/dL (ref 3.5–4.7)
Alkaline Phosphatase: 70 IU/L (ref 39–117)
BILIRUBIN TOTAL: 0.7 mg/dL (ref 0.0–1.2)
BILIRUBIN, DIRECT: 0.18 mg/dL (ref 0.00–0.40)
Total Protein: 6.2 g/dL (ref 6.0–8.5)

## 2018-08-20 LAB — LIPID PANEL
CHOLESTEROL TOTAL: 222 mg/dL — AB (ref 100–199)
Chol/HDL Ratio: 2.5 ratio (ref 0.0–4.4)
HDL: 88 mg/dL (ref >39)
LDL Calculated: 101 mg/dL — ABNORMAL HIGH (ref 0–99)
TRIGLYCERIDES: 163 mg/dL — AB (ref 0–149)
VLDL Cholesterol Cal: 33 mg/dL (ref 5–40)

## 2018-08-24 ENCOUNTER — Telehealth: Payer: Self-pay

## 2018-08-24 NOTE — Telephone Encounter (Signed)
Patient with diagnosis of Afib on Eliquis for anticoagulation.    Procedure: left knee arthroscopy/meniscal debridement Date of procedure: 09/21/18  CHADS2-VASc score of  6 (CHF, HTN, AGE, DM2, stroke/tia x 2, CAD, AGE, female)  CrCl 81ml/min  Per office protocol, patient can hold Eliquis for 24 hours prior to procedure.  With history of stroke would recommend resume Eliquis as soon as safe post procedure.

## 2018-08-24 NOTE — Telephone Encounter (Signed)
   [facility] Pre-operative Risk Assessment    Request for surgical clearance:  1. What type of surgery is being performed? Left knee arthroscopy,meniscal debridement  2. When is this surgery scheduled? 09/21/18  3. What type of clearance is required (medical clearance vs. Pharmacy clearance to hold med vs. Both)? Both  4. Are there any medications that need to be held prior to surgery and how long? Eliquis  5. Practice name and name of physician performing surgery? [facility]  Dr.Aluiso   6. What is your office phone number [phone]   7.   What is your office fax number [phone]  8.   Anesthesia type Choice   Kathyrn Lass 08/24/2018, 1:52 PM  _________________________________________________________________   (provider comments below)

## 2018-08-25 ENCOUNTER — Other Ambulatory Visit: Payer: Self-pay | Admitting: Family Medicine

## 2018-08-25 DIAGNOSIS — L821 Other seborrheic keratosis: Secondary | ICD-10-CM | POA: Diagnosis not present

## 2018-08-25 DIAGNOSIS — L918 Other hypertrophic disorders of the skin: Secondary | ICD-10-CM | POA: Diagnosis not present

## 2018-08-25 DIAGNOSIS — D1801 Hemangioma of skin and subcutaneous tissue: Secondary | ICD-10-CM | POA: Diagnosis not present

## 2018-08-25 NOTE — Telephone Encounter (Signed)
   Primary Cardiologist: Dr Percival Spanish  Chart reviewed and patient interviewed over the phone today as part of pre-operative protocol coverage. Given past medical history and time since last visit, based on ACC/AHA guidelines, Nancy Mccormick would be at acceptable risk for the planned procedure without further cardiovascular testing.   Per our office pharmacy protocol, the patient can hold Eliquis for 24 hours prior to procedure.  With her history of stroke would recommend resuming Eliquis as soon as safe post procedure.   I will route this recommendation to the requesting party via Epic fax function and remove from pre-op pool.  Please call with questions.  Kerin Ransom, PA-C 08/25/2018, 3:26 PM

## 2018-08-26 ENCOUNTER — Ambulatory Visit (INDEPENDENT_AMBULATORY_CARE_PROVIDER_SITE_OTHER): Payer: Medicare HMO | Admitting: *Deleted

## 2018-08-26 ENCOUNTER — Encounter: Payer: Self-pay | Admitting: *Deleted

## 2018-08-26 VITALS — BP 126/68 | HR 55 | Ht 62.5 in | Wt 188.0 lb

## 2018-08-26 DIAGNOSIS — Z0000 Encounter for general adult medical examination without abnormal findings: Secondary | ICD-10-CM

## 2018-08-26 DIAGNOSIS — Z23 Encounter for immunization: Secondary | ICD-10-CM

## 2018-08-26 NOTE — Patient Instructions (Signed)
Nancy Mccormick , Thank you for taking time to come for your Medicare Wellness Visit. I appreciate your ongoing commitment to your health goals. Please review the following plan we discussed and let me know if I can assist you in the future.   These are the goals we discussed: Goals    . Exercise 150 min/wk Moderate Activity       This is a list of the screening recommended for you and due dates:  Health Maintenance  Topic Date Due  . Tetanus Vaccine  09/05/2016  . Mammogram  06/04/2019  . DEXA scan (bone density measurement)  Completed  . Pneumonia vaccines  Completed  . Flu Shot  Discontinued    Diphtheria/Tetanus Toxoids; Pertussis Vaccine, DTP injection What is this medicine? DIPHTHERIA and TETANUS TOXOIDS; PERTUSSIS VACCINE (dif THEER ee uh and TET n [place] TOK soids; per TUS iss VAK seen) is used to prevent diphtheria, tetanus, and pertussis infections. This medicine may be used for other purposes; ask your health care provider or pharmacist if you have questions. COMMON BRAND NAME(S): Adacel, Boostrix, Certiva, Daptacel, Infanrix, Tripedia What should I tell my health care provider before I take this medicine? They need to know if you have any of these conditions: -blood disorders like hemophilia -fever or infection -immune system problems -neurologic disease -seizures -an unusual or allergic reaction to vaccines, thimerosal, latex, other medicines, foods, dyes, or preservatives -pregnant or trying to get pregnant -breast-feeding How should I use this medicine? This vaccine is for injection into a muscle. It is given by a health care professional. A copy of Vaccine Information Statements will be given before each vaccination. Read this sheet carefully each time. The sheet may change frequently. Talk to your pediatrician regarding the use of this vaccine in children. While the DTP vaccine may be given to children ages 53 weeks to 7 years and the Tdap vaccine may be given to  children at least 25 years old, precautions do apply. Overdosage: If you think you have taken too much of this medicine contact a poison control center or emergency room at once. NOTE: This medicine is only for you. Do not share this medicine with others. What if I miss a dose? It is important not to miss your dose. Call your doctor or health care professional if you are unable to keep an appointment. What may interact with this medicine? -immune globulin -medicines that suppress your immune function like adalimumab, anakinra, infliximab -medicines to treat cancer -medicines that treat or prevent blood clots like warfarin, enoxaparin, and dalteparin -steroid medicines like prednisone or cortisone This list may not describe all possible interactions. Give your health care provider a list of all the medicines, herbs, non-prescription drugs, or dietary supplements you use. Also tell them if you smoke, drink alcohol, or use illegal drugs. Some items may interact with your medicine. What should I watch for while using this medicine? See your health care provider for all shots of this vaccine as directed. To have protection from infection, you must have 3 shots of this vaccine plus boosters as needed. Tell your doctor right away if you have any serious or unusual side effects after getting this vaccine. What side effects may I notice from receiving this medicine? Side effects that you should report to your doctor or health care professional as soon as possible: -allergic reactions like skin rash, itching or hives, swelling of the face, lips, or tongue -breathing problems -fever of 103 degrees F or more -flu-like symptoms -  inconsolable crying -infection -pain, tingling, numbness in the hands or feet -seizures -swelling of arm or leg that was injected -unusually weak or tired Side effects that usually do not require medical attention (report to your doctor or health care professional if they  continue or are bothersome): -fussy, irritable -loss of appetite -low-grade fever -pain, tenderness, redness, swelling, or a 'knot' at site where injected -vomiting This list may not describe all possible side effects. Call your doctor for medical advice about side effects. You may report side effects to FDA at 1-800-FDA-1088. Where should I keep my medicine? This drug is given in a hospital or clinic and will not be stored at home. NOTE: This sheet is a summary. It may not cover all possible information. If you have questions about this medicine, talk to your doctor, pharmacist, or health care provider.  2018 Elsevier/Gold Standard (2015-10-25 12:43:42)

## 2018-08-26 NOTE — Progress Notes (Addendum)
Subjective:   Nancy Mccormick is a 84 y.o. female who presents for a subsequent Medicare Annual Wellness Visit.  Patient Care Team: Chipper Herb, MD as PCP - General (Family Medicine) Gaynelle Arabian, MD as Consulting Physician (Orthopedic Surgery)  Hospitalizations, surgeries, and ER visits in previous 12 months 06/05/18 ED to admission for Afib and 08/30/17 ED to admission for CVA  Review of Systems    Patient reports that her overall health is unchanged compared to last year.  Cardiac Risk Factors include: advanced age (>75men, >21 women);dyslipidemia;hypertension  Musculoskeletal: bilateral knee pain. Torn meniscus right left knee. Scheduled for outpatient surgery next month. Right knee pain and swelling as well. Had physical therapy with no improvement. Using ice in the evenings. Following up with Dr Wynelle Link for MRI.   Neurology: some difficulty with concentration since CVA. She has been able to adapt though and it hasn't had a major impact on her ability to function normally.   Psych: Husband passed away in 02-20-2018 after an extended illness. She is dealing with his death reasonably well and does not have any symptoms of depression. She recognizes that sadness is a normal part of the process and that has been improving over time as well. She tries to stay active and social and says that if she starts to feel down then she will get up and go do something.   All other systems negative       Current Medications (verified) Outpatient Encounter Medications as of 08/26/2018  Medication Sig  . acetaminophen (TYLENOL) 500 MG tablet Take 1,000 mg by mouth every 6 (six) hours as needed for mild pain or moderate pain.  . Calcium Carbonate-Vitamin D (CALCIUM + D PO) Take 1 tablet by mouth at bedtime.  Marland Kitchen diltiazem (CARDIZEM CD) 240 MG 24 hr capsule Take 1 capsule (240 mg total) by mouth daily.  Marland Kitchen ELIQUIS 5 MG TABS tablet TAKE ONE TABLET BY MOUTH TWICE DAILY  . Multiple Vitamin  (MULTIVITAMIN WITH MINERALS) TABS tablet Take 1 tablet by mouth at bedtime.  . nitroGLYCERIN (NITROSTAT) 0.4 MG SL tablet Place 1 tablet (0.4 mg total) under the tongue as needed.  Marland Kitchen omega-3 acid ethyl esters (LOVAZA) 1 g capsule TAKE ONE CAPSULE BY MOUTH TWICE A DAY  . Psyllium (NAT-RUL PSYLLIUM SEED HUSKS) 500 MG CAPS Take 2 capsules by mouth daily.  . rosuvastatin (CRESTOR) 5 MG tablet TAKE 1/2 TABLET TWICE A WEEK   No facility-administered encounter medications on file as of 08/26/2018.     Allergies (verified) Shellfish allergy; Atorvastatin; Crestor [rosuvastatin]; and Livalo [pitavastatin]   History: Past Medical History:  Diagnosis Date  . Arthritis    back, left shoulder, and fingers  . Atrial fibrillation ([place])   . Hyperlipidemia   . Hypertension   . Osteoporosis    Past Surgical History:  Procedure Laterality Date  . CATARACT EXTRACTION    . KNEE ARTHROSCOPY Right 07/04/2015   Procedure: RIGHT KNEE ARTHROSCOPY WITH MEDIAL MENISCAL DEBRIDEMENT AND CHONDROPLASTY;  Surgeon: Gaynelle Arabian, MD;  Location: [facility];  Service: Orthopedics;  Laterality: Right;  . OPEN REDUCTION INTERNAL FIXATION (ORIF) DISTAL RADIAL FRACTURE Right 12/28/2012   Procedure: OPEN REDUCTION INTERNAL FIXATION (ORIF) DISTAL RADIAL FRACTURE;  Surgeon: Hessie Dibble, MD;  Location: [facility];  Service: Orthopedics;  Laterality: Right;  Marland Kitchen VAGINAL HYSTERECTOMY     Family History  Problem Relation Age of Onset  . Stroke Mother 41  . Anemia Mother   . Heart disease Mother   .  Stroke Father 82  . Stroke Sister 28  . Hypertension Son    Social History   Socioeconomic History  . Marital status: Widowed    Spouse name: Not on file  . Number of children: 3  . Years of education: Not on file  . Highest education level: Not on file  Occupational History  . 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 Financial resource strain: Not very hard    . Food insecurity:    Worry: Never true    Inability: Never true  . Transportation needs:    Medical: No    Non-medical: No  Tobacco Use  . Smoking status: Former Smoker    Types: Cigarettes    Last attempt to quit: 12/17/1986    Years since quitting: 31.7  . Smokeless tobacco: Never Used  . Tobacco comment: Smoke briefly in the distant past  Substance and Sexual Activity  . Alcohol use: No  . Drug use: No  . Sexual activity: Not Currently  Lifestyle  . Physical activity:    Days per week: 0 days    Minutes per session: Not on file  . Stress: Rather much  Relationships  . Social connections:    Talks on phone: More than three times a week    Gets together: More than three times a week    Attends religious service: More than 4 times per year    Active member of club or organization: No    Attends meetings of clubs or organizations: Never    Relationship status: Married  Other Topics Concern  . Not on file  Social History Narrative   Nancy Mccormick lives at home alone since her husband passed in April 2019. She has three sons and three grandsons.None live locally but she gets to talk with them often and sees them pretty regularly. 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  She enjoys having lunch out with friends regularly. She plans to move to a     Clinical Intake:  Pre-visit preparation completed: No  Pain : 0-10 Pain Type: Chronic pain Pain Location: Knee Pain Orientation: Right, Left Pain Descriptors / Indicators: Aching Pain Onset: More than a month ago Pain Frequency: Constant Effect of Pain on Daily Activities: Moderate. Tries to as much as possible.      Nutritional Status: (3 meals a day. May have a protein shake for breakfast or cereal and then eats out with friends  often for lunch or has cottage cheese and pears. Eats a lot of salads and a lot of vegetables. Has mostly water. Does drink caffeinated coffee or tea. ) Nutritional Risks: None Diabetes: No  How often do you need to have someone help you when you read instructions, pamphlets, or other written materials from your doctor or pharmacy?: 1 - Never What is the last grade level you completed in school?: Masters degree  Interpreter Needed?: No  Information entered by :: Chong Sicilian, RN   Activities of Daily Living In your present state of health, do you have any difficulty performing the following activities: 08/26/2018 06/05/2018  Hearing? N N  Comment has noticed that she has to say "what" a little more often -  Vision? N N  Comment has some difficulty at night driving. Sees eye doctor yearly.  -  Difficulty concentrating or making decisions? Y N  Comment some difficulty with focus since having a stroke. She is able to work on tasks if she has a quiet space and time to do it -  Walking or climbing stairs? Y N  Comment some difficulty due to bilateral knee pain -  Dressing or bathing? N N  Doing errands, shopping? N N  Preparing Food and eating ? N -  Using the Toilet? N -  In the past six months, have you accidently leaked urine? Y -  Comment some urge incontinence and nocturia x 2 or 3 times a night -  Do you have problems with loss of bowel control? N -  Managing your Medications? N -  Managing your Finances? N -  Housekeeping or managing your Housekeeping? N -  Some recent data might be hidden     Exercise Current Exercise Habits: Home exercise routine, Type of exercise: walking, Time (Minutes): 30, Frequency (Times/Week): 5, Weekly Exercise (Minutes/Week): 150, Intensity: Mild, Exercise limited by: orthopedic condition(s)(left knee meniscal tear and pending surgery and right knee injury but no MRI yet)   Depression Screen PHQ 2/9 Scores 08/26/2018 08/18/2018 07/14/2018 06/24/2018  05/17/2018 03/31/2018 12/11/2017  PHQ - 2 Score 0 0 0 0 0 0 0  PHQ- 9 Score - - - - - - -     Fall Risk Fall Risk  08/26/2018 08/18/2018 07/14/2018 06/24/2018 05/17/2018  Falls in the past year? 0 0 No No No  Comment No stairs at home. has a tub without handrails but does not have trouble getting in and out.  - - - -  Number falls in past yr: 0 - - - -  Injury with Fall? 0 - - - -  Comment - - - - -  Follow up Falls prevention discussed - - - -     Objective:    Today's Vitals   08/26/18 0845  BP: 126/68  Pulse: (!) 55  Weight: 188 lb (85.3 kg)  Height: 5' 2.5" (1.588 m)   Body mass index is 33.84 kg/m.  Advanced Directives 08/26/2018 06/30/2018 06/05/2018 06/05/2018 08/30/2017 07/04/2015 06/26/2015  Does Patient Have a Medical Advance Directive? Yes Yes Yes Yes No No No  Type of Paramedic of [place];Living will [place];Living will [place];Living will [place];Living will - - -  Does patient want to make changes to medical advance directive? No - Patient declined - No - Patient declined - - - -  Copy of [place] in Chart? Yes - validated most recent copy scanned in chart (See row information) - Yes - - - -  Would patient like information on creating a medical advance directive? - - - - No - Patient declined No - patient declined information No - patient declined information  Pre-existing out of facility DNR order (yellow form or pink MOST form) - - - - - - -    Hearing/Vision  No hearing or vision deficits noted during visit.  Cognitive Function: MMSE - Mini Mental State Exam 08/31/2017 08/25/2017 02/20/2015  Not completed: - - Refused  Orientation to time 5 5 4   Orientation to Place 5 5 5   Registration 3 3 3   Attention/ Calculation 5 5 5   Recall 3 3 3   Language- name 2 objects 1 2 2   Language- repeat 1 1 1  Language- follow 3 step command 3 3 3   Language- read & follow  direction 1 1 1   Write a sentence 1 1 1   Copy design 1 1 1   Total score 29 30 29      6CIT Screen 08/26/2018  What Year? 0 points  What month? 0 points  What time? 0 points  Count back from 20 0 points  Months in reverse 0 points  Repeat phrase 0 points  Total Score 0   Normal Cognitive Function Screening: Yes    Immunizations and Health Maintenance Immunization History  Administered Date(s) Administered  . Pneumococcal Conjugate-13 12/08/2013  . Pneumococcal Polysaccharide-23 11/11/2016   Health Maintenance Due  Topic Date Due  . TETANUS/TDAP  09/05/2016   Health Maintenance  Topic Date Due  . Samul Dada  09/05/2016  . MAMMOGRAM  06/04/2019  . DEXA SCAN  Completed  . PNA vac Low Risk Adult  Completed  . INFLUENZA VACCINE  Discontinued        Assessment:   This is a routine wellness examination for Nancy Mccormick.    Plan:    Goals    . Exercise 150 min/wk Moderate Activity        Health Maintenance & Additional Screening Recommendations  Td vaccine   Lung: Low Dose CT Chest recommended if Age 31-80 years, 30 pack-year currently smoking OR have quit w/in 15years. Patient does not qualify. Hepatitis C Screening recommended: no HIV Screening recommended: no  Today's Orders Orders Placed This Encounter  Procedures  . Tdap vaccine greater than or equal to 7yo IM  Given today   Keep f/u with Chipper Herb, MD and any other specialty appointments you may have Continue current medications Move carefully to avoid falls. Use assistive devices like a cane or walker if needed. Aim for at least 150 minutes of moderate activity a week. Continue with seated exercises until after knee surgery then try to increase activity to walking daily Empty bladder every 2 or 3 hours to decrease urge incontinence Read or work on puzzles daily Stay connected with friends and family  I have personally reviewed and noted the following in the patient's chart:   . Medical and  social history . Use of alcohol, tobacco or illicit drugs  . Current medications and supplements . Functional ability and status . Nutritional status . Physical activity . Advanced directives . List of other physicians . Hospitalizations, surgeries, and ER visits in previous 12 months . Vitals . Screenings to include cognitive, depression, and falls . Referrals and appointments  In addition, I have reviewed and discussed with patient certain preventive protocols, quality metrics, and best practice recommendations. A written personalized care plan for preventive services as well as general preventive health recommendations were provided to patient.     Chong Sicilian, RN   08/26/2018    I have reviewed and agree with the above AWV documentation.   Mary-Margaret Hassell Done, FNP

## 2018-09-21 DIAGNOSIS — M94262 Chondromalacia, left knee: Secondary | ICD-10-CM | POA: Diagnosis not present

## 2018-09-21 DIAGNOSIS — S83262D Peripheral tear of lateral meniscus, current injury, left knee, subsequent encounter: Secondary | ICD-10-CM | POA: Diagnosis not present

## 2018-09-21 DIAGNOSIS — S83222D Peripheral tear of medial meniscus, current injury, left knee, subsequent encounter: Secondary | ICD-10-CM | POA: Diagnosis not present

## 2018-09-21 DIAGNOSIS — S83242A Other tear of medial meniscus, current injury, left knee, initial encounter: Secondary | ICD-10-CM | POA: Diagnosis not present

## 2018-09-21 DIAGNOSIS — M23322 Other meniscus derangements, posterior horn of medial meniscus, left knee: Secondary | ICD-10-CM | POA: Diagnosis not present

## 2018-09-21 DIAGNOSIS — M23352 Other meniscus derangements, posterior horn of lateral meniscus, left knee: Secondary | ICD-10-CM | POA: Diagnosis not present

## 2018-09-21 DIAGNOSIS — G8918 Other acute postprocedural pain: Secondary | ICD-10-CM | POA: Diagnosis not present

## 2018-09-21 DIAGNOSIS — S83282A Other tear of lateral meniscus, current injury, left knee, initial encounter: Secondary | ICD-10-CM | POA: Diagnosis not present

## 2018-09-21 DIAGNOSIS — M23252 Derangement of posterior horn of lateral meniscus due to old tear or injury, left knee: Secondary | ICD-10-CM | POA: Diagnosis not present

## 2018-10-01 ENCOUNTER — Telehealth: Payer: Self-pay | Admitting: Family Medicine

## 2018-10-01 MED ORDER — ROSUVASTATIN CALCIUM 5 MG PO TABS: 15.0000 mg | ORAL_TABLET | ORAL | 3 refills | Status: DC

## 2018-10-01 NOTE — Telephone Encounter (Signed)
PT is wanting to speak to Dr Georgia Duff nurse about her rosuvastatin (CRESTOR) 5 MG tablet, he changed the way she was taking it and now she has ran out.

## 2018-10-01 NOTE — Telephone Encounter (Signed)
Pt called and crestor fixed

## 2018-10-19 DIAGNOSIS — Z5189 Encounter for other specified aftercare: Secondary | ICD-10-CM | POA: Diagnosis not present

## 2018-10-19 DIAGNOSIS — M25562 Pain in left knee: Secondary | ICD-10-CM | POA: Diagnosis not present

## 2018-11-16 DIAGNOSIS — Z5189 Encounter for other specified aftercare: Secondary | ICD-10-CM | POA: Insufficient documentation

## 2018-11-18 ENCOUNTER — Other Ambulatory Visit: Payer: Self-pay | Admitting: Family Medicine

## 2018-12-21 ENCOUNTER — Other Ambulatory Visit: Payer: Self-pay | Admitting: Family Medicine

## 2018-12-27 IMAGING — MR MR HEAD W/O CM
8 of 10 series · 41 of 48 positions shown · non-contrast
Comparison: Head CT and CTA head and neck 08/30/2017.

CLINICAL DATA: 83-year-old female with weakness and slurred speech
for 2 days.

EXAM:
MRI HEAD WITHOUT CONTRAST
TECHNIQUE: Multiplanar, multiecho pulse sequences of the brain and surrounding
structures were obtained without intravenous contrast.

[Series 3: DWI · axial · 3.0mm · 0.69mm/px · z∈[-34,+128]mm · 7 of 55 slices shown (1 of 4)]
[im 1/55]
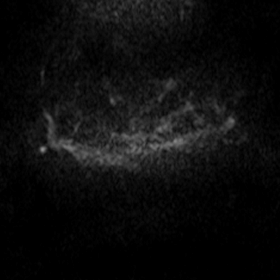
[im 10/55]
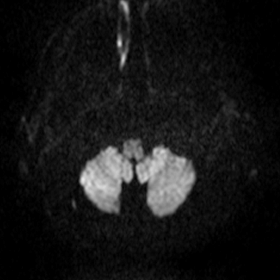
[im 19/55]
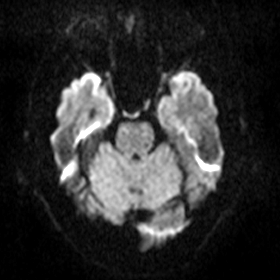
[im 28/55]
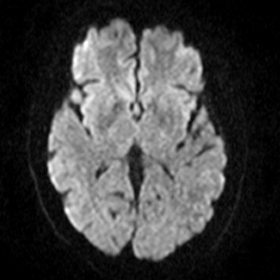
[im 37/55]
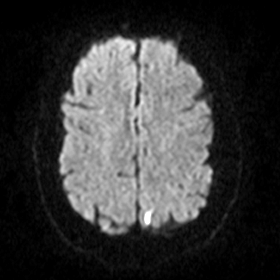
[im 46/55]
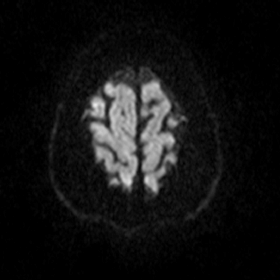
[im 55/55]
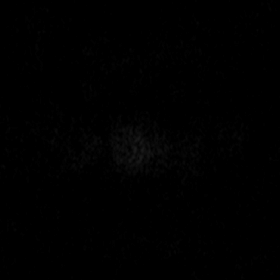

[Series 4: DWI · axial · 3.0mm · 0.74mm/px · z∈[-34,+128]mm · 7 of 55 slices shown (2 of 4)]
[im 1/55]
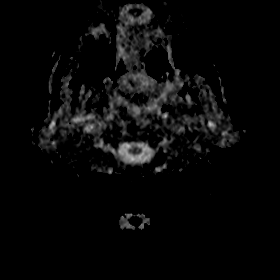
[im 10/55]
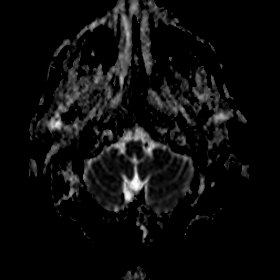
[im 19/55]
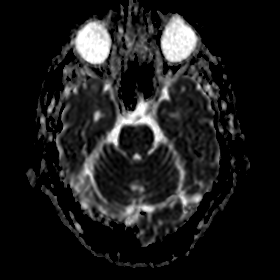
[im 28/55]
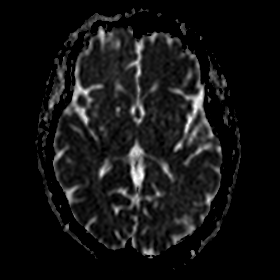
[im 37/55]
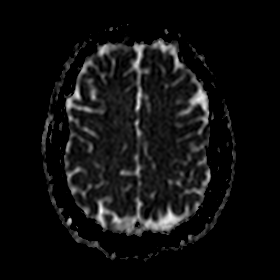
[im 46/55]
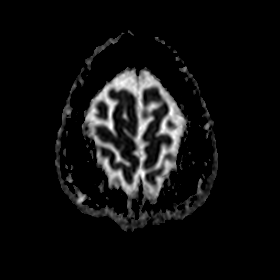
[im 55/55]
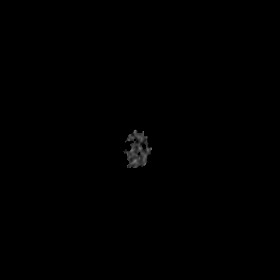

[Series 5: DWI · coronal · 5.0mm · 0.48mm/px · 4 of 34 slices shown (3 of 4)]
[im 1/34]
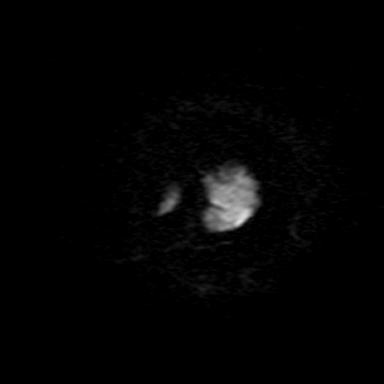
[im 12/34]
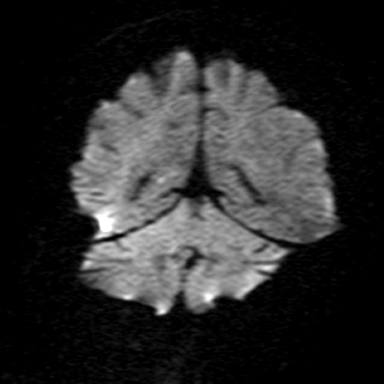
[im 23/34]
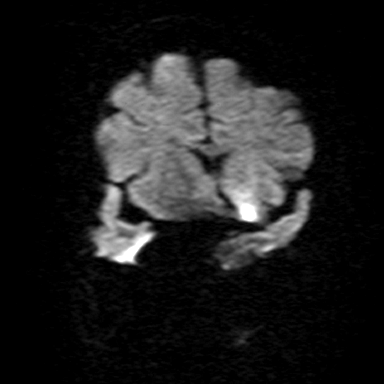
[im 34/34]
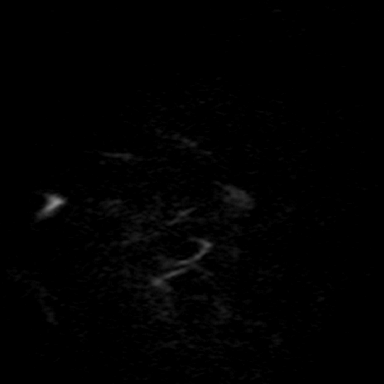

[Series 6: DWI · coronal · 5.0mm · 0.52mm/px · 4 of 33 slices shown (4 of 4)]
[im 1/33]
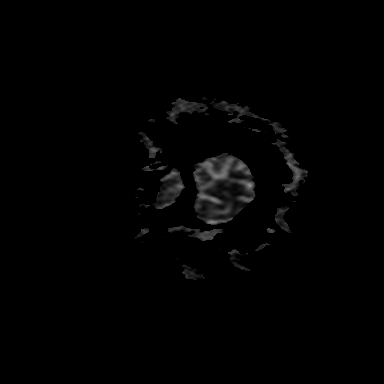
[im 11/33]
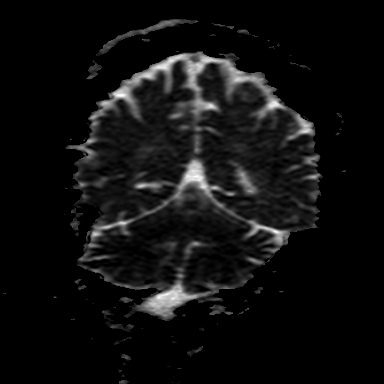
[im 22/33]
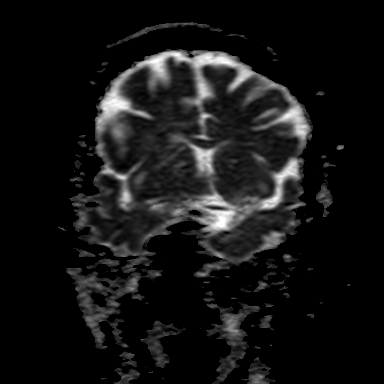
[im 33/33]
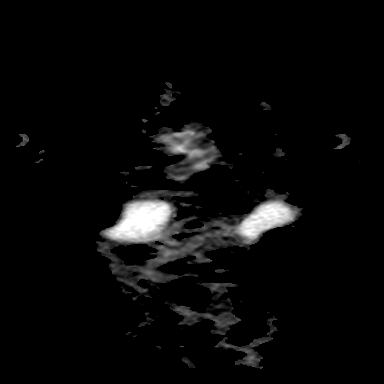

[Series 7: T2 · axial · 5.0mm · 0.67mm/px · z∈[-25,+118]mm · 3 of 23 slices shown (1 of 2)]
[im 1/23]
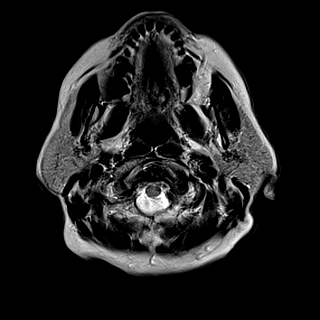
[im 12/23]
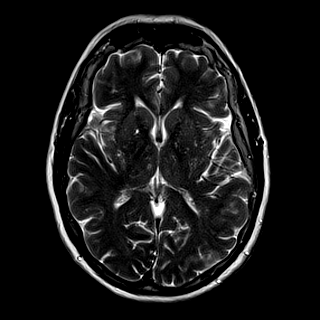
[im 23/23]
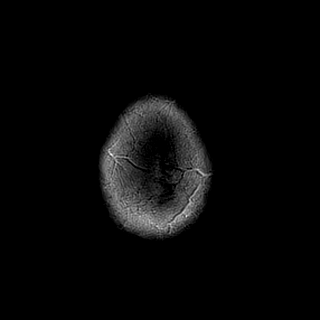

[Series 8: FLAIR · axial · 3.0mm · 0.79mm/px · z∈[-22,+116]mm · 6 of 47 slices shown]
[im 1/47]
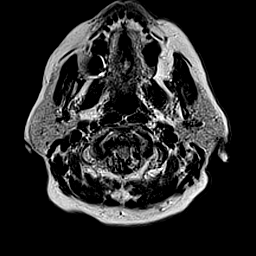
[im 10/47]
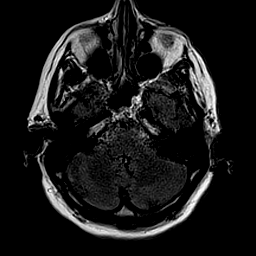
[im 19/47]
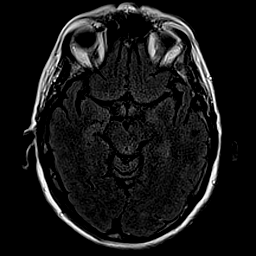
[im 28/47]
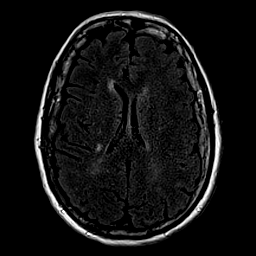
[im 37/47]
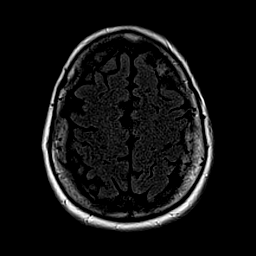
[im 47/47]
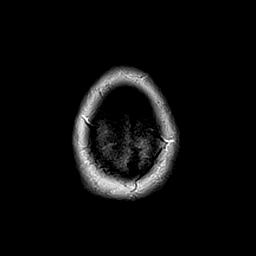

[Series 9: T1 · axial · 2.0mm · 0.40mm/px · z∈[-24,+104]mm · 7 of 75 slices shown]
[im 1/75]
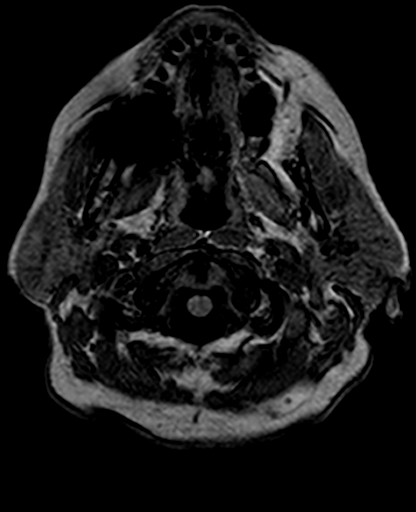
[im 10/75]
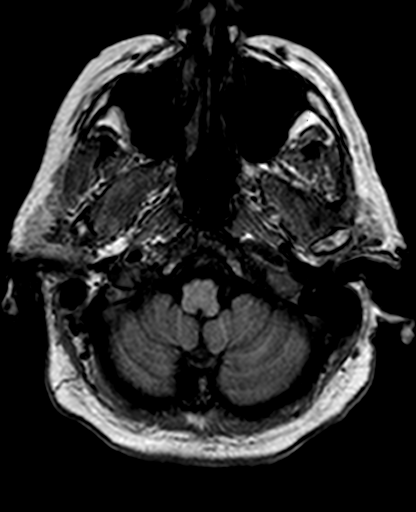
[im 19/75]
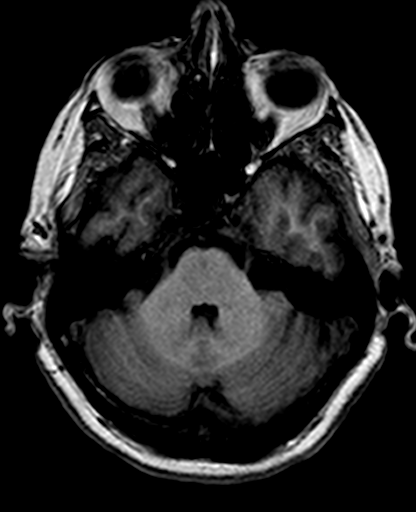
[im 28/75]
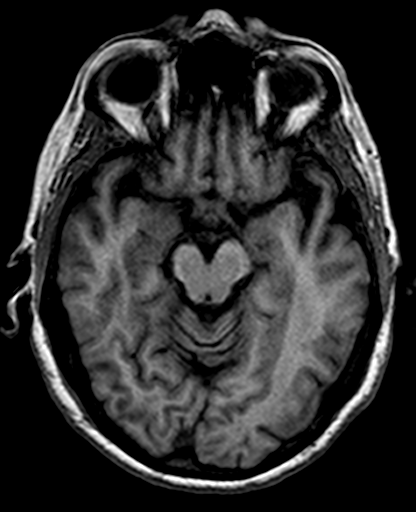
[im 47/75]
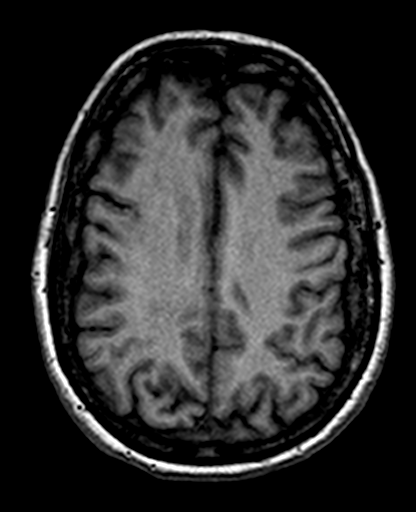
[im 56/75]
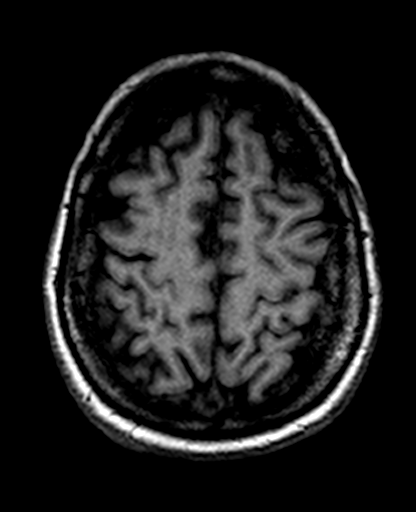
[im 65/75]
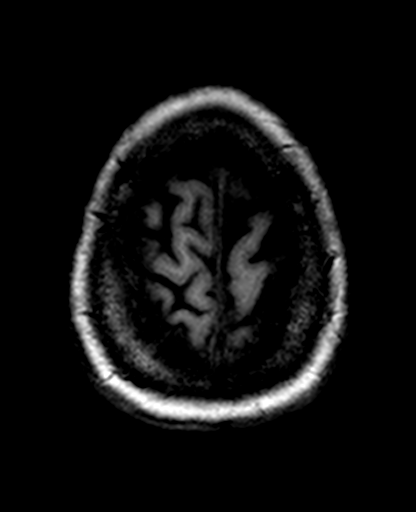

[Series 11: T2 · coronal · 5.0mm · 0.62mm/px · 3 of 28 slices shown (2 of 2)]
[im 1/28]
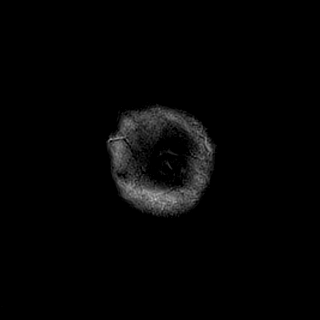
[im 14/28]
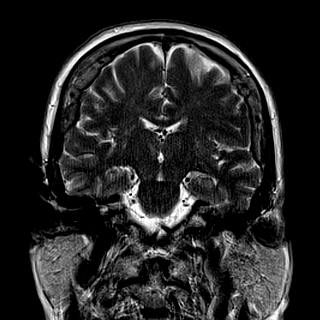
[im 28/28]
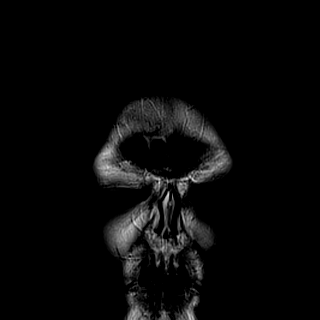

[41 of 48 positions shown; findings below may reference images not displayed]

FINDINGS: Brain: Cerebral volume is within normal limits for age.

There is a small 10 mm area of restricted diffusion in the posterior
left parietal cortex near midline on series 3, image 92. Mild T2 and
FLAIR hyperintensity with no hemorrhage or mass effect.

No other restricted diffusion. There is patchy bilateral cerebral
white matter T2 and FLAIR hyperintensity, but more so in the right
hemisphere. No chronic cortical encephalomalacia identified.
Questionable small chronic microhemorrhage in the inferior left
basal ganglia. Incidental perivascular space in the inferior right
basal ganglia. Other bilateral basal ganglia T2 heterogeneity may
represent a combination of perivascular spaces and small chronic
lacunar infarcts. Negative brainstem and cerebellum.

No midline shift, mass effect, evidence of mass lesion,
ventriculomegaly, extra-axial collection or acute intracranial
hemorrhage. Cervicomedullary junction and pituitary are within
normal limits.

Vascular: Major intracranial vascular flow voids are preserved.

Skull and upper cervical spine: Negative. Visualized bone marrow
signal is within normal limits.

Sinuses/Orbits: Normal orbits soft tissues aside from postoperative
changes to both globes. Visualized paranasal sinuses and mastoids
are stable and well pneumatized.

Other: Visible internal auditory structures appear normal. Negative
scalp and orbits soft tissues.
IMPRESSION: 1. Small acute cortical infarct in the posterior left MCA or left
MCA/ACA watershed area. No hemorrhage or mass effect.
2. No other acute intracranial abnormality. Relatively mild for age
signal changes elsewhere suggestive of chronic small vessel disease.

## 2018-12-29 ENCOUNTER — Telehealth (INDEPENDENT_AMBULATORY_CARE_PROVIDER_SITE_OTHER): Payer: Medicare HMO | Admitting: Family Medicine

## 2018-12-29 ENCOUNTER — Other Ambulatory Visit: Payer: Self-pay

## 2018-12-29 DIAGNOSIS — I480 Paroxysmal atrial fibrillation: Secondary | ICD-10-CM

## 2018-12-29 DIAGNOSIS — E7800 Pure hypercholesterolemia, unspecified: Secondary | ICD-10-CM | POA: Diagnosis not present

## 2018-12-29 DIAGNOSIS — E559 Vitamin D deficiency, unspecified: Secondary | ICD-10-CM | POA: Diagnosis not present

## 2018-12-29 DIAGNOSIS — I10 Essential (primary) hypertension: Secondary | ICD-10-CM

## 2018-12-29 DIAGNOSIS — M1711 Unilateral primary osteoarthritis, right knee: Secondary | ICD-10-CM | POA: Diagnosis not present

## 2018-12-29 NOTE — Progress Notes (Signed)
Virtual Visit via telephone Note  I connected with Nancy Mccormick on 12/29/18 at 9:50am by telephone and verified that I am speaking with the correct person using two identifiers. Nancy Mccormick is currently located at home and no one is currently with her during visit. The provider, Redge Gainer, MD is located in their office at time of visit.  I discussed the limitations, risks, security and privacy concerns of performing an evaluation and management service by telephone and the availability of in person appointments. I also discussed with the patient that there may be a patient responsible charge related to this service. The patient expressed understanding and agreed to proceed.   History and Present Illness:  The patient is sounds good and is basically feeling well other than her ongoing problems with her left knee and she is seeing the orthopedic surgeon about this and is questioning when she should go back and get injections and I told her that she should call and confirm with their office that it is proper for her to come back with the coronavirus issues that are going around currently.  She says she will do that and she will wear a mask and use hand sanitizer's.     History obtained from the patient General ROS: negative for - fatigue, fever or malaise ENT ROS: negative for - headaches, nasal discharge or sinus pain Allergy and Immunology ROS: negative for - itchy/watery eyes, nasal congestion or seasonal allergies Respiratory ROS: negative Cardiovascular ROS: negative Gastrointestinal ROS: negative Genito-Urinary ROS: negative for - dysuria Neurological ROS: negative   Observations/Objective: Patient was negative with her review of systems and having no problems currently.  She does follow-up with a cardiologist regularly and will see him again in 6 to 7 months.  She denied any chest pain pressure or tightness.  She had no shortness of breath.  She is still living at home and  plans to move to [place] and 2 to 3 months.  As mentioned above her biggest issue was her left knee pain as she has had recent knee surgery and arthroplasty in December and is following regularly with the orthopedist for this.  Assessment and Plan: Hypertension and patient will continue with current treatment Hyperlipidemia patient will continue with current treatment and we will get lab work at her next visit No problems with her heart and atrial fibrillation and she will stay on her Eliquis. Vitamin D deficiency continue with current vitamin D replacement Follow-up with arthritis and right knee not left knee.  She will follow-up with orthopedist for this.  Follow Up Instructions:     I discussed the assessment and treatment plan with the patient. The patient was provided an opportunity to ask questions and all were answered. The patient agreed with the plan and demonstrated an understanding of the instructions.   The patient was advised to call back or seek an in-person evaluation if the symptoms worsen or if the condition fails to improve as anticipated.  The above assessment and management plan was discussed with the patient. The patient verbalized understanding of and has agreed to the management plan. Patient is aware to call the clinic if symptoms persist or worsen. Patient is aware when to return to the clinic for a follow-up visit. Patient educated on when it is appropriate to go to the emergency department.   Continue to practice good hand and pulmonary hygiene Follow-up with orthopedist as planned Follow-up with cardiologist as planned Arrange visit to see [place] again in about  4 months  I provided 20 minutes minutes of non-face-to-face time during this encounter.    Redge Gainer, MD

## 2019-01-07 DIAGNOSIS — M25562 Pain in left knee: Secondary | ICD-10-CM | POA: Insufficient documentation

## 2019-01-11 DIAGNOSIS — M25561 Pain in right knee: Secondary | ICD-10-CM | POA: Diagnosis not present

## 2019-01-11 DIAGNOSIS — M25562 Pain in left knee: Secondary | ICD-10-CM | POA: Diagnosis not present

## 2019-01-11 DIAGNOSIS — M170 Bilateral primary osteoarthritis of knee: Secondary | ICD-10-CM | POA: Diagnosis not present

## 2019-01-18 DIAGNOSIS — M170 Bilateral primary osteoarthritis of knee: Secondary | ICD-10-CM | POA: Diagnosis not present

## 2019-01-25 DIAGNOSIS — M25562 Pain in left knee: Secondary | ICD-10-CM | POA: Diagnosis not present

## 2019-01-25 DIAGNOSIS — M170 Bilateral primary osteoarthritis of knee: Secondary | ICD-10-CM | POA: Diagnosis not present

## 2019-01-25 DIAGNOSIS — M1710 Unilateral primary osteoarthritis, unspecified knee: Secondary | ICD-10-CM | POA: Insufficient documentation

## 2019-01-25 DIAGNOSIS — M179 Osteoarthritis of knee, unspecified: Secondary | ICD-10-CM | POA: Insufficient documentation

## 2019-01-28 DIAGNOSIS — M25562 Pain in left knee: Secondary | ICD-10-CM | POA: Diagnosis not present

## 2019-02-01 DIAGNOSIS — M25562 Pain in left knee: Secondary | ICD-10-CM | POA: Diagnosis not present

## 2019-02-01 DIAGNOSIS — S83242D Other tear of medial meniscus, current injury, left knee, subsequent encounter: Secondary | ICD-10-CM | POA: Diagnosis not present

## 2019-03-03 ENCOUNTER — Other Ambulatory Visit: Payer: Self-pay | Admitting: Family Medicine

## 2019-03-28 ENCOUNTER — Other Ambulatory Visit: Payer: Medicare HMO

## 2019-03-28 ENCOUNTER — Other Ambulatory Visit: Payer: Self-pay

## 2019-03-28 DIAGNOSIS — E7800 Pure hypercholesterolemia, unspecified: Secondary | ICD-10-CM

## 2019-03-28 DIAGNOSIS — E559 Vitamin D deficiency, unspecified: Secondary | ICD-10-CM | POA: Diagnosis not present

## 2019-03-28 DIAGNOSIS — I10 Essential (primary) hypertension: Secondary | ICD-10-CM

## 2019-03-28 DIAGNOSIS — I480 Paroxysmal atrial fibrillation: Secondary | ICD-10-CM

## 2019-03-29 DIAGNOSIS — M94262 Chondromalacia, left knee: Secondary | ICD-10-CM | POA: Diagnosis not present

## 2019-03-29 DIAGNOSIS — S83232A Complex tear of medial meniscus, current injury, left knee, initial encounter: Secondary | ICD-10-CM | POA: Diagnosis not present

## 2019-03-29 DIAGNOSIS — M23322 Other meniscus derangements, posterior horn of medial meniscus, left knee: Secondary | ICD-10-CM | POA: Diagnosis not present

## 2019-03-29 DIAGNOSIS — S83222A Peripheral tear of medial meniscus, current injury, left knee, initial encounter: Secondary | ICD-10-CM | POA: Diagnosis not present

## 2019-03-29 LAB — CBC WITH DIFFERENTIAL/PLATELET
Basophils Absolute: 0.1 10*3/uL (ref 0.0–0.2)
Basos: 1 %
EOS (ABSOLUTE): 0.2 10*3/uL (ref 0.0–0.4)
Eos: 3 %
Hematocrit: 40.3 % (ref 34.0–46.6)
Hemoglobin: 13.3 g/dL (ref 11.1–15.9)
Immature Grans (Abs): 0 10*3/uL (ref 0.0–0.1)
Immature Granulocytes: 0 %
Lymphocytes Absolute: 2.5 10*3/uL (ref 0.7–3.1)
Lymphs: 28 %
MCH: 28.8 pg (ref 26.6–33.0)
MCHC: 33 g/dL (ref 31.5–35.7)
MCV: 87 fL (ref 79–97)
Monocytes Absolute: 0.8 10*3/uL (ref 0.1–0.9)
Monocytes: 8 %
Neutrophils Absolute: 5.5 10*3/uL (ref 1.4–7.0)
Neutrophils: 60 %
Platelets: 343 10*3/uL (ref 150–450)
RBC: 4.62 x10E6/uL (ref 3.77–5.28)
RDW: 13.3 % (ref 11.7–15.4)
WBC: 9.1 10*3/uL (ref 3.4–10.8)

## 2019-03-29 LAB — BMP8+EGFR
BUN/Creatinine Ratio: 19 (ref 12–28)
BUN: 13 mg/dL (ref 8–27)
CO2: 21 mmol/L (ref 20–29)
Calcium: 8.9 mg/dL (ref 8.7–10.3)
Chloride: 107 mmol/L — ABNORMAL HIGH (ref 96–106)
Creatinine, Ser: 0.67 mg/dL (ref 0.57–1.00)
GFR calc Af Amer: 95 mL/min/{1.73_m2} (ref >59)
GFR calc non Af Amer: 82 mL/min/{1.73_m2} (ref >59)
Glucose: 114 mg/dL — ABNORMAL HIGH (ref 65–99)
Potassium: 4.2 mmol/L (ref 3.5–5.2)
Sodium: 144 mmol/L (ref 134–144)

## 2019-03-29 LAB — LIPID PANEL
Chol/HDL Ratio: 2.6 ratio (ref 0.0–4.4)
Cholesterol, Total: 203 mg/dL — ABNORMAL HIGH (ref 100–199)
HDL: 78 mg/dL (ref >39)
LDL Calculated: 93 mg/dL (ref 0–99)
Triglycerides: 158 mg/dL — ABNORMAL HIGH (ref 0–149)
VLDL Cholesterol Cal: 32 mg/dL (ref 5–40)

## 2019-03-29 LAB — HEPATIC FUNCTION PANEL
ALT: 19 IU/L (ref 0–32)
AST: 18 IU/L (ref 0–40)
Albumin: 4.3 g/dL (ref 3.6–4.6)
Alkaline Phosphatase: 94 IU/L (ref 39–117)
Bilirubin Total: 0.4 mg/dL (ref 0.0–1.2)
Bilirubin, Direct: 0.1 mg/dL (ref 0.00–0.40)
Total Protein: 6.3 g/dL (ref 6.0–8.5)

## 2019-03-29 LAB — VITAMIN D 25 HYDROXY (VIT D DEFICIENCY, FRACTURES): Vit D, 25-Hydroxy: 43.7 ng/mL (ref 30.0–100.0)

## 2019-04-21 ENCOUNTER — Other Ambulatory Visit: Payer: Self-pay | Admitting: *Deleted

## 2019-04-21 MED ORDER — OMEGA-3-ACID ETHYL ESTERS 1 G PO CAPS: 1.0000 | ORAL_CAPSULE | Freq: Two times a day (BID) | ORAL | 0 refills | Status: DC

## 2019-04-21 NOTE — Telephone Encounter (Signed)
OV 05/24/19 [place]

## 2019-05-05 DIAGNOSIS — H5213 Myopia, bilateral: Secondary | ICD-10-CM | POA: Diagnosis not present

## 2019-05-11 ENCOUNTER — Ambulatory Visit: Payer: Medicare HMO | Admitting: Family Medicine

## 2019-05-24 ENCOUNTER — Ambulatory Visit: Payer: Medicare HMO | Admitting: Family Medicine

## 2019-06-04 ENCOUNTER — Other Ambulatory Visit: Payer: Self-pay | Admitting: Family Medicine

## 2019-06-11 ENCOUNTER — Other Ambulatory Visit: Payer: Self-pay | Admitting: Family Medicine

## 2019-06-14 ENCOUNTER — Telehealth: Payer: Self-pay

## 2019-06-14 NOTE — Telephone Encounter (Signed)
Copied from [place] [id number]. Topic: Appointment Scheduling - New Patient >> Jun 14, 2019  1:55 PM Leward Quan A wrote: New patient has been scheduled for your office. Provider: Rogers Blocker Date of Appointment: 07/11/2019  Route to department's PEC pool.

## 2019-06-24 ENCOUNTER — Other Ambulatory Visit: Payer: Self-pay | Admitting: Family Medicine

## 2019-06-27 ENCOUNTER — Telehealth: Payer: Self-pay | Admitting: Cardiology

## 2019-06-27 NOTE — Telephone Encounter (Signed)
Spoke with patient who wished to r/s her 06/30/19 appt to an in-office visit - r/s to 08/11/2019

## 2019-06-27 NOTE — Telephone Encounter (Signed)
New Message   Patient is calling in reference to a voicemail that she received about a change needing to be made to her appointment, states that she couldn't really hear the voicemail, but it sounded like the name of the person that called was Arbie Cookey. I don't see any notes regarding any changes. Please give patient a call back.

## 2019-06-30 ENCOUNTER — Ambulatory Visit: Payer: Medicare HMO | Admitting: Cardiology

## 2019-07-04 ENCOUNTER — Other Ambulatory Visit: Payer: Self-pay | Admitting: Family Medicine

## 2019-07-11 ENCOUNTER — Encounter: Payer: Self-pay | Admitting: Family Medicine

## 2019-07-11 ENCOUNTER — Ambulatory Visit: Payer: Medicare HMO | Admitting: Family Medicine

## 2019-07-11 ENCOUNTER — Other Ambulatory Visit: Payer: Self-pay

## 2019-07-11 VITALS — BP 138/82 | HR 78 | Temp 97.9°F | Ht 64.0 in | Wt 184.0 lb

## 2019-07-11 DIAGNOSIS — E785 Hyperlipidemia, unspecified: Secondary | ICD-10-CM

## 2019-07-11 DIAGNOSIS — L821 Other seborrheic keratosis: Secondary | ICD-10-CM | POA: Diagnosis not present

## 2019-07-11 DIAGNOSIS — M7989 Other specified soft tissue disorders: Secondary | ICD-10-CM | POA: Diagnosis not present

## 2019-07-11 DIAGNOSIS — Z1231 Encounter for screening mammogram for malignant neoplasm of breast: Secondary | ICD-10-CM | POA: Diagnosis not present

## 2019-07-11 DIAGNOSIS — I10 Essential (primary) hypertension: Secondary | ICD-10-CM | POA: Diagnosis not present

## 2019-07-11 LAB — CBC WITH DIFFERENTIAL/PLATELET
Basophils Absolute: 0.1 10*3/uL (ref 0.0–0.1)
Basophils Relative: 0.7 % (ref 0.0–3.0)
Eosinophils Absolute: 0.2 10*3/uL (ref 0.0–0.7)
Eosinophils Relative: 2.7 % (ref 0.0–5.0)
HCT: 41.7 % (ref 36.0–46.0)
Hemoglobin: 13.5 g/dL (ref 12.0–15.0)
Lymphocytes Relative: 23.2 % (ref 12.0–46.0)
Lymphs Abs: 2.1 10*3/uL (ref 0.7–4.0)
MCHC: 32.4 g/dL (ref 30.0–36.0)
MCV: 90.9 fl (ref 78.0–100.0)
Monocytes Absolute: 0.7 10*3/uL (ref 0.1–1.0)
Monocytes Relative: 7.8 % (ref 3.0–12.0)
Neutro Abs: 5.8 10*3/uL (ref 1.4–7.7)
Neutrophils Relative %: 65.6 % (ref 43.0–77.0)
Platelets: 395 10*3/uL (ref 150.0–400.0)
RBC: 4.59 Mil/uL (ref 3.87–5.11)
RDW: 14.4 % (ref 11.5–15.5)
WBC: 8.9 10*3/uL (ref 4.0–10.5)

## 2019-07-11 LAB — LIPID PANEL
Cholesterol: 197 mg/dL (ref 0–200)
HDL: 69.7 mg/dL (ref >39.00)
LDL Cholesterol: 97 mg/dL (ref 0–99)
NonHDL: 127.08
Total CHOL/HDL Ratio: 3
Triglycerides: 148 mg/dL (ref 0.0–149.0)
VLDL: 29.6 mg/dL (ref 0.0–40.0)

## 2019-07-11 LAB — COMPREHENSIVE METABOLIC PANEL
ALT: 12 U/L (ref 0–35)
AST: 10 U/L (ref 0–37)
Albumin: 4.2 g/dL (ref 3.5–5.2)
Alkaline Phosphatase: 92 U/L (ref 39–117)
BUN: 15 mg/dL (ref 6–23)
CO2: 30 mEq/L (ref 19–32)
Calcium: 9.5 mg/dL (ref 8.4–10.5)
Chloride: 106 mEq/L (ref 96–112)
Creatinine, Ser: 0.61 mg/dL (ref 0.40–1.20)
GFR: 93.67 mL/min (ref >60.00)
Glucose, Bld: 97 mg/dL (ref 70–99)
Potassium: 4.1 mEq/L (ref 3.5–5.1)
Sodium: 145 mEq/L (ref 135–145)
Total Bilirubin: 0.6 mg/dL (ref 0.2–1.2)
Total Protein: 6.6 g/dL (ref 6.0–8.3)

## 2019-07-11 LAB — MICROALBUMIN / CREATININE URINE RATIO
Creatinine,U: 161.2 mg/dL
Microalb Creat Ratio: 1.1 mg/g (ref 0.0–30.0)
Microalb, Ur: 1.8 mg/dL (ref 0.0–1.9)

## 2019-07-11 LAB — BRAIN NATRIURETIC PEPTIDE: Pro B Natriuretic peptide (BNP): 24 pg/mL (ref 0.0–100.0)

## 2019-07-11 MED ORDER — APIXABAN 5 MG PO TABS: 5.0000 mg | ORAL_TABLET | Freq: Two times a day (BID) | ORAL | 1 refills | Status: DC

## 2019-07-11 MED ORDER — DILTIAZEM HCL ER COATED BEADS 240 MG PO CP24: 240.0000 mg | ORAL_CAPSULE | Freq: Every day | ORAL | 3 refills | Status: DC

## 2019-07-11 NOTE — Patient Instructions (Signed)
-  increase your crestor to 4 days/week. If any muscle cramping drop back down to 3 days/week.   -if area on bottom doesn't get better PLEASE call me so we can send to derm. Looks like regular wart vs. Seborrheic keratosis.   So nice to meet you!!    Dr. Rogers Blocker

## 2019-07-11 NOTE — Progress Notes (Signed)
Patient: Nancy Mccormick MRN: [id number] DOB: [birth year] PCP: Orma Flaming, MD     Subjective:  Chief Complaint  Patient presents with  . Establish Care  . swelling in L ankle  . med refills    HPI: The patient is a 85 y.o. female who presents today for establishing care. She has past medical history of CVA, afib with RVR, HTN, hyperlipidemia and OA.   Hypertension: Here for follow up of hypertension.  Currently on cardizem 240mg . Takes medication as prescribed and denies any side effects. Exercise includes classes at her assisted living and line dancing class. Weight has been stable. Denies any chest pain, headaches, shortness of breath, vision changes. She does have complaints of swelling in her left lower extremity.   Left lower leg swelling: Started after she had surgery to her left knee. Last surgery was in may/june of this year. No orthopnea, cough, dyspnea. No increased exertion with walking. She has no problems walking her normal distance. No hx of liver/kidney issues. No weight gain and denies fast heart rate or irregular beat.   afib with RVR: currently on eliquis and cardizem. NSR. Needs refill of her eliquis.  She has an appointment with cardiologist next week (Dr. Gregary Signs).   Dyslipidemia: currently on crestor 15mg /week. Was advised to increase to 20mg /week, but she has not done this and doesn't remember getting this message from her previous pcp.   Skin lesion on right buttocks: She thinks it has been there for several months. hasn't grown. Sometimes is itchy. No bleeding/pain. Had one in similar place or other buttocks and it was frozen off.   Review of Systems  Constitutional: Negative for chills, fatigue and fever.  HENT: Negative for congestion, dental problem, ear pain, hearing loss, postnasal drip, rhinorrhea, sore throat and trouble swallowing.        Pt has seasonal allergies  Respiratory: Negative for cough, chest tightness and shortness of breath.    Cardiovascular: Positive for leg swelling. Negative for chest pain and palpitations.  Gastrointestinal: Negative for abdominal pain, blood in stool, diarrhea, nausea and vomiting.  Endocrine: Negative for cold intolerance, polydipsia, polyphagia and polyuria.  Genitourinary: Positive for frequency. Negative for dysuria, hematuria and urgency.  Musculoskeletal: Negative for arthralgias.  Skin: Positive for color change. Negative for rash.       C/o several skin tags in various areas.  Neurological: Negative for dizziness and headaches.  Psychiatric/Behavioral: Negative for dysphoric mood and sleep disturbance. The patient is not nervous/anxious.     Allergies Patient is allergic to shellfish allergy; atorvastatin; and livalo [pitavastatin].  Past Medical History Patient  has a past medical history of Arthritis, Atrial fibrillation ([place]), Hyperlipidemia, Hypertension, and Osteoporosis.  Surgical History Patient  has a past surgical history that includes Vaginal hysterectomy; Cataract extraction; Open reduction internal fixation (orif) distal radial fracture (Right, 12/28/2012); and Knee arthroscopy (Right, 07/04/2015).  Family History Pateint's family history includes Anemia in her mother; Heart disease in her mother; Hypertension in her son; Stroke (age of onset: 52) in her father; Stroke (age of onset: 65) in her sister; Stroke (age of onset: 30) in her mother.  Social History Patient  reports that she quit smoking about 32 years ago. Her smoking use included cigarettes. She has never used smokeless tobacco. She reports that she does not drink alcohol or use drugs.    Objective: Vitals:   07/11/19 1305  BP: 138/82  Pulse: 78  Temp: 97.9 F (36.6 C)  TempSrc: Skin  SpO2: 94%  Weight:  184 lb (83.5 kg)  Height: 5\' 4"  (1.626 m)    Body mass index is 31.58 kg/m.  Physical Exam Vitals signs reviewed.  Constitutional:      Appearance: Normal appearance. She is well-developed.  She is obese.  HENT:     Head: Normocephalic and atraumatic.     Right Ear: Tympanic membrane, ear canal and external ear normal.     Left Ear: Tympanic membrane, ear canal and external ear normal.     Nose: Nose normal.     Mouth/Throat:     Mouth: Mucous membranes are moist.  Eyes:     Extraocular Movements: Extraocular movements intact.     Conjunctiva/sclera: Conjunctivae normal.     Pupils: Pupils are equal, round, and reactive to light.  Neck:     Musculoskeletal: Normal range of motion and neck supple.     Thyroid: No thyromegaly.  Cardiovascular:     Rate and Rhythm: Normal rate and regular rhythm.     Pulses: Normal pulses.     Heart sounds: Normal heart sounds. No murmur.     Comments: Minimal to mild LE edema in left leg. Non pitting to above ankle.  Pulmonary:     Effort: Pulmonary effort is normal.     Breath sounds: Normal breath sounds.  Abdominal:     General: Abdomen is flat. Bowel sounds are normal. There is no distension.     Palpations: Abdomen is soft.     Tenderness: There is no abdominal tenderness.  Lymphadenopathy:     Cervical: No cervical adenopathy.  Skin:    General: Skin is warm and dry.     Findings: No rash.     Comments: Right buttocks: raised, stuck on appearing flesh colored lesion that has 3 parts that are coalesced together.   Neurological:     General: No focal deficit present.     Mental Status: She is alert and oriented to person, place, and time.     Cranial Nerves: No cranial nerve deficit.     Coordination: Coordination normal.     Deep Tendon Reflexes: Reflexes normal.  Psychiatric:        Mood and Affect: Mood normal.        Behavior: Behavior normal.    Depression screen [facility] 2/9 07/11/2019 08/26/2018 08/18/2018 07/14/2018 06/24/2018  Decreased Interest 0 0 0 0 0  Down, Depressed, Hopeless 0 0 0 0 0  PHQ - 2 Score 0 0 0 0 0  Altered sleeping - - - - -  Tired, decreased energy - - - - -  Change in appetite - - - - -  Feeling  bad or failure about yourself  - - - - -  Trouble concentrating - - - - -  Moving slowly or fidgety/restless - - - - -  Suicidal thoughts - - - - -  PHQ-9 Score - - - - -   Cryotherapy done to lesion on right buttocks. Verbal consent obtained. 2 light freeze cycles done to lesion. patient tolerated well. Wound care information given.      Assessment/plan: 1. Essential hypertension Blood pressure is to goal. Continue current anti-hypertensive medications. Refills given and routine lab work will be done today. Recommended routine exercise and healthy diet including DASH diet and mediterranean diet. Encouraged weight loss. F/u in 6 months.   - CBC with Differential/Platelet - Comprehensive metabolic panel - Microalbumin / creatinine urine ratio  2. Dyslipidemia (high LDL; low HDL) Was supposed to increase  her crestor to 4pills/week. She never did this. Goal LDL is less than 70. Will check today and see if she can tolerate the increase in statin. If not, drop back down to 15mg /week. Continue diet/exercise.  - Lipid panel  3. Leg swelling Very minimal and started after surgery. Discussed can see after procedures due to issue from surgery. Will check labs to rule out other etiology and discussed possibility of venous insufficiency. Will have her do compression hose, leg elevation. Has f/u with cardiology next week. Let me know if getting worse.  - Brain natriuretic peptide  4. Seborrheic keratosis cryotherapy today. Discussed SK vs. Wart. If not better with cryo she needs to let me know so we can send to derm vs. Re eval.   5. Breast cancer screening by mammogram  - MM Digital Screening; Future    Return in about 6 months (around 01/09/2020) for regular htn f.u. .   Orma Flaming, MD [place]    07/11/2019

## 2019-07-25 ENCOUNTER — Telehealth: Payer: Self-pay | Admitting: Family Medicine

## 2019-07-25 NOTE — Telephone Encounter (Signed)
We schedule to do removal in the next few weeks. 55min. Procedure please.  Orma Flaming, MD [place]

## 2019-07-25 NOTE — Telephone Encounter (Signed)
See note

## 2019-07-25 NOTE — Telephone Encounter (Signed)
Patient called wanting PCP to know the spot pcp had frozen off has not gone away.  Patient states she was suppose to let PCP know if did not go away in 2 weeks, Call back [phone]

## 2019-07-25 NOTE — Telephone Encounter (Signed)
Called and left a voicemail to get the patient scheduled. No need to route the note, for documentation purposes only.

## 2019-07-28 ENCOUNTER — Other Ambulatory Visit: Payer: Self-pay | Admitting: Family Medicine

## 2019-07-28 DIAGNOSIS — Z1231 Encounter for screening mammogram for malignant neoplasm of breast: Secondary | ICD-10-CM

## 2019-08-03 ENCOUNTER — Other Ambulatory Visit: Payer: Self-pay | Admitting: Nurse Practitioner

## 2019-08-05 ENCOUNTER — Other Ambulatory Visit: Payer: Self-pay

## 2019-08-05 ENCOUNTER — Ambulatory Visit: Payer: Medicare HMO | Admitting: Family Medicine

## 2019-08-05 ENCOUNTER — Other Ambulatory Visit: Payer: Self-pay | Admitting: Family Medicine

## 2019-08-05 ENCOUNTER — Encounter: Payer: Self-pay | Admitting: Family Medicine

## 2019-08-05 VITALS — BP 122/78 | HR 63 | Temp 97.7°F | Ht 64.0 in | Wt 184.0 lb

## 2019-08-05 DIAGNOSIS — L989 Disorder of the skin and subcutaneous tissue, unspecified: Secondary | ICD-10-CM | POA: Diagnosis not present

## 2019-08-05 DIAGNOSIS — D493 Neoplasm of unspecified behavior of breast: Secondary | ICD-10-CM | POA: Diagnosis not present

## 2019-08-05 NOTE — Progress Notes (Signed)
Patient: Nancy Mccormick MRN: [id number] DOB: [birth year] PCP: Orma Flaming, MD     Subjective:  Chief Complaint  Patient presents with  . Procedure    HPI: The patient is a 85 y.o. female who presents today for shave excision of non healing lesion on her right gluteus. She first noticed it 3 months ago. We have done 1 cycle of cryotherapy with some success. She denies any bleeding and has not grown at all. She denies any pain/ictching. No history of skin cancer.   Review of Systems  Constitutional: Negative for fatigue and fever.  HENT: Negative for congestion.   Eyes: Negative for visual disturbance.  Respiratory: Negative for cough and shortness of breath.   Cardiovascular: Negative for chest pain, palpitations and leg swelling.  Gastrointestinal: Negative for abdominal pain, diarrhea and nausea.  Musculoskeletal: Negative for arthralgias.  Skin: Positive for wound (skin lesion of right gluteus ).    Allergies Patient is allergic to shellfish allergy; atorvastatin; and livalo [pitavastatin].  Past Medical History Patient  has a past medical history of Arthritis, Atrial fibrillation ([place]), History of chicken pox, Hyperlipidemia, Hypertension, Osteoporosis, and Stroke ([place]).  Surgical History Patient  has a past surgical history that includes Vaginal hysterectomy; Cataract extraction; Open reduction internal fixation (orif) distal radial fracture (Right, 12/28/2012); and Knee arthroscopy (Right, 07/04/2015).  Family History Pateint's family history includes Anemia in her mother; Diabetes in her sister; Early death in her father; Hearing loss in her mother; Heart attack in her mother; Heart disease in her mother; Hypertension in her father, mother, sister, and son; Stroke (age of onset: 75) in her father; Stroke (age of onset: 82) in her sister; Stroke (age of onset: 87) in her mother.  Social History Patient  reports that she quit smoking about 32 years ago. Her smoking use  included cigarettes. She has never used smokeless tobacco. She reports that she does not drink alcohol or use drugs.    Objective: Vitals:   08/05/19 1105  BP: 122/78  Pulse: 63  Temp: 97.7 F (36.5 C)  TempSrc: Temporal  SpO2: 97%  Weight: 184 lb (83.5 kg)  Height: 5\' 4"  (1.626 m)    Body mass index is 31.58 kg/m.  Physical Exam Vitals signs reviewed.  Constitutional:      Appearance: Normal appearance.  Skin:    Findings: Lesion (right gluteus. flesh colored with mild erythema. irregular border with some scaling. about 1.5cm in diameter ) present.  Neurological:     Mental Status: She is alert.    Procedure note: verbal consent obtained. Risks discussed. Time out done with name/dob and site verified. Area cleaned with alcohol. 81ml of lidocaine used for local anesthetic. Shave excision done of part of lesion. Minimal bleeding. drysol used for hemostasis. Antibiotic ointment and band aid applied. Tolerated well and specimen will be sent to pathology.     Assessment/plan: 1. Skin lesion Likely SK, but rule out SCC/BCC. Sending to pathology. Wound care instructions given. Will f/u with results.  - Dermatology pathology([place])   Return if symptoms worsen or fail to improve.     Orma Flaming, MD [place]  08/05/2019

## 2019-08-05 NOTE — Patient Instructions (Signed)
Skin Biopsy, Care After This sheet gives you information about how to care for yourself after your procedure. Your health care provider may also give you more specific instructions. If you have problems or questions, contact your health care provider. What can I expect after the procedure? After the procedure, it is common to have:  Soreness.  Bruising.  Itching. Follow these instructions at home: Biopsy site care Follow instructions from your health care provider about how to take care of your biopsy site. Make sure you:  Wash your hands with soap and water before and after you change your bandage (dressing). If soap and water are not available, use hand sanitizer.  Apply ointment on your biopsy site as directed by your health care provider.  Change your dressing as told by your health care provider.  Leave stitches (sutures), skin glue, or adhesive strips in place. These skin closures may need to stay in place for 2 weeks or longer. If adhesive strip edges start to loosen and curl up, you may trim the loose edges. Do not remove adhesive strips completely unless your health care provider tells you to do that.  If the biopsy area bleeds, apply gentle pressure for 10 minutes. Check your biopsy site every day for signs of infection. Check for:  Redness, swelling, or pain.  Fluid or blood.  Warmth.  Pus or a bad smell.  General instructions  Rest and then return to your normal activities as told by your health care provider.  Take over-the-counter and prescription medicines only as told by your health care provider.  Keep all follow-up visits as told by your health care provider. This is important. Contact a health care provider if:  You have redness, swelling, or pain around your biopsy site.  You have fluid or blood coming from your biopsy site.  Your biopsy site feels warm to the touch.  You have pus or a bad smell coming from your biopsy site.  You have a fever.   Your sutures, skin glue, or adhesive strips loosen or come off sooner than expected. Get help right away if:  You have bleeding that does not stop with pressure or a dressing. Summary  After the procedure, it is common to have soreness, bruising, and itching at the site.  Follow instructions from your health care provider about how to take care of your biopsy site.  Check your biopsy site every day for signs of infection.  Contact a health care provider if you have redness, swelling, or pain around your biopsy site, or your biopsy site feels warm to the touch.  Keep all follow-up visits as told by your health care provider. This is important. This information is not intended to replace advice given to you by your health care provider. Make sure you discuss any questions you have with your health care provider. Document Released: 10/19/2015 Document Revised: 03/22/2018 Document Reviewed: 03/22/2018 Elsevier Patient Education  2020 [employer]  

## 2019-08-08 NOTE — Telephone Encounter (Signed)
Copied from [place] [id number]. Topic: General - Inquiry >> Aug 08, 2019  8:59 AM Richardo Priest, NT wrote: Reason for CRM: Missy from [facility] called in and would like to verify pt's biopsy site. Please call back at [phone] and ask for Missy.

## 2019-08-08 NOTE — Telephone Encounter (Signed)
Called missy and verified site was the right gluteus.  Orma Flaming, MD [place]

## 2019-08-10 NOTE — Progress Notes (Signed)
HPI The patient presents for followup of atrial fibrillation she was admitted in Nov 2018 with a small MCA infarct and was in atrial fib.  She was in the hospital in August of last year she had atrial fib with RVR which converted spontaneously to NSR.    Since I last saw her she has done well.  She has had some leg swelling.  This is been mild.  She is not having any shortness of breath, PND or orthopnea.  Is not been related to salt and fluid as she watches these.  She is not noticing any palpitations similar to previous atrial fibrillation.  She is had no chest pressure, neck or arm discomfort.  She line dances and does other exercises.    Allergies  Allergen Reactions  . Shellfish Allergy Itching, Swelling and Rash  . Atorvastatin Other (See Comments)    myalgias   . Livalo [Pitavastatin] Other (See Comments)    myalgias    Current Outpatient Medications  Medication Sig Dispense Refill  . acetaminophen (TYLENOL) 500 MG tablet Take 1,000 mg by mouth every 6 (six) hours as needed for mild pain or moderate pain.    Marland Kitchen apixaban (ELIQUIS) 5 MG TABS tablet Take 1 tablet (5 mg total) by mouth 2 (two) times daily. 180 tablet 1  . Calcium Carbonate-Vitamin D (CALCIUM + D PO) Take 1 tablet by mouth at bedtime.    Marland Kitchen diltiazem (CARDIZEM CD) 240 MG 24 hr capsule Take 1 capsule (240 mg total) by mouth daily. 90 capsule 3  . Multiple Vitamin (MULTIVITAMIN WITH MINERALS) TABS tablet Take 1 tablet by mouth at bedtime.    . nitroGLYCERIN (NITROSTAT) 0.4 MG SL tablet Place 1 tablet (0.4 mg total) under the tongue as needed. 25 tablet 11  . omega-3 acid ethyl esters (LOVAZA) 1 g capsule TAKE 1 CAPSULE (1 G TOTAL) BY MOUTH 2 (TWO) TIMES DAILY. 180 capsule 1  . Psyllium (NAT-RUL PSYLLIUM SEED HUSKS) 500 MG CAPS Take 2 capsules by mouth daily.    . rosuvastatin (CRESTOR) 5 MG tablet Take 3 tablets (15 mg total) by mouth 3 (three) times a week. 108 tablet 3   No current facility-administered  medications for this visit.     Past Medical History:  Diagnosis Date  . Arthritis    back, left shoulder, and fingers  . Atrial fibrillation ([place])   . History of chicken pox   . Hyperlipidemia   . Hypertension   . Osteoporosis   . Stroke [facility])     Past Surgical History:  Procedure Laterality Date  . CATARACT EXTRACTION    . KNEE ARTHROSCOPY Right 07/04/2015   Procedure: RIGHT KNEE ARTHROSCOPY WITH MEDIAL MENISCAL DEBRIDEMENT AND CHONDROPLASTY;  Surgeon: Gaynelle Arabian, MD;  Location: [facility];  Service: Orthopedics;  Laterality: Right;  . OPEN REDUCTION INTERNAL FIXATION (ORIF) DISTAL RADIAL FRACTURE Right 12/28/2012   Procedure: OPEN REDUCTION INTERNAL FIXATION (ORIF) DISTAL RADIAL FRACTURE;  Surgeon: Hessie Dibble, MD;  Location: [place];  Service: Orthopedics;  Laterality: Right;  Marland Kitchen VAGINAL HYSTERECTOMY      ROS:   As stated in the HPI and negative for all other systems.   PHYSICAL EXAM BP (!) 155/82   Pulse 61   Temp (!) 97.5 F (36.4 C)   Ht 5\' 4"  (1.626 m)   Wt 179 lb 12.8 oz (81.6 kg)   SpO2 95%   BMI 30.86 kg/m   GENERAL:  Well appearing NECK:  No jugular venous distention, waveform within normal limits, carotid upstroke brisk and symmetric, no bruits, no thyromegaly LUNGS:  Clear to auscultation bilaterally CHEST:  Unremarkable HEART:  PMI not displaced or sustained,S1 and S2 within normal limits, no S3, no S4, no clicks, no rubs, no murmurs ABD:  Flat, positive bowel sounds normal in frequency in pitch, no bruits, no rebound, no guarding, no midline pulsatile mass, no hepatomegaly, no splenomegaly EXT:  2 plus pulses throughout, no edema, no cyanosis no clubbing  EKG: Regular rhythm, unusual P wave axis, possible ectopic atrial rhythm, no acute ST-T wave changes.   Lab Results  Component Value Date   CHOL 197 07/11/2019   TRIG 148.0 07/11/2019   HDL 69.70 07/11/2019   Ada 97 07/11/2019     ASSESSMENT AND PLAN   ATRIAL  FIBRILLATION:     She had no symptomatic paroxysms.  She tolerates anticoagulation and Cardizem.  No change in therapy.  HTN:   The blood pressure is mildly elevated.  I like her to keep a blood pressure diary.  Over a couple of weeks if it still elevated I would plan to change her medications.   DYSLIPIDEMIA:    LDL is slightly elevated at 97 but her HDL was 70.  I think the ratio is fine and she will continue the meds as listed.

## 2019-08-11 ENCOUNTER — Other Ambulatory Visit: Payer: Self-pay | Admitting: Family Medicine

## 2019-08-11 ENCOUNTER — Encounter: Payer: Self-pay | Admitting: Cardiology

## 2019-08-11 ENCOUNTER — Other Ambulatory Visit: Payer: Self-pay

## 2019-08-11 ENCOUNTER — Ambulatory Visit: Payer: Medicare HMO | Admitting: Cardiology

## 2019-08-11 VITALS — BP 155/82 | HR 61 | Temp 97.5°F | Ht 64.0 in | Wt 179.8 lb

## 2019-08-11 DIAGNOSIS — I480 Paroxysmal atrial fibrillation: Secondary | ICD-10-CM

## 2019-08-11 DIAGNOSIS — E785 Hyperlipidemia, unspecified: Secondary | ICD-10-CM | POA: Diagnosis not present

## 2019-08-11 DIAGNOSIS — I10 Essential (primary) hypertension: Secondary | ICD-10-CM

## 2019-08-11 DIAGNOSIS — D485 Neoplasm of uncertain behavior of skin: Secondary | ICD-10-CM | POA: Insufficient documentation

## 2019-08-11 NOTE — Patient Instructions (Addendum)
Medication Instructions:  Your physician recommends that you continue on your current medications as directed. Please refer to the Current Medication list given to you today.  *If you need a refill on your cardiac medications before your next appointment, please call your pharmacy*  Lab Work: NONE ordered at this time of appointment   If you have labs (blood work) drawn today and your tests are completely normal, you will receive your results only by: Marland Kitchen MyChart Message (if you have MyChart) OR . A paper copy in the mail If you have any lab test that is abnormal or we need to change your treatment, we will call you to review the results.  Testing/Procedures: NONE ordered at this time of appointment   Follow-Up: At [facility], you and your health needs are our priority.  As part of our continuing mission to provide you with exceptional heart care, we have created designated Provider Care Teams.  These Care Teams include your primary Cardiologist (physician) and Advanced Practice Providers (APPs -  Physician Assistants and Nurse Practitioners) who all work together to provide you with the care you need, when you need it.  Your next appointment:   12 months  The format for your next appointment:   In Person  Provider:   You may see Minus Breeding, MD or one of the following Advanced Practice Providers on your designated Care Team:    Rosaria Ferries, PA-C  Jory Sims, DNP, ANP  Cadence Kathlen Mody, NP   Other Instructions  Monitor blood pressure at home for 3-4 weeks. Give our office a call if your blood pressure is over 130/80 consistently

## 2019-08-12 NOTE — Telephone Encounter (Signed)
I sent in her eliquis on 07/11/2019. Did she not receive?  Orma Flaming, MD [place]

## 2019-08-12 NOTE — Telephone Encounter (Signed)
Spoke to patient.  Rx for Eliquis was filled on 10/5 for 180 tabs.  [facility] on [address].  We r/c automatic ref[facility] for [facility] [place].  Patient no longer uses this pharmacy.

## 2019-08-12 NOTE — Telephone Encounter (Signed)
Last OV 08/05/19 Last refill 07/11/19 #180/1 Next 01/09/20

## 2019-09-15 ENCOUNTER — Other Ambulatory Visit: Payer: Self-pay

## 2019-09-15 ENCOUNTER — Ambulatory Visit
Admission: RE | Admit: 2019-09-15 | Discharge: 2019-09-15 | Disposition: A | Discharge status: Home / Self Care | Payer: Medicare HMO | Source: Ambulatory Visit | Attending: Family Medicine | Admitting: Family Medicine

## 2019-09-15 DIAGNOSIS — Z1231 Encounter for screening mammogram for malignant neoplasm of breast: Secondary | ICD-10-CM

## 2019-09-26 DIAGNOSIS — E785 Hyperlipidemia, unspecified: Secondary | ICD-10-CM | POA: Diagnosis not present

## 2019-09-26 DIAGNOSIS — E669 Obesity, unspecified: Secondary | ICD-10-CM | POA: Diagnosis not present

## 2019-09-26 DIAGNOSIS — Z823 Family history of stroke: Secondary | ICD-10-CM | POA: Diagnosis not present

## 2019-09-26 DIAGNOSIS — Z8249 Family history of ischemic heart disease and other diseases of the circulatory system: Secondary | ICD-10-CM | POA: Diagnosis not present

## 2019-09-26 DIAGNOSIS — E261 Secondary hyperaldosteronism: Secondary | ICD-10-CM | POA: Diagnosis not present

## 2019-09-26 DIAGNOSIS — I4891 Unspecified atrial fibrillation: Secondary | ICD-10-CM | POA: Diagnosis not present

## 2019-09-26 DIAGNOSIS — Z8673 Personal history of transient ischemic attack (TIA), and cerebral infarction without residual deficits: Secondary | ICD-10-CM | POA: Diagnosis not present

## 2019-09-26 DIAGNOSIS — Z7901 Long term (current) use of anticoagulants: Secondary | ICD-10-CM | POA: Diagnosis not present

## 2019-09-26 DIAGNOSIS — Z833 Family history of diabetes mellitus: Secondary | ICD-10-CM | POA: Diagnosis not present

## 2019-10-05 ENCOUNTER — Ambulatory Visit (INDEPENDENT_AMBULATORY_CARE_PROVIDER_SITE_OTHER): Payer: Medicare HMO | Admitting: Family Medicine

## 2019-10-05 ENCOUNTER — Ambulatory Visit: Payer: Medicare HMO | Admitting: Family Medicine

## 2019-10-05 ENCOUNTER — Encounter: Payer: Self-pay | Admitting: Family Medicine

## 2019-10-05 ENCOUNTER — Other Ambulatory Visit: Payer: Self-pay

## 2019-10-05 ENCOUNTER — Telehealth: Payer: Self-pay | Admitting: Family Medicine

## 2019-10-05 VITALS — Ht 64.0 in | Wt 185.0 lb

## 2019-10-05 DIAGNOSIS — B370 Candidal stomatitis: Secondary | ICD-10-CM | POA: Diagnosis not present

## 2019-10-05 DIAGNOSIS — L820 Inflamed seborrheic keratosis: Secondary | ICD-10-CM | POA: Diagnosis not present

## 2019-10-05 MED ORDER — NYSTATIN 100000 UNIT/ML MT SUSP: 5.0000 mL | Freq: Four times a day (QID) | OROMUCOSAL | 0 refills | Status: DC

## 2019-10-05 NOTE — Telephone Encounter (Signed)
Spoke with patient.  Virtual appointment scheduled for 2:40 pm today 12/30.  Patient verbalized understanding.

## 2019-10-05 NOTE — Progress Notes (Signed)
Patient: Nancy Mccormick MRN: [id number] DOB: 06-21-1934 PCP: Orma Flaming, MD     I connected with [name] on 10/05/19 at 2:30pm by a video enabled telemedicine application and verified that I am speaking with the correct person using two identifiers.  Location patient: Home Location provider: [facility], Office Persons participating in this virtual visit: Khalil Brumleve and Dr. Rogers Blocker   I discussed the limitations of evaluation and management by telemedicine and the availability of in person appointments. The patient expressed understanding and agreed to proceed.   Interactive audio and video telecommunications were attempted between this provider and patient, however failed, due to patient having technical difficulties OR patient did not have access to video capability.  We continued and completed visit with audio only.    Subjective:  Chief Complaint  Patient presents with  . Sore Throat    HPI: The patient is a 85 y.o. female who presents today for sore throat, congestion and white coating on tongue. She states her sore throat started weeks ago when the weather changed. This is normal and always happens to her. She states she had some skin rubbed off in her mouth and then started to have congestion. She has had a bad taste in her mouth and states her tongue is coated with white stuff in the AM. She will gurgle with warm salt water and this helps. The bone in the roof of her mouth is sore (x 3 weeks). She sees no other white spots in her mouth. She does not use inhalers, no dentures and no recent steroids. She has not had any antibiotics in a while.   Review of Systems  Constitutional: Negative for chills and fever.  HENT: Positive for congestion and sore throat. Negative for postnasal drip, rhinorrhea, sinus pressure, sinus pain and trouble swallowing.   Eyes: Negative for photophobia and pain.  Respiratory: Positive for cough. Negative for shortness of breath.    Cardiovascular: Negative for chest pain.  Gastrointestinal: Negative for abdominal pain, diarrhea, nausea and vomiting.  Musculoskeletal: Negative for neck pain and neck stiffness.  Neurological: Negative for dizziness and headaches.    Allergies Patient is allergic to shellfish allergy; atorvastatin; and livalo [pitavastatin].  Past Medical History Patient  has a past medical history of Arthritis, Atrial fibrillation ([place]), History of chicken pox, Hyperlipidemia, Hypertension, Osteoporosis, and Stroke ([place]).  Surgical History Patient  has a past surgical history that includes Vaginal hysterectomy; Cataract extraction; Open reduction internal fixation (orif) distal radial fracture (Right, 12/28/2012); and Knee arthroscopy (Right, 07/04/2015).  Family History Pateint's family history includes Anemia in her mother; Diabetes in her sister; Early death in her father; Hearing loss in her mother; Heart attack in her mother; Heart disease in her mother; Hypertension in her father, mother, sister, and son; Stroke (age of onset: 68) in her father; Stroke (age of onset: 94) in her sister; Stroke (age of onset: 55) in her mother.  Social History Patient  reports that she quit smoking about 32 years ago. Her smoking use included cigarettes. She has never used smokeless tobacco. She reports that she does not drink alcohol or use drugs.    Objective: Vitals:   10/05/19 1030  Weight: 185 lb (83.9 kg)  Height: 5\' 4"  (1.626 m)    Body mass index is 31.76 kg/m.  Physical Exam     Assessment/plan: 1. Oral candida Telephone only appointment and I can not see anything. discussed we will treat like oral candida with nystatin swish and swallow, but if  not getting better she will have to come in to office next week for exam. She is onboard with this plan. Discussed how to use the swish and swallow. See next week if no improvement.     No follow-ups on file.  Records requested if needed. Time spent  with patient: 12 minutes, of which >50% was spent in obtaining information about her symptoms, reviweing her previous labs, evaluations, and treatments, counseling her about her conditions (please see discussed topics above), and developing a plan to further investigate it; she had a number of questions which I addressed.    Orma Flaming, MD [place]  10/05/2019

## 2019-10-05 NOTE — Telephone Encounter (Signed)
Patient called in and said she was having a sore throat and a white covering on her tongue for over a week. Patient would like to see if she could be worked in today. Please advise.

## 2019-10-11 ENCOUNTER — Other Ambulatory Visit: Payer: Self-pay

## 2019-10-11 ENCOUNTER — Telehealth: Payer: Self-pay | Admitting: Family Medicine

## 2019-10-11 ENCOUNTER — Other Ambulatory Visit: Payer: Self-pay | Admitting: Family Medicine

## 2019-10-11 MED ORDER — ROSUVASTATIN CALCIUM 5 MG PO TABS: 15.0000 mg | ORAL_TABLET | ORAL | 0 refills | Status: DC

## 2019-10-11 NOTE — Telephone Encounter (Signed)
Spoke to patient, she is still having symptoms of oral thrush.  Ok to send in refill of nystatin?  Pls advise, thanks

## 2019-10-11 NOTE — Telephone Encounter (Signed)
Left vm message for patient, requesting a c/b

## 2019-10-11 NOTE — Telephone Encounter (Signed)
Patient called in saying that she had a visit last week with Dr.Wolfe to discuss a yeast infection in the mouth, but she says she is still having problems and says the medicine is not helping, she wanted to know if she needs to follow up this week or when [facility] had a next available. She also wanted to check on her medication, rosuvastatin it is completely out and wanted to know if she can have another refill.

## 2019-10-11 NOTE — Telephone Encounter (Signed)
Requested medication (s) are due for refill today: yes  Requested medication (s) are on the active medication list: yes  Last refill:    Future visit scheduled: yes  Notes to clinic: historical provider    Requested Prescriptions  Pending Prescriptions Disp Refills   rosuvastatin (CRESTOR) 5 MG tablet 108 tablet 3    Sig: Take 3 tablets (15 mg total) by mouth 3 (three) times a week.      Cardiovascular:  Antilipid - Statins Passed - 10/11/2019  8:14 AM      Passed - Total Cholesterol in normal range and within 360 days    Cholesterol, Total  Date Value Ref Range Status  03/28/2019 203 (H) 100 - 199 mg/dL Final  03/31/2013 129 <200 mg/dL Final   Cholesterol  Date Value Ref Range Status  07/11/2019 197 0 - 200 mg/dL Final    Comment:    ATP III Classification       Desirable:  < 200 mg/dL               Borderline High:  200 - 239 mg/dL          High:  > = 240 mg/dL          Passed - LDL in normal range and within 360 days    LDL (calc)  Date Value Ref Range Status  03/31/2013 56 <100 mg/dL Final    Comment:    LDL-C is inaccurate if patient is nonfasting.   Reference Range: ---------------- Optimal:            <100 Near/Above Optimal: 100-129 Borderline High:    130-159 High:               160-189 Very High:          >=190       LDLC SERPL CALC-MCNC  Date Value Ref Range Status  06/15/2014 135 (H) 0 - 99 mg/dL Final    Comment:                              Optimal               <  100                           Above optimal     100 -  129                           Borderline        130 -  159                           High              160 -  189                           Very high             >  189 LDL-C is inaccurate if patient is non-fasting.   LDL Calculated  Date Value Ref Range Status  03/28/2019 93 0 - 99 mg/dL Final   LDL Cholesterol  Date Value Ref Range Status  07/11/2019 97 0 - 99 mg/dL Final   LDL-C  Date Value Ref Range Status  04/22/2016 133 (H) 0 - 99 mg/dL Final    Comment:                              Optimal               <  100                           Above optimal     100 -  129                           Borderline        130 -  159                           High              160 -  189                           Very high             >  189 LDL-C is inaccurate if patient is non-fasting.           Passed - HDL in normal range and within 360 days    HDL-C  Date Value Ref Range Status  03/31/2013 56 >=40 mg/dL Final   HDL Cholesterol by NMR  Date Value Ref Range Status  04/22/2016 69 >39 mg/dL Final   HDL  Date Value Ref Range Status  07/11/2019 69.70 >39.00 mg/dL Final  03/28/2019 78 >39 mg/dL Final          Passed - Triglycerides in normal range and within 360 days    Triglycerides  Date Value Ref Range Status  07/11/2019 148.0 0.0 - 149.0 mg/dL Final    Comment:    Normal:  <150 mg/dLBorderline High:  150 - 199 mg/dL  03/31/2013 87 <150 mg/dL Final   Triglycerides by NMR  Date Value Ref Range Status  04/22/2016 147 0 - 149 mg/dL Final          Passed - Patient is not pregnant      Passed - Valid encounter within last 12 months    Recent Outpatient Visits           6 days ago Oral candida   [place] Wolfe, Allison, MD   2 months ago Skin lesion   [place] Wolfe, Allison, MD   3 months ago Essential hypertension   [place] Wolfe, Allison, MD       Future Appointments             In 3 months Orma Flaming, MD [place], [facility]

## 2019-10-11 NOTE — Telephone Encounter (Signed)
Medication Refill - Medication: rosuvastatin   Has the patient contacted their pharmacy? Yes.   Pt states she has been out of the medication since the weekend. Please advise.  (Agent: If no, request that the patient contact the pharmacy for the refill.) (Agent: If yes, when and what did the pharmacy advise?)  Preferred Pharmacy (with phone number or street name):  [facility] [address], [address] AT [address]  [place] [address]  Phone: [phone] Fax: [phone]  Not a 24 hour pharmacy; exact hours not known.     Agent: Please be advised that RX refills may take up to 3 business days. We ask that you follow-up with your pharmacy.

## 2019-10-12 NOTE — Telephone Encounter (Signed)
Spoke to patient and advised her that Dr. Rogers Blocker wanted to see her back in office.  Appt scheduled for 1/8 @ 11:20 am.  Patient verbalized understanding.

## 2019-10-12 NOTE — Telephone Encounter (Signed)
Attempted to reach patient, there was n/a.  Will try back later this afternoon.

## 2019-10-12 NOTE — Telephone Encounter (Signed)
Patient called back to speak to [facility].

## 2019-10-12 NOTE — Telephone Encounter (Signed)
We need to see her in office on Friday please. If we need to move a TOC or new patient then please do this.   Orma Flaming, MD [place]

## 2019-10-13 ENCOUNTER — Other Ambulatory Visit: Payer: Self-pay

## 2019-10-14 ENCOUNTER — Ambulatory Visit: Payer: Medicare HMO | Admitting: Family Medicine

## 2019-10-20 NOTE — Telephone Encounter (Signed)
Please call pt and schedule her with another provider since Dr. Rogers Blocker is filled. Please tell pt prescription was sent to pharmacy on 01/06.

## 2019-10-20 NOTE — Telephone Encounter (Signed)
Patient has an appointment tomorrow with someone else, but all other providers have virtual spots or a 20 minute spot in which does not work with patient's schedule and felt like she states she has used up the prescription that was called in on 1/06.

## 2019-10-20 NOTE — Telephone Encounter (Signed)
Patient called back in and stated that the thrush keeps coming back. Patient wanted to see if she could be worked in sooner than the next available in February. She also wanted to check on her medication, rosuvastatin she wanted to know if it was time for a refill. Please advise.

## 2019-10-20 NOTE — Telephone Encounter (Signed)
See note

## 2019-10-21 NOTE — Telephone Encounter (Signed)
Left vm message for patient requesting a c/b.

## 2019-10-26 ENCOUNTER — Telehealth: Payer: Self-pay | Admitting: Cardiology

## 2019-10-26 ENCOUNTER — Ambulatory Visit: Payer: Medicare HMO | Admitting: Family Medicine

## 2019-10-26 NOTE — Telephone Encounter (Signed)
We are recommending the COVID-19 vaccine to all of our patients. Cardiac medications (including blood thinners) should not deter anyone from being vaccinated and there is no need to hold any of those medications prior to vaccine administration.     Currently, there is a hotline to call (active 10/14/19) to schedule vaccination appointments as no walk-ins will be accepted.   Number: [phone].    If an appointment is not available please go to [email or web address] to sign up for notification when additional vaccine appointments are available.   If you have further questions or concerns about the vaccine process, please visit [email or web address] or contact your primary care physician.   I have informed Nancy Mccormick and patient of instructions.

## 2019-11-14 ENCOUNTER — Ambulatory Visit (INDEPENDENT_AMBULATORY_CARE_PROVIDER_SITE_OTHER): Payer: Medicare HMO | Admitting: Family Medicine

## 2019-11-14 ENCOUNTER — Ambulatory Visit (INDEPENDENT_AMBULATORY_CARE_PROVIDER_SITE_OTHER): Payer: Medicare HMO

## 2019-11-14 ENCOUNTER — Encounter: Payer: Self-pay | Admitting: Family Medicine

## 2019-11-14 ENCOUNTER — Other Ambulatory Visit: Payer: Self-pay

## 2019-11-14 VITALS — BP 120/72 | HR 60 | Temp 98.0°F | Ht 64.0 in | Wt 187.6 lb

## 2019-11-14 DIAGNOSIS — K219 Gastro-esophageal reflux disease without esophagitis: Secondary | ICD-10-CM | POA: Diagnosis not present

## 2019-11-14 DIAGNOSIS — Z0000 Encounter for general adult medical examination without abnormal findings: Secondary | ICD-10-CM | POA: Diagnosis not present

## 2019-11-14 MED ORDER — FAMOTIDINE 20 MG PO TABS: 20.0000 mg | ORAL_TABLET | Freq: Two times a day (BID) | ORAL | 3 refills | Status: DC

## 2019-11-14 NOTE — Progress Notes (Signed)
For her medicare visit.  I have reviewed the documentation from the recent AWV done by Denman George; I agree with the documentation and will follow up on any recommendations or abnormal findings as suggested. Orma Flaming, MD [place]

## 2019-11-14 NOTE — Progress Notes (Signed)
Subjective:   Nancy Mccormick is a 86 y.o. female who presents for Medicare Annual (Subsequent) preventive examination.  Review of Systems:   Cardiac Risk Factors include: advanced age (>23men, >56 women);hypertension;dyslipidemia    Objective:     Vitals: BP 120/72   Pulse 60   Temp 98 F (36.7 C) (Temporal)   Ht 5\' 4"  (1.626 m)   Wt 187 lb 9.8 oz (85.1 kg)   BMI 32.20 kg/m   Body mass index is 32.2 kg/m.  Advanced Directives 11/14/2019 08/26/2018 06/30/2018 06/05/2018 06/05/2018 08/30/2017 07/04/2015  Does Patient Have a Medical Advance Directive? Yes Yes Yes Yes Yes No No  Type of Advance Directive Living will;Healthcare Power of [place];Living will [place];Living will [place];Living will [place];Living will - -  Does patient want to make changes to medical advance directive? No - Patient declined No - Patient declined - No - Patient declined - - -  Copy of [place] in Chart? No - copy requested Yes - validated most recent copy scanned in chart (See row information) - Yes - - -  Would patient like information on creating a medical advance directive? - - - - - No - Patient declined No - patient declined information  Pre-existing out of facility DNR order (yellow form or pink MOST form) - - - - - - -    Tobacco Social History   Tobacco Use  Smoking Status Former Smoker  . Types: Cigarettes  . Quit date: 12/17/1986  . Years since quitting: 32.9  Smokeless Tobacco Never Used  Tobacco Comment   Smoke briefly in the distant past     Counseling given: Not Answered Comment: Smoke briefly in the distant past   Clinical Intake:  Pre-visit preparation completed: Yes  Pain : No/denies pain  Diabetes: No  How often do you need to have someone help you when you read instructions, pamphlets, or other written materials from your doctor or pharmacy?: 1 -  Never  Interpreter Needed?: No  Information entered by :: Denman George LPN  Past Medical History:  Diagnosis Date  . Arthritis    back, left shoulder, and fingers  . Atrial fibrillation ([place])   . History of chicken pox   . Hyperlipidemia   . Hypertension   . Osteoporosis   . Stroke [facility])    Past Surgical History:  Procedure Laterality Date  . CATARACT EXTRACTION    . KNEE ARTHROSCOPY Right 07/04/2015   Procedure: RIGHT KNEE ARTHROSCOPY WITH MEDIAL MENISCAL DEBRIDEMENT AND CHONDROPLASTY;  Surgeon: Gaynelle Arabian, MD;  Location: [facility];  Service: Orthopedics;  Laterality: Right;  . OPEN REDUCTION INTERNAL FIXATION (ORIF) DISTAL RADIAL FRACTURE Right 12/28/2012   Procedure: OPEN REDUCTION INTERNAL FIXATION (ORIF) DISTAL RADIAL FRACTURE;  Surgeon: Hessie Dibble, MD;  Location: [facility];  Service: Orthopedics;  Laterality: Right;  Marland Kitchen VAGINAL HYSTERECTOMY     Family History  Problem Relation Age of Onset  . Stroke Mother 79  . Anemia Mother   . Heart disease Mother   . Hearing loss Mother   . Heart attack Mother   . Hypertension Mother   . Stroke Father 25  . Early death Father   . Hypertension Father   . Stroke Sister 40  . Diabetes Sister   . Hypertension Sister   . Hypertension Son    Social History   Socioeconomic History  . Marital status: Widowed  Spouse name: Not on file  . Number of children: 3  . Years of education: Not on file  . Highest education level: Not on file  Occupational History  . 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 Smoking status: Former Smoker    Types: Cigarettes    Quit date: 12/17/1986    Years since quitting: 32.9  . Smokeless tobacco: Never Used  . Tobacco comment: Smoke briefly in the distant past  Substance and Sexual Activity  . Alcohol use: No  . Drug use: No  . Sexual activity: Not Currently  Other Topics Concern  . Not on file  Social History Narrative    Keon lives at home alone since her husband passed in April 2019. She has three sons and three grandsons.None live locally but she gets to talk with them often and sees them pretty regularly. 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  She enjoys having lunch out with friends regularly. She plans to move to a   Social Determinants of Health   Financial Resource Strain:   . Difficulty of Paying Living Expenses: Not on file  Food Insecurity:   . Worried About Charity fundraiser in the Last Year: Not on file  . Ran Out of Food in the Last Year: Not on file  Transportation Needs:   . Lack of Transportation (Medical): Not on file  . Lack of Transportation (Non-Medical): Not on file  Physical Activity:   . Days of Exercise per Week: Not on file  . Minutes of Exercise per Session: Not on file  Stress:   . Feeling of Stress : Not on file  Social Connections:   . Frequency of Communication with Friends and Family: Not on file  . Frequency of Social Gatherings with Friends and Family: Not on file  . Attends Religious Services: Not on file  . Active Member of Clubs or Organizations: Not on file  . Attends Archivist Meetings: Not on file  . Marital Status: Not on file    Outpatient Encounter Medications as of 11/14/2019  Medication Sig  . acetaminophen (TYLENOL) 500 MG tablet Take 1,000 mg by mouth every 6 (six) hours as needed for mild pain or moderate pain.  Marland Kitchen apixaban (ELIQUIS) 5 MG TABS tablet Take 1 tablet (5 mg total) by mouth 2 (two) times daily.  . Calcium Carbonate-Vitamin D (CALCIUM + D PO) Take 1 tablet by mouth at bedtime.  . Chlorpheniramine-DM (CORICIDIN HBP COUGH/COLD PO) Take by mouth.  . diltiazem (CARDIZEM CD) 240 MG 24 hr capsule Take 1 capsule (240 mg total) by mouth daily.   . Multiple Vitamin (MULTIVITAMIN WITH MINERALS) TABS tablet Take 1 tablet by mouth at bedtime.  . nitroGLYCERIN (NITROSTAT) 0.4 MG SL tablet Place 1 tablet (0.4 mg total) under the tongue as needed.  . nystatin (MYCOSTATIN) 100000 UNIT/ML suspension Take 5 mLs (500,000 Units total) by mouth 4 (four) times daily. (Patient not taking: Reported on 11/14/2019)  . omega-3 acid ethyl esters (LOVAZA) 1 g capsule TAKE 1 CAPSULE (1 G TOTAL) BY MOUTH 2 (TWO) TIMES DAILY. (Patient not taking: Reported on 11/14/2019)  . Psyllium (NAT-RUL PSYLLIUM SEED HUSKS) 500 MG CAPS Take 2 capsules by mouth  daily.  . rosuvastatin (CRESTOR) 5 MG tablet Take 3 tablets (15 mg total) by mouth 3 (three) times a week.   No facility-administered encounter medications on file as of 11/14/2019.    Activities of Daily Living In your present state of health, do you have any difficulty performing the following activities: 11/14/2019 11/14/2019  Hearing? N Y  Comment - Pt says that she is beinning to.  Vision? N Y  Comment - Close up; She has to take glasses off to read.  Difficulty concentrating or making decisions? N N  Walking or climbing stairs? Y Y  Comment intermittently Due to leg surgery.  Dressing or bathing? N N  Doing errands, shopping? N N  Preparing Food and eating ? N -  Using the Toilet? N -  In the past six months, have you accidently leaked urine? N -  Do you have problems with loss of bowel control? N -  Managing your Medications? N -  Managing your Finances? N -  Housekeeping or managing your Housekeeping? N -  Some recent data might be hidden    Patient Care Team: Orma Flaming, MD as PCP - General (Family Medicine) Minus Breeding, MD as PCP - Cardiology (Cardiology) Gaynelle Arabian, MD as Consulting Physician (Orthopedic Surgery) Minus Breeding, MD as Consulting Physician (Cardiology)    Assessment:   This is a routine wellness examination for [place].  Exercise Activities and Dietary  recommendations Current Exercise Habits: The patient does not participate in regular exercise at present  Goals    . Exercise 150 min/wk Moderate Activity       Fall Risk Fall Risk  11/14/2019 11/14/2019 08/26/2018 08/26/2018 08/18/2018  Falls in the past year? 0 0 0 0 0  Comment - - - No stairs at home. has a tub without handrails but does not have trouble getting in and out.  -  Number falls in past yr: 0 - - 0 -  Injury with Fall? 0 - - 0 -  Comment - - - - -  Follow up Falls evaluation completed;Education provided;Falls prevention discussed - - Falls prevention discussed -   Is the patient's home free of loose throw rugs in walkways, pet beds, electrical cords, etc?   yes      Grab bars in the bathroom? yes      Handrails on the stairs?   yes      Adequate lighting?   yes  Timed Get Up and Go performed: completed and within normal timeframe; no gait abnormalities noted   Depression Screen PHQ 2/9 Scores 11/14/2019 07/11/2019 08/26/2018 08/18/2018  PHQ - 2 Score 0 0 0 0  PHQ- 9 Score - - - -     Cognitive Function MMSE - Mini Mental State Exam 08/31/2017 08/25/2017 02/20/2015  Not completed: - - Refused  Orientation to time 5 5 4   Orientation to Place 5 5 5   Registration 3 3 3   Attention/ Calculation 5 5 5   Recall 3 3 3   Language- name 2 objects 1 2 2   Language- repeat 1 1 1   Language- follow 3 step command 3 3 3   Language- read & follow direction 1 1 1   Write a sentence 1 1 1   Copy design 1 1 1   Total score 29 30 29      6CIT Screen 11/14/2019 08/26/2018  What Year? 0 points 0 points  What month? 0 points 0 points  What time? 0 points 0 points  Count back from 20 0 points  0 points  Months in reverse 0 points 0 points  Repeat phrase 0 points 0 points  Total Score 0 0    Immunization History  Administered Date(s) Administered  . Pneumococcal Conjugate-13 12/08/2013  . Pneumococcal Polysaccharide-23 11/11/2016  . Td 06/02/2011  . Tdap 06/02/2011, 08/26/2018  .  Zoster 04/15/2012    Qualifies for Shingles Vaccine?Discussed and patient will check with pharmacy for coverage.  Patient education handout provided   Screening Tests Health Maintenance  Topic Date Due  . MAMMOGRAM  09/14/2020  . TETANUS/TDAP  08/26/2028  . DEXA SCAN  Completed  . PNA vac Low Risk Adult  Completed  . INFLUENZA VACCINE  Discontinued    Cancer Screenings: Lung: Low Dose CT Chest recommended if Age 59-80 years, 30 pack-year currently smoking OR have quit w/in 15years. Patient does not qualify. Breast:  Up to date on Mammogram? Yes   Up to date of Bone Density/Dexa? Yes Colorectal: No longer indicated     Plan:  I have personally reviewed and addressed the Medicare Annual Wellness questionnaire and have noted the following in the patient's chart:  A. Medical and social history B. Use of alcohol, tobacco or illicit drugs  C. Current medications and supplements D. Functional ability and status E.  Nutritional status F.  Physical activity G. Advance directives H. List of other physicians I.  Hospitalizations, surgeries, and ER visits in previous 12 months J.  [place] such as hearing and vision if needed, cognitive and depression L. Referrals, records requested, and appointments- none   In addition, I have reviewed and discussed with patient certain preventive protocols, quality metrics, and best practice recommendations. A written personalized care plan for preventive services as well as general preventive health recommendations were provided to patient.   Signed,  Denman George, LPN  Nurse Health Advisor   Nurse Notes: Patient has had 1st Covid vaccine

## 2019-11-14 NOTE — Patient Instructions (Signed)
Nancy Mccormick , Thank you for taking time to come for your Medicare Wellness Visit. I appreciate your ongoing commitment to your health goals. Please review the following plan we discussed and let me know if I can assist you in the future.   Screening recommendations/referrals: Colorectal Screening: No longer indicated  Mammogram: up to date; last 09/15/19 Bone Density: recommended every 2 years; last 11/17/16   Vision and Dental Exams: Recommended annual ophthalmology exams for early detection of glaucoma and other disorders of the eye Recommended annual dental exams for proper oral hygiene  Vaccinations: Pneumococcal vaccine: up to date; last 11/11/16  Tdap vaccine: up to date; last 08/26/18  Shingles vaccine: Please call your insurance company to determine your out of pocket expense for the Shingrix vaccine. You may receive this vaccine at your local pharmacy. (see attached handout)   Advanced directives:Please bring a copy of your POA (Power of Attorney) and/or Living Will to your next appointment.  Goals: Recommend to drink at least 6-8 8oz glasses of water per day and consume a balanced diet rich in fresh fruits and vegetables.   Next appointment: Please schedule your Annual Wellness Visit with your Nurse Health Advisor in one year.  Preventive Care 23 Years and Older, Female Preventive care refers to lifestyle choices and visits with your health care provider that can promote health and wellness. What does preventive care include?  A yearly physical exam. This is also called an annual well check.  Dental exams once or twice a year.  Routine eye exams. Ask your health care provider how often you should have your eyes checked.  Personal lifestyle choices, including:  Daily care of your teeth and gums.  Regular physical activity.  Eating a healthy diet.  Avoiding tobacco and drug use.  Limiting alcohol use.  Practicing safe sex.  Taking low-dose aspirin every day if  recommended by your health care provider.  Taking vitamin and mineral supplements as recommended by your health care provider. What happens during an annual well check? The services and screenings done by your health care provider during your annual well check will depend on your age, overall health, lifestyle risk factors, and family history of disease. Counseling  Your health care provider may ask you questions about your:  Alcohol use.  Tobacco use.  Drug use.  Emotional well-being.  Home and relationship well-being.  Sexual activity.  Eating habits.  History of falls.  Memory and ability to understand (cognition).  Work and work Statistician.  Reproductive health. Screening  You may have the following tests or measurements:  Height, weight, and BMI.  Blood pressure.  Lipid and cholesterol levels. These may be checked every 5 years, or more frequently if you are over 16 years old.  Skin check.  Lung cancer screening. You may have this screening every year starting at age 12 if you have a 30-pack-year history of smoking and currently smoke or have quit within the past 15 years.  Fecal occult blood test (FOBT) of the stool. You may have this test every year starting at age 52.  Flexible sigmoidoscopy or colonoscopy. You may have a sigmoidoscopy every 5 years or a colonoscopy every 10 years starting at age 71.  Hepatitis C blood test.  Hepatitis B blood test.  Sexually transmitted disease (STD) testing.  Diabetes screening. This is done by checking your blood sugar (glucose) after you have not eaten for a while (fasting). You may have this done every 1-3 years.  Bone density scan.  This is done to screen for osteoporosis. You may have this done starting at age 71.  Mammogram. This may be done every 1-2 years. Talk to your health care provider about how often you should have regular mammograms. Talk with your health care provider about your test results,  treatment options, and if necessary, the need for more tests. Vaccines  Your health care provider may recommend certain vaccines, such as:  Influenza vaccine. This is recommended every year.  Tetanus, diphtheria, and acellular pertussis (Tdap, Td) vaccine. You may need a Td booster every 10 years.  Zoster vaccine. You may need this after age 11.  Pneumococcal 13-valent conjugate (PCV13) vaccine. One dose is recommended after age 28.  Pneumococcal polysaccharide (PPSV23) vaccine. One dose is recommended after age 72. Talk to your health care provider about which screenings and vaccines you need and how often you need them. This information is not intended to replace advice given to you by your health care provider. Make sure you discuss any questions you have with your health care provider. Document Released: 10/19/2015 Document Revised: 06/11/2016 Document Reviewed: 07/24/2015 Elsevier Interactive Patient Education  2017 [place] Prevention in the Home Falls can cause injuries. They can happen to people of all ages. There are many things you can do to make your home safe and to help prevent falls. What can I do on the outside of my home?  Regularly fix the edges of walkways and driveways and fix any cracks.  Remove anything that might make you trip as you walk through a door, such as a raised step or threshold.  Trim any bushes or trees on the path to your home.  Use bright outdoor lighting.  Clear any walking paths of anything that might make someone trip, such as rocks or tools.  Regularly check to see if handrails are loose or broken. Make sure that both sides of any steps have handrails.  Any raised decks and porches should have guardrails on the edges.  Have any leaves, snow, or ice cleared regularly.  Use sand or salt on walking paths during winter.  Clean up any spills in your garage right away. This includes oil or grease spills. What can I do in the  bathroom?  Use night lights.  Install grab bars by the toilet and in the tub and shower. Do not use towel bars as grab bars.  Use non-skid mats or decals in the tub or shower.  If you need to sit down in the shower, use a plastic, non-slip stool.  Keep the floor dry. Clean up any water that spills on the floor as soon as it happens.  Remove soap buildup in the tub or shower regularly.  Attach bath mats securely with double-sided non-slip rug tape.  Do not have throw rugs and other things on the floor that can make you trip. What can I do in the bedroom?  Use night lights.  Make sure that you have a light by your bed that is easy to reach.  Do not use any sheets or blankets that are too big for your bed. They should not hang down onto the floor.  Have a firm chair that has side arms. You can use this for support while you get dressed.  Do not have throw rugs and other things on the floor that can make you trip. What can I do in the kitchen?  Clean up any spills right away.  Avoid walking on wet floors.  Keep items that you use a lot in easy-to-reach places.  If you need to reach something above you, use a strong step stool that has a grab bar.  Keep electrical cords out of the way.  Do not use floor polish or wax that makes floors slippery. If you must use wax, use non-skid floor wax.  Do not have throw rugs and other things on the floor that can make you trip. What can I do with my stairs?  Do not leave any items on the stairs.  Make sure that there are handrails on both sides of the stairs and use them. Fix handrails that are broken or loose. Make sure that handrails are as long as the stairways.  Check any carpeting to make sure that it is firmly attached to the stairs. Fix any carpet that is loose or worn.  Avoid having throw rugs at the top or bottom of the stairs. If you do have throw rugs, attach them to the floor with carpet tape.  Make sure that you have a  light switch at the top of the stairs and the bottom of the stairs. If you do not have them, ask someone to add them for you. What else can I do to help prevent falls?  Wear shoes that:  Do not have high heels.  Have rubber bottoms.  Are comfortable and fit you well.  Are closed at the toe. Do not wear sandals.  If you use a stepladder:  Make sure that it is fully opened. Do not climb a closed stepladder.  Make sure that both sides of the stepladder are locked into place.  Ask someone to hold it for you, if possible.  Clearly mark and make sure that you can see:  Any grab bars or handrails.  First and last steps.  Where the edge of each step is.  Use tools that help you move around (mobility aids) if they are needed. These include:  Canes.  Walkers.  Scooters.  Crutches.  Turn on the lights when you go into a dark area. Replace any light bulbs as soon as they burn out.  Set up your furniture so you have a clear path. Avoid moving your furniture around.  If any of your floors are uneven, fix them.  If there are any pets around you, be aware of where they are.  Review your medicines with your doctor. Some medicines can make you feel dizzy. This can increase your chance of falling. Ask your doctor what other things that you can do to help prevent falls. This information is not intended to replace advice given to you by your health care provider. Make sure you discuss any questions you have with your health care provider. Document Released: 07/19/2009 Document Revised: 02/28/2016 Document Reviewed: 10/27/2014 Elsevier Interactive Patient Education  2017 [employer].

## 2019-11-14 NOTE — Patient Instructions (Signed)
Take medicine first thing in AM and before you go to bed at night.   Let me know how you are doing in one month!   Dr. Rogers Blocker   Food Choices for Gastroesophageal Reflux Disease, Adult When you have gastroesophageal reflux disease (GERD), the foods you eat and your eating habits are very important. Choosing the right foods can help ease your discomfort. Think about working with a nutrition specialist (dietitian) to help you make good choices. What are tips for following this plan?  Meals  Choose healthy foods that are low in fat, such as fruits, vegetables, whole grains, low-fat dairy products, and lean meat, fish, and poultry.  Eat small meals often instead of 3 large meals a day. Eat your meals slowly, and in a place where you are relaxed. Avoid bending over or lying down until 2-3 hours after eating.  Avoid eating meals 2-3 hours before bed.  Avoid drinking a lot of liquid with meals.  Cook foods using methods other than frying. Bake, grill, or broil food instead.  Avoid or limit: ? Chocolate. ? Peppermint or spearmint. ? Alcohol. ? Pepper. ? Black and decaffeinated coffee. ? Black and decaffeinated tea. ? Bubbly (carbonated) soft drinks. ? Caffeinated energy drinks and soft drinks.  Limit high-fat foods such as: ? Fatty meat or fried foods. ? Whole milk, cream, butter, or ice cream. ? Nuts and nut butters. ? Pastries, donuts, and sweets made with butter or shortening.  Avoid foods that cause symptoms. These foods may be different for everyone. Common foods that cause symptoms include: ? Tomatoes. ? Oranges, lemons, and limes. ? Peppers. ? Spicy food. ? Onions and garlic. ? Vinegar. Lifestyle  Maintain a healthy weight. Ask your doctor what weight is healthy for you. If you need to lose weight, work with your doctor to do so safely.  Exercise for at least 30 minutes for 5 or more days each week, or as told by your doctor.  Wear loose-fitting clothes.  Do not  smoke. If you need help quitting, ask your doctor.  Sleep with the head of your bed higher than your feet. Use a wedge under the mattress or blocks under the bed frame to raise the head of the bed. Summary  When you have gastroesophageal reflux disease (GERD), food and lifestyle choices are very important in easing your symptoms.  Eat small meals often instead of 3 large meals a day. Eat your meals slowly, and in a place where you are relaxed.  Limit high-fat foods such as fatty meat or fried foods.  Avoid bending over or lying down until 2-3 hours after eating.  Avoid peppermint and spearmint, caffeine, alcohol, and chocolate. This information is not intended to replace advice given to you by your health care provider. Make sure you discuss any questions you have with your health care provider. Document Revised: 01/13/2019 Document Reviewed: 10/28/2016 Elsevier Patient Education  [place].

## 2019-11-14 NOTE — Progress Notes (Signed)
Patient: Nancy Mccormick MRN: [id number] DOB: 01/01/1934 PCP: Orma Flaming, MD     Subjective:  Chief Complaint  Patient presents with  . coating in mouth  . bad taste in mouth    HPI: The patient is a 86 y.o. female who presents today for soreness in mouth and white coating. I saw her a while ago and gave her swish and swallow that she used for one treatment. It never cleared up the coating in her mouth, but her symptoms have gradually gotten better. She still has what she describes as soreness in her tongue. No actual lesions on her tongue and she states her tongue is still coated when she wakes up in the AM. This seems to disappear or get better throughout the day. She states it is white in color. It is much better and her main thing now is that she has an unpleasant taste in her mouth. It is better after she eats. Morning time is the worst for taste in her mouth. No other skin lesions, no abdominal, no n/v/d.   Review of Systems  Constitutional: Negative for chills, fatigue and fever.  HENT: Negative for sneezing, sore throat and tinnitus.   Eyes: Negative for pain, redness and itching.  Respiratory: Negative for cough, shortness of breath and wheezing.   Cardiovascular: Negative for chest pain, palpitations and leg swelling.  Gastrointestinal: Negative for abdominal pain, nausea and vomiting.  Endocrine: Negative for cold intolerance and heat intolerance.  Genitourinary: Negative for frequency, hematuria and pelvic pain.  Musculoskeletal: Positive for joint swelling. Negative for back pain and neck pain.  Skin: Negative for pallor, rash and wound.  Allergic/Immunologic: Positive for environmental allergies and food allergies.  Neurological: Negative for dizziness, seizures and headaches.  Psychiatric/Behavioral: Negative for behavioral problems and suicidal ideas. The patient is not nervous/anxious.     Allergies Patient is allergic to shellfish allergy; atorvastatin; and  livalo [pitavastatin].  Past Medical History Patient  has a past medical history of Arthritis, Atrial fibrillation ([place]), History of chicken pox, Hyperlipidemia, Hypertension, Osteoporosis, and Stroke ([place]).  Surgical History Patient  has a past surgical history that includes Vaginal hysterectomy; Cataract extraction; Open reduction internal fixation (orif) distal radial fracture (Right, 12/28/2012); and Knee arthroscopy (Right, 07/04/2015).  Family History Pateint's family history includes Anemia in her mother; Diabetes in her sister; Early death in her father; Hearing loss in her mother; Heart attack in her mother; Heart disease in her mother; Hypertension in her father, mother, sister, and son; Stroke (age of onset: 88) in her father; Stroke (age of onset: 84) in her sister; Stroke (age of onset: 75) in her mother.  Social History Patient  reports that she quit smoking about 32 years ago. Her smoking use included cigarettes. She has never used smokeless tobacco. She reports that she does not drink alcohol or use drugs.    Objective: Vitals:   11/14/19 1056  BP: 120/72  Pulse: 60  Temp: 98 F (36.7 C)  TempSrc: Temporal  SpO2: 92%  Weight: 187 lb 9.6 oz (85.1 kg)  Height: 5\' 4"  (1.626 m)    Body mass index is 32.2 kg/m.  Physical Exam Vitals reviewed.  Constitutional:      Appearance: Normal appearance. She is normal weight.  HENT:     Head: Normocephalic and atraumatic.     Mouth/Throat:     Comments: Tongue: no white coating, abnormal plaques or lesions on tongue or oral cavity.  Pulmonary:     Effort: Pulmonary effort is  normal.  Lymphadenopathy:     Cervical: No cervical adenopathy.  Neurological:     General: No focal deficit present.     Mental Status: She is alert and oriented to person, place, and time.        Assessment/plan: 1. Gastroesophageal reflux disease, unspecified whether esophagitis present Reassured her that I do not see any pathological finding  on exam on her tongue or oral cavity. No candida or leukoplakia. Normal taste bud villi. I wonder if this is more reflux with the bad taste in her mouth. Will do trial of pepcid Bid x 1 month. If not better will send to ENT. She will let me know. Also gave her handout for GERD foods.   Total time of encounter: 25 minutes total time of encounter, including 15 minutes spent in face-to-face patient care. This time includes coordination of care and counseling regarding acute issue above. Remainder of non-face-to-face time involved reviewing chart documents/testing relevant to the patient encounter and documentation in the medical record.  This visit occurred during the SARS-CoV-2 public health emergency.  Safety protocols were in place, including screening questions prior to the visit, additional usage of staff PPE, and extensive cleaning of exam room while observing appropriate contact time as indicated for disinfecting solutions.    Return if symptoms worsen or fail to improve.  Orma Flaming, MD [facility]    11/14/2019

## 2019-11-18 ENCOUNTER — Telehealth: Payer: Self-pay | Admitting: Family Medicine

## 2019-11-18 ENCOUNTER — Other Ambulatory Visit: Payer: Self-pay

## 2019-11-18 MED ORDER — OMEGA-3-ACID ETHYL ESTERS 1 G PO CAPS: 1.0000 | ORAL_CAPSULE | Freq: Two times a day (BID) | ORAL | 1 refills | Status: DC

## 2019-11-18 NOTE — Telephone Encounter (Signed)
Sent to her pharmacy. 

## 2019-11-18 NOTE — Telephone Encounter (Signed)
Pt called stating she asked Dr. Rogers Blocker and CMA to fill a prescription that she had been taking previously. Medication was Omega 3 capsules 1G x 2 daily. Pt is following up on prescription. Please advise.

## 2019-11-18 NOTE — Progress Notes (Signed)
Sent to her pharmacy. 

## 2020-01-09 ENCOUNTER — Encounter: Payer: Self-pay | Admitting: Family Medicine

## 2020-01-09 ENCOUNTER — Ambulatory Visit (INDEPENDENT_AMBULATORY_CARE_PROVIDER_SITE_OTHER): Payer: Medicare HMO | Admitting: Family Medicine

## 2020-01-09 VITALS — BP 130/70 | HR 72 | Temp 98.2°F | Ht 64.0 in | Wt 186.8 lb

## 2020-01-09 DIAGNOSIS — E785 Hyperlipidemia, unspecified: Secondary | ICD-10-CM | POA: Diagnosis not present

## 2020-01-09 DIAGNOSIS — I10 Essential (primary) hypertension: Secondary | ICD-10-CM | POA: Diagnosis not present

## 2020-01-09 LAB — LIPID PANEL
Cholesterol: 178 mg/dL (ref 0–200)
HDL: 70.8 mg/dL (ref >39.00)
LDL Cholesterol: 79 mg/dL (ref 0–99)
NonHDL: 107.52
Total CHOL/HDL Ratio: 3
Triglycerides: 142 mg/dL (ref 0.0–149.0)
VLDL: 28.4 mg/dL (ref 0.0–40.0)

## 2020-01-09 LAB — CBC WITH DIFFERENTIAL/PLATELET
Basophils Absolute: 0 10*3/uL (ref 0.0–0.1)
Basophils Relative: 0.3 % (ref 0.0–3.0)
Eosinophils Absolute: 0.2 10*3/uL (ref 0.0–0.7)
Eosinophils Relative: 2.9 % (ref 0.0–5.0)
HCT: 41.3 % (ref 36.0–46.0)
Hemoglobin: 13.6 g/dL (ref 12.0–15.0)
Lymphocytes Relative: 27.1 % (ref 12.0–46.0)
Lymphs Abs: 2.1 10*3/uL (ref 0.7–4.0)
MCHC: 32.9 g/dL (ref 30.0–36.0)
MCV: 90.7 fl (ref 78.0–100.0)
Monocytes Absolute: 0.8 10*3/uL (ref 0.1–1.0)
Monocytes Relative: 10.1 % (ref 3.0–12.0)
Neutro Abs: 4.5 10*3/uL (ref 1.4–7.7)
Neutrophils Relative %: 59.6 % (ref 43.0–77.0)
Platelets: 321 10*3/uL (ref 150.0–400.0)
RBC: 4.55 Mil/uL (ref 3.87–5.11)
RDW: 14.4 % (ref 11.5–15.5)
WBC: 7.6 10*3/uL (ref 4.0–10.5)

## 2020-01-09 LAB — COMPREHENSIVE METABOLIC PANEL
ALT: 15 U/L (ref 0–35)
AST: 15 U/L (ref 0–37)
Albumin: 4.2 g/dL (ref 3.5–5.2)
Alkaline Phosphatase: 90 U/L (ref 39–117)
BUN: 15 mg/dL (ref 6–23)
CO2: 29 mEq/L (ref 19–32)
Calcium: 9.2 mg/dL (ref 8.4–10.5)
Chloride: 106 mEq/L (ref 96–112)
Creatinine, Ser: 0.7 mg/dL (ref 0.40–1.20)
GFR: 79.82 mL/min (ref >60.00)
Glucose, Bld: 101 mg/dL — ABNORMAL HIGH (ref 70–99)
Potassium: 4.2 mEq/L (ref 3.5–5.1)
Sodium: 144 mEq/L (ref 135–145)
Total Bilirubin: 0.6 mg/dL (ref 0.2–1.2)
Total Protein: 6.4 g/dL (ref 6.0–8.3)

## 2020-01-09 MED ORDER — ROSUVASTATIN CALCIUM 5 MG PO TABS: 5.0000 mg | ORAL_TABLET | ORAL | 0 refills | Status: DC

## 2020-01-09 NOTE — Progress Notes (Signed)
Patient: Nancy Mccormick MRN: [id number] DOB: July 19, 1934 PCP: Orma Flaming, MD     Subjective:  Chief Complaint  Patient presents with  . Hypertension    6 month f/u    HPI: The patient is a 86 y.o. female who presents today for Hypertension follow up.  Hypertension: Here for follow up of hypertension.  Currently on cardizem 240mg . Takes medication as prescribed and denies any side effects. Exercise includes walking. Weight has been stable. Denies any chest pain, headaches, shortness of breath, vision changes, swelling in lower extremities. She will have mild increased in leg swelling if on her feet all day. Does have compression hose, but does not use daily.   Afib: currently on cardizem and chronically anticoagulated with eliquis. NSR.   Hyperlipidemia: on crestor. We increased her dosage to 4x/week and she Is tolerating well. States no intolerance, muscle cramping, stomach pain.  Has had her covid shots.   Review of Systems  Constitutional: Negative for chills, fatigue and fever.  HENT: Negative for dental problem, ear pain, hearing loss and trouble swallowing.   Eyes: Negative for visual disturbance.  Respiratory: Negative for cough, chest tightness, shortness of breath and wheezing.   Cardiovascular: Positive for leg swelling. Negative for chest pain and palpitations.  Gastrointestinal: Negative for abdominal pain, blood in stool, diarrhea and nausea.  Endocrine: Negative for cold intolerance, polydipsia, polyphagia and polyuria.  Genitourinary: Negative for dysuria and hematuria.  Musculoskeletal: Negative for arthralgias.  Skin: Negative for rash.  Neurological: Negative for dizziness, light-headedness and headaches.  Psychiatric/Behavioral: Negative for dysphoric mood and sleep disturbance. The patient is not nervous/anxious.     Allergies Patient is allergic to shellfish allergy; atorvastatin; and livalo [pitavastatin].  Past Medical History Patient  has a past  medical history of Arthritis, Atrial fibrillation ([place]), History of chicken pox, Hyperlipidemia, Hypertension, Osteoporosis, and Stroke ([place]).  Surgical History Patient  has a past surgical history that includes Vaginal hysterectomy; Cataract extraction; Open reduction internal fixation (orif) distal radial fracture (Right, 12/28/2012); and Knee arthroscopy (Right, 07/04/2015).  Family History Pateint's family history includes Anemia in her mother; Diabetes in her sister; Early death in her father; Hearing loss in her mother; Heart attack in her mother; Heart disease in her mother; Hypertension in her father, mother, sister, and son; Stroke (age of onset: 87) in her father; Stroke (age of onset: 36) in her sister; Stroke (age of onset: 52) in her mother.  Social History Patient  reports that she quit smoking about 33 years ago. Her smoking use included cigarettes. She has never used smokeless tobacco. She reports that she does not drink alcohol or use drugs.    Objective: Vitals:   01/09/20 0951  BP: 130/70  Pulse: 72  Temp: 98.2 F (36.8 C)  TempSrc: Temporal  SpO2: 95%  Weight: 186 lb 12.8 oz (84.7 kg)  Height: 5\' 4"  (1.626 m)    Body mass index is 32.06 kg/m.  Physical Exam Vitals reviewed.  Constitutional:      Appearance: Normal appearance. She is well-developed. She is obese.  HENT:     Head: Normocephalic and atraumatic.     Right Ear: Tympanic membrane, ear canal and external ear normal.     Left Ear: Tympanic membrane, ear canal and external ear normal.  Eyes:     Conjunctiva/sclera: Conjunctivae normal.     Pupils: Pupils are equal, round, and reactive to light.  Neck:     Thyroid: No thyromegaly.     Vascular: No carotid bruit.  Cardiovascular:     Rate and Rhythm: Normal rate and regular rhythm.     Pulses: Normal pulses.     Heart sounds: Normal heart sounds. No murmur.  Pulmonary:     Effort: Pulmonary effort is normal.     Breath sounds: Normal breath  sounds.  Abdominal:     General: Bowel sounds are normal. There is no distension.     Palpations: Abdomen is soft.     Tenderness: There is no abdominal tenderness.  Musculoskeletal:     Cervical back: Normal range of motion and neck supple.  Lymphadenopathy:     Cervical: No cervical adenopathy.  Skin:    General: Skin is warm and dry.     Capillary Refill: Capillary refill takes less than 2 seconds.     Findings: No rash.  Neurological:     General: No focal deficit present.     Mental Status: She is alert and oriented to person, place, and time.     Cranial Nerves: No cranial nerve deficit.     Coordination: Coordination normal.     Deep Tendon Reflexes: Reflexes normal.  Psychiatric:        Mood and Affect: Mood normal.        Behavior: Behavior normal.          Clinical Support from 11/14/2019 in [facility]  PHQ-2 Total Score  0      Assessment/plan: 1. Essential hypertension Blood pressure is to goal. Continue current anti-hypertensive medications. Refills not given and routine lab work will be done today. Recommended routine exercise and healthy diet including DASH diet and mediterranean diet. Encouraged weight loss. F/u in 6 months.   - Comprehensive metabolic panel - CBC with Differential/Platelet  2. Dyslipidemia (high LDL; low HDL) Will check lipid panel today after increasing crestor to 4x/week. Goal LDL is less than 70. Tolerating well with increased dosage.  - Lipid panel   This visit occurred during the SARS-CoV-2 public health emergency.  Safety protocols were in place, including screening questions prior to the visit, additional usage of staff PPE, and extensive cleaning of exam room while observing appropriate contact time as indicated for disinfecting solutions.      Return in about 6 months (around 07/10/2020) for routine htn f/u .    Orma Flaming, MD [facility]   01/09/2020

## 2020-01-30 ENCOUNTER — Ambulatory Visit: Payer: Medicare HMO | Admitting: Physician Assistant

## 2020-01-30 ENCOUNTER — Other Ambulatory Visit: Payer: Self-pay

## 2020-01-31 ENCOUNTER — Other Ambulatory Visit: Payer: Medicare HMO

## 2020-01-31 ENCOUNTER — Encounter: Payer: Self-pay | Admitting: Adult Health

## 2020-01-31 ENCOUNTER — Ambulatory Visit (INDEPENDENT_AMBULATORY_CARE_PROVIDER_SITE_OTHER): Payer: Medicare HMO | Admitting: Adult Health

## 2020-01-31 ENCOUNTER — Ambulatory Visit (INDEPENDENT_AMBULATORY_CARE_PROVIDER_SITE_OTHER)
Admission: RE | Admit: 2020-01-31 | Discharge: 2020-01-31 | Disposition: A | Discharge status: Home / Self Care | Payer: Medicare HMO | Source: Ambulatory Visit | Attending: Adult Health | Admitting: Adult Health

## 2020-01-31 VITALS — BP 130/84 | Temp 98.2°F | Wt 188.0 lb

## 2020-01-31 DIAGNOSIS — M79672 Pain in left foot: Secondary | ICD-10-CM | POA: Diagnosis not present

## 2020-01-31 NOTE — Progress Notes (Deleted)
Left foot.

## 2020-01-31 NOTE — Patient Instructions (Signed)
You can have an xray done at [address].   Go to the basement

## 2020-01-31 NOTE — Progress Notes (Signed)
Subjective:    Patient ID: Nancy Mccormick, female    DOB: 11-14-1933, 86 y.o.   MRN: [id number]  HPI 86 year old female who is being evaluated today for an acute issue of left foot pain.  Her symptoms started yesterday [facility]  She reports sudden onset of left foot pain with associated swelling.  She could not finish the entire walk due to the pain and when she went home and took off her shoes she noticed that her entire foot was swollen and she was unable to wiggle her toes.  She took 3 Tylenol and when she woke up this morning her pain and swelling were about 70% improved.  She continues to have some swelling over the dorsal aspect of her left foot.  She denies trauma or aggravating injury such as a fall.  She has not had to use any Tylenol today.  She denies redness or warmth to the area.  No calf pain.  She is not have any pain with ambulation today   Review of Systems See HPI   Past Medical History:  Diagnosis Date  . Arthritis    back, left shoulder, and fingers  . Atrial fibrillation ([place])   . History of chicken pox   . Hyperlipidemia   . Hypertension   . Osteoporosis   . Stroke [facility])     Social History   Socioeconomic History  . Marital status: Widowed    Spouse name: Not on file  . Number of children: 3  . Years of education: Not on file  . Highest education level: Not on file  Occupational History  . 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 Smoking status: Former Smoker    Types: Cigarettes    Quit date: 12/17/1986    Years since quitting: 33.1  . Smokeless tobacco: Never Used  . Tobacco comment: Smoke briefly in the distant past  Substance and Sexual Activity  . Alcohol use: No  . Drug use: No  . Sexual activity: Not Currently  Other Topics Concern  . Not on file  Social History Narrative   Whitni lives at home alone since her husband passed in April 2019. She  has three sons and three grandsons.None live locally but she gets to talk with them often and sees them pretty regularly. 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  She enjoys having lunch out with friends regularly. She plans to move to a   Social Determinants of Health   Financial Resource Strain:   . Difficulty of Paying Living Expenses:   Food Insecurity:   . Worried About Charity fundraiser in the Last Year:   . Arboriculturist in the Last Year:   Transportation Needs:   . Film/video editor (Medical):   Marland Kitchen Lack of Transportation (Non-Medical):   Physical Activity:   . Days of Exercise per Week:   . Minutes of Exercise per Session:   Stress:   . Feeling of Stress :   Social Connections:   . Frequency of Communication with Friends and Family:   . Frequency of Social Gatherings with Friends and Family:   . Attends Religious Services:   .  Active Member of Clubs or Organizations:   . Attends Archivist Meetings:   Marland Kitchen Marital Status:   Intimate Partner Violence:   . Fear of Current or Ex-Partner:   . Emotionally Abused:   Marland Kitchen Physically Abused:   . Sexually Abused:     Past Surgical History:  Procedure Laterality Date  . CATARACT EXTRACTION    . KNEE ARTHROSCOPY Right 07/04/2015   Procedure: RIGHT KNEE ARTHROSCOPY WITH MEDIAL MENISCAL DEBRIDEMENT AND CHONDROPLASTY;  Surgeon: Gaynelle Arabian, MD;  Location: [facility];  Service: Orthopedics;  Laterality: Right;  . OPEN REDUCTION INTERNAL FIXATION (ORIF) DISTAL RADIAL FRACTURE Right 12/28/2012   Procedure: OPEN REDUCTION INTERNAL FIXATION (ORIF) DISTAL RADIAL FRACTURE;  Surgeon: Hessie Dibble, MD;  Location: [facility];  Service: Orthopedics;  Laterality: Right;  Marland Kitchen VAGINAL HYSTERECTOMY      Family History  Problem  Relation Age of Onset  . Stroke Mother 74  . Anemia Mother   . Heart disease Mother   . Hearing loss Mother   . Heart attack Mother   . Hypertension Mother   . Stroke Father 77  . Early death Father   . Hypertension Father   . Stroke Sister 39  . Diabetes Sister   . Hypertension Sister   . Hypertension Son     Allergies  Allergen Reactions  . Shellfish Allergy Itching, Swelling and Rash  . Atorvastatin Other (See Comments)    myalgias   . Livalo [Pitavastatin] Other (See Comments)    myalgias    Current Outpatient Medications on File Prior to Visit  Medication Sig Dispense Refill  . acetaminophen (TYLENOL) 500 MG tablet Take 1,000 mg by mouth every 6 (six) hours as needed for mild pain or moderate pain.    Marland Kitchen apixaban (ELIQUIS) 5 MG TABS tablet Take 1 tablet (5 mg total) by mouth 2 (two) times daily. 180 tablet 1  . Calcium Carbonate-Vitamin D (CALCIUM + D PO) Take 1 tablet by mouth at bedtime.    Rolena Infante [place] 450 MG CAPS Take by mouth.    . Chlorpheniramine-DM (CORICIDIN HBP COUGH/COLD PO) Take by mouth.    . diltiazem (CARDIZEM CD) 240 MG 24 hr capsule Take 1 capsule (240 mg total) by mouth daily. 90 capsule 3  . Multiple Vitamin (MULTIVITAMIN WITH MINERALS) TABS tablet Take 1 tablet by mouth at bedtime.    . nitroGLYCERIN (NITROSTAT) 0.4 MG SL tablet Place 1 tablet (0.4 mg total) under the tongue as needed. 25 tablet 11  . omega-3 acid ethyl esters (LOVAZA) 1 g capsule Take 1 capsule (1 g total) by mouth 2 (two) times daily. 180 capsule 1  . Psyllium (NAT-RUL PSYLLIUM SEED HUSKS) 500 MG CAPS Take 2 capsules by mouth daily.    . rosuvastatin (CRESTOR) 5 MG tablet Take 1 tablet (5 mg total) by mouth 4 (four) times a week. 108 tablet 0   No current facility-administered medications on file prior to visit.    BP 130/84   Temp 98.2 F (36.8 C)   Wt 188 lb (85.3 kg)   BMI 32.27 kg/m       Objective:   Physical Exam Vitals and nursing note reviewed.    Constitutional:      Appearance: Normal appearance.  Cardiovascular:     Pulses:          Dorsalis pedis pulses are 2+ on the right side and 2+ on the left side.  Musculoskeletal:  General: Swelling and tenderness present. Normal range of motion.       Feet:     Comments: She does have tenderness to dorsal and plantar aspects of left foot as well is along the distal and medial ridges of her left foot.  No deformity noted.  No calf pain, redness, or warmth noted.  Skin:    General: Skin is warm and dry.     Capillary Refill: Capillary refill takes less than 2 seconds.  Neurological:     General: No focal deficit present.     Mental Status: She is alert and oriented to person, place, and time.  Psychiatric:        Mood and Affect: Mood normal.        Behavior: Behavior normal.        Thought Content: Thought content normal.        Judgment: Judgment normal.       Assessment & Plan:  1. Left foot pain -X-ray to rule out stress fracture.  Not concerned for DVT, cellulitis, or gout.  She was advised to ice, use compression, and elevate tonight while we wait for x-ray results to come back. - DG Foot Complete Left; Future   Dorothyann Peng, NP

## 2020-02-01 ENCOUNTER — Telehealth: Payer: Self-pay | Admitting: Adult Health

## 2020-02-01 NOTE — Telephone Encounter (Signed)
Updated patient on her foot x-ray that did not show any acute fracture or dislocation.  She does report that it continues to be slightly tender and swollen but is improving.  Advised to continue to rest, ice, and elevate.  Follow-up with PCP if it does not resolve in the next week

## 2020-02-13 ENCOUNTER — Other Ambulatory Visit: Payer: Self-pay | Admitting: *Deleted

## 2020-02-13 MED ORDER — APIXABAN 5 MG PO TABS: 5.0000 mg | ORAL_TABLET | Freq: Two times a day (BID) | ORAL | 1 refills | Status: DC

## 2020-05-09 ENCOUNTER — Ambulatory Visit: Payer: Medicare HMO | Admitting: Family Medicine

## 2020-05-24 ENCOUNTER — Other Ambulatory Visit: Payer: Self-pay

## 2020-05-24 ENCOUNTER — Ambulatory Visit (INDEPENDENT_AMBULATORY_CARE_PROVIDER_SITE_OTHER): Payer: Medicare HMO | Admitting: Family Medicine

## 2020-05-24 ENCOUNTER — Encounter: Payer: Self-pay | Admitting: Family Medicine

## 2020-05-24 VITALS — BP 142/70 | HR 69 | Temp 98.3°F | Ht 64.0 in | Wt 190.0 lb

## 2020-05-24 DIAGNOSIS — M25532 Pain in left wrist: Secondary | ICD-10-CM

## 2020-05-24 MED ORDER — PREDNISONE 20 MG PO TABS: 40.0000 mg | ORAL_TABLET | Freq: Every day | ORAL | 0 refills | Status: DC

## 2020-05-24 NOTE — Progress Notes (Signed)
Patient: Nancy Mccormick MRN: [id number] DOB: 07-02-1934 PCP: Nancy Flaming, MD     Subjective:  Chief Complaint  Patient presents with  . Wrist Pain    HPI: The patient is a 86 y.o. female who presents today for right wrist pain. Starting 5 days ago. She has not taken anything for it, she say she has rubbed salve (Hemp oil) on it. It is better in the AM, but gets worse at night. She cant remember a precipitating factor. She states pain is from her ulnar styloid process. She has pain with wrist rotation. She has mild swelling in his area. Denies any trauma to the area. Never had gout or pseudo gout.   Review of Systems  Constitutional: Negative for chills and fever.  Respiratory: Negative for shortness of breath and wheezing.   Musculoskeletal: Positive for arthralgias and joint swelling.    Allergies Patient is allergic to shellfish allergy, atorvastatin, and livalo [pitavastatin].  Past Medical History Patient  has a past medical history of Arthritis, Atrial fibrillation ([place]), History of chicken pox, Hyperlipidemia, Hypertension, Osteoporosis, and Stroke ([place]).  Surgical History Patient  has a past surgical history that includes Vaginal hysterectomy; Cataract extraction; Open reduction internal fixation (orif) distal radial fracture (Right, 12/28/2012); and Knee arthroscopy (Right, 07/04/2015).  Family History Pateint's family history includes Anemia in her mother; Diabetes in her sister; Early death in her father; Hearing loss in her mother; Heart attack in her mother; Heart disease in her mother; Hypertension in her father, mother, sister, and son; Stroke (age of onset: 19) in her father; Stroke (age of onset: 6) in her sister; Stroke (age of onset: 68) in her mother.  Social History Patient  reports that she quit smoking about 33 years ago. Her smoking use included cigarettes. She has never used smokeless tobacco. She reports that she does not drink alcohol and does not use  drugs.    Objective: Vitals:   05/24/20 1432  BP: (!) 142/70  Pulse: 69  Temp: 98.3 F (36.8 C)  TempSrc: Temporal  SpO2: 96%  Weight: 190 lb (86.2 kg)  Height: 5\' 4"  (1.626 m)    Body mass index is 32.61 kg/m.  Physical Exam Vitals reviewed.  Constitutional:      Appearance: Normal appearance. She is obese.  HENT:     Head: Normocephalic and atraumatic.  Pulmonary:     Effort: Pulmonary effort is normal.  Musculoskeletal:     Comments: Right wrist: edema over ulnar styloid process and very tender to point palpation. Fluctuant. No erythema. Very painful with varus strain and painful with wrist rotation.   Neurological:     Mental Status: She is alert.        Assessment/plan: 1. Acute pain of left wrist Bursitis vs. tendionopathy vs. Pseudogout vs. Ganglion cyst. voltaren gel qid, splint and oral steroids. Referral to sports med as it will likely need ultrasound guided aspiration and possible injection if not improved with conservative measures.  - Ambulatory referral to Sports Medicine    This visit occurred during the SARS-CoV-2 public health emergency.  Safety protocols were in place, including screening questions prior to the visit, additional usage of staff PPE, and extensive cleaning of exam room while observing appropriate contact time as indicated for disinfecting solutions.     Return if symptoms worsen or fail to improve.   Nancy Flaming, MD [place]   05/24/2020

## 2020-05-24 NOTE — Patient Instructions (Addendum)
I think you have bursitis vs. Tendon issue.  im going to put you on oral steroids and then refer to sports medicine as this usually does well with drainage and then injection.   -do ice, voltaren gel four times a day -can get thumb spica splint to keep wrist still.  -they will call you with referral.    Bursitis  Bursitis is inflammation and irritation of a bursa, which is one of the small, fluid-filled sacs that cushion and protect the moving parts of your body. These sacs are located between bones and muscles, bones and muscle attachments, or bones and skin areas that are next to bones. A bursa protects those structures from the wear and tear that results from frequent movement. An inflamed bursa causes pain and swelling. Fluid may build up inside the sac. Bursitis is most common near joints, especially the knees, elbows, hips, and shoulders. What are the causes? This condition may be caused by:  Injury from: ? A direct hit (blow), like falling on your knee or elbow. ? Overuse of a joint (repetitive stress).  Infection. This can happen if bacteria get into a bursa through a cut or scrape near a joint.  Diseases that cause joint inflammation, such as gout and rheumatoid arthritis. What increases the risk? You are more likely to develop this condition if you:  Have a job or hobby that involves a lot of repetitive stress on your joints.  Have a condition that weakens your body's defense system (immune system), such as diabetes, cancer, or HIV.  Do any of these often: ? Lift and reach overhead. ? Kneel or lean on hard surfaces. ? Run or walk. What are the signs or symptoms? The most common symptoms of this condition include:  Pain that gets worse when you move the affected body part or use it to support (bear) your body weight.  Inflammation.  Stiffness. Other symptoms include:  Redness.  Swelling.  Tenderness.  Warmth.  Pain that continues after rest.  Fever or  chills. These may occur in bursitis that is caused by infection. How is this diagnosed? This condition may be diagnosed based on:  Your medical history and a physical exam.  Imaging tests, such as an MRI.  A procedure to drain fluid from the bursa with a needle (aspiration). The fluid may be checked for signs of infection or gout.  Blood tests to rule out other causes of inflammation. How is this treated? This condition can usually be treated at home with rest, ice, applying pressure (compression), and raising the body part that is affected (elevation). This is called RICE therapy. For mild bursitis, RICE therapy may be all you need. Other treatments may include:  NSAIDs to treat pain and inflammation.  Corticosteroid medicines to fight inflammation. These medicines may be injected into and around the area of bursitis.  Aspiration of fluid from the bursa to relieve pain and improve movement.  Antibiotic medicine to treat an infected bursa.  A splint, brace, or walking aid, such as a cane.  Physical therapy if you continue to have pain or limited movement.  Surgery to remove a damaged or infected bursa. This may be needed if other treatments have not worked. Follow these instructions at home: Medicines  Take over-the-counter and prescription medicines only as told by your health care provider.  If you were prescribed an antibiotic medicine, take it as told by your health care provider. Do not stop taking the antibiotic even if you start to  feel better. General instructions   Rest the affected area as told by your health care provider. ? If possible, raise (elevate) the affected area above the level of your heart while you are sitting or lying down. ? Avoid activities that make pain worse.  Use splints, braces, pads, or walking aids as told by your health care provider.  If directed, put ice on the affected area: ? If you have a removable splint or brace, remove it as told by  your health care provider. ? Put ice in a plastic bag. ? Place a towel between your skin and the bag or between your splint or brace and the bag. ? Leave the ice on for 20 minutes, 2-3 times a day.  Keep all follow-up visits as told by your health care provider. This is important. Preventing future episodes Take actions to help prevent future episodes of bursitis.  Wear knee pads if you kneel often.  Wear sturdy running or walking shoes that fit you well.  Take breaks regularly from repetitive activity.  Warm up by stretching before doing any activity that takes a lot of effort.  Maintain a healthy weight or lose weight as recommended by your health care provider. If you need help doing this, ask your health care provider.  Exercise regularly. Start any new physical activity gradually. Contact a health care provider if you:  Have a fever.  Have chills.  Have bursitis that is not getting better with treatment or home care. Summary  Bursitis is inflammation and irritation of a bursa, which is one of the small, fluid-filled sacs that cushion and protect the moving parts of your body.  An inflamed bursa causes pain and swelling.  Bursitis is commonly diagnosed with a physical exam, but other tests are sometimes needed.  This condition can usually be treated at home with rest, ice, applying pressure (compression), and raising the body part that is affected (elevation). This is called RICE therapy. This information is not intended to replace advice given to you by your health care provider. Make sure you discuss any questions you have with your health care provider. Document Revised: 09/04/2017 Document Reviewed: 08/07/2017 Elsevier Patient Education  2020 [employer].

## 2020-07-12 ENCOUNTER — Encounter: Payer: Self-pay | Admitting: Family Medicine

## 2020-07-12 ENCOUNTER — Other Ambulatory Visit: Payer: Self-pay

## 2020-07-12 ENCOUNTER — Ambulatory Visit: Payer: Medicare HMO | Admitting: Family Medicine

## 2020-07-12 VITALS — BP 126/76 | HR 70 | Temp 97.2°F | Ht 64.0 in | Wt 187.8 lb

## 2020-07-12 DIAGNOSIS — E785 Hyperlipidemia, unspecified: Secondary | ICD-10-CM | POA: Diagnosis not present

## 2020-07-12 DIAGNOSIS — I10 Essential (primary) hypertension: Secondary | ICD-10-CM | POA: Diagnosis not present

## 2020-07-12 DIAGNOSIS — M8580 Other specified disorders of bone density and structure, unspecified site: Secondary | ICD-10-CM

## 2020-07-12 MED ORDER — ROSUVASTATIN CALCIUM 5 MG PO TABS: 5.0000 mg | ORAL_TABLET | Freq: Every day | ORAL | 3 refills | Status: DC

## 2020-07-12 NOTE — Patient Instructions (Addendum)
-  90 day supply of crestor sent in for you. Can try daily, but if any issues back down again.   -stop the fish oil. Would do one/day for a few days then stop. I want you to do labs only in 3 months. (just need lab appointment).    - I also ordered a bone scan for you, they should call you.   -see you in 6 months. I love seeing you!  Aw

## 2020-07-12 NOTE — Progress Notes (Signed)
Patient: Nancy Mccormick MRN: [id number] DOB: 1934-01-10 PCP: Orma Flaming, MD     Subjective:  Chief Complaint  Patient presents with  . Hypertension  . Hyperlipidemia    HPI: The patient is a 86 y.o. female who presents today for HTN. She has no concerns today.  Hypertension Here for follow up of hypertension.  Currently on cardizem 240mg . Takes medication as prescribed and denies any side effects. Exercise includes walking. Weight has been stable. Denies any chest pain, headaches, shortness of breath, vision changes, swelling in lower extremities. She will have mild increased in leg swelling if on her feet all day. Does have compression hose, but does not use daily.   Afib currently on cardizem and chronically anticoagulated with eliquis. NSR.   Hyperlipidemia on crestor 5x/week and she Is tolerating well. States no intolerance, muscle cramping, stomach pain. Last lipid panel was in 01/2020 and was to goal. She has been on lovaza x 10 years and thinks this was started when she couldn't tolerate the statin. Wondering if she can stop this.   Has had her covid shots.   Review of Systems  Constitutional: Negative for chills, fatigue and fever.  HENT: Negative for dental problem, ear pain, hearing loss and trouble swallowing.   Eyes: Negative for visual disturbance.  Respiratory: Negative for cough, chest tightness, shortness of breath and wheezing.   Cardiovascular: Negative for chest pain, palpitations and leg swelling.  Gastrointestinal: Negative for abdominal pain, blood in stool, diarrhea and nausea.  Endocrine: Negative for cold intolerance, polydipsia, polyphagia and polyuria.  Genitourinary: Negative for dysuria, frequency, hematuria and urgency.  Musculoskeletal: Negative for arthralgias.  Skin: Negative for rash.  Neurological: Negative for dizziness, light-headedness and headaches.  Psychiatric/Behavioral: Negative for dysphoric mood and sleep disturbance. The  patient is not nervous/anxious.     Allergies Patient is allergic to shellfish allergy, atorvastatin, and livalo [pitavastatin].  Past Medical History Patient  has a past medical history of Arthritis, Atrial fibrillation ([place]), History of chicken pox, Hyperlipidemia, Hypertension, Osteoporosis, and Stroke ([place]).  Surgical History Patient  has a past surgical history that includes Vaginal hysterectomy; Cataract extraction; Open reduction internal fixation (orif) distal radial fracture (Right, 12/28/2012); and Knee arthroscopy (Right, 07/04/2015).  Family History Pateint's family history includes Anemia in her mother; Diabetes in her sister; Early death in her father; Hearing loss in her mother; Heart attack in her mother; Heart disease in her mother; Hypertension in her father, mother, sister, and son; Stroke (age of onset: 58) in her father; Stroke (age of onset: 65) in her sister; Stroke (age of onset: 47) in her mother.  Social History Patient  reports that she quit smoking about 33 years ago. Her smoking use included cigarettes. She has never used smokeless tobacco. She reports that she does not drink alcohol and does not use drugs.    Objective: Vitals:   07/12/20 0922  BP: 126/76  Pulse: 70  Temp: (!) 97.2 F (36.2 C)  TempSrc: Temporal  SpO2: 95%  Weight: 187 lb 12.8 oz (85.2 kg)  Height: 5\' 4"  (1.626 m)    Body mass index is 32.24 kg/m.  Physical Exam Vitals reviewed.  Constitutional:      Appearance: Normal appearance. She is well-developed and normal weight.  HENT:     Head: Normocephalic and atraumatic.     Right Ear: External ear normal.     Left Ear: External ear normal.  Eyes:     Conjunctiva/sclera: Conjunctivae normal.     Pupils: Pupils  are equal, round, and reactive to light.  Neck:     Thyroid: No thyromegaly.  Cardiovascular:     Rate and Rhythm: Normal rate and regular rhythm.     Pulses: Normal pulses.     Heart sounds: Normal heart sounds. No  murmur heard.   Pulmonary:     Effort: Pulmonary effort is normal.     Breath sounds: Normal breath sounds.  Abdominal:     General: Bowel sounds are normal. There is no distension.     Palpations: Abdomen is soft.     Tenderness: There is no abdominal tenderness.  Musculoskeletal:     Cervical back: Normal range of motion and neck supple.  Lymphadenopathy:     Cervical: No cervical adenopathy.  Skin:    General: Skin is warm and dry.     Capillary Refill: Capillary refill takes less than 2 seconds.     Findings: No rash.  Neurological:     General: No focal deficit present.     Mental Status: She is alert and oriented to person, place, and time.     Cranial Nerves: No cranial nerve deficit.     Coordination: Coordination normal.     Deep Tendon Reflexes: Reflexes normal.  Psychiatric:        Mood and Affect: Mood normal.        Behavior: Behavior normal.        Assessment/plan: 1. Essential hypertension Blood pressure is to goal. Continue current anti-hypertensive medications-cardizem 240mg /day. Refills not given and routine lab work will be done today. Recommended routine exercise and healthy diet including DASH diet and mediterranean diet. Encouraged weight loss. F/u in 6 months.   - CBC with Differential/Platelet; Future - COMPLETE METABOLIC PANEL WITH GFR; Future - COMPLETE METABOLIC PANEL WITH GFR - CBC with Differential/Platelet  2. Dyslipidemia (high LDL; low HDL) Tolerating crestor 5x/week. Wants to try daily and we are going to stop her lovaza. Recheck cholesterol in 3 months. Lab only.   3. Osteopenia, unspecified location  - DG Bone Density; Future   This visit occurred during the SARS-CoV-2 public health emergency.  Safety protocols were in place, including screening questions prior to the visit, additional usage of staff PPE, and extensive cleaning of exam room while observing appropriate contact time as indicated for disinfecting solutions.       Return in about 6 months (around 01/10/2021) for routine appointment. (3 months lab only).    Orma Flaming, MD [place]   07/12/2020

## 2020-07-13 LAB — COMPLETE METABOLIC PANEL WITH GFR
AG Ratio: 1.7 (calc) (ref 1.0–2.5)
ALT: 17 U/L (ref 6–29)
AST: 14 U/L (ref 10–35)
Albumin: 4.1 g/dL (ref 3.6–5.1)
Alkaline phosphatase (APISO): 100 U/L (ref 37–153)
BUN: 16 mg/dL (ref 7–25)
CO2: 23 mmol/L (ref 20–32)
Calcium: 9.5 mg/dL (ref 8.6–10.4)
Chloride: 107 mmol/L (ref 98–110)
Creat: 0.67 mg/dL (ref 0.60–0.88)
GFR, Est African American: 94 mL/min/{1.73_m2} (ref >=60)
GFR, Est Non African American: 81 mL/min/{1.73_m2} (ref >=60)
Globulin: 2.4 g/dL (calc) (ref 1.9–3.7)
Glucose, Bld: 101 mg/dL — ABNORMAL HIGH (ref 65–99)
Potassium: 4.2 mmol/L (ref 3.5–5.3)
Sodium: 143 mmol/L (ref 135–146)
Total Bilirubin: 0.5 mg/dL (ref 0.2–1.2)
Total Protein: 6.5 g/dL (ref 6.1–8.1)

## 2020-07-13 LAB — CBC WITH DIFFERENTIAL/PLATELET
Absolute Monocytes: 926 cells/uL (ref 200–950)
Basophils Absolute: 94 cells/uL (ref 0–200)
Basophils Relative: 0.9 %
Eosinophils Absolute: 218 cells/uL (ref 15–500)
Eosinophils Relative: 2.1 %
HCT: 40.6 % (ref 35.0–45.0)
Hemoglobin: 13.6 g/dL (ref 11.7–15.5)
Lymphs Abs: 2527 cells/uL (ref 850–3900)
MCH: 30.4 pg (ref 27.0–33.0)
MCHC: 33.5 g/dL (ref 32.0–36.0)
MCV: 90.6 fL (ref 80.0–100.0)
MPV: 10.3 fL (ref 7.5–12.5)
Monocytes Relative: 8.9 %
Neutro Abs: 6635 cells/uL (ref 1500–7800)
Neutrophils Relative %: 63.8 %
Platelets: 338 10*3/uL (ref 140–400)
RBC: 4.48 10*6/uL (ref 3.80–5.10)
RDW: 13 % (ref 11.0–15.0)
Total Lymphocyte: 24.3 %
WBC: 10.4 10*3/uL (ref 3.8–10.8)

## 2020-07-16 ENCOUNTER — Emergency Department (HOSPITAL_COMMUNITY)
Admission: EM | Admit: 2020-07-16 | Discharge: 2020-07-17 | Disposition: A | Discharge status: Home / Self Care | Payer: Medicare HMO | Attending: Emergency Medicine | Admitting: Emergency Medicine

## 2020-07-16 ENCOUNTER — Encounter (HOSPITAL_COMMUNITY): Payer: Self-pay | Admitting: Emergency Medicine

## 2020-07-16 ENCOUNTER — Other Ambulatory Visit: Payer: Self-pay

## 2020-07-16 DIAGNOSIS — I10 Essential (primary) hypertension: Secondary | ICD-10-CM | POA: Insufficient documentation

## 2020-07-16 DIAGNOSIS — Z7901 Long term (current) use of anticoagulants: Secondary | ICD-10-CM | POA: Insufficient documentation

## 2020-07-16 DIAGNOSIS — Z79899 Other long term (current) drug therapy: Secondary | ICD-10-CM | POA: Diagnosis not present

## 2020-07-16 DIAGNOSIS — K625 Hemorrhage of anus and rectum: Secondary | ICD-10-CM | POA: Diagnosis present

## 2020-07-16 DIAGNOSIS — Z87891 Personal history of nicotine dependence: Secondary | ICD-10-CM | POA: Diagnosis not present

## 2020-07-16 DIAGNOSIS — K921 Melena: Secondary | ICD-10-CM

## 2020-07-16 DIAGNOSIS — Z85828 Personal history of other malignant neoplasm of skin: Secondary | ICD-10-CM | POA: Insufficient documentation

## 2020-07-16 LAB — CBC
HCT: 41.6 % (ref 36.0–46.0)
Hemoglobin: 13.3 g/dL (ref 12.0–15.0)
MCH: 29.7 pg (ref 26.0–34.0)
MCHC: 32 g/dL (ref 30.0–36.0)
MCV: 92.9 fL (ref 80.0–100.0)
Platelets: 362 10*3/uL (ref 150–400)
RBC: 4.48 MIL/uL (ref 3.87–5.11)
RDW: 13.7 % (ref 11.5–15.5)
WBC: 11.1 10*3/uL — ABNORMAL HIGH (ref 4.0–10.5)
nRBC: 0 % (ref 0.0–0.2)

## 2020-07-16 LAB — TYPE AND SCREEN
ABO/RH(D): O POS
Antibody Screen: NEGATIVE

## 2020-07-16 LAB — COMPREHENSIVE METABOLIC PANEL
ALT: 24 U/L (ref 0–44)
AST: 21 U/L (ref 15–41)
Albumin: 3.6 g/dL (ref 3.5–5.0)
Alkaline Phosphatase: 82 U/L (ref 38–126)
Anion gap: 10 (ref 5–15)
BUN: 15 mg/dL (ref 8–23)
CO2: 27 mmol/L (ref 22–32)
Calcium: 9.1 mg/dL (ref 8.9–10.3)
Chloride: 105 mmol/L (ref 98–111)
Creatinine, Ser: 0.67 mg/dL (ref 0.44–1.00)
GFR, Estimated: >60 mL/min (ref >60)
Glucose, Bld: 95 mg/dL (ref 70–99)
Potassium: 3.8 mmol/L (ref 3.5–5.1)
Sodium: 142 mmol/L (ref 135–145)
Total Bilirubin: 0.6 mg/dL (ref 0.3–1.2)
Total Protein: 6.6 g/dL (ref 6.5–8.1)

## 2020-07-16 NOTE — ED Triage Notes (Signed)
Pt states rectal bleeding for a few days and states she has also been spitting up blood at well.

## 2020-07-17 ENCOUNTER — Telehealth: Payer: Self-pay

## 2020-07-17 MED ORDER — HYDROCORTISONE ACETATE 25 MG RE SUPP
25.0000 mg | Freq: Two times a day (BID) | RECTAL | 0 refills | Status: DC
Start: 2020-07-17 — End: 2020-12-17

## 2020-07-17 NOTE — Telephone Encounter (Signed)
Patient was seen in ED  Nurse Assessment Nurse: Waymond Cera, RN, Benjamine Mola Date/Time (Eastern Time): 07/16/2020 4:05:38 PM Confirm and document reason for call. If symptomatic, describe symptoms. ---Caller states that she is having bloody stools. States that she is spitting up black mucous. Does the patient have any new or worsening symptoms? ---Yes Will a triage be completed? ---Yes Related visit to physician within the last 2 weeks? ---No Does the PT have any chronic conditions? (i.e. diabetes, asthma, this includes High risk factors for pregnancy, etc.) ---Yes List chronic conditions. ---high cholesterol on eliquis Is this a behavioral health or substance abuse call? ---No Guidelines Guideline Title Affirmed Question Affirmed Notes Nurse Date/Time [name] Time) Rectal Bleeding [1] MODERATE rectal bleeding (small blood clots, passing blood without stool, or toilet water turns red) AND [2] more than once a day Cantrell, RN, Benjamine Mola 07/16/2020 4:07:06 PM Disp. Time [name] Time) Disposition Final User 07/16/2020 4:11:55 PM Go to ED Now Yes Cantrell, RN, Benjamine Mola PLEASE NOTE: All timestamps contained within this report are represented as [place] Standard Time. CONFIDENTIALTY NOTICE: This fax transmission is intended only for the addressee. It contains information that is legally privileged, confidential or otherwise protected from use or disclosure. If you are not the intended recipient, you are strictly prohibited from reviewing, disclosing, copying using or disseminating any of this information or taking any action in reliance on or regarding this information. If you have received this fax in error, please notify us immediately by telephone so that we can arrange for its return to [place]. Phone: [phone], Toll-Free: [phone], Fax: [phone] Page: 2 of 2 Call Id: [id number] [place] Disagree/Comply Comply Caller Understands Yes PreDisposition InappropriateToAsk Care Advice  Given Per Guideline GO TO ED NOW: * You need to be seen in the Emergency Department. * Leave now. Drive carefully. CARE ADVICE given per Rectal Bleeding (Adult) guideline. DRIVING: * Another adult should drive. BRING MEDICINES: * Bring a list of your current medicines when you go to the Emergency Department (ER). * Bring the pill bottles too. This will help the doctor (or NP/PA) to make certain you are taking the right medicines and the right dose. Referrals GO TO FACILITY OTHER - SPECIFY

## 2020-07-17 NOTE — Telephone Encounter (Signed)
FYI

## 2020-07-17 NOTE — ED Provider Notes (Signed)
[facility] EMERGENCY DEPARTMENT Provider Note   CSN: [id number] Arrival date & time: 07/16/20  1709     History Chief Complaint  Patient presents with  . Rectal Bleeding    Nancy Mccormick is a 86 y.o. female.  86 year old female on Eliquis for A. fib who presents to the emergency department today with some bright red blood in her stools.  Patient states that she had multiple episodes today of bright red blood in her loose stools.  She normally has loose stools.  She has no pain anywhere.  She has not felt faint, shortness of breath, fatigue and she does not feel that she looks pale.  No fevers.  She states that she did cough up some dark sputum earlier today but no hematemesis or dark vomiting or any vomiting at all.  States compliance with Eliquis.  States that she talked to her primary doctor who sent her to the ER for evaluation.   Rectal Bleeding      Past Medical History:  Diagnosis Date  . Arthritis    back, left shoulder, and fingers  . Atrial fibrillation ([place])   . History of chicken pox   . Hyperlipidemia   . Hypertension   . Osteoporosis   . Stroke [facility])     Patient Active Problem List   Diagnosis Date Noted  . 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 Osteoarthritis of knee 01/25/2019  . Atrial fibrillation with RVR ([place]) 06/05/2018  . Statin intolerance 09/01/2017  . CVA (cerebral vascular accident) ([place]) 08/30/2017  . Tear of lateral meniscus of knee 07/03/2015  . Osteopenia 04/19/2014  . Arrhythmia 11/04/2013  . Dyslipidemia (high LDL; low HDL) 09/10/2010  . Essential hypertension 09/10/2010    Past Surgical History:  Procedure Laterality Date  . CATARACT EXTRACTION    . KNEE ARTHROSCOPY Right 07/04/2015   Procedure: RIGHT KNEE ARTHROSCOPY WITH MEDIAL MENISCAL DEBRIDEMENT AND CHONDROPLASTY;  Surgeon: Gaynelle Arabian, MD;  Location: [facility];  Service: Orthopedics;  Laterality: Right;  . OPEN REDUCTION INTERNAL FIXATION (ORIF)  DISTAL RADIAL FRACTURE Right 12/28/2012   Procedure: OPEN REDUCTION INTERNAL FIXATION (ORIF) DISTAL RADIAL FRACTURE;  Surgeon: Hessie Dibble, MD;  Location: [facility];  Service: Orthopedics;  Laterality: Right;  Marland Kitchen VAGINAL HYSTERECTOMY       OB History   No obstetric history on file.     Family History  Problem Relation Age of Onset  . Stroke Mother 34  . Anemia Mother   . Heart disease Mother   . Hearing loss Mother   . Heart attack Mother   . Hypertension Mother   . Stroke Father 22  . Early death Father   . Hypertension Father   . Stroke Sister 70  . Diabetes Sister   . Hypertension Sister   . Hypertension Son     Social History   Tobacco Use  . Smoking status: Former Smoker    Types: Cigarettes    Quit date: 12/17/1986    Years since quitting: 33.6  . Smokeless tobacco: Never Used  . Tobacco comment: Smoke briefly in the distant past  Vaping Use  . Vaping Use: Never used  Substance Use Topics  . Alcohol use: No  . Drug use: No    Home Medications Prior to Admission medications   Medication Sig Start Date End Date Taking? Authorizing Provider  acetaminophen (TYLENOL) 500 MG tablet Take 1,000 mg by mouth every 6 (six) hours as needed for mild pain  or moderate pain.    [provider]  apixaban (ELIQUIS) 5 MG TABS tablet Take 1 tablet (5 mg total) by mouth 2 (two) times daily. 02/13/20   Orma Flaming, MD  Calcium Carbonate-Vitamin D (CALCIUM + D PO) Take 1 tablet by mouth at bedtime.    [provider]  C[place] 450 MG CAPS Take by mouth.    [provider]  Chlorpheniramine-DM (CORICIDIN HBP COUGH/COLD PO) Take by mouth.    [provider]  diltiazem (CARDIZEM CD) 240 MG 24 hr capsule Take 1 capsule (240 mg total) by mouth daily. 07/11/19   Orma Flaming, MD  hydrocortisone (ANUSOL-HC) 25 MG suppository Place 1 suppository (25 mg total) rectally 2 (two) times daily. For 7 days 07/17/20   Jeffrey Graefe, Corene Cornea,  MD  Multiple Vitamin (MULTIVITAMIN WITH MINERALS) TABS tablet Take 1 tablet by mouth at bedtime.    [provider]  nitroGLYCERIN (NITROSTAT) 0.4 MG SL tablet Place 1 tablet (0.4 mg total) under the tongue as needed. 06/20/14   Chipper Herb, MD  predniSONE (DELTASONE) 20 MG tablet Take 2 tablets (40 mg total) by mouth daily with breakfast. 05/24/20   Orma Flaming, MD  Psyllium (NAT-RUL PSYLLIUM SEED HUSKS) 500 MG CAPS Take 2 capsules by mouth daily.    [provider]  rosuvastatin (CRESTOR) 5 MG tablet Take 1 tablet (5 mg total) by mouth daily. 07/12/20   Orma Flaming, MD    Allergies    Shellfish allergy, Atorvastatin, and Livalo [pitavastatin]  Review of Systems   Review of Systems  Gastrointestinal: Positive for hematochezia.  All other systems reviewed and are negative.   Physical Exam Updated Vital Signs BP (!) 180/94   Pulse 76   Temp 97.6 F (36.4 C)   Resp 18   Ht 5\' 4"  (1.626 m)   Wt 83.9 kg   SpO2 95%   BMI 31.76 kg/m   Physical Exam Vitals and nursing note reviewed.  Constitutional:      Appearance: She is well-developed.  HENT:     Head: Normocephalic and atraumatic.     Mouth/Throat:     Mouth: Mucous membranes are moist.     Pharynx: Oropharynx is clear.  Eyes:     Pupils: Pupils are equal, round, and reactive to light.  Cardiovascular:     Rate and Rhythm: Normal rate and regular rhythm.  Pulmonary:     Effort: No respiratory distress.     Breath sounds: No stridor.  Abdominal:     General: Abdomen is flat. There is no distension.  Genitourinary:    Comments: 1 external hemorrhoid and a couple internal hemorrhoids 1 of which has stigmata of previous bleeding.  Chaperoned by nurse, [place] Musculoskeletal:        General: No swelling. Normal range of motion.     Cervical back: Normal range of motion.  Skin:    General: Skin is warm and dry.  Neurological:     General: No focal deficit present.     Mental Status: She is  alert and oriented to person, place, and time.     ED Results / Procedures / Treatments   Labs (all labs ordered are listed, but only abnormal results are displayed) Labs Reviewed  CBC - Abnormal; Notable for the following components:      Result Value   WBC 11.1 (*)    All other components within normal limits  COMPREHENSIVE METABOLIC PANEL  POC OCCULT BLOOD, ED  TYPE AND  SCREEN    EKG None  Radiology No results found.  Procedures Procedures (including critical care time)  Medications Ordered in ED Medications - No data to display  ED Course  I have reviewed the triage vital signs and the nursing notes.  Pertinent labs & imaging results that were available during my care of the patient were reviewed by me and considered in my medical decision making (see chart for details).    MDM Rules/Calculators/A&P                          Patient has been emergency room for about 8 hours and is persistently stable.  Hemoglobin Within normal limits.  She does not appear to have any evidence of acute blood loss anemia.  I think she had a bleeding hemorrhoid that can hopefully resolve the next couple days with some suppositories.  She already has loose stools secondary to her probiotics that she takes.  Will have her follow-up with PCP if not improved in 3 to 4 days. Discussed return precautions weakness with bleeding, large volume bleeding, passing out, pale skin.    Final Clinical Impression(s) / ED Diagnoses Final diagnoses:  Hematochezia    Rx / DC Orders ED Discharge Orders         Ordered    hydrocortisone (ANUSOL-HC) 25 MG suppository  2 times daily        07/17/20 0057           Malakhi Markwood, Corene Cornea, MD 07/17/20 [phone]

## 2020-08-13 ENCOUNTER — Other Ambulatory Visit: Payer: Self-pay | Admitting: Family Medicine

## 2020-10-16 ENCOUNTER — Other Ambulatory Visit: Payer: Self-pay

## 2020-10-17 ENCOUNTER — Encounter: Payer: Self-pay | Admitting: Family Medicine

## 2020-10-17 ENCOUNTER — Ambulatory Visit (INDEPENDENT_AMBULATORY_CARE_PROVIDER_SITE_OTHER): Payer: Medicare HMO | Admitting: Family Medicine

## 2020-10-17 VITALS — BP 124/76 | HR 71 | Temp 98.2°F | Ht 64.0 in | Wt 189.0 lb

## 2020-10-17 DIAGNOSIS — K9049 Malabsorption due to intolerance, not elsewhere classified: Secondary | ICD-10-CM

## 2020-10-17 DIAGNOSIS — E785 Hyperlipidemia, unspecified: Secondary | ICD-10-CM | POA: Diagnosis not present

## 2020-10-17 DIAGNOSIS — L918 Other hypertrophic disorders of the skin: Secondary | ICD-10-CM | POA: Diagnosis not present

## 2020-10-17 NOTE — Progress Notes (Signed)
Patient: Nancy Mccormick MRN: [id number] DOB: Jan 02, 1934 PCP: Orma Flaming, MD     Subjective:  Chief Complaint  Patient presents with  . Referral    Allergy Testing; She says that every time she eats steak, she has diarrhea for a couple of days.   . Diarrhea  . Skin Tag    HPI: The patient is a 87 y.o. female who presents today for a referral for allergy testing. She says that when she eats steak she has diarrhea for a couple of days. Pt is too early for 6 month follow up. She was only supposed to follow up for 3 month lab visit for Cholesterol. She is not fasting today. Also has a skin tag she would like removed.   Every time she eats steak she will have quite bad diarrhea for a day. She can eat other red meats and is fine. She can eat hamburger and beef stew with no issue. She doesn't really eat other red meat. She went to 3 different restaraunts (filet mignon medium rare) and after all 3 meals she had terrible diarrhea. She ate it for dinner and had diarrhea until 5am. ([place] She states butter and greasy/fried foods can bother her. She will not have vomiting or nausea with these incidents. No blood in her stool and no stomach pain. She is fine now. No other foods cause this. She gets no rash or swelling.    She stopped her fish oil. Due for recheck of cholesterol. Taking crestor with out issue. Not fasting today.   Has skin tags on neck that are really bothering her and catching on things. Irritate and can hurt her. Have not grown, do not bleed.   Review of Systems  Constitutional: Negative for chills, fatigue and fever.  Respiratory: Negative for cough, shortness of breath and wheezing.   Gastrointestinal: Negative for abdominal pain, diarrhea and nausea.  Skin:       Skin tags on neck     Allergies Patient is allergic to shellfish allergy, atorvastatin, and livalo [pitavastatin].  Past Medical History Patient  has a past medical history of Arthritis,  Atrial fibrillation ([place]), History of chicken pox, Hyperlipidemia, Hypertension, Osteoporosis, and Stroke ([place]).  Surgical History Patient  has a past surgical history that includes Vaginal hysterectomy; Cataract extraction; Open reduction internal fixation (orif) distal radial fracture (Right, 12/28/2012); and Knee arthroscopy (Right, 07/04/2015).  Family History Pateint's family history includes Anemia in her mother; Diabetes in her sister; Early death in her father; Hearing loss in her mother; Heart attack in her mother; Heart disease in her mother; Hypertension in her father, mother, sister, and son; Stroke (age of onset: 33) in her father; Stroke (age of onset: 35) in her sister; Stroke (age of onset: 4) in her mother.  Social History Patient  reports that she quit smoking about 33 years ago. Her smoking use included cigarettes. She has never used smokeless tobacco. She reports that she does not drink alcohol and does not use drugs.    Objective: Vitals:   10/17/20 1349  BP: 124/76  Pulse: 71  Temp: 98.2 F (36.8 C)  TempSrc: Temporal  SpO2: 96%  Weight: 189 lb (85.7 kg)  Height: 5\' 4"  (1.626 m)    Body mass index is 32.44 kg/m.  Physical Exam Vitals reviewed.  Constitutional:      Appearance: Normal appearance. She is obese.  HENT:     Head: Normocephalic and atraumatic.  Cardiovascular:     Rate and Rhythm:  Normal rate and regular rhythm.     Heart sounds: Normal heart sounds.  Pulmonary:     Effort: Pulmonary effort is normal.     Breath sounds: Normal breath sounds.  Abdominal:     General: Bowel sounds are normal.     Palpations: Abdomen is soft.  Skin:    Comments: 2 skin tags on neck.  #1) left lateral neck #2) posterior midline of neck   Neurological:     General: No focal deficit present.     Mental Status: She is alert and oriented to person, place, and time.  Psychiatric:        Mood and Affect: Mood normal.        Behavior: Behavior normal.       Procedure note Procedure: Skin tag removal Informed consent:  Discussed risks (permanent scarring, infection, pain, bleeding, bruising, redness, and recurrence of the lesion) and benefits of the procedure, as well as the alternatives.  She is aware that skin tags are benign lesions, and their removal is often not considered medically necessary.  Informed consent was obtained verbally.  Anesthesia: none   The area was prepared and draped in a standard fashion. Snip removal was performed.   Antibiotic ointment and a sterile dressing were applied. Used drysol for hemostasis and silver nitrate for lesion on posterior neck. She is on eliquis, hemostasis was achieved.   The patient tolerated procedure well. The patient was instructed on post-op care.   Number of lesions removed:  2     Assessment/plan: 1. Dyslipidemia (high LDL; low HDL) Stopped her fish oil due to intolerance. Will check today to see how her cholesterol is doing. She has eaten today so she will come back fasting.  - Lipid panel  2. Food intolerance Has had diarrhea following 3 episodes of eating steak, but no other red meat. Discussed we can refer to allergist if she would like, but no other intolerance with red meat, no respiratory compromise. She could also avoid eating steak. She decided to just avoid steak instead of see doctor. Will let me know if changes mind.   3. Skin tags -2 removed. Wound care instructions given.     This visit occurred during the SARS-CoV-2 public health emergency.  Safety protocols were in place, including screening questions prior to the visit, additional usage of staff PPE, and extensive cleaning of exam room while observing appropriate contact time as indicated for disinfecting solutions.    Return if symptoms worsen or fail to improve.   Orma Flaming, MD [place]   10/17/2020

## 2020-10-17 NOTE — Patient Instructions (Addendum)
I like plant based and mediterranean diet a lot.   -will check cholesterol today.   Hold pressure for area on neck if bleeds x 20 minutes. If continues to bleed return here or go to urgent care if after hours.   Love seeing you! Aw

## 2020-10-31 ENCOUNTER — Other Ambulatory Visit: Payer: Self-pay

## 2020-10-31 ENCOUNTER — Other Ambulatory Visit (INDEPENDENT_AMBULATORY_CARE_PROVIDER_SITE_OTHER): Payer: Medicare HMO

## 2020-10-31 DIAGNOSIS — E785 Hyperlipidemia, unspecified: Secondary | ICD-10-CM

## 2020-10-31 LAB — LIPID PANEL
Cholesterol: 184 mg/dL (ref 0–200)
HDL: 69.2 mg/dL (ref >39.00)
LDL Cholesterol: 78 mg/dL (ref 0–99)
NonHDL: 114.38
Total CHOL/HDL Ratio: 3
Triglycerides: 181 mg/dL — ABNORMAL HIGH (ref 0.0–149.0)
VLDL: 36.2 mg/dL (ref 0.0–40.0)

## 2020-11-01 NOTE — Progress Notes (Unsigned)
Cardiology Office Note   Date:  11/02/2020   ID:  Nancy Mccormick, DOB 1934/03/07, MRN [id number]  PCP:  Orma Flaming, MD  Cardiologist:   Minus Breeding, MD   Chief Complaint  Patient presents with  . Atrial Fibrillation      History of Present Illness: Nancy Mccormick is a 87 y.o. female who presents for followup of atrial fibrillation she was admitted in Nov 2018 with a small MCA infarct and was in atrial fib.  She was in the hospital in August of last year she had atrial fib with RVR which converted spontaneously to NSR.    Since I last saw her she denies any cardiovascular symptoms.  The patient denies any new symptoms such as chest discomfort, neck or arm discomfort. There has been no new shortness of breath, PND or orthopnea. There have been no reported palpitations, presyncope or syncope.  Line dancing because of Covid but she lives in assisted living and she does a lot of walking without symptoms.  Past Medical History:  Diagnosis Date  . Arthritis    back, left shoulder, and fingers  . Atrial fibrillation ([place])   . History of chicken pox   . Hyperlipidemia   . Hypertension   . Osteoporosis   . Stroke [facility])     Past Surgical History:  Procedure Laterality Date  . CATARACT EXTRACTION    . KNEE ARTHROSCOPY Right 07/04/2015   Procedure: RIGHT KNEE ARTHROSCOPY WITH MEDIAL MENISCAL DEBRIDEMENT AND CHONDROPLASTY;  Surgeon: Gaynelle Arabian, MD;  Location: [facility];  Service: Orthopedics;  Laterality: Right;  . OPEN REDUCTION INTERNAL FIXATION (ORIF) DISTAL RADIAL FRACTURE Right 12/28/2012   Procedure: OPEN REDUCTION INTERNAL FIXATION (ORIF) DISTAL RADIAL FRACTURE;  Surgeon: Hessie Dibble, MD;  Location: [place];  Service: Orthopedics;  Laterality: Right;  Marland Kitchen VAGINAL HYSTERECTOMY       Current Outpatient Medications  Medication Sig Dispense Refill  . acetaminophen (TYLENOL) 500 MG tablet Take 1,000 mg by mouth every 6 (six) hours as needed for mild  pain or moderate pain.    . Calcium Carbonate-Vitamin D (CALCIUM + D PO) Take 1 tablet by mouth at bedtime.    Rolena Infante Sagrada 450 MG CAPS Take by mouth.    . Chlorpheniramine-DM (CORICIDIN HBP COUGH/COLD PO) Take by mouth.    . diltiazem (CARDIZEM CD) 240 MG 24 hr capsule TAKE 1 CAPSULE(240 MG) BY MOUTH DAILY 90 capsule 3  . ELIQUIS 5 MG TABS tablet TAKE 1 TABLET(5 MG) BY MOUTH TWICE DAILY 180 tablet 1  . hydrocortisone (ANUSOL-HC) 25 MG suppository Place 1 suppository (25 mg total) rectally 2 (two) times daily. For 7 days 14 suppository 0  . Multiple Vitamin (MULTIVITAMIN WITH MINERALS) TABS tablet Take 1 tablet by mouth at bedtime.    . nitroGLYCERIN (NITROSTAT) 0.4 MG SL tablet Place 1 tablet (0.4 mg total) under the tongue as needed. 25 tablet 11  . Psyllium 500 MG CAPS Take 2 capsules by mouth daily.    . rosuvastatin (CRESTOR) 5 MG tablet Take 1 tablet (5 mg total) by mouth daily. 90 tablet 3   No current facility-administered medications for this visit.    Allergies:   Shellfish allergy, Atorvastatin, and Livalo [pitavastatin]    ROS:  Please see the history of present illness.   Otherwise, review of systems are positive for none.   All other systems are reviewed and negative.    PHYSICAL EXAM: VS:  BP 134/74   Pulse 70  Ht 5\' 4"  (1.626 m)   Wt 190 lb 3.2 oz (86.3 kg)   SpO2 93%   BMI 32.65 kg/m  , BMI Body mass index is 32.65 kg/m. GENERAL:  Well appearing NECK:  No jugular venous distention, waveform within normal limits, carotid upstroke brisk and symmetric, no bruits, no thyromegaly LUNGS:  Clear to auscultation bilaterally CHEST:  Unremarkable HEART:  PMI not displaced or sustained,S1 and S2 within normal limits, no S3, no S4, no clicks, no rubs, no murmurs ABD:  Flat, positive bowel sounds normal in frequency in pitch, no bruits, no rebound, no guarding, no midline pulsatile mass, no hepatomegaly, no splenomegaly EXT:  2 plus pulses throughout, no edema, no  cyanosis no clubbing   EKG:  EKG is ordered today. The ekg ordered today demonstrates sinus rhythm, rate 70, premature atrial contractions, no acute ST-T wave changes.   Recent Labs: 07/16/2020: ALT 24; BUN 15; Creatinine, Ser 0.67; Hemoglobin 13.3; Platelets 362; Potassium 3.8; Sodium 142    Lipid Panel    Component Value Date/Time   CHOL 184 10/31/2020 0752   CHOL 203 (H) 03/28/2019 0936   CHOL 129 03/31/2013 0826   TRIG 181.0 (H) 10/31/2020 0752   TRIG 147 04/22/2016 1232   TRIG 87 03/31/2013 0826   HDL 69.20 10/31/2020 0752   HDL 78 03/28/2019 0936   HDL 69 04/22/2016 1232   HDL 56 03/31/2013 0826   CHOLHDL 3 10/31/2020 0752   VLDL 36.2 10/31/2020 0752   LDLCALC 78 10/31/2020 0752   LDLCALC 93 03/28/2019 0936   LDLCALC 135 (H) 06/15/2014 1247   LDLCALC 56 03/31/2013 0826      Wt Readings from Last 3 Encounters:  11/02/20 190 lb 3.2 oz (86.3 kg)  10/17/20 189 lb (85.7 kg)  07/16/20 185 lb (83.9 kg)      Other studies Reviewed: Additional studies/ records that were reviewed today include: None. Review of the above records demonstrates:  Please see elsewhere in the note.     ASSESSMENT AND PLAN:  ATRIAL FIBRILLATION:      She has no symptomatic paroxysms.  She tolerates anticoagulation.  She is up-to-date with blood work.  No change in therapy.  HTN:   The blood pressure is at target.  No change in therapy.   DYSLIPIDEMIA:    LDL is 78 with an HDL of 69.  No change in therapy.    Current medicines are reviewed at length with the patient today.  The patient does not have concerns regarding medicines.  The following changes have been made:  no change  Labs/ tests ordered today include: None  Orders Placed This Encounter  Procedures  . EKG 12-Lead     Disposition:   FU with me in 12 months.     Signed, Minus Breeding, MD  11/02/2020 10:34 AM    [facility]

## 2020-11-02 ENCOUNTER — Ambulatory Visit: Payer: Medicare HMO | Admitting: Cardiology

## 2020-11-02 ENCOUNTER — Other Ambulatory Visit: Payer: Self-pay

## 2020-11-02 ENCOUNTER — Encounter: Payer: Self-pay | Admitting: Cardiology

## 2020-11-02 VITALS — BP 134/74 | HR 70 | Ht 64.0 in | Wt 190.2 lb

## 2020-11-02 DIAGNOSIS — E785 Hyperlipidemia, unspecified: Secondary | ICD-10-CM

## 2020-11-02 DIAGNOSIS — I10 Essential (primary) hypertension: Secondary | ICD-10-CM

## 2020-11-02 DIAGNOSIS — I480 Paroxysmal atrial fibrillation: Secondary | ICD-10-CM

## 2020-11-02 NOTE — Patient Instructions (Signed)
Medication Instructions:  No changes *If you need a refill on your cardiac medications before your next appointment, please call your pharmacy*  Follow-Up: At [facility], you and your health needs are our priority.  As part of our continuing mission to provide you with exceptional heart care, we have created designated Provider Care Teams.  These Care Teams include your primary Cardiologist (physician) and Advanced Practice Providers (APPs -  Physician Assistants and Nurse Practitioners) who all work together to provide you with the care you need, when you need it.  Your next appointment:   12 month(s) You will receive a reminder letter in the mail two months in advance. If you don't receive a letter, please call our office to schedule the follow-up appointment.  The format for your next appointment:   In Person  Provider:   James Hochrein, MD   

## 2020-11-13 ENCOUNTER — Other Ambulatory Visit: Payer: Self-pay | Admitting: Family Medicine

## 2020-11-13 DIAGNOSIS — Z1231 Encounter for screening mammogram for malignant neoplasm of breast: Secondary | ICD-10-CM

## 2020-12-17 ENCOUNTER — Telehealth: Payer: Self-pay | Admitting: Family Medicine

## 2020-12-17 ENCOUNTER — Ambulatory Visit (INDEPENDENT_AMBULATORY_CARE_PROVIDER_SITE_OTHER): Payer: Medicare HMO

## 2020-12-17 ENCOUNTER — Other Ambulatory Visit: Payer: Self-pay

## 2020-12-17 VITALS — BP 136/70 | HR 59 | Temp 97.6°F | Wt 191.6 lb

## 2020-12-17 DIAGNOSIS — Z0000 Encounter for general adult medical examination without abnormal findings: Secondary | ICD-10-CM

## 2020-12-17 NOTE — Telephone Encounter (Signed)
Please let her know that im actually not familiar with cascara sagrada. I know it's a natural herb that is supposed to help stimulate bowels, like a laxative I guess? I have no idea if there is long term effects. She can always stop it and see how her BM go. I have never heard of it, but that doesn't always mean anything!   Dr. Rogers Blocker

## 2020-12-17 NOTE — Patient Instructions (Signed)
[name] , Thank you for taking time to come for your Medicare Wellness Visit. I appreciate your ongoing commitment to your health goals. Please review the following plan we discussed and let me know if I can assist you in the future.   Screening recommendations/referrals: Colonoscopy: No longer required Mammogram: Done 09/15/19 Bone Density: Done 11/17/16 Recommended yearly ophthalmology/optometry visit for glaucoma screening and checkup Recommended yearly dental visit for hygiene and checkup  Vaccinations: Influenza vaccine: Declined  Pneumococcal vaccine: Up to date Tdap vaccine: Up to date Shingles vaccine: Shingrix discussed. Please contact your pharmacy for coverage information.    Covid-19:Completed 1/20 & 11/16/19& 08/14/20  Advanced directives: Please bring a copy of your health care power of attorney and living will to the office at your convenience.  Conditions/risks identified: Lose weight   Next appointment: Follow up in one year for your annual wellness visit    Preventive Care 65 Years and Older, Female Preventive care refers to lifestyle choices and visits with your health care provider that can promote health and wellness. What does preventive care include?  A yearly physical exam. This is also called an annual well check.  Dental exams once or twice a year.  Routine eye exams. Ask your health care provider how often you should have your eyes checked.  Personal lifestyle choices, including:  Daily care of your teeth and gums.  Regular physical activity.  Eating a healthy diet.  Avoiding tobacco and drug use.  Limiting alcohol use.  Practicing safe sex.  Taking low-dose aspirin every day.  Taking vitamin and mineral supplements as recommended by your health care provider. What happens during an annual well check? The services and screenings done by your health care provider during your annual well check will depend on your age, overall health,  lifestyle risk factors, and family history of disease. Counseling  Your health care provider may ask you questions about your:  Alcohol use.  Tobacco use.  Drug use.  Emotional well-being.  Home and relationship well-being.  Sexual activity.  Eating habits.  History of falls.  Memory and ability to understand (cognition).  Work and work Statistician.  Reproductive health. Screening  You may have the following tests or measurements:  Height, weight, and BMI.  Blood pressure.  Lipid and cholesterol levels. These may be checked every 5 years, or more frequently if you are over 61 years old.  Skin check.  Lung cancer screening. You may have this screening every year starting at age 71 if you have a 30-pack-year history of smoking and currently smoke or have quit within the past 15 years.  Fecal occult blood test (FOBT) of the stool. You may have this test every year starting at age 10.  Flexible sigmoidoscopy or colonoscopy. You may have a sigmoidoscopy every 5 years or a colonoscopy every 10 years starting at age 22.  Hepatitis C blood test.  Hepatitis B blood test.  Sexually transmitted disease (STD) testing.  Diabetes screening. This is done by checking your blood sugar (glucose) after you have not eaten for a while (fasting). You may have this done every 1-3 years.  Bone density scan. This is done to screen for osteoporosis. You may have this done starting at age 28.  Mammogram. This may be done every 1-2 years. Talk to your health care provider about how often you should have regular mammograms. Talk with your health care provider about your test results, treatment options, and if necessary, the need for more tests. Vaccines  Your health care provider may recommend certain vaccines, such as:  Influenza vaccine. This is recommended every year.  Tetanus, diphtheria, and acellular pertussis (Tdap, Td) vaccine. You may need a Td booster every 10 years.  Zoster  vaccine. You may need this after age 66.  Pneumococcal 13-valent conjugate (PCV13) vaccine. One dose is recommended after age 21.  Pneumococcal polysaccharide (PPSV23) vaccine. One dose is recommended after age 70. Talk to your health care provider about which screenings and vaccines you need and how often you need them. This information is not intended to replace advice given to you by your health care provider. Make sure you discuss any questions you have with your health care provider. Document Released: 10/19/2015 Document Revised: 06/11/2016 Document Reviewed: 07/24/2015 Elsevier Interactive Patient Education  2017 [place] Prevention in the Home Falls can cause injuries. They can happen to people of all ages. There are many things you can do to make your home safe and to help prevent falls. What can I do on the outside of my home?  Regularly fix the edges of walkways and driveways and fix any cracks.  Remove anything that might make you trip as you walk through a door, such as a raised step or threshold.  Trim any bushes or trees on the path to your home.  Use bright outdoor lighting.  Clear any walking paths of anything that might make someone trip, such as rocks or tools.  Regularly check to see if handrails are loose or broken. Make sure that both sides of any steps have handrails.  Any raised decks and porches should have guardrails on the edges.  Have any leaves, snow, or ice cleared regularly.  Use sand or salt on walking paths during winter.  Clean up any spills in your garage right away. This includes oil or grease spills. What can I do in the bathroom?  Use night lights.  Install grab bars by the toilet and in the tub and shower. Do not use towel bars as grab bars.  Use non-skid mats or decals in the tub or shower.  If you need to sit down in the shower, use a plastic, non-slip stool.  Keep the floor dry. Clean up any water that spills on the  floor as soon as it happens.  Remove soap buildup in the tub or shower regularly.  Attach bath mats securely with double-sided non-slip rug tape.  Do not have throw rugs and other things on the floor that can make you trip. What can I do in the bedroom?  Use night lights.  Make sure that you have a light by your bed that is easy to reach.  Do not use any sheets or blankets that are too big for your bed. They should not hang down onto the floor.  Have a firm chair that has side arms. You can use this for support while you get dressed.  Do not have throw rugs and other things on the floor that can make you trip. What can I do in the kitchen?  Clean up any spills right away.  Avoid walking on wet floors.  Keep items that you use a lot in easy-to-reach places.  If you need to reach something above you, use a strong step stool that has a grab bar.  Keep electrical cords out of the way.  Do not use floor polish or wax that makes floors slippery. If you must use wax, use non-skid floor wax.  Do  not have throw rugs and other things on the floor that can make you trip. What can I do with my stairs?  Do not leave any items on the stairs.  Make sure that there are handrails on both sides of the stairs and use them. Fix handrails that are broken or loose. Make sure that handrails are as long as the stairways.  Check any carpeting to make sure that it is firmly attached to the stairs. Fix any carpet that is loose or worn.  Avoid having throw rugs at the top or bottom of the stairs. If you do have throw rugs, attach them to the floor with carpet tape.  Make sure that you have a light switch at the top of the stairs and the bottom of the stairs. If you do not have them, ask someone to add them for you. What else can I do to help prevent falls?  Wear shoes that:  Do not have high heels.  Have rubber bottoms.  Are comfortable and fit you well.  Are closed at the toe. Do not wear  sandals.  If you use a stepladder:  Make sure that it is fully opened. Do not climb a closed stepladder.  Make sure that both sides of the stepladder are locked into place.  Ask someone to hold it for you, if possible.  Clearly mark and make sure that you can see:  Any grab bars or handrails.  First and last steps.  Where the edge of each step is.  Use tools that help you move around (mobility aids) if they are needed. These include:  Canes.  Walkers.  Scooters.  Crutches.  Turn on the lights when you go into a dark area. Replace any light bulbs as soon as they burn out.  Set up your furniture so you have a clear path. Avoid moving your furniture around.  If any of your floors are uneven, fix them.  If there are any pets around you, be aware of where they are.  Review your medicines with your doctor. Some medicines can make you feel dizzy. This can increase your chance of falling. Ask your doctor what other things that you can do to help prevent falls. This information is not intended to replace advice given to you by your health care provider. Make sure you discuss any questions you have with your health care provider. Document Released: 07/19/2009 Document Revised: 02/28/2016 Document Reviewed: 10/27/2014 Elsevier Interactive Patient Education  2017 [employer].

## 2020-12-17 NOTE — Telephone Encounter (Signed)
Lvm for pt to call the office back. 

## 2020-12-17 NOTE — Progress Notes (Addendum)
Subjective:   Nancy Mccormick is a 87 y.o. female who presents for Medicare Annual (Subsequent) preventive examination.  Review of Systems     Cardiac Risk Factors include: advanced age (>47men, >32 women);dyslipidemia;hypertension;obesity (BMI >30kg/m2)     Objective:    Today's Vitals   12/17/20 0932 12/17/20 0933  BP:  136/70  Pulse:  (!) 59  Temp:  97.6 F (36.4 C)  SpO2:  98%  Weight:  191 lb 9.6 oz (86.9 kg)  PainSc: 5     Body mass index is 32.89 kg/m.  Advanced Directives 12/17/2020 07/16/2020 11/14/2019 08/26/2018 06/30/2018 06/05/2018 06/05/2018  Does Patient Have a Medical Advance Directive? Yes Yes Yes Yes Yes Yes Yes  Type of Paramedic of [place];Living will Healthcare Power of Attorney Living will;Healthcare Power of [place];Living will [place];Living will [place];Living will [place];Living will  Does patient want to make changes to medical advance directive? - - No - Patient declined No - Patient declined - No - Patient declined -  Copy of [place] in Chart? No - copy requested - No - copy requested Yes - validated most recent copy scanned in chart (See row information) - Yes -  Would patient like information on creating a medical advance directive? - No - Patient declined - - - - -  Pre-existing out of facility DNR order (yellow form or pink MOST form) - - - - - - -    Current Medications (verified) Outpatient Encounter Medications as of 12/17/2020  Medication Sig  . acetaminophen (TYLENOL) 500 MG tablet Take 1,000 mg by mouth every 6 (six) hours as needed for mild pain or moderate pain.  . Calcium Carbonate-Vitamin D (CALCIUM + D PO) Take 1 tablet by mouth at bedtime.  Rolena Inf[place] 450 MG CAPS Take by mouth.  . diltiazem (CARDIZEM CD) 240 MG 24 hr capsule TAKE 1 CAPSULE(240 MG) BY MOUTH DAILY  . ELIQUIS 5 MG TABS tablet  TAKE 1 TABLET(5 MG) BY MOUTH TWICE DAILY  . Multiple Vitamin (MULTIVITAMIN WITH MINERALS) TABS tablet Take 1 tablet by mouth at bedtime.  . Psyllium 500 MG CAPS Take 2 capsules by mouth daily.  . rosuvastatin (CRESTOR) 5 MG tablet Take 1 tablet (5 mg total) by mouth daily.  . Chlorpheniramine-DM (CORICIDIN HBP COUGH/COLD PO) Take by mouth. (Patient not taking: Reported on 12/17/2020)  . nitroGLYCERIN (NITROSTAT) 0.4 MG SL tablet Place 1 tablet (0.4 mg total) under the tongue as needed. (Patient not taking: Reported on 12/17/2020)  . [DISCONTINUED] hydrocortisone (ANUSOL-HC) 25 MG suppository Place 1 suppository (25 mg total) rectally 2 (two) times daily. For 7 days (Patient not taking: Reported on 12/17/2020)   No facility-administered encounter medications on file as of 12/17/2020.    Allergies (verified) Shellfish allergy, Atorvastatin, and Livalo [pitavastatin]   History: Past Medical History:  Diagnosis Date  . Arthritis    back, left shoulder, and fingers  . Atrial fibrillation ([place])   . History of chicken pox   . Hyperlipidemia   . Hypertension   . Osteoporosis   . Stroke [facility])    Past Surgical History:  Procedure Laterality Date  . CATARACT EXTRACTION    . KNEE ARTHROSCOPY Right 07/04/2015   Procedure: RIGHT KNEE ARTHROSCOPY WITH MEDIAL MENISCAL DEBRIDEMENT AND CHONDROPLASTY;  Surgeon: Gaynelle Arabian, MD;  Location: [facility];  Service: Orthopedics;  Laterality: Right;  . OPEN REDUCTION INTERNAL FIXATION (ORIF) DISTAL  RADIAL FRACTURE Right 12/28/2012   Procedure: OPEN REDUCTION INTERNAL FIXATION (ORIF) DISTAL RADIAL FRACTURE;  Surgeon: Hessie Dibble, MD;  Location: [facility];  Service: Orthopedics;  Laterality: Right;  Marland Kitchen VAGINAL HYSTERECTOMY     Family History  Problem Relation Age of Onset  . Stroke Mother 73  . Anemia Mother   . Heart disease Mother   . Hearing loss Mother   . Heart attack Mother   . Hypertension Mother   . Stroke Father 24  . Early death  Father   . Hypertension Father   . Stroke Sister 41  . Diabetes Sister   . Hypertension Sister   . Hypertension Son    Social History   Socioeconomic History  . Marital status: Widowed    Spouse name: Not on file  . Number of children: 3  . Years of education: Not on file  . Highest education level: Not on file  Occupational History  . 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 Smoking status: Former Smoker    Types: Cigarettes    Quit date: 12/17/1986    Years since quitting: 34.0  . Smokeless tobacco: Never Used  . Tobacco comment: Smoke briefly in the distant past  Vaping Use  . Vaping Use: Never used  Substance and Sexual Activity  . Alcohol use: No  . Drug use: No  . Sexual activity: Not Currently  Other Topics Concern  . Not on file  Social History Narrative   Nancy Mccormick lives at home alone since her husband passed in April 2019. She has three sons and three grandsons.None live locally but she gets to talk with them often and sees them pretty regularly. 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  She enjoys having lunch out with friends regularly. She plans to move to a   Social Determinants of Health   Financial Resource Strain: Low Risk   . Difficulty of Paying Living Expenses: Not hard at all  Food Insecurity: No Food Insecurity  . Worried About Charity fundraiser in the Last Year: Never true  . Ran Out of Food in the Last Year: Never true  Transportation Needs: No Transportation Needs  . Lack of Transportation (Medical): No  . Lack of Transportation (Non-Medical): No  Physical Activity: Insufficiently Active  . Days of Exercise per Week: 3 days  . Minutes of Exercise per Session: 30 min  Stress: No Stress Concern Present  .  Feeling of Stress : Not at all  Social Connections: Moderately Integrated  . Frequency of Communication with Friends and Family: More than three times a week  . Frequency of Social Gatherings with Friends and Family: More than three times a week  . Attends Religious Services: More than 4 times per year  . Active Member of Clubs or Organizations: Yes  . Attends Archivist Meetings: 1 to 4 times per year  . Marital Status: Widowed    Tobacco Counseling Counseling given: Not Answered Comment: Smoke briefly in the distant past   Clinical Intake:  Pre-visit preparation completed: Yes  Pain : 0-10 Pain Score: 5  Pain Type: Chronic pain Pain Location: Arm Pain Orientation: Left Pain Descriptors / Indicators: Aching Pain  Onset: More than a month ago Pain Frequency: Constant     BMI - recorded: 32.89 Nutritional Status: BMI > 30  Obese Nutritional Risks: None Diabetes: No  How often do you need to have someone help you when you read instructions, pamphlets, or other written materials from your doctor or pharmacy?: 1 - Never  Diabetic?No  Interpreter Needed?: No  Information entered by :: Charlott Rakes, LPN   Activities of Daily Living In your present state of health, do you have any difficulty performing the following activities: 12/17/2020  Hearing? Y  Comment mild loss  Vision? N  Difficulty concentrating or making decisions? Y  Comment at times  Walking or climbing stairs? Y  Comment related to knee  Dressing or bathing? N  Doing errands, shopping? N  Preparing Food and eating ? N  Using the Toilet? N  In the past six months, have you accidently leaked urine? Y  Comment at times will wear pads at times  Do you have problems with loss of bowel control? N  Managing your Medications? N  Managing your Finances? N  Housekeeping or managing your Housekeeping? N  Some recent data might be hidden    Patient Care Team: Orma Flaming, MD as PCP - General  (Family Medicine) Minus Breeding, MD as PCP - Cardiology (Cardiology) Gaynelle Arabian, MD as Consulting Physician (Orthopedic Surgery) Minus Breeding, MD as Consulting Physician (Cardiology)  Indicate any recent Medical Services you may have received from other than [facility] providers in the past year (date may be approximate).     Assessment:   This is a routine wellness examination for [place].  Hearing/Vision screen  Hearing Screening   125Hz  250Hz  500Hz  1000Hz  2000Hz  3000Hz  4000Hz  6000Hz  8000Hz   Right ear:           Left ear:           Comments: Pt states mild loss   Vision Screening Comments: Pt follow sup with Dr Katy Fitch for annual eye exams   Dietary issues and exercise activities discussed: Current Exercise Habits: Home exercise routine, Type of exercise: walking, Time (Minutes): 30, Frequency (Times/Week): 3, Weekly Exercise (Minutes/Week): 90  Goals    . Exercise 150 min/wk Moderate Activity    . Patient Stated     Lose weight       Depression Screen PHQ 2/9 Scores 12/17/2020 07/12/2020 11/14/2019 07/11/2019 08/26/2018 08/18/2018 07/14/2018  PHQ - 2 Score 0 0 0 0 0 0 0  PHQ- 9 Score - - - - - - -    Fall Risk Fall Risk  12/17/2020 07/12/2020 05/24/2020 11/14/2019 11/14/2019  Falls in the past year? 0 0 0 0 0  Comment - - - - -  Number falls in past yr: 0 - - 0 -  Injury with Fall? 0 - - 0 -  Comment - - - - -  Risk for fall due to : Impaired vision;Impaired balance/gait No Fall Risks - - -  Follow up Falls prevention discussed - - Falls evaluation completed;Education provided;Falls prevention discussed -    FALL RISK PREVENTION PERTAINING TO THE HOME:  Any stairs in or around the home? No  If so, are there any without handrails? No  Home free of loose throw rugs in walkways, pet beds, electrical cords, etc? Yes  Adequate lighting in your home to reduce risk of falls? Yes   ASSISTIVE DEVICES UTILIZED TO PREVENT FALLS:  Life alert? Yes  call bell in home  Use of a cane,  walker or w/c? No  Grab bars in the bathroom? Yes  Shower chair or bench in shower? Yes  Elevated toilet seat or a handicapped toilet? No   TIMED UP AND GO:  Was the test performed? Yes .  Length of time to ambulate 10 feet: 15 sec.   Gait steady and fast without use of assistive device  Cognitive Function: MMSE - Mini Mental State Exam 08/31/2017 08/25/2017 02/20/2015  Not completed: - - Refused  Orientation to time 5 5 4   Orientation to Place 5 5 5   Registration 3 3 3   Attention/ Calculation 5 5 5   Recall 3 3 3   Language- name 2 objects 1 2 2   Language- repeat 1 1 1   Language- follow 3 step command 3 3 3   Language- read & follow direction 1 1 1   Write a sentence 1 1 1   Copy design 1 1 1   Total score 29 30 29      6CIT Screen 12/17/2020 11/14/2019 08/26/2018  What Year? 0 points 0 points 0 points  What month? 0 points 0 points 0 points  What time? - 0 points 0 points  Count back from 20 0 points 0 points 0 points  Months in reverse 0 points 0 points 0 points  Repeat phrase 0 points 0 points 0 points  Total Score - 0 0    Immunizations Immunization History  Administered Date(s) Administered  . PFIZER(Purple Top)SARS-COV-2 Vaccination 10/26/2019, 11/16/2019, 08/14/2020  . Pneumococcal Conjugate-13 12/08/2013  . Pneumococcal Polysaccharide-23 11/11/2016  . Td 06/02/2011  . Tdap 06/02/2011, 08/26/2018  . Zoster 04/15/2012    TDAP status: Up to date  Flu Vaccine status: Declined, Education has been provided regarding the importance of this vaccine but patient still declined. Advised may receive this vaccine at local pharmacy or Health Dept. Aware to provide a copy of the vaccination record if obtained from local pharmacy or Health Dept. Verbalized acceptance and understanding.  Pneumococcal vaccine status: Up to date  Covid-19 vaccine status: Completed vaccines  Qualifies for Shingles Vaccine? Yes   Zostavax completed Yes   Shingrix Completed?: No.    Education has  been provided regarding the importance of this vaccine. Patient has been advised to call insurance company to determine out of pocket expense if they have not yet received this vaccine. Advised may also receive vaccine at local pharmacy or Health Dept. Verbalized acceptance and understanding.  Screening Tests Health Maintenance  Topic Date Due  . MAMMOGRAM  09/14/2020  . COVID-19 Vaccine (4 - Booster for Pfizer series) 02/11/2021  . TETANUS/TDAP  08/26/2028  . DEXA SCAN  Completed  . PNA vac Low Risk Adult  Completed  . HPV VACCINES  Aged Out  . INFLUENZA VACCINE  Discontinued    Health Maintenance  Health Maintenance Due  Topic Date Due  . MAMMOGRAM  09/14/2020    Colorectal cancer screening: No longer required.   Mammogram status: Completed 12/810/20. Repeat every year  Bone Density status: Completed 11/17/16. Results reflect: Bone density results: OSTEOPENIA. Repeat every 2 years.   Additional Screening:   Vision Screening: Recommended annual ophthalmology exams for early detection of glaucoma and other disorders of the eye. Is the patient up to date with their annual eye exam?  Yes  Who is the provider or what is the name of the office in which the patient attends annual eye exams? Dr Katy Fitch  If pt is not established with a provider, would they like to be referred to a provider to  establish care? No .   Dental Screening: Recommended annual dental exams for proper oral hygiene  Community Resource Referral / Chronic Care Management: CRR required this visit?  No   CCM required this visit?  No      Plan:     I have personally reviewed and noted the following in the patient's chart:   . Medical and social history . Use of alcohol, tobacco or illicit drugs  . Current medications and supplements . Functional ability and status . Nutritional status . Physical activity . Advanced directives . List of other physicians . Hospitalizations, surgeries, and ER visits in  previous 12 months . Vitals . Screenings to include cognitive, depression, and falls . Referrals and appointments  In addition, I have reviewed and discussed with patient certain preventive protocols, quality metrics, and best practice recommendations. A written personalized care plan for preventive services as well as general preventive health recommendations were provided to patient.     Willette Brace, LPN   [id number]   Nurse Notes: Pt wanted to know if she should continue taking C[place] related to she has been taking it  for over 10 years and is concerned about it effecting her liver. Also if you felt or would prescribe something different? please advise

## 2020-12-18 NOTE — Telephone Encounter (Signed)
Called pt to give message below. Pt voiced understanding.

## 2020-12-18 NOTE — Telephone Encounter (Signed)
Patient calling back to speak to [facility].

## 2021-01-02 ENCOUNTER — Ambulatory Visit: Payer: Medicare HMO

## 2021-01-14 ENCOUNTER — Other Ambulatory Visit: Payer: Self-pay | Admitting: Family Medicine

## 2021-02-21 ENCOUNTER — Ambulatory Visit
Admission: RE | Admit: 2021-02-21 | Discharge: 2021-02-21 | Disposition: A | Discharge status: Home / Self Care | Payer: Medicare HMO | Source: Ambulatory Visit | Attending: Family Medicine | Admitting: Family Medicine

## 2021-02-21 ENCOUNTER — Other Ambulatory Visit: Payer: Self-pay

## 2021-02-21 DIAGNOSIS — Z1231 Encounter for screening mammogram for malignant neoplasm of breast: Secondary | ICD-10-CM

## 2021-05-07 ENCOUNTER — Ambulatory Visit (INDEPENDENT_AMBULATORY_CARE_PROVIDER_SITE_OTHER): Payer: Medicare HMO | Admitting: Family Medicine

## 2021-05-07 ENCOUNTER — Other Ambulatory Visit: Payer: Self-pay

## 2021-05-07 ENCOUNTER — Encounter: Payer: Self-pay | Admitting: Family Medicine

## 2021-05-07 VITALS — BP 137/73 | HR 72 | Temp 96.0°F | Ht 64.0 in | Wt 188.8 lb

## 2021-05-07 DIAGNOSIS — M179 Osteoarthritis of knee, unspecified: Secondary | ICD-10-CM

## 2021-05-07 DIAGNOSIS — K5900 Constipation, unspecified: Secondary | ICD-10-CM | POA: Insufficient documentation

## 2021-05-07 DIAGNOSIS — E785 Hyperlipidemia, unspecified: Secondary | ICD-10-CM

## 2021-05-07 DIAGNOSIS — I4891 Unspecified atrial fibrillation: Secondary | ICD-10-CM

## 2021-05-07 DIAGNOSIS — L723 Sebaceous cyst: Secondary | ICD-10-CM | POA: Diagnosis not present

## 2021-05-07 DIAGNOSIS — R229 Localized swelling, mass and lump, unspecified: Secondary | ICD-10-CM | POA: Diagnosis not present

## 2021-05-07 DIAGNOSIS — I10 Essential (primary) hypertension: Secondary | ICD-10-CM | POA: Diagnosis not present

## 2021-05-07 DIAGNOSIS — M1710 Unilateral primary osteoarthritis, unspecified knee: Secondary | ICD-10-CM

## 2021-05-07 DIAGNOSIS — L720 Epidermal cyst: Secondary | ICD-10-CM

## 2021-05-07 NOTE — Patient Instructions (Signed)
It was very nice to see you today!  No changes today.  Please let me know if the nodule in your leg does not improve over the next several weeks and we can check an ultrasound.  They extracted the cyst on your face today.  Please let us know if you notice any signs of infection.  I will see you back in 6 months.  Please come back to see me sooner if needed.  Take care, Dr Jerline Pain  PLEASE NOTE:  If you had any lab tests please let us know if you have not heard back within a few days. You may see your results on mychart before we have a chance to review them but we will give you a call once they are reviewed by [place]. If we ordered any referrals today, please let us know if you have not heard from their office within the next week.   Please try these tips to maintain a healthy lifestyle:  Eat at least 3 REAL meals and 1-2 snacks per day.  Aim for no more than 5 hours between eating.  If you eat breakfast, please do so within one hour of getting up.   Each meal should contain half fruits/vegetables, one quarter protein, and one quarter carbs (no bigger than a computer mouse)  Cut down on sweet beverages. This includes juice, soda, and sweet tea.   Drink at least 1 glass of water with each meal and aim for at least 8 glasses per day  Exercise at least 150 minutes every week.

## 2021-05-07 NOTE — Progress Notes (Signed)
Nancy Mccormick is a 87 y.o. female who presents today for an office visit.  Assessment/Plan:  New/Acute Problems: Inflamed sebaceous cyst I&D performed today.  See below procedure.  She tolerated well.  Skin nodule We will continue with watchful waiting.  Possibly hematoma.  If not resolving over the next several weeks would consider ultrasound.  Chronic Problems Addressed Today: Essential hypertension At goal on diltiazem to 40 mg daily.  Dyslipidemia (high LDL; low HDL) On Crestor 5 mg daily.  We can check lipids with her next blood draw when she comes back in about 6 months.  Osteoarthritis of knee  follows with orthopedics.  Discussed weight loss strategies.    Atrial fibrillation ([place]) Rate controlled today.  She is on Cardizem to 40 mg daily and Eliquis 5 mg twice daily per cardiology.  Constipation Continue over-the-counter bowel regimen.     Subjective:  HPI:  She is here to transfer of care from Dr. Orma Flaming. Her previous PCP retired and other no longer work at this hospital.  She complain of knot on her leg that came about 2 weeks ago. She states it does not hurt at all.    She also complain of cyst in her lip that bothers her sometimes.She states it appeared about a month ago. In addition to this, she have pain in her knees. She is willing to cut down on carb and sugar. No fever, chill or nausea.  She is currently taking Cascara Sagrada for constipation. Patient has a PMHx of arthritis, atrial fibrillation, chicken pox, hyperlipidemia, hypertension, Osteoporosis, and Stroke. No symptoms for Atrial fibrillation since she moved to [place] about 2 years ago. She have one episode of stroke few years ago. Tolerating her HTN medication well.   ROS: Per HPI, otherwise a complete review of systems was negative.   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   Osteoarthritis of knee 01/25/2019   Atrial fibrillation ([place]) 06/05/2018   History of CVA (cerebrovascular accident) 08/30/2017   Tear of lateral meniscus of knee 07/03/2015   Osteopenia 04/19/2014   Arrhythmia 11/04/2013   Dyslipidemia (high LDL; low HDL) 09/10/2010   Essential hypertension 09/10/2010   Past Surgical History:  Procedure Laterality Date   CATARACT EXTRACTION     KNEE ARTHROSCOPY Right 07/04/2015   Procedure: RIGHT KNEE ARTHROSCOPY WITH MEDIAL MENISCAL DEBRIDEMENT AND CHONDROPLASTY;  Surgeon: Gaynelle Arabian, MD;  Location: [facility];  Service: Orthopedics;  Laterality: Right;   OPEN REDUCTION INTERNAL FIXATION (ORIF) DISTAL RADIAL FRACTURE Right 12/28/2012   Procedure: OPEN REDUCTION INTERNAL FIXATION (ORIF) DISTAL RADIAL FRACTURE;  Surgeon: Hessie Dibble, MD;  Location: [place];  Service: Orthopedics;  Laterality: Right;   VAGINAL HYSTERECTOMY      Family History  Problem Relation Age of Onset   Stroke Mother 5   Anemia Mother    Heart disease Mother    Hearing loss Mother    Heart attack Mother    Hypertension Mother    Stroke Father 58   Early death Father    Hypertension  Father    Stroke Sister 33   Diabetes Sister    Hypertension Sister    Hypertension Son     Medications- reviewed and updated Current Outpatient Medications  Medication Sig Dispense Refill   acetaminophen (TYLENOL) 500 MG tablet Take 1,000 mg by mouth every 6 (six) hours as needed for mild pain or moderate pain.     Calcium Carbonate-Vitamin D (CALCIUM + D PO) Take 1 tablet by mouth at bedtime.     Cascara Sagrada 450 MG CAPS Take by mouth.     Chlorpheniramine-DM (CORICIDIN HBP COUGH/COLD PO)  Take by mouth.     diltiazem (CARDIZEM CD) 240 MG 24 hr capsule TAKE 1 CAPSULE(240 MG) BY MOUTH DAILY 90 capsule 3   ELIQUIS 5 MG TABS tablet TAKE 1 TABLET(5 MG) BY MOUTH TWICE DAILY 180 tablet 1   Multiple Vitamin (MULTIVITAMIN WITH MINERALS) TABS tablet Take 1 tablet by mouth at bedtime.     nitroGLYCERIN (NITROSTAT) 0.4 MG SL tablet Place 1 tablet (0.4 mg total) under the tongue as needed. 25 tablet 11   Psyllium 500 MG CAPS Take 2 capsules by mouth daily.     rosuvastatin (CRESTOR) 5 MG tablet Take 1 tablet (5 mg total) by mouth daily. 90 tablet 3   No current facility-administered medications for this visit.    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 She has three sons and three grandsons.None live locally but she gets to talk with them often and sees them pretty regularly. 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  She enjoys having lunch out with friends regularly. She plans to move to a   Social Determinants of Radio broadcast assistant Strain: Low Risk    Difficulty of Paying Living Expenses: Not hard at all  Food Insecurity: No Food Insecurity   Worried About Charity fundraiser in the Last Year: Never true   Arboriculturist in the Last Year: Never true  Transportation Needs: No Transportation Needs   Lack of Transportation (Medical): No   Lack of Transportation (Non-Medical): No  Physical Activity: Insufficiently Active   Days of Exercise per Week:  3 days   Minutes of Exercise per Session: 30 min  Stress: No Stress Concern Present   Feeling of Stress : Not at all  Social Connections: Moderately Integrated   Frequency of Communication with Friends and Family: More than three times a week   Frequency of Social Gatherings with Friends and Family: More than three times a week   Attends Religious Services: More than 4 times per year   Active Member of [employer] or Organizations: Yes   Attends Archivist Meetings: 1 to 4 times per year   Marital Status: Widowed          Objective:  Physical Exam: BP 137/73   Pulse 72   Temp (!) 96 F (35.6 C) (Temporal)   Ht '5\' 4"'$  (1.626 m)   Wt 188 lb 12.8 oz (85.6 kg)   SpO2 95%   BMI 32.41 kg/m   Gen: No acute distress, resting comfortably CV: Regular rate and rhythm with no murmurs appreciated Pulm: Normal work of breathing, clear to auscultation bilaterally with no crackles, wheezes, or rhonchi Skin: Small 2 to 3 cm nodule on right anterior shin.  Nonpainful on palpation.  Also with a 2 to 3 mm cystic not Neuro: Grossly normal, moves all extremities Psych: Normal affect and thought content  Sebaceous Cyst I&D with Excision Procedure Note  Pre-operative Diagnosis: Inflamed cyst  Post-operative Diagnosis: Same  Locations: Right lower lip  Indications: Diagnostic and  therapeutic  Anesthesia: Lidocaine 1% w  Procedure Details  History of allergy to iodine: No  Patient informed of the risks (including bleeding and infection) and benefits of the  procedure and Written informed consent obtained.  The lesion and surrounding area was given a sterile prep using betadyne and draped in the usual sterile fashion.  A 11 blade scalpel was used to penetrate the lesion.  Typical sebaceous material was then expressed from the incision site.  The cyst wall was successfully extracted from the incision.    Antibiotic ointment and a sterile dressing applied.  The specimen was sent for pathologic examination. The patient tolerated the procedure well.  EBL: 1 ml  Findings: N/A  Condition: Stable  Complications: none.  Plan: 1. Instructed to keep the wound dry and covered for 24-48h and clean thereafter. 2. Warning signs of infection were reviewed.   3. Recommended that the patient use OTC analgesics as needed for pain.        I,Savera Zaman,acting as a Education administrator for Dimas Chyle, MD.,have documented all relevant documentation on the behalf of Dimas Chyle, MD,as directed by  Dimas Chyle, MD while in the presence of Dimas Chyle, MD.   I, Dimas Chyle, MD, have reviewed all documentation for this visit. The documentation on 05/07/21 for the exam, diagnosis, procedures, and orders are all accurate and complete.   Time Spent: 45 minutes of total time was spent on the date of the encounter performing the following actions: chart review prior to seeing the patient including recent visits with previous PCP and specialists  obtaining history, performing a medically necessary exam, counseling on the treatment plan, placing orders, and documenting in our EHR.  This time does not include time spent performing above procedure.  Algis Greenhouse. Jerline Pain, MD 05/07/2021 2:09 PM

## 2021-05-07 NOTE — Assessment & Plan Note (Signed)
Continue over-the-counter bowel regimen.

## 2021-05-07 NOTE — Assessment & Plan Note (Signed)
At goal on diltiazem to 40 mg daily.

## 2021-05-07 NOTE — Assessment & Plan Note (Signed)
follows with orthopedics.  Discussed weight loss strategies.

## 2021-05-07 NOTE — Assessment & Plan Note (Signed)
Rate controlled today.  She is on Cardizem to 40 mg daily and Eliquis 5 mg twice daily per cardiology.

## 2021-05-07 NOTE — Assessment & Plan Note (Signed)
On Crestor 5 mg daily.  We can check lipids with her next blood draw when she comes back in about 6 months.

## 2021-05-10 ENCOUNTER — Emergency Department (HOSPITAL_BASED_OUTPATIENT_CLINIC_OR_DEPARTMENT_OTHER)
Admission: EM | Admit: 2021-05-10 | Discharge: 2021-05-11 | Disposition: A | Discharge status: Home / Self Care | Payer: Medicare HMO | Attending: Emergency Medicine | Admitting: Emergency Medicine

## 2021-05-10 ENCOUNTER — Other Ambulatory Visit: Payer: Self-pay

## 2021-05-10 ENCOUNTER — Encounter (HOSPITAL_BASED_OUTPATIENT_CLINIC_OR_DEPARTMENT_OTHER): Payer: Self-pay

## 2021-05-10 DIAGNOSIS — R531 Weakness: Secondary | ICD-10-CM | POA: Diagnosis not present

## 2021-05-10 DIAGNOSIS — Z7901 Long term (current) use of anticoagulants: Secondary | ICD-10-CM | POA: Diagnosis not present

## 2021-05-10 DIAGNOSIS — R079 Chest pain, unspecified: Secondary | ICD-10-CM | POA: Insufficient documentation

## 2021-05-10 DIAGNOSIS — R42 Dizziness and giddiness: Secondary | ICD-10-CM | POA: Insufficient documentation

## 2021-05-10 DIAGNOSIS — Z87891 Personal history of nicotine dependence: Secondary | ICD-10-CM | POA: Diagnosis not present

## 2021-05-10 DIAGNOSIS — R0602 Shortness of breath: Secondary | ICD-10-CM | POA: Diagnosis not present

## 2021-05-10 DIAGNOSIS — I4891 Unspecified atrial fibrillation: Secondary | ICD-10-CM | POA: Diagnosis not present

## 2021-05-10 DIAGNOSIS — Z79899 Other long term (current) drug therapy: Secondary | ICD-10-CM | POA: Diagnosis not present

## 2021-05-10 DIAGNOSIS — Z85828 Personal history of other malignant neoplasm of skin: Secondary | ICD-10-CM | POA: Diagnosis not present

## 2021-05-10 DIAGNOSIS — I10 Essential (primary) hypertension: Secondary | ICD-10-CM | POA: Diagnosis not present

## 2021-05-10 DIAGNOSIS — Z79891 Long term (current) use of opiate analgesic: Secondary | ICD-10-CM | POA: Diagnosis not present

## 2021-05-10 LAB — CBC WITH DIFFERENTIAL/PLATELET
Abs Immature Granulocytes: 0.03 10*3/uL (ref 0.00–0.07)
Basophils Absolute: 0.1 10*3/uL (ref 0.0–0.1)
Basophils Relative: 0 %
Eosinophils Absolute: 0.2 10*3/uL (ref 0.0–0.5)
Eosinophils Relative: 2 %
HCT: 39.5 % (ref 36.0–46.0)
Hemoglobin: 13 g/dL (ref 12.0–15.0)
Immature Granulocytes: 0 %
Lymphocytes Relative: 26 %
Lymphs Abs: 3.1 10*3/uL (ref 0.7–4.0)
MCH: 29.5 pg (ref 26.0–34.0)
MCHC: 32.9 g/dL (ref 30.0–36.0)
MCV: 89.8 fL (ref 80.0–100.0)
Monocytes Absolute: 0.9 10*3/uL (ref 0.1–1.0)
Monocytes Relative: 8 %
Neutro Abs: 7.8 10*3/uL — ABNORMAL HIGH (ref 1.7–7.7)
Neutrophils Relative %: 64 %
Platelets: 361 10*3/uL (ref 150–400)
RBC: 4.4 MIL/uL (ref 3.87–5.11)
RDW: 13.7 % (ref 11.5–15.5)
WBC: 12.1 10*3/uL — ABNORMAL HIGH (ref 4.0–10.5)
nRBC: 0 % (ref 0.0–0.2)

## 2021-05-10 LAB — BASIC METABOLIC PANEL
Anion gap: 12 (ref 5–15)
BUN: 14 mg/dL (ref 8–23)
CO2: 23 mmol/L (ref 22–32)
Calcium: 8.9 mg/dL (ref 8.9–10.3)
Chloride: 106 mmol/L (ref 98–111)
Creatinine, Ser: 0.72 mg/dL (ref 0.44–1.00)
GFR, Estimated: >60 mL/min (ref >60)
Glucose, Bld: 131 mg/dL — ABNORMAL HIGH (ref 70–99)
Potassium: 3.3 mmol/L — ABNORMAL LOW (ref 3.5–5.1)
Sodium: 141 mmol/L (ref 135–145)

## 2021-05-10 NOTE — ED Triage Notes (Addendum)
Pt is present for dizziness and weakness that started appx one hour ago while playing cards. Pt also c/o mild SOB with chest "discomfort". Pt states she just tried to shake it off but the feeling wont go away. Hx of Afib and is on eliquis. Pt took her BP at home and it was 180/90. Hx of stroke. Denies HA, difficulty speaking, or stroke like sx. Afebrile.

## 2021-05-10 NOTE — ED Provider Notes (Signed)
[facility] Provider Note   CSN: [id number] Arrival date & time: 05/10/21  1927     History Chief Complaint  Patient presents with   Dizziness   Weakness    Wardah Holsey is a 87 y.o. female.  Patient presents to the emergency department for evaluation of dizziness.  Patient reports that she was sitting and playing cards when she suddenly felt very weak and dizzy.  Symptoms were present for about an hour, did not resolve, so she came to the ER.  She felt some mild shortness of breath and chest discomfort that she can only describe as "weakness".  Patient reports a history of atrial fibrillation and thinks some of the symptoms feel similar to when she goes into atrial fibrillation.  She checked her blood pressure which was elevated, was concerned that the blood pressure was causing the symptoms.  Patient has had a stroke in the past, at that time she mostly had difficulty with speech.  She has not had any difficulty with speech tonight.  She describes the dizziness as simply being off balance.  When she walked that she needed help with walking.  Patient reports that while she was in the waiting room, symptoms have essentially resolved.      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   Osteoarthritis of knee 01/25/2019   Atrial fibrillation ([place]) 06/05/2018   History of CVA (cerebrovascular accident) 08/30/2017   Tear of lateral meniscus of knee 07/03/2015   Osteopenia 04/19/2014   Arrhythmia 11/04/2013   Dyslipidemia (high LDL; low HDL) 09/10/2010   Essential hypertension 09/10/2010    Past Surgical History:  Procedure Laterality Date    CATARACT EXTRACTION     KNEE ARTHROSCOPY Right 07/04/2015   Procedure: RIGHT KNEE ARTHROSCOPY WITH MEDIAL MENISCAL DEBRIDEMENT AND CHONDROPLASTY;  Surgeon: Gaynelle Arabian, MD;  Location: [facility];  Service: Orthopedics;  Laterality: Right;   OPEN REDUCTION INTERNAL FIXATION (ORIF) DISTAL RADIAL FRACTURE Right 12/28/2012   Procedure: OPEN REDUCTION INTERNAL FIXATION (ORIF) DISTAL RADIAL FRACTURE;  Surgeon: Hessie Dibble, MD;  Location: [facility];  Service: Orthopedics;  Laterality: Right;   VAGINAL HYSTERECTOMY       OB History   No obstetric history on file.     Family History  Problem Relation Age of Onset   Stroke Mother 33   Anemia Mother    Heart disease Mother    Hearing loss Mother    Heart attack Mother    Hypertension Mother    Stroke Father 34   Early death Father    Hypertension Father    Stroke Sister 63   Diabetes Sister    Hypertension Sister    Hypertension Son     Social History   Tobacco Use   Smoking status: Former    Types: Cigarettes    Quit date: 12/17/1986    Years since quitting: 34.4   Smokeless tobacco: Never   Tobacco comments:    Smoke briefly in the distant past  Vaping Use   Vaping Use: Never used  Substance Use Topics   Alcohol use: No  Drug use: No    Home Medications Prior to Admission medications   Medication Sig Start Date End Date Taking? Authorizing Provider  acetaminophen (TYLENOL) 500 MG tablet Take 1,000 mg by mouth every 6 (six) hours as needed for mild pain or moderate pain.    [provider]  Calcium Carbonate-Vitamin D (CALCIUM + D PO) Take 1 tablet by mouth at bedtime.    [provider]  Cascara Sagrada 450 MG CAPS Take by mouth.    [provider]  Chlorpheniramine-DM (CORICIDIN HBP COUGH/COLD PO) Take by mouth.    [provider]  diltiazem (CARDIZEM CD) 240 MG 24 hr capsule TAKE 1 CAPSULE(240 MG) BY MOUTH DAILY 08/13/20   Orma Flaming, MD  ELIQUIS 5 MG TABS tablet  TAKE 1 TABLET(5 MG) BY MOUTH TWICE DAILY 01/15/21   Orma Flaming, MD  Multiple Vitamin (MULTIVITAMIN WITH MINERALS) TABS tablet Take 1 tablet by mouth at bedtime.    [provider]  nitroGLYCERIN (NITROSTAT) 0.4 MG SL tablet Place 1 tablet (0.4 mg total) under the tongue as needed. 06/20/14   Chipper Herb, MD  Psyllium 500 MG CAPS Take 2 capsules by mouth daily.    [provider]  rosuvastatin (CRESTOR) 5 MG tablet Take 1 tablet (5 mg total) by mouth daily. 10/17/20   Orma Flaming, MD    Allergies    Shellfish allergy, Atorvastatin, and Livalo [pitavastatin]  Review of Systems   Review of Systems  Respiratory:  Positive for chest tightness and shortness of breath.   Neurological:  Positive for dizziness.  All other systems reviewed and are negative.  Physical Exam Updated Vital Signs BP (!) 165/81   Pulse 82   Temp 97.7 F (36.5 C)   Resp 18   Ht '5\' 4"'$  (1.626 m)   Wt 85.3 kg   SpO2 98%   BMI 32.27 kg/m   Physical Exam Vitals and nursing note reviewed.  Constitutional:      General: She is not in acute distress.    Appearance: Normal appearance. She is well-developed.  HENT:     Head: Normocephalic and atraumatic.     Right Ear: Hearing normal.     Left Ear: Hearing normal.     Nose: Nose normal.  Eyes:     Conjunctiva/sclera: Conjunctivae normal.     Pupils: Pupils are equal, round, and reactive to light.  Cardiovascular:     Rate and Rhythm: Regular rhythm.     Heart sounds: S1 normal and S2 normal. No murmur heard.   No friction rub. No gallop.  Pulmonary:     Effort: Pulmonary effort is normal. No respiratory distress.     Breath sounds: Normal breath sounds.  Chest:     Chest wall: No tenderness.  Abdominal:     General: Bowel sounds are normal.     Palpations: Abdomen is soft.     Tenderness: There is no abdominal tenderness. There is no guarding or rebound. Negative signs include Murphy's sign and McBurney's sign.     Hernia: No  hernia is present.  Musculoskeletal:        General: Normal range of motion.     Cervical back: Normal range of motion and neck supple.  Skin:    General: Skin is warm and dry.     Findings: No rash.  Neurological:     Mental Status: She is alert and oriented to person, place, and time.     GCS: GCS eye subscore is  4. GCS verbal subscore is 5. GCS motor subscore is 6.     Cranial Nerves: No cranial nerve deficit.     Sensory: No sensory deficit.     Coordination: Coordination normal.     Comments: Extraocular muscle movement: normal No visual field cut Pupils: equal and reactive both direct and consensual response is normal No nystagmus present    Sensory function is intact to light touch, pinprick Proprioception intact  Grip strength 5/5 symmetric in upper extremities No pronator drift Normal finger to nose bilaterally  Lower extremity strength 5/5 against gravity Normal heel to shin bilaterally  Gait: normal   Psychiatric:        Speech: Speech normal.        Behavior: Behavior normal.        Thought Content: Thought content normal.    ED Results / Procedures / Treatments   Labs (all labs ordered are listed, but only abnormal results are displayed) Labs Reviewed  CBC WITH DIFFERENTIAL/PLATELET - Abnormal; Notable for the following components:      Result Value   WBC 12.1 (*)    Neutro Abs 7.8 (*)    All other components within normal limits  BASIC METABOLIC PANEL - Abnormal; Notable for the following components:   Potassium 3.3 (*)    Glucose, Bld 131 (*)    All other components within normal limits  URINALYSIS, ROUTINE W REFLEX MICROSCOPIC - Abnormal; Notable for the following components:   Color, Urine COLORLESS (*)    Hgb urine dipstick SMALL (*)    All other components within normal limits  TROPONIN I (HIGH SENSITIVITY)    EKG EKG Interpretation  Date/Time:  Friday May 10 2021 19:46:34 EDT Ventricular Rate:  86 PR Interval:  158 QRS  Duration: 88 QT Interval:  368 QTC Calculation: 440 R Axis:   50 Text Interpretation: Sinus rhythm with marked sinus arrhythmia with occasional Premature ventricular complexes Low voltage QRS Cannot rule out Anterior infarct , age undetermined Abnormal ECG Confirmed by Orpah Greek [phone]) on 05/10/2021 11:38:45 PM  Radiology CT HEAD WO CONTRAST (5MM)  Result Date: 05/11/2021 CLINICAL DATA:  Dizziness EXAM: CT HEAD WITHOUT CONTRAST TECHNIQUE: Contiguous axial images were obtained from the base of the skull through the vertex without intravenous contrast. COMPARISON:  None. FINDINGS: Brain: No acute intracranial abnormality. Specifically, no hemorrhage, hydrocephalus, mass lesion, acute infarction, or significant intracranial injury. Vascular: No hyperdense vessel or unexpected calcification. Skull: No acute calvarial abnormality. Sinuses/Orbits: Visualized paranasal sinuses and mastoids clear. Orbital soft tissues unremarkable. Other: None IMPRESSION: No acute intracranial abnormality. Electronically Signed   By: Rolm Baptise M.D.   On: 05/11/2021 01:00    Procedures Procedures   Medications Ordered in ED Medications - No data to display  ED Course  I have reviewed the triage vital signs and the nursing notes.  Pertinent labs & imaging results that were available during my care of the patient were reviewed by me and considered in my medical decision making (see chart for details).    MDM Rules/Calculators/A&P                           Patient presents to the emergency department for evaluation of acute onset of dizziness.  Patient was sitting at a table playing cards when the episode occurred.  She noted elevated blood pressure.  She waited approximately an hour and did not feel better so she came to the ER.  No focal neurologic symptoms during this event.  Patient had resolution of symptoms while waiting in the waiting room.  She has a normal neurologic exam.  Blood pressure was  elevated but is self-correcting.  I suspect that this was some anxiety after she thought she might be having a stroke.  Patient does have atrial fibrillation but is anticoagulated on Eliquis.  She is in sinus rhythm today.  She has been ambulatory in the department without any difficulty.  As she has back to her baseline and work-up has been reassuring, will discharge, follow-up with primary care. Final Clinical Impression(s) / ED Diagnoses Final diagnoses:  Dizziness    Rx / DC Orders ED Discharge Orders     None        Jeraline Marcinek, Gwenyth Allegra, MD 05/11/21 [phone]

## 2021-05-11 ENCOUNTER — Emergency Department (HOSPITAL_BASED_OUTPATIENT_CLINIC_OR_DEPARTMENT_OTHER): Payer: Medicare HMO

## 2021-05-11 LAB — TROPONIN I (HIGH SENSITIVITY): Troponin I (High Sensitivity): 7 ng/L (ref <18)

## 2021-05-11 LAB — URINALYSIS, ROUTINE W REFLEX MICROSCOPIC
Bilirubin Urine: NEGATIVE
Glucose, UA: NEGATIVE mg/dL
Ketones, ur: NEGATIVE mg/dL
Leukocytes,Ua: NEGATIVE
Nitrite: NEGATIVE
Protein, ur: NEGATIVE mg/dL
Specific Gravity, Urine: 1.006 (ref 1.005–1.030)
pH: 5.5 (ref 5.0–8.0)

## 2021-05-11 NOTE — ED Notes (Signed)
Patient transported to CT 

## 2021-07-07 ENCOUNTER — Other Ambulatory Visit: Payer: Self-pay | Admitting: Family Medicine

## 2021-09-13 ENCOUNTER — Other Ambulatory Visit: Payer: Self-pay

## 2021-09-13 ENCOUNTER — Encounter: Payer: Self-pay | Admitting: Physician Assistant

## 2021-09-13 ENCOUNTER — Ambulatory Visit (INDEPENDENT_AMBULATORY_CARE_PROVIDER_SITE_OTHER): Payer: Medicare HMO | Admitting: Physician Assistant

## 2021-09-13 VITALS — BP 124/60 | HR 79 | Temp 99.2°F | Ht 64.0 in | Wt 188.0 lb

## 2021-09-13 DIAGNOSIS — R509 Fever, unspecified: Secondary | ICD-10-CM | POA: Diagnosis not present

## 2021-09-13 DIAGNOSIS — U071 COVID-19: Secondary | ICD-10-CM | POA: Diagnosis not present

## 2021-09-13 LAB — POC COVID19 BINAXNOW: SARS Coronavirus 2 Ag: POSITIVE — AB

## 2021-09-13 LAB — POCT INFLUENZA A/B
Influenza A, POC: NEGATIVE
Influenza B, POC: NEGATIVE

## 2021-09-13 LAB — POCT RAPID STREP A (OFFICE): Rapid Strep A Screen: NEGATIVE

## 2021-09-13 MED ORDER — MOLNUPIRAVIR EUA 200MG CAPSULE: 4.0000 | ORAL_CAPSULE | Freq: Two times a day (BID) | ORAL | 0 refills | Status: AC

## 2021-09-13 NOTE — Patient Instructions (Signed)
It was great to see you!  I have sent in [place] (covid anti-viral medication) in for you.  COVID or suspected COVID home recommendations  For current/suspected COVID symptoms: - Please watch closely for new onset shortness of breath, worsening shortness of breath, dizziness, confusion or any worsening symptoms. If any of these occur, please contact us during business hours, and if after business hours, please seek urgent care or go to the closest emergency room.  -Consider purchasing a pulse oximeter. If your levels are 94% or below persistently, please seek care at the hospital.   -If you test positive for COVID, everyone, regardless of vaccination status, should stay home for 5 days since symptom onset (or if asymptomatic, on day of positive test.) If you have no symptoms or your symptoms are resolving after 5 days, you can leave your house. Continue to wear a mask around others for 5 additional days. If you have a fever, continue to stay home until your fever resolves without use of medication.  -Please inform any contacts of your positive result so they can appropriately quarantine/test.  -Push fluids and try to eat small, frequent meals with protein to maintain your stamina.  -You may use over the counter tylenol (acetaminophen) per package instructions to reduce fever. Nonsteroidal anti-inflammatory drugs (NSAIDs), such as ibuprofen, have been suggested to cause harm in patients with COVID-19, but we currently don't know much about this. Given the uncertainty, we recommend Tylenol as the preferred fever-reducing medication for most patients rather than NSAIDs. If NSAIDs are needed, we recommend the lowest effective dose. (We do not routinely stop NSAIDs if you are already using them for your chronic illness.)   Take care,  Inda Coke PA-C

## 2021-09-13 NOTE — Progress Notes (Signed)
Nancy Mccormick is a 87 y.o. female here for a sore throat.   History of Present Illness:   Chief Complaint  Patient presents with   Fever    Last night temp was 101 Took Tylenol    Sore Throat    Has a lot of mucus that she need to get out Started 1 week ago    Cough   HPI  Covid Positive Nancy Mccormick initially presents with c/o a sore throat and cough that has been onset for a week, recently worsening as of yesterday. Pt states the cough was productive and consisted of a lot of mucus that she needed to get out. According to Nancy Mccormick she did have a fever last night of 101. Upon receiving this reading she took some tylenol which provided her with relief.   She has never had COVID and she has not had to most recent COVID bivalent booster vaccine. She has had other COVID boosters and vaccines. She currently lives in an assisted living center and believes she contracted while there. Denies CP or SOB, underlying lung issues.   Past Medical History:  Diagnosis Date   Arthritis    back, left shoulder, and fingers   Atrial fibrillation ([place])    History of chicken pox    Hyperlipidemia    Hypertension    Osteoporosis    Stroke [facility])      Social History   Tobacco Use   Smoking status: Former    Types: Cigarettes    Quit date: 12/17/1986    Years since quitting: 34.7   Smokeless tobacco: Never   Tobacco comments:    Smoke briefly in the distant past  Vaping Use   Vaping Use: Never used  Substance Use Topics   Alcohol use: No   Drug use: No    Past Surgical History:  Procedure Laterality Date   CATARACT EXTRACTION     KNEE ARTHROSCOPY Right 07/04/2015   Procedure: RIGHT KNEE ARTHROSCOPY WITH MEDIAL MENISCAL DEBRIDEMENT AND CHONDROPLASTY;  Surgeon: Nancy Arabian, MD;  Location: [facility];  Service: Orthopedics;  Laterality: Right;   OPEN REDUCTION INTERNAL FIXATION (ORIF) DISTAL RADIAL FRACTURE Right 12/28/2012   Procedure: OPEN REDUCTION INTERNAL FIXATION (ORIF) DISTAL RADIAL  FRACTURE;  Surgeon: Nancy Dibble, MD;  Location: [place];  Service: Orthopedics;  Laterality: Right;   VAGINAL HYSTERECTOMY      Family History  Problem Relation Age of Onset   Stroke Mother 65   Anemia Mother    Heart disease Mother    Hearing loss Mother    Heart attack Mother    Hypertension Mother    Stroke Father 37   Early death Father    Hypertension Father    Stroke Sister 84   Diabetes Sister    Hypertension Sister    Hypertension Son     Allergies  Allergen Reactions   Shellfish Allergy Itching, Swelling and Rash   Atorvastatin Other (See Comments)    myalgias    Livalo [Pitavastatin] Other (See Comments)    myalgias    Current Medications:   Current Outpatient Medications:    acetaminophen (TYLENOL) 500 MG tablet, Take 1,000 mg by mouth every 6 (six) hours as needed for mild pain or moderate pain., Disp: , Rfl:    Calcium Carbonate-Vitamin D (CALCIUM + D PO), Take 1 tablet by mouth at bedtime., Disp: , Rfl:    Cascara Sagrada 450 MG CAPS, Take by mouth., Disp: , Rfl:    Chlorpheniramine-DM (CORICIDIN  HBP COUGH/COLD PO), Take by mouth., Disp: , Rfl:    diltiazem (CARDIZEM CD) 240 MG 24 hr capsule, TAKE 1 CAPSULE(240 MG) BY MOUTH DAILY, Disp: 90 capsule, Rfl: 3   ELIQUIS 5 MG TABS tablet, TAKE 1 TABLET(5 MG) BY MOUTH TWICE DAILY, Disp: 180 tablet, Rfl: 1   molnupiravir EUA (LAGEVRIO) 200 mg CAPS capsule, Take 4 capsules (800 mg total) by mouth 2 (two) times daily for 5 days., Disp: 40 capsule, Rfl: 0   Multiple Vitamin (MULTIVITAMIN WITH MINERALS) TABS tablet, Take 1 tablet by mouth at bedtime., Disp: , Rfl:    nitroGLYCERIN (NITROSTAT) 0.4 MG SL tablet, Place 1 tablet (0.4 mg total) under the tongue as needed., Disp: 25 tablet, Rfl: 11   Psyllium 500 MG CAPS, Take 2 capsules by mouth daily., Disp: , Rfl:    rosuvastatin (CRESTOR) 5 MG tablet, TAKE 1 TABLET(5 MG) BY MOUTH DAILY, Disp: 90 tablet, Rfl: 3   Review of Systems:   ROS Negative  unless otherwise specified per HPI.  Vitals:   Vitals:   09/13/21 1001  BP: 124/60  Pulse: 79  Temp: 99.2 F (37.3 C)  TempSrc: Oral  SpO2: 95%  Weight: 188 lb (85.3 kg)  Height: 5\' 4"  (1.626 m)     Body mass index is 32.27 kg/m.  Physical Exam:   Physical Exam Vitals and nursing note reviewed.  Constitutional:      General: She is not in acute distress.    Appearance: She is well-developed. She is not ill-appearing or toxic-appearing.  Cardiovascular:     Rate and Rhythm: Normal rate and regular rhythm.     Pulses: Normal pulses.     Heart sounds: Normal heart sounds, S1 normal and S2 normal.  Pulmonary:     Effort: Pulmonary effort is normal.     Breath sounds: Normal breath sounds.  Skin:    General: Skin is warm and dry.  Neurological:     Mental Status: She is alert.     GCS: GCS eye subscore is 4. GCS verbal subscore is 5. GCS motor subscore is 6.  Psychiatric:        Speech: Speech normal.        Behavior: Behavior normal. Behavior is cooperative.   Results for orders placed or performed in visit on 09/13/21  POCT rapid strep A  Result Value Ref Range   Rapid Strep A Screen Negative Negative  POC COVID-19  Result Value Ref Range   SARS Coronavirus 2 Ag Positive (A) Negative  POCT Influenza A/B  Result Value Ref Range   Influenza A, POC Negative Negative   Influenza B, POC Negative Negative    Assessment and Plan:   Fever, unspecified fever cause COVID-19 positive; flu test and strep test negative Start Molnupiravir 800 mg twice daily for 5 days Provided COVID-19 precautions and advice on AVS Encouraged patient to push fluids Recommended patient remained quarantined up to 5 days, but wear a mask around others for up to 10 days after initial symptoms I advised patient that if new or worsening symptoms occur, to visit the ED immediately  I,Nancy Mccormick,acting as a scribe for [employer], PA.,have documented all relevant documentation on  the behalf of Nancy Coke, PA,as directed by  Nancy Coke, PA while in the presence of Nancy Mccormick, [place].  I, Nancy Mccormick, [place], have reviewed all documentation for this visit. The documentation on 09/13/21 for the exam, diagnosis, procedures, and orders are all accurate and complete.  Nancy Coke, PA-C

## 2021-10-05 ENCOUNTER — Other Ambulatory Visit: Payer: Self-pay | Admitting: Family Medicine

## 2021-10-29 NOTE — Progress Notes (Signed)
Cardiology Office Note   Date:  10/30/2021   ID:  Nancy Mccormick, DOB 1934/10/01, MRN [id number]  PCP:  Vivi Barrack, MD  Cardiologist:   Minus Breeding, MD   Chief Complaint  Patient presents with   Atrial Fibrillation      History of Present Illness: Nancy Mccormick is a 88 y.o. female who presents for followup of atrial fibrillation she was admitted in Nov 2018 with a small MCA infarct and was in atrial fib.  She was in the hospital in August of 2019.  She had atrial fib with RVR which converted spontaneously to NSR.    Since I last saw her she has done well.  She walks about 2 miles 3 times a week.  She teaches Bible studies.  He lives in independent living.  The patient denies any new symptoms such as chest discomfort, neck or arm discomfort. There has been no new shortness of breath, PND or orthopnea. There have been no reported palpitations, presyncope or syncope.  She did have 1 episode of her blood pressure spiking and went to an urgent care but otherwise its been well controlled.   Past Medical History:  Diagnosis Date   Arthritis    back, left shoulder, and fingers   Atrial fibrillation ([place])    History of chicken pox    Hyperlipidemia    Hypertension    Osteoporosis    Stroke [facility])     Past Surgical History:  Procedure Laterality Date   CATARACT EXTRACTION     KNEE ARTHROSCOPY Right 07/04/2015   Procedure: RIGHT KNEE ARTHROSCOPY WITH MEDIAL MENISCAL DEBRIDEMENT AND CHONDROPLASTY;  Surgeon: Gaynelle Arabian, MD;  [facility];  Service: Orthopedics;  Laterality: Right;   OPEN REDUCTION INTERNAL FIXATION (ORIF) DISTAL RADIAL FRACTURE Right 12/28/2012   Procedure: OPEN REDUCTION INTERNAL FIXATION (ORIF) DISTAL RADIAL FRACTURE;  Surgeon: Hessie Dibble, MD;  Location: [place];  Service: Orthopedics;  Laterality: Right;   VAGINAL HYSTERECTOMY       Current Outpatient Medications  Medication Sig Dispense Refill   acetaminophen (TYLENOL)  500 MG tablet Take 1,000 mg by mouth every 6 (six) hours as needed for mild pain or moderate pain.     Calcium Carbonate-Vitamin D (CALCIUM + D PO) Take 1 tablet by mouth at bedtime.     Cascara Sagrada 450 MG CAPS Take by mouth.     Chlorpheniramine-DM (CORICIDIN HBP COUGH/COLD PO) Take by mouth.     diltiazem (CARDIZEM CD) 240 MG 24 hr capsule TAKE 1 CAPSULE(240 MG) BY MOUTH DAILY 90 capsule 3   ELIQUIS 5 MG TABS tablet TAKE 1 TABLET(5 MG) BY MOUTH TWICE DAILY 180 tablet 1   Multiple Vitamin (MULTIVITAMIN WITH MINERALS) TABS tablet Take 1 tablet by mouth at bedtime.     nitroGLYCERIN (NITROSTAT) 0.4 MG SL tablet Place 1 tablet (0.4 mg total) under the tongue as needed. 25 tablet 11   Psyllium 500 MG CAPS Take 2 capsules by mouth daily.     rosuvastatin (CRESTOR) 5 MG tablet TAKE 1 TABLET(5 MG) BY MOUTH DAILY 90 tablet 3   No current facility-administered medications for this visit.    Allergies:   Shellfish allergy, Atorvastatin, and Livalo [pitavastatin]    ROS:  Please see the history of present illness.   Otherwise, review of systems are positive for none.   All other systems are reviewed and negative.    PHYSICAL EXAM: VS:  BP 136/74 (BP Location: Left Arm, Patient Position:  Sitting, Cuff Size: Normal)    Pulse 74    Ht 5\' 4"  (1.626 m)    Wt 182 lb (82.6 kg)    BMI 31.24 kg/m  , BMI Body mass index is 31.24 kg/m. GENERAL:  Well appearing NECK:  No jugular venous distention, waveform within normal limits, carotid upstroke brisk and symmetric, no bruits, no thyromegaly LUNGS:  Clear to auscultation bilaterally CHEST:  Unremarkable HEART:  PMI not displaced or sustained,S1 and S2 within normal limits, no S3, no S4, no clicks, no rubs, no murmurs ABD:  Flat, positive bowel sounds normal in frequency in pitch, no bruits, no rebound, no guarding, no midline pulsatile mass, no hepatomegaly, no splenomegaly EXT:  2 plus pulses throughout, no edema, no cyanosis no clubbing   EKG:  EKG  is not ordered today. The ekg ordered 05/10/2021 demonstrates sinus rhythm, rate 86, premature atrial contractions, no acute ST-T wave changes.   Recent Labs: 05/10/2021: BUN 14; Creatinine, Ser 0.72; Hemoglobin 13.0; Platelets 361; Potassium 3.3; Sodium 141    Lipid Panel    Component Value Date/Time   CHOL 184 10/31/2020 0752   CHOL 203 (H) 03/28/2019 0936   CHOL 129 03/31/2013 0826   TRIG 181.0 (H) 10/31/2020 0752   TRIG 147 04/22/2016 1232   TRIG 87 03/31/2013 0826   HDL 69.20 10/31/2020 0752   HDL 78 03/28/2019 0936   HDL 69 04/22/2016 1232   HDL 56 03/31/2013 0826   CHOLHDL 3 10/31/2020 0752   VLDL 36.2 10/31/2020 0752   LDLCALC 78 10/31/2020 0752   LDLCALC 93 03/28/2019 0936   LDLCALC 135 (H) 06/15/2014 1247   LDLCALC 56 03/31/2013 0826      Wt Readings from Last 3 Encounters:  10/30/21 182 lb (82.6 kg)  09/13/21 188 lb (85.3 kg)  05/10/21 188 lb (85.3 kg)      Other studies Reviewed: Additional studies/ records that were reviewed today include: Urgent care records and labs. Review of the above records demonstrates:  Please see elsewhere in the note.     ASSESSMENT AND PLAN:  ATRIAL FIBRILLATION:      She tolerates anticoagulation.  She has no symptomatic paroxysms.  She tolerates anticoagulation.  She is up-to-date with blood work.  No change in therapy.  HTN:   The blood pressure is Nancy Mccormick has a CHA2DS2 - VASc score of 6.     DYSLIPIDEMIA:    LDL is 78 last year with an HDL of 69.2.  No change in therapy.     Current medicines are reviewed at length with the patient today.  The patient does not have concerns regarding medicines.  The following changes have been made: None  Labs/ tests ordered today include:   None  No orders of the defined types were placed in this encounter.    Disposition:   FU with me in 12 months.     Signed, Minus Breeding, MD  10/30/2021 9:55 AM    [facility]

## 2021-10-30 ENCOUNTER — Encounter: Payer: Self-pay | Admitting: Cardiology

## 2021-10-30 ENCOUNTER — Ambulatory Visit: Payer: Medicare HMO | Admitting: Cardiology

## 2021-10-30 ENCOUNTER — Other Ambulatory Visit: Payer: Self-pay

## 2021-10-30 VITALS — BP 136/74 | HR 74 | Ht 64.0 in | Wt 182.0 lb

## 2021-10-30 DIAGNOSIS — I480 Paroxysmal atrial fibrillation: Secondary | ICD-10-CM | POA: Diagnosis not present

## 2021-10-30 DIAGNOSIS — E785 Hyperlipidemia, unspecified: Secondary | ICD-10-CM

## 2021-10-30 DIAGNOSIS — I10 Essential (primary) hypertension: Secondary | ICD-10-CM | POA: Diagnosis not present

## 2021-10-30 NOTE — Patient Instructions (Signed)
Medication Instructions:  Your physician recommends that you continue on your current medications as directed. Please refer to the Current Medication list given to you today.  *If you need a refill on your cardiac medications before your next appointment, please call your pharmacy*  Lab Work: NONE   Testing/Procedures: NONE   Follow-Up: At [facility], you and your health needs are our priority.  As part of our continuing mission to provide you with exceptional heart care, we have created designated Provider Care Teams.  These Care Teams include your primary Cardiologist (physician) and Advanced Practice Providers (APPs -  Physician Assistants and Nurse Practitioners) who all work together to provide you with the care you need, when you need it.  We recommend signing up for the patient portal called "MyChart".  Sign up information is provided on this After Visit Summary.  MyChart is used to connect with patients for Virtual Visits (Telemedicine).  Patients are able to view lab/test results, encounter notes, upcoming appointments, etc.  Non-urgent messages can be sent to your provider as well.   To learn more about what you can do with MyChart, go to [email or web address].    Your next appointment:   12 month(s)  The format for your next appointment:   In Person  Provider:   Minus Breeding, MD

## 2021-11-08 ENCOUNTER — Other Ambulatory Visit: Payer: Self-pay

## 2021-11-08 ENCOUNTER — Ambulatory Visit (INDEPENDENT_AMBULATORY_CARE_PROVIDER_SITE_OTHER): Payer: Medicare HMO | Admitting: Family Medicine

## 2021-11-08 ENCOUNTER — Encounter: Payer: Self-pay | Admitting: Family Medicine

## 2021-11-08 VITALS — BP 98/60 | HR 81 | Temp 96.9°F | Ht 64.0 in | Wt 185.0 lb

## 2021-11-08 DIAGNOSIS — I10 Essential (primary) hypertension: Secondary | ICD-10-CM

## 2021-11-08 DIAGNOSIS — K5900 Constipation, unspecified: Secondary | ICD-10-CM | POA: Diagnosis not present

## 2021-11-08 DIAGNOSIS — E785 Hyperlipidemia, unspecified: Secondary | ICD-10-CM

## 2021-11-08 DIAGNOSIS — I4891 Unspecified atrial fibrillation: Secondary | ICD-10-CM | POA: Diagnosis not present

## 2021-11-08 NOTE — Assessment & Plan Note (Signed)
Follows with cardiology.  Anticoagulated on Eliquis and rate controlled with diltiazem.

## 2021-11-08 NOTE — Progress Notes (Signed)
° °  Nancy Mccormick is a 88 y.o. female who presents today for an office visit.  Assessment/Plan:  New/Acute Problems: Gastroenteritis No red flags.  Seems to be improving.  We discussed Zofran however she declined.  We will continue with watchful waiting.  Chronic Problems Addressed Today: Essential hypertension At goal with diltiazem 240 mg daily.  Dyslipidemia (high LDL; low HDL) On Crestor 5 mg daily.  Check lipids next blood draw.  Constipation Recommended MiraLAX daily as needed.  Atrial fibrillation (Nancy Mccormick) Follows with cardiology.  Anticoagulated on Eliquis and rate controlled with diltiazem.  Preventative Health She will come back for shingles vaccine.     Subjective:  HPI:  Patient here for follow-up.  She had COVID a couple of months ago.  She still has some fatigue but this seems to be improving.  She has also had a GI bug for the last couple of days.  Symptoms include nausea and diarrhea.  Also some abdominal pain.  Symptoms seem to be improving today.  She has been getting plenty of fluids.  No reported fevers or chills.  No reported melena or hematochezia.       Objective:  Physical Exam: BP 98/60 (BP Location: Right Arm)    Pulse 81    Temp (!) 96.9 F (36.1 C) (Temporal)    Ht 5\' 4"  (1.626 m)    Wt 185 lb (83.9 kg)    SpO2 96%    BMI 31.76 kg/m   Gen: No acute distress, resting comfortably CV: Regular rate and rhythm with no murmurs appreciated Pulm: Normal work of breathing, clear to auscultation bilaterally with no crackles, wheezes, or rhonchi GI: Bowel sounds present, soft, nontender, nondistended Neuro: Grossly normal, moves all extremities Psych: Normal affect and thought content      [name] M. Jerline Pain, MD 11/08/2021 10:26 AM

## 2021-11-08 NOTE — Assessment & Plan Note (Signed)
At goal with diltiazem 240 mg daily.

## 2021-11-08 NOTE — Assessment & Plan Note (Signed)
Recommended MiraLAX daily as needed.

## 2021-11-08 NOTE — Patient Instructions (Signed)
It was very nice to see you today!  We will probably take a few more days for your GI bug to resolve.  Please make sure that you are getting plenty of fluids.  You can take MiraLAX daily as needed to help with constipation.  I will see you back in 6 months.  Come back sooner if needed.  Take care, Dr Jerline Pain  PLEASE NOTE:  If you had any lab tests please let us know if you have not heard back within a few days. You may see your results on mychart before we have a chance to review them but we will give you a call once they are reviewed by [place]. If we ordered any referrals today, please let us know if you have not heard from their office within the next week.   Please try these tips to maintain a healthy lifestyle:  Eat at least 3 REAL meals and 1-2 snacks per day.  Aim for no more than 5 hours between eating.  If you eat breakfast, please do so within one hour of getting up.   Each meal should contain half fruits/vegetables, one quarter protein, and one quarter carbs (no bigger than a computer mouse)  Cut down on sweet beverages. This includes juice, soda, and sweet tea.   Drink at least 1 glass of water with each meal and aim for at least 8 glasses per day  Exercise at least 150 minutes every week.

## 2021-11-08 NOTE — Assessment & Plan Note (Addendum)
On Crestor 5 mg daily.  Check lipids next blood draw.

## 2021-11-18 ENCOUNTER — Ambulatory Visit (INDEPENDENT_AMBULATORY_CARE_PROVIDER_SITE_OTHER): Payer: Medicare HMO | Admitting: Family Medicine

## 2021-11-18 ENCOUNTER — Encounter: Payer: Self-pay | Admitting: Family Medicine

## 2021-11-18 ENCOUNTER — Other Ambulatory Visit: Payer: Self-pay

## 2021-11-18 ENCOUNTER — Telehealth: Payer: Self-pay | Admitting: Family Medicine

## 2021-11-18 VITALS — BP 130/70 | HR 66 | Temp 98.7°F | Ht 64.0 in | Wt 190.0 lb

## 2021-11-18 DIAGNOSIS — R5383 Other fatigue: Secondary | ICD-10-CM

## 2021-11-18 DIAGNOSIS — R1013 Epigastric pain: Secondary | ICD-10-CM

## 2021-11-18 MED ORDER — OMEPRAZOLE 40 MG PO CPDR: 40.0000 mg | DELAYED_RELEASE_CAPSULE | Freq: Every day | ORAL | 3 refills | Status: DC

## 2021-11-18 NOTE — Patient Instructions (Signed)
Meds have been sent the the pharmacy You can take tylenol for pain/fevers If worsening symptoms, let us know or go to the Emergency room   Take 2 doses of miralax today.  If worse, no BM soon, ER

## 2021-11-18 NOTE — Telephone Encounter (Signed)
Pt has an appt today with Dr Cherlynn Kaiser at 3:30pm  Patient Name: Nancy Mccormick Gender: Female DOB: Aug 30, 1934 Age: 88 Y 97 M 3 D Return Phone Number: [phone] (Primary) Address: City/ State/ Zip: [place]  [address] Client [facility] Client Site [place] [facility] [place] Night Provider Dimas Chyle- MD Contact Type Call Who Is Calling Patient / Member / Family / Caregiver Call Type Triage / Clinical Relationship To Patient Self Return Phone Number [phone] (Primary) Chief Complaint CHEST PAIN - pain, pressure, heaviness or tightness Reason for Call Symptomatic / Request for [place] states she was seen on February 3th due to stomach virus. Caller states she is having abdomen pain, constipation, ingestion (heart burn), extreme exhaustion, and nausea. Translation No Nurse Assessment Nurse: Thad Ranger, RN, Denise Date/Time (Eastern Time): 11/18/2021 8:22:27 AM Confirm and document reason for call. If symptomatic, describe symptoms. ---Caller states she was seen on February 3th due to stomach virus. Caller states she is having abdomen pain, constipation, ingestion (heart burn), extreme exhaustion, and nausea. No vomiting or diarrhea. Does the patient have any new or worsening symptoms? ---Yes Will a triage be completed? ---Yes Related visit to physician within the last 2 weeks? ---Yes Does the PT have any chronic conditions? (i.e. diabetes, asthma, this includes High risk factors for pregnancy, etc.) ---No Is this a behavioral health or substance abuse call? ---No Guidelines Guideline Title Affirmed Question Affirmed Notes Nurse Date/Time (Eastern Time) Nausea Nausea lasts > 1 week [name], RN, Langley Gauss 11/18/2021 8:23:35 AM Disp. Time [name] Time) Disposition Final User 11/18/2021 8:20:52 AM Send to Urgent [name] 11/18/2021 8:27:28 AM SEE PCP WITHIN 3 DAYS Yes  [name], RN, Yevette Edwards Disagree/Comply Comply Caller Understands Yes PreDisposition Call Doctor Care Advice Given Per Guideline SEE PCP WITHIN 3 DAYS: CLEAR FLUIDS: * Take clear fluids in small amounts until the nausea is resolved for 8 hours: * Sip water or rehydration liquid (Gatorade or Powerade) * Other options: 1/2 strength flat lemon-lime soda or ginger ale. SOLIDS: * Gradually return to a normal diet. * Start with saltine crackers, white bread, rice, mashed potatoes, cereal, apple sauce etc. AVOID MEDS: * Stop taking all non-prescription medicines. Reason: May make nausea worse. CALL BACK IF: * You become worse CARE ADVICE given per Nausea (Adult) guideline. Referrals REFERRED TO PCP OFFICE

## 2021-11-18 NOTE — Telephone Encounter (Signed)
Noted  

## 2021-11-18 NOTE — Progress Notes (Signed)
Subjective:     Patient ID: Nancy Mccormick, female    DOB: Apr 19, 1934, 88 y.o.   MRN: [id number]  Chief Complaint  Patient presents with   Nausea   Fatigue    Extreme fatigue    Constipation    No bowel movement in 2 days, usually go every day    Abdominal Pain    feels like stomach is in knots, having a lot of indigestion Sx started 2 weeks ago after being dx with stomach virus     HPI Nausea, extreme fatigue-had covid end of December. Seen 2 wks ago-diarrhea.  Improved.  Nausea constant now. Some better if eats crackers for about an hour. Ate grits today and some better. No vomiting.  Some abd pain-mild "knawing" mid epi area.  No bm in 2 days-changed probitics to miralax.  Drank prune juice for 2 days and no luck Heart burn-taking some tums. Dx GE 2 wks ago  Tired, but if does anything-so much worse and can be "panting".  Takes shower and has to rest.  No CP.  Falls asleep sitting past few wks.  No dark stools.    No dysuria  Health Maintenance Due  Topic Date Due   Zoster Vaccines- Shingrix (1 of 2) Never done   COVID-19 Vaccine (5 - Booster for Pfizer series) 06/26/2021    Past Medical History:  Diagnosis Date   Arthritis    back, left shoulder, and fingers   Atrial fibrillation ([place])    History of chicken pox    Hyperlipidemia    Hypertension    Osteoporosis    Stroke [facility])     Past Surgical History:  Procedure Laterality Date   CATARACT EXTRACTION     KNEE ARTHROSCOPY Right 07/04/2015   Procedure: RIGHT KNEE ARTHROSCOPY WITH MEDIAL MENISCAL DEBRIDEMENT AND CHONDROPLASTY;  Surgeon: Gaynelle Arabian, MD;  Location: [facility];  Service: Orthopedics;  Laterality: Right;   OPEN REDUCTION INTERNAL FIXATION (ORIF) DISTAL RADIAL FRACTURE Right 12/28/2012   Procedure: OPEN REDUCTION INTERNAL FIXATION (ORIF) DISTAL RADIAL FRACTURE;  Surgeon: Hessie Dibble, MD;  Location: [place];  Service: Orthopedics;  Laterality: Right;   VAGINAL HYSTERECTOMY       Outpatient Medications Prior to Visit  Medication Sig Dispense Refill   acetaminophen (TYLENOL) 500 MG tablet Take 1,000 mg by mouth every 6 (six) hours as needed for mild pain or moderate pain.     Calcium Carbonate-Vitamin D (CALCIUM + D PO) Take 1 tablet by mouth at bedtime.     diltiazem (CARDIZEM CD) 240 MG 24 hr capsule TAKE 1 CAPSULE(240 MG) BY MOUTH DAILY 90 capsule 3   ELIQUIS 5 MG TABS tablet TAKE 1 TABLET(5 MG) BY MOUTH TWICE DAILY 180 tablet 1   Multiple Vitamin (MULTIVITAMIN WITH MINERALS) TABS tablet Take 1 tablet by mouth at bedtime.     nitroGLYCERIN (NITROSTAT) 0.4 MG SL tablet Place 1 tablet (0.4 mg total) under the tongue as needed. 25 tablet 11   polyethylene glycol (MIRALAX / GLYCOLAX) 17 g packet Take 17 g by mouth daily.     Psyllium 500 MG CAPS Take 2 capsules by mouth daily.     rosuvastatin (CRESTOR) 5 MG tablet TAKE 1 TABLET(5 MG) BY MOUTH DAILY 90 tablet 3   Cascara Sagrada 450 MG CAPS Take by mouth. (Patient not taking: Reported on 11/08/2021)     Chlorpheniramine-DM (CORICIDIN HBP COUGH/COLD PO) Take by mouth. (Patient not taking: Reported on 11/08/2021)     No facility-administered  medications prior to visit.    Allergies  Allergen Reactions   Shellfish Allergy Itching, Swelling and Rash   ZTI:[id number] except as noted in HPI      Objective:     BP 130/70    Pulse 66    Temp 98.7 F (37.1 C) (Temporal)    Ht 5\' 4"  (1.626 m)    Wt 190 lb (86.2 kg)    SpO2 95%    BMI 32.61 kg/m  Wt Readings from Last 3 Encounters:  11/18/21 190 lb (86.2 kg)  11/08/21 185 lb (83.9 kg)  10/30/21 182 lb (82.6 kg)        Gen: WDWN NAD [facility] HEENT: NCAT, conjunctiva not injected, sclera nonicteric. No pale NECK:  supple, no thyromegaly, no nodes, no carotid bruits CARDIAC: RRR w/occ ectopic beat.  S1S2+, no murmur. DP 1+B LUNGS: CTAB. No wheezes ABDOMEN:  BS+, soft, mod tender Mid epi/LUQ, No HSM, no masses EXT:  no edema MSK: no gross  abnormalities.  NEURO: A&O x3.  CN II-XII intact.  PSYCH: normal mood. Good eye contact  EKG: occ ectopic beat. No acute ST changes.  Assessment & Plan:   Problem List Items Addressed This Visit   None Visit Diagnoses     Other fatigue    -  Primary   Relevant Orders   CBC with Differential/Platelet   Comprehensive metabolic panel   TSH   Amylase   Lipase   EKG 12-Lead   Epigastric pain       Relevant Orders   Amylase   Lipase   EKG 12-Lead   H. pylori antibody, IgG      Fatigue-check labs-? Post covid delay, from diarrhea, anemia,other.  Check labs. Check EKG as some DOE Mid epi pain, nausea, heartburn-check labs, ekg.  Increase miralax to 2 doses today.  Trial omeprazole(?ulcer).    If worse, pain inc, etc-to ER.  Meds ordered this encounter  Medications   omeprazole (PRILOSEC) 40 MG capsule    Sig: Take 1 capsule (40 mg total) by mouth daily.    Dispense:  30 capsule    Refill:  3    Wellington Hampshire, MD

## 2021-11-19 LAB — CBC WITH DIFFERENTIAL/PLATELET
Basophils Absolute: 0.1 10*3/uL (ref 0.0–0.1)
Basophils Relative: 1.1 % (ref 0.0–3.0)
Eosinophils Absolute: 0.2 10*3/uL (ref 0.0–0.7)
Eosinophils Relative: 1.9 % (ref 0.0–5.0)
HCT: 38.8 % (ref 36.0–46.0)
Hemoglobin: 12.7 g/dL (ref 12.0–15.0)
Lymphocytes Relative: 48.1 % — ABNORMAL HIGH (ref 12.0–46.0)
Lymphs Abs: 4.8 10*3/uL — ABNORMAL HIGH (ref 0.7–4.0)
MCHC: 32.6 g/dL (ref 30.0–36.0)
MCV: 91.9 fl (ref 78.0–100.0)
Monocytes Absolute: 0.8 10*3/uL (ref 0.1–1.0)
Monocytes Relative: 8.2 % (ref 3.0–12.0)
Neutro Abs: 4.1 10*3/uL (ref 1.4–7.7)
Neutrophils Relative %: 40.7 % — ABNORMAL LOW (ref 43.0–77.0)
Platelets: 306 10*3/uL (ref 150.0–400.0)
RBC: 4.23 Mil/uL (ref 3.87–5.11)
RDW: 15.4 % (ref 11.5–15.5)
WBC: 10 10*3/uL (ref 4.0–10.5)

## 2021-11-19 LAB — TSH: TSH: 5.42 u[IU]/mL (ref 0.35–5.50)

## 2021-11-19 LAB — H. PYLORI ANTIBODY, IGG: H Pylori IgG: NEGATIVE

## 2021-11-20 LAB — COMPREHENSIVE METABOLIC PANEL
ALT: 80 U/L — ABNORMAL HIGH (ref 0–35)
AST: 49 U/L — ABNORMAL HIGH (ref 0–37)
Albumin: 3.6 g/dL (ref 3.5–5.2)
Alkaline Phosphatase: 166 U/L — ABNORMAL HIGH (ref 39–117)
BUN: 12 mg/dL (ref 6–23)
CO2: 29 mEq/L (ref 19–32)
Calcium: 9.3 mg/dL (ref 8.4–10.5)
Chloride: 106 mEq/L (ref 96–112)
Creatinine, Ser: 0.83 mg/dL (ref 0.40–1.20)
GFR: 64.31 mL/min (ref >60.00)
Glucose, Bld: 99 mg/dL (ref 70–99)
Potassium: 5.3 mEq/L — ABNORMAL HIGH (ref 3.5–5.1)
Sodium: 145 mEq/L (ref 135–145)
Total Bilirubin: 0.6 mg/dL (ref 0.2–1.2)
Total Protein: 6.2 g/dL (ref 6.0–8.3)

## 2021-11-20 LAB — AMYLASE: Amylase: 36 U/L (ref 27–131)

## 2021-11-20 LAB — LIPASE: Lipase: 29 U/L (ref 11.0–59.0)

## 2021-11-21 NOTE — Progress Notes (Signed)
Hi team can we make sure that she has an appointment to see me in a few weeks?  Algis Greenhouse. Jerline Pain, MD 11/21/2021 4:23 PM

## 2021-12-02 ENCOUNTER — Encounter: Payer: Self-pay | Admitting: Family Medicine

## 2021-12-02 ENCOUNTER — Ambulatory Visit (INDEPENDENT_AMBULATORY_CARE_PROVIDER_SITE_OTHER): Payer: Medicare HMO | Admitting: Family Medicine

## 2021-12-02 ENCOUNTER — Other Ambulatory Visit: Payer: Self-pay

## 2021-12-02 VITALS — BP 128/76 | HR 75 | Temp 97.7°F | Ht 64.0 in | Wt 186.8 lb

## 2021-12-02 DIAGNOSIS — R5383 Other fatigue: Secondary | ICD-10-CM

## 2021-12-02 DIAGNOSIS — K5900 Constipation, unspecified: Secondary | ICD-10-CM | POA: Diagnosis not present

## 2021-12-02 DIAGNOSIS — R0609 Other forms of dyspnea: Secondary | ICD-10-CM

## 2021-12-02 DIAGNOSIS — I10 Essential (primary) hypertension: Secondary | ICD-10-CM | POA: Diagnosis not present

## 2021-12-02 DIAGNOSIS — E785 Hyperlipidemia, unspecified: Secondary | ICD-10-CM

## 2021-12-02 LAB — CBC
HCT: 37.7 % (ref 36.0–46.0)
Hemoglobin: 12.5 g/dL (ref 12.0–15.0)
MCHC: 33.3 g/dL (ref 30.0–36.0)
MCV: 91 fl (ref 78.0–100.0)
Platelets: 336 10*3/uL (ref 150.0–400.0)
RBC: 4.14 Mil/uL (ref 3.87–5.11)
RDW: 15.7 % — ABNORMAL HIGH (ref 11.5–15.5)
WBC: 8.9 10*3/uL (ref 4.0–10.5)

## 2021-12-02 LAB — COMPREHENSIVE METABOLIC PANEL
ALT: 28 U/L (ref 0–35)
AST: 24 U/L (ref 0–37)
Albumin: 3.8 g/dL (ref 3.5–5.2)
Alkaline Phosphatase: 116 U/L (ref 39–117)
BUN: 13 mg/dL (ref 6–23)
CO2: 28 mEq/L (ref 19–32)
Calcium: 9 mg/dL (ref 8.4–10.5)
Chloride: 106 mEq/L (ref 96–112)
Creatinine, Ser: 0.69 mg/dL (ref 0.40–1.20)
GFR: 79.16 mL/min (ref >60.00)
Glucose, Bld: 108 mg/dL — ABNORMAL HIGH (ref 70–99)
Potassium: 3.9 mEq/L (ref 3.5–5.1)
Sodium: 141 mEq/L (ref 135–145)
Total Bilirubin: 0.7 mg/dL (ref 0.2–1.2)
Total Protein: 6.4 g/dL (ref 6.0–8.3)

## 2021-12-02 NOTE — Progress Notes (Signed)
° °  Nancy Mccormick is a 88 y.o. female who presents today for an office visit.  Assessment/Plan:  New/Acute Problems: Other Fatigue We will recheck TSH.  Also check other labs including CBC, c-Met, T3, T4, and thyroid peroxidase antibodies.  Also check B12.  Given her other constellation of symptoms we will also check an echocardiogram to evaluate for EF and possible aortic stenosis.  Encouraged good hydration.  Dyspnea on exertion Possibly related to dehydration due to her recent GI illness though she does have a new systolic murmur on exam with some lower extremity edema.  Her lung exam today is normal. This could also be possibly explained by potential hyperthyroidism we will check an echocardiogram to further evaluate her EF and rule out aortic stenosis.  If symptoms persist and above work-up is negative may need to have her follow back up with cardiology.   Chronic Problems Addressed Today: Constipation Worsened recently.  Possibly worsened due to recent GI illness and dehydration.  She can continue over-the-counter medications.  Recommended probiotic supplementation.  We will be ruling out thyroid disease as above.  If this continues to be an issue and her above work-up is normal would consider referral to ENT.  Essential hypertension At goal with diltiazem 240 mg daily.  Dyslipidemia (high LDL; low HDL) She will continue holding Crestor until we recheck LFTs.     Subjective:  HPI:  Patient is here for follow-up.  She was seen 2 weeks ago by different provider at this office for extreme fatigue, constipation, and abdominal pain. . Prior to this appointment she had covid about 6 weeks prior..Had labs at that time which showed mildly elevated LFTs and a borderline TSH She had a "stomach virus" about a month ago. Some shortness of breath. No chest pain. She feels very winded even with taking a shower. Some swelling in her legs. No orthopnea.   Currently she is still having excessive  fatigue and a lot of constipation. She has tried miralax without much improvement. She sometimes has to take a suppository to have a bowel movement. In the past she has had used cascara Sagada but stopped due to concerns for harming her liver. She is currently sleeping "a lot." Sleeping more than usual. She has occasional abdominal pain which seems to be improving. Fatigue has been stable the last few weeks. She has also had a few episodes of "swimmy headedness" while out at restaurants the last couple of weeks.  This usually resolves after drinking a glass of water.  No loss of consciousness.       Objective:  Physical Exam: BP 128/76 (BP Location: Left Arm)    Pulse 75    Temp 97.7 F (36.5 C) (Temporal)    Ht $R'5\' 4"'WR$  (1.626 m)    Wt 186 lb 12.8 oz (84.7 kg)    SpO2 98%    BMI 32.06 kg/m   Gen: No acute distress, resting comfortably CV: Irregular. 2 out of 6 systolic murmur noted. Pulm: Normal work of breathing, clear to auscultation bilaterally with no crackles, wheezes, or rhonchi MSK: 1+ pitting edema to knees bilaterally Neuro: Grossly normal, moves all extremities Psych: Normal affect and thought content      Nzinga Ferran M. Jerline Pain, MD 12/02/2021 11:11 AM

## 2021-12-02 NOTE — Assessment & Plan Note (Signed)
Worsened recently.  Possibly worsened due to recent GI illness and dehydration.  She can continue over-the-counter medications.  Recommended probiotic supplementation.  We will be ruling out thyroid disease as above.  If this continues to be an issue and her above work-up is normal would consider referral to ENT.

## 2021-12-02 NOTE — Patient Instructions (Signed)
It was very nice to see you today!  We will recheck lab work today.  We will also order an echocardiogram.  Please try using a probiotic.  You can continue using over-the-counter medications to help with constipation as needed though it is possible this could be coming from your potential thyroid issues.  Take care, Dr Jerline Pain  PLEASE NOTE:  If you had any lab tests please let us know if you have not heard back within a few days. You may see your results on mychart before we have a chance to review them but we will give you a call once they are reviewed by [place]. If we ordered any referrals today, please let us know if you have not heard from their office within the next week.   Please try these tips to maintain a healthy lifestyle:  Eat at least 3 REAL meals and 1-2 snacks per day.  Aim for no more than 5 hours between eating.  If you eat breakfast, please do so within one hour of getting up.   Each meal should contain half fruits/vegetables, one quarter protein, and one quarter carbs (no bigger than a computer mouse)  Cut down on sweet beverages. This includes juice, soda, and sweet tea.   Drink at least 1 glass of water with each meal and aim for at least 8 glasses per day  Exercise at least 150 minutes every week.

## 2021-12-02 NOTE — Assessment & Plan Note (Signed)
She will continue holding Crestor until we recheck LFTs.

## 2021-12-02 NOTE — Assessment & Plan Note (Signed)
At goal with diltiazem 240 mg daily.

## 2021-12-03 LAB — T3, FREE: T3, Free: 3.4 pg/mL (ref 2.3–4.2)

## 2021-12-03 LAB — TSH: TSH: 3.23 u[IU]/mL (ref 0.35–5.50)

## 2021-12-03 LAB — T4, FREE: Free T4: 0.71 ng/dL (ref 0.60–1.60)

## 2021-12-03 LAB — VITAMIN B12: Vitamin B-12: 309 pg/mL (ref 211–911)

## 2021-12-03 LAB — THYROID PEROXIDASE ANTIBODY: Thyroperoxidase Ab SerPl-aCnc: <1 IU/mL (ref <9)

## 2021-12-04 ENCOUNTER — Other Ambulatory Visit: Payer: Self-pay | Admitting: *Deleted

## 2021-12-04 NOTE — Progress Notes (Signed)
Please inform patient of the following:  Her labs are all NORMAL. No obvious causes for her fatigue. Like we discussed at her office visit, next step would be to get a echocardiogram. Please place order if patient is agreeable.  Algis Greenhouse. Jerline Pain, MD 12/04/2021 8:04 AM

## 2021-12-04 NOTE — Progress Notes (Signed)
Her echocardiogram has been ordered already. Can we check to make sure it gets scheduled soon?  Algis Greenhouse. Jerline Pain, MD 12/04/2021 11:18 AM

## 2021-12-06 ENCOUNTER — Telehealth: Payer: Self-pay | Admitting: Family Medicine

## 2021-12-06 NOTE — Telephone Encounter (Signed)
Spoke with [facility] Referral coordinator  ?She will be working on it  ?

## 2021-12-06 NOTE — Telephone Encounter (Signed)
Patient called stating that she has not heard back regarding her Echocardiogram being scheduled. Please call at the number on file. [facility]. ?

## 2021-12-16 NOTE — Telephone Encounter (Signed)
PA not needed- the [phone] code was denied. I have spoken with the patient and she is aware she will be getting a call for scheduling. ?

## 2021-12-16 NOTE — Telephone Encounter (Signed)
Peola Deroo's son Shanon Brow) called re echocardiogram - [facility] haves no record of this echo being requested- I see the requests notes, one from today "inbasket x3 sent for PA #".- said Holland Falling provided him with [phone] number to call - do you have any additional info for patient?  ? ? ? ? ?For Dr Jerline Pain : Patient having weakness and symptoms- Same issue/symptoms from 3/3. Patient has a MCR well visit on 3/20 and FU with Dr Jerline Pain on 8/3.  ? ?VML for son to call office back - Does patient need to be triaged?? Awaiting call back from son.-  ?

## 2021-12-16 NOTE — Telephone Encounter (Signed)
Per Debbora Presto is working on this now and will contact patient  ?

## 2021-12-19 ENCOUNTER — Ambulatory Visit: Payer: Medicare HMO | Admitting: Family Medicine

## 2021-12-19 ENCOUNTER — Other Ambulatory Visit: Payer: Medicare HMO

## 2021-12-20 ENCOUNTER — Other Ambulatory Visit: Payer: Self-pay

## 2021-12-20 ENCOUNTER — Ambulatory Visit (HOSPITAL_COMMUNITY): Payer: Medicare HMO | Attending: Cardiology

## 2021-12-20 DIAGNOSIS — R0609 Other forms of dyspnea: Secondary | ICD-10-CM | POA: Insufficient documentation

## 2021-12-20 LAB — ECHOCARDIOGRAM COMPLETE
Area-P 1/2: 3.18 cm2
P 1/2 time: 782 msec
S' Lateral: 3 cm

## 2021-12-23 ENCOUNTER — Ambulatory Visit: Payer: Medicare HMO | Admitting: Family Medicine

## 2021-12-23 ENCOUNTER — Ambulatory Visit (INDEPENDENT_AMBULATORY_CARE_PROVIDER_SITE_OTHER): Payer: Medicare HMO

## 2021-12-23 ENCOUNTER — Encounter: Payer: Self-pay | Admitting: Family Medicine

## 2021-12-23 VITALS — HR 64 | Temp 98.3°F | Ht 64.0 in | Wt 189.6 lb

## 2021-12-23 VITALS — BP 128/64 | HR 74 | Temp 98.0°F | Wt 189.6 lb

## 2021-12-23 DIAGNOSIS — I10 Essential (primary) hypertension: Secondary | ICD-10-CM | POA: Diagnosis not present

## 2021-12-23 DIAGNOSIS — I4891 Unspecified atrial fibrillation: Secondary | ICD-10-CM | POA: Diagnosis not present

## 2021-12-23 DIAGNOSIS — I351 Nonrheumatic aortic (valve) insufficiency: Secondary | ICD-10-CM | POA: Diagnosis not present

## 2021-12-23 DIAGNOSIS — Z0000 Encounter for general adult medical examination without abnormal findings: Secondary | ICD-10-CM

## 2021-12-23 NOTE — Patient Instructions (Signed)
It was very nice to see you today! ? ?You have regurgitation in your aortic valve.  This is probably causing some of your symptoms.  We will have you follow up with a cardiologist. ? ?Take care, ?Dr Jerline Pain ? ?PLEASE NOTE: ? ?If you had any lab tests please let us know if you have not heard back within a few days. You may see your results on mychart before we have a chance to review them but we will give you a call once they are reviewed by [place]. If we ordered any referrals today, please let us know if you have not heard from their office within the next week.  ? ?Please try these tips to maintain a healthy lifestyle: ? ?Eat at least 3 REAL meals and 1-2 snacks per day.  Aim for no more than 5 hours between eating.  If you eat breakfast, please do so within one hour of getting up.  ? ?Each meal should contain half fruits/vegetables, one quarter protein, and one quarter carbs (no bigger than a computer mouse) ? ?Cut down on sweet beverages. This includes juice, soda, and sweet tea.  ? ?Drink at least 1 glass of water with each meal and aim for at least 8 glasses per day ? ?Exercise at least 150 minutes every week.   ?

## 2021-12-23 NOTE — Assessment & Plan Note (Signed)
Blood pressure at goal this morning on diltiazem 240 mg daily. ?

## 2021-12-23 NOTE — Progress Notes (Signed)
Please inform patient of the following: ? ?Her echocardiogram shows that her heart has normal squeeze function though she does have mild to moderate aortic valve regurgitation.  This could explain some of her symptoms.  Would be a good idea for her to follow-up with cardiology regarding this - we can discuss at her office visit later today. ? ?Algis Greenhouse. Jerline Pain, MD ?12/23/2021 12:36 PM  ?

## 2021-12-23 NOTE — Progress Notes (Signed)
? ?Subjective:  ? Nancy Mccormick is a 88 y.o. female who presents for Medicare Annual (Subsequent) preventive examination. ? ?Review of Systems    ? ?Cardiac Risk Factors include: advanced age (>13mn, >>21women);dyslipidemia;hypertension;obesity (BMI >30kg/m2) ? ?   ?Objective:  ?  ?Today's Vitals  ? 12/23/21 0939  ?BP: 128/64  ?Pulse: 74  ?Temp: 98 ?F (36.7 ?C)  ?SpO2: 97%  ?Weight: 189 lb 9.6 oz (86 kg)  ? ?Body mass index is 32.54 kg/m?. ? ?Advanced Directives 12/23/2021 05/10/2021 12/17/2020 07/16/2020 11/14/2019 08/26/2018 06/30/2018  ?Does Patient Have a Medical Advance Directive? Yes No Yes Yes Yes Yes Yes  ?Type of AAcademic librarian- Healthcare Power of [place] will Healthcare Power of Attorney Living will;Healthcare Power of [place] will [place] will  ?Does patient want to make changes to medical advance directive? - - - - No - Patient declined No - Patient declined -  ?Copy of [place]? No - copy requested - No - copy requested - No - copy requested Yes - validated most recent copy scanned in chart (See row information) -  ?Would patient like information on creating a medical advance directive? - - - No - Patient declined - - -  ?Pre-existing out of facility DNR order (yellow form or pink MOST form) - - - - - - -  ? ? ?Current Medications (verified) ?Outpatient Encounter Medications as of 12/23/2021  ?Medication Sig  ? acetaminophen (TYLENOL) 500 MG tablet Take 1,000 mg by mouth every 6 (six) hours as needed for mild pain or moderate pain.  ? Calcium Carbonate-Vitamin D (CALCIUM + D PO) Take 1 tablet by mouth at bedtime.  ? diltiazem (CARDIZEM CD) 240 MG 24 hr capsule TAKE 1 CAPSULE(240 MG) BY MOUTH DAILY  ? ELIQUIS 5 MG TABS tablet TAKE 1 TABLET(5 MG) BY MOUTH TWICE DAILY  ? Multiple Vitamin (MULTIVITAMIN WITH MINERALS) TABS tablet Take 1 tablet by mouth at bedtime.  ?  nitroGLYCERIN (NITROSTAT) 0.4 MG SL tablet Place 1 tablet (0.4 mg total) under the tongue as needed.  ? polyethylene glycol (MIRALAX / GLYCOLAX) 17 g packet Take 17 g by mouth daily.  ? Psyllium 500 MG CAPS Take 2 capsules by mouth daily.  ? [DISCONTINUED] omeprazole (PRILOSEC) 40 MG capsule Take 1 capsule (40 mg total) by mouth daily. (Patient not taking: Reported on 12/02/2021)  ? [DISCONTINUED] rosuvastatin (CRESTOR) 5 MG tablet TAKE 1 TABLET(5 MG) BY MOUTH DAILY (Patient not taking: Reported on 12/02/2021)  ? ?No facility-administered encounter medications on file as of 12/23/2021.  ? ? ?Allergies (verified) ?Shellfish allergy  ? ?History: ?Past Medical History:  ?Diagnosis Date  ? Arthritis   ? back, left shoulder, and fingers  ? Atrial fibrillation ([place]   ? History of chicken pox   ? Hyperlipidemia   ? Hypertension   ? Osteoporosis   ? Stroke ([facility]   ? ?Past Surgical History:  ?Procedure Laterality Date  ? CATARACT EXTRACTION    ? KNEE ARTHROSCOPY Right 07/04/2015  ? Procedure: RIGHT KNEE ARTHROSCOPY WITH MEDIAL MENISCAL DEBRIDEMENT AND CHONDROPLASTY;  Surgeon: FGaynelle Arabian MD;  [facility];  Service: Orthopedics;  Laterality: Right;  ? OPEN REDUCTION INTERNAL FIXATION (ORIF) DISTAL RADIAL FRACTURE Right 12/28/2012  ? Procedure: OPEN REDUCTION INTERNAL FIXATION (ORIF) DISTAL RADIAL FRACTURE;  Surgeon: PHessie Dibble MD;  [place]  Service: Orthopedics;  Laterality: Right;  ? VAGINAL HYSTERECTOMY    ? ?  Family History  ?Problem Relation Age of Onset  ? Stroke Mother 32  ? Anemia Mother   ? Heart disease Mother   ? Hearing loss Mother   ? Heart attack Mother   ? Hypertension Mother   ? Stroke Father 52  ? Early death Father   ? Hypertension Father   ? Stroke Sister 13  ? Diabetes Sister   ? Hypertension Sister   ? Hypertension Son   ? ?Social History  ? ?Socioeconomic History  ? Marital status: Widowed  ?  Spouse name: Not on file  ? Number of children: 3  ? Years of education:  Not on file  ? Highest education level: Not on file  ?Occupational History  ? Occupation: Retired  ?  Employer: RETIRED  ?  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 Smoking status: Former  ?  Types: Cigarettes  ?  Quit date: 12/17/1986  ?  Years since quitting: 35.0  ? Smokeless tobacco: Never  ? Tobacco comments:  ?  Smoke briefly in the distant past  ?Vaping Use  ? Vaping Use: Never used  ?Substance and Sexual Activity  ? Alcohol use: No  ? Drug use: No  ? Sexual activity: Not Currently  ?Other Topics Concern  ? Not on file  ?Social History Narrative  ? Marg lives at home alone since her husband passed in April 2019. She has three sons and three grandsons.None live locally but she gets to talk with them often and sees them pretty regularly. 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  She enjoys having lunch out with friends regularly. She plans to move to a  ? ?Social Determinants of Health  ? ?Financial Resource Strain: Low Risk   ? Difficulty of Paying Living Expenses: Not hard at all  ?Food Insecurity: No Food Insecurity  ? Worried About Charity fundraiser in the Last Year: Never true  ? Ran Out of Food in the Last Year: Never true  ?Transportation Needs: No Transportation Needs  ? Lack of Transportation (Medical): No  ? Lack of Transportation (Non-Medical): No  ?Physical Activity: Inactive  ? Days of Exercise per Week: 0 days  ? Minutes of Exercise per Session: 0 min  ?Stress: No Stress Concern Present  ? Feeling of Stress : Not at all  ?Social Connections: Moderately Integrated  ? Frequency of Communication with Friends and Family: More than three times a week  ? Frequency of Social Gatherings with Friends and Family: More than three times a week  ? Attends Religious Services: More than 4 times per year  ? Active  Member of Clubs or Organizations: Yes  ? Attends Archivist Meetings: 1 to 4 times per year  ? Marital Status: Widowed  ? ? ?Tobacco Counseling ?Counseling given: Not Answered ?Tobacco comments: Smoke briefly in the distant past ? ? ?Clinical Intake: ? ?Pre-visit preparation completed: Yes ? ?Pain : No/denies pain ? ?  ? ?BMI - recorded: 32.54 ?Nutritional Status: BMI > 30  Obese ?Nutritional Risks: None ?Diabetes: No ? ?How often do you need to have someone help you when you read instructions, pamphlets, or other written materials from your doctor or pharmacy?: 1 - Never ? ?Diabetic?No ? ?Interpreter Needed?: No ? ?Information entered by ::  Charlott Rakes, LPN ? ? ?Activities of Daily Living ?In your present state of health, do you have any difficulty performing the following activities: 12/23/2021  ?Hearing? N  ?Vision? N  ?Difficulty concentrating or making decisions? N  ?Walking or climbing stairs? Y  ?Comment bad knee  ?Dressing or bathing? N  ?Doing errands, shopping? N  ?Preparing Food and eating ? N  ?Using the Toilet? N  ?In the past six months, have you accidently leaked urine? N  ?Do you have problems with loss of bowel control? Y  ?Comment leaking of stool at times  ?Managing your Medications? N  ?Managing your Finances? N  ?Housekeeping or managing your Housekeeping? N  ?Some recent data might be hidden  ? ? ?Patient Care Team: ?Vivi Barrack, MD as PCP - General (Family Medicine) ?Minus Breeding, MD as PCP - Cardiology (Cardiology) ?Gaynelle Arabian, MD as Consulting Physician (Orthopedic Surgery) ?Minus Breeding, MD as Consulting Physician (Cardiology) ? ?Indicate any recent Medical Services you may have received from other than [facility] providers in the past year (date may be approximate). ? ?   ?Assessment:  ? This is a routine wellness examination for Elany. ? ?Hearing/Vision screen ?Hearing Screening - Comments:: Pt denies any hearing issues  ?Vision Screening - Comments:: Pt follows up  wit Dr Katy Fitch for annual eye exams  ? ?Dietary issues and exercise activities discussed: ?Current Exercise Habits: The patient does not participate in regular exercise at present ? ? Goals Addressed   ? ?  ?  ?  ?

## 2021-12-23 NOTE — Assessment & Plan Note (Signed)
Had extensive discussion with patient regarding her recent echocardiogram.  Mild to moderate on most recent echo though she is still having a fair amount of exertional dyspnea.  Her recent labs were normal.  Advised her to follow-up with cardiology soon. Discussed reasons to return to care and seek emergent care.  ?

## 2021-12-23 NOTE — Patient Instructions (Signed)
Nancy Mccormick , ?Thank you for taking time to come for your Medicare Wellness Visit. I appreciate your ongoing commitment to your health goals. Please review the following plan we discussed and let me know if I can assist you in the future.  ? ?Screening recommendations/referrals: ?Colonoscopy: No longer required  ?Mammogram: Done 02/21/21 repeat every year  ?Bone Density: Done 11/17/16  ?Recommended yearly ophthalmology/optometry visit for glaucoma screening and checkup ?Recommended yearly dental visit for hygiene and checkup ? ?Vaccinations: ?Influenza vaccine: Discontinue  ?Pneumococcal vaccine: Up to date ?Tdap vaccine: Done 08/26/18 ?Shingles vaccine: Discontinue  ?Covid-19:Completed 1/20, 2/10, 08/24/20 & 05/01/21 ? ?Advanced directives: Please bring a copy of your health care power of attorney and living will to the office at your convenience. ? ?Conditions/risks identified: Get back to feeling better  ? ?Next appointment: Follow up in one year for your annual wellness visit  ? ? ?Preventive Care 22 Years and Older, Female ?Preventive care refers to lifestyle choices and visits with your health care provider that can promote health and wellness. ?What does preventive care include? ?A yearly physical exam. This is also called an annual well check. ?Dental exams once or twice a year. ?Routine eye exams. Ask your health care provider how often you should have your eyes checked. ?Personal lifestyle choices, including: ?Daily care of your teeth and gums. ?Regular physical activity. ?Eating a healthy diet. ?Avoiding tobacco and drug use. ?Limiting alcohol use. ?Practicing safe sex. ?Taking low-dose aspirin every day. ?Taking vitamin and mineral supplements as recommended by your health care provider. ?What happens during an annual well check? ?The services and screenings done by your health care provider during your annual well check will depend on your age, overall health, lifestyle risk factors, and family history of  disease. ?Counseling  ?Your health care provider may ask you questions about your: ?Alcohol use. ?Tobacco use. ?Drug use. ?Emotional well-being. ?Home and relationship well-being. ?Sexual activity. ?Eating habits. ?History of falls. ?Memory and ability to understand (cognition). ?Work and work Statistician. ?Reproductive health. ?Screening  ?You may have the following tests or measurements: ?Height, weight, and BMI. ?Blood pressure. ?Lipid and cholesterol levels. These may be checked every 5 years, or more frequently if you are over 54 years old. ?Skin check. ?Lung cancer screening. You may have this screening every year starting at age 50 if you have a 30-pack-year history of smoking and currently smoke or have quit within the past 15 years. ?Fecal occult blood test (FOBT) of the stool. You may have this test every year starting at age 12. ?Flexible sigmoidoscopy or colonoscopy. You may have a sigmoidoscopy every 5 years or a colonoscopy every 10 years starting at age 33. ?Hepatitis C blood test. ?Hepatitis B blood test. ?Sexually transmitted disease (STD) testing. ?Diabetes screening. This is done by checking your blood sugar (glucose) after you have not eaten for a while (fasting). You may have this done every 1-3 years. ?Bone density scan. This is done to screen for osteoporosis. You may have this done starting at age 83. ?Mammogram. This may be done every 1-2 years. Talk to your health care provider about how often you should have regular mammograms. ?Talk with your health care provider about your test results, treatment options, and if necessary, the need for more tests. ?Vaccines  ?Your health care provider may recommend certain vaccines, such as: ?Influenza vaccine. This is recommended every year. ?Tetanus, diphtheria, and acellular pertussis (Tdap, Td) vaccine. You may need a Td booster every 10 years. ?Zoster vaccine.  You may need this after age 69. ?Pneumococcal 13-valent conjugate (PCV13) vaccine. One  dose is recommended after age 78. ?Pneumococcal polysaccharide (PPSV23) vaccine. One dose is recommended after age 91. ?Talk to your health care provider about which screenings and vaccines you need and how often you need them. ?This information is not intended to replace advice given to you by your health care provider. Make sure you discuss any questions you have with your health care provider. ?Document Released: 10/19/2015 Document Revised: 06/11/2016 Document Reviewed: 07/24/2015 ?Elsevier Interactive Patient Education ? 2017 [place]. ? ?Fall Prevention in the Home ?Falls can cause injuries. They can happen to people of all ages. There are many things you can do to make your home safe and to help prevent falls. ?What can I do on the outside of my home? ?Regularly fix the edges of walkways and driveways and fix any cracks. ?Remove anything that might make you trip as you walk through a door, such as a raised step or threshold. ?Trim any bushes or trees on the path to your home. ?Use bright outdoor lighting. ?Clear any walking paths of anything that might make someone trip, such as rocks or tools. ?Regularly check to see if handrails are loose or broken. Make sure that both sides of any steps have handrails. ?Any raised decks and porches should have guardrails on the edges. ?Have any leaves, snow, or ice cleared regularly. ?Use sand or salt on walking paths during winter. ?Clean up any spills in your garage right away. This includes oil or grease spills. ?What can I do in the bathroom? ?Use night lights. ?Install grab bars by the toilet and in the tub and shower. Do not use towel bars as grab bars. ?Use non-skid mats or decals in the tub or shower. ?If you need to sit down in the shower, use a plastic, non-slip stool. ?Keep the floor dry. Clean up any water that spills on the floor as soon as it happens. ?Remove soap buildup in the tub or shower regularly. ?Attach bath mats securely with double-sided  non-slip rug tape. ?Do not have throw rugs and other things on the floor that can make you trip. ?What can I do in the bedroom? ?Use night lights. ?Make sure that you have a light by your bed that is easy to reach. ?Do not use any sheets or blankets that are too big for your bed. They should not hang down onto the floor. ?Have a firm chair that has side arms. You can use this for support while you get dressed. ?Do not have throw rugs and other things on the floor that can make you trip. ?What can I do in the kitchen? ?Clean up any spills right away. ?Avoid walking on wet floors. ?Keep items that you use a lot in easy-to-reach places. ?If you need to reach something above you, use a strong step stool that has a grab bar. ?Keep electrical cords out of the way. ?Do not use floor polish or wax that makes floors slippery. If you must use wax, use non-skid floor wax. ?Do not have throw rugs and other things on the floor that can make you trip. ?What can I do with my stairs? ?Do not leave any items on the stairs. ?Make sure that there are handrails on both sides of the stairs and use them. Fix handrails that are broken or loose. Make sure that handrails are as long as the stairways. ?Check any carpeting to make sure that it is  firmly attached to the stairs. Fix any carpet that is loose or worn. ?Avoid having throw rugs at the top or bottom of the stairs. If you do have throw rugs, attach them to the floor with carpet tape. ?Make sure that you have a light switch at the top of the stairs and the bottom of the stairs. If you do not have them, ask someone to add them for you. ?What else can I do to help prevent falls? ?Wear shoes that: ?Do not have high heels. ?Have rubber bottoms. ?Are comfortable and fit you well. ?Are closed at the toe. Do not wear sandals. ?If you use a stepladder: ?Make sure that it is fully opened. Do not climb a closed stepladder. ?Make sure that both sides of the stepladder are locked into place. ?Ask  someone to hold it for you, if possible. ?Clearly mark and make sure that you can see: ?Any grab bars or handrails. ?First and last steps. ?Where the edge of each step is. ?Use tools that help you move around

## 2021-12-23 NOTE — Progress Notes (Signed)
? ?  Nancy Mccormick is a 88 y.o. female who presents today for an office visit. ? ?Assessment/Plan:  ?Chronic Problems Addressed Today: ?Aortic valve regurgitation ?Had extensive discussion with patient regarding her recent echocardiogram.  Mild to moderate on most recent echo though she is still having a fair amount of exertional dyspnea.  Her recent labs were normal.  Advised her to follow-up with cardiology soon. Discussed reasons to return to care and seek emergent care.  ? ? ?  ?Subjective:  ?HPI: ? ?Patient here for follow-up.  She was seen about a month ago for fatigue and exertional dyspnea.  Had labs done which were essentially normal.  Recently had an echocardiogram done last week which did show mild to moderate aortic regurgitation.  Her symptoms have not changed much since her last visit.  She does not have any symptoms at rest.  No chest pain.  No nausea or vomiting. ? ?   ?  ?Objective:  ?Physical Exam: ?Pulse 64   Temp 98.3 ?F (36.8 ?C) (Temporal)   Ht '5\' 4"'$  (1.626 m)   Wt 189 lb 9.6 oz (86 kg)   SpO2 98%   BMI 32.54 kg/m?   ?Gen: No acute distress, resting comfortably ?CV: Irregular with 2/6 systolic murmur appreciated ?Pulm: Normal work of breathing, clear to auscultation bilaterally with no crackles, wheezes, or rhonchi ?Neuro: Grossly normal, moves all extremities ?Psych: Normal affect and thought content ? ?   ? ?Algis Greenhouse. Jerline Pain, MD ?12/23/2021 3:48 PM  ? ?

## 2021-12-23 NOTE — Assessment & Plan Note (Signed)
Follows with cardiology.  Anticoagulated on Eliquis and rate controlled on diltiazem. ?

## 2021-12-25 NOTE — Progress Notes (Signed)
?Cardiology Office Note:   ? ?Date:  12/26/2021  ? ?ID:  Nancy Mccormick, DOB [birth year], MRN [id number] ? ?PCP:  Nancy Barrack, MD ?  ?[facility] Providers ?Cardiologist:  Nancy Breeding, MD    ? ?Referring MD: Nancy Barrack, MD  ? ?Eval of dyspnea, fatigue and newly diagnosed mild to moderate AI.  ? ?History of Present Illness:   ? ?Nancy Mccormick is a 88 y.o. female with a hx of HTN, HLD, PAF on Eliquis, and previous CVA who presents to clinic for evaluation of newly diagnosed AI as well worsening dyspnea and fatigue.  ? ?Last seen by Dr. Percival Mccormick in 10/2021 and doing well.  ? ?Seen by PCP recently for fatigue and exertional dyspnea.  Had labs done which were essentially normal.  Updated echo 12/20/21 showed EF 60-65%, G1DD with elevated LVEDP, mild MR, mild to moderate AR with aortic valve sclerosis & calcifications present. PHT 782 Nancy.  ? ?She was referred back to cardiology for follow up. She had a Gi virus in Feb with mostly diarrhea and stomach cramping. Since that time she has had a persistent headache and shortness of breath as well as debilitating fatigue. After she takes a shower she has to lay down. She is a very active lady usually and now she cannot complete her daily living due to her symptoms. She gets very short of breath with minimal activity. She is very emotional about this in the office today because she just wants to feel better. She has some mild LE edema and orthopnea as well as abdominal distension that she has attributed to bowel changes/constipation after recent GI illness. No chest pain.  ? ?Past Medical History:  ?Diagnosis Date  ? Arthritis   ? back, left shoulder, and fingers  ? Atrial fibrillation ([place])   ? History of chicken pox   ? Hyperlipidemia   ? Hypertension   ? Osteoporosis   ? Stroke [facility])   ? ? ?Past Surgical History:  ?Procedure Laterality Date  ? CATARACT EXTRACTION    ? KNEE ARTHROSCOPY Right 07/04/2015  ? Procedure: RIGHT KNEE ARTHROSCOPY WITH MEDIAL MENISCAL  DEBRIDEMENT AND CHONDROPLASTY;  Surgeon: Nancy Arabian, MD;  Location: [facility];  Service: Orthopedics;  Laterality: Right;  ? OPEN REDUCTION INTERNAL FIXATION (ORIF) DISTAL RADIAL FRACTURE Right 12/28/2012  ? Procedure: OPEN REDUCTION INTERNAL FIXATION (ORIF) DISTAL RADIAL FRACTURE;  Surgeon: Nancy Dibble, MD;  Location: [place];  Service: Orthopedics;  Laterality: Right;  ? VAGINAL HYSTERECTOMY    ? ? ?Current Medications: ?Current Meds  ?Medication Sig  ? acetaminophen (TYLENOL) 500 MG tablet Take 1,000 mg by mouth every 6 (six) hours as needed for mild pain or moderate pain.  ? Calcium Carbonate-Vitamin D (CALCIUM + D PO) Take 1 tablet by mouth at bedtime.  ? diltiazem (CARDIZEM CD) 240 MG 24 hr capsule TAKE 1 CAPSULE(240 MG) BY MOUTH DAILY  ? ELIQUIS 5 MG TABS tablet TAKE 1 TABLET(5 MG) BY MOUTH TWICE DAILY  ? furosemide (LASIX) 20 MG tablet TAKE 40 MG (2 TABLETS) FOR 3 DAYS, THEN DECREASE TO 20 MG DAILY  ? Multiple Vitamin (MULTIVITAMIN WITH MINERALS) TABS tablet Take 1 tablet by mouth at bedtime.  ? nitroGLYCERIN (NITROSTAT) 0.4 MG SL tablet Place 1 tablet (0.4 mg total) under the tongue as needed.  ? polyethylene glycol (MIRALAX / GLYCOLAX) 17 g packet Take 17 g by mouth daily.  ? Psyllium 500 MG CAPS Take 2 capsules by mouth daily.  ?  ? ?  Allergies:   Shellfish allergy  ? ?Social History  ? ?Socioeconomic History  ? Marital status: Widowed  ?  Spouse name: Not on file  ? Number of children: 3  ? Years of education: Not on file  ? Highest education level: Not on file  ?Occupational History  ? Occupation: Retired  ?  Employer: RETIRED  ?  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 Smoking status: Former  ?  Types: Cigarettes  ?  Quit date: 12/17/1986  ?  Years since quitting: 35.0  ? Smokeless tobacco: Never  ? Tobacco comments:  ?  Smoke briefly in the distant past  ?Vaping Use  ? Vaping Use: Never used  ?Substance and Sexual Activity  ? Alcohol use: No  ? Drug use: No  ? Sexual activity:  Not Currently  ?Other Topics Concern  ? Not on file  ?Social History Narrative  ? Nancy Mccormick lives at home alone since her husband passed in April 2019. She has three sons and three grandsons.None live locally but she gets to talk with them often and sees them pretty regularly. 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  She enjoys having lunch out with friends regularly. She plans to move to a  ? ?Social Determinants of Health  ? ?Financial Resource Strain: Low Risk   ? Difficulty of Paying Living Expenses: Not hard at all  ?Food Insecurity: No Food Insecurity  ? Worried About Charity fundraiser in the Last Year: Never true  ? Ran Out of Food in the Last Year: Never true  ?Transportation Needs: No Transportation Needs  ? Lack of Transportation (Medical): No  ? Lack of Transportation (Non-Medical): No  ?Physical Activity: Inactive  ? Days of Exercise per Week: 0 days  ? Minutes of Exercise per Session: 0 min  ?Stress: No Stress Concern Present  ? Feeling of Stress : Not at all  ?Social Connections: Moderately Integrated  ? Frequency of Communication with Friends and Family: More than three times a week  ? Frequency of Social Gatherings with Friends and Family: More than three times a week  ? Attends Religious Services: More than 4 times per year  ? Active Member of Clubs or Organizations: Yes  ? Attends Archivist Meetings: 1 to 4 times per year  ? Marital Status: Widowed  ?  ? ?Family History: ?The patient's family history includes Anemia in her mother; Diabetes in her sister; Early death in her father; Hearing loss in her mother; Heart attack in her mother; Heart disease in her mother; Hypertension in her father, mother, sister, and son; Stroke (age of onset: 36) in her father; Stroke (age of onset: 5) in her  sister; Stroke (age of onset: 51) in her mother. ? ?ROS:   ?Please see the history of present illness.    ? All other systems reviewed and are negative. ? ?EKGs/Labs/Other Studies Reviewed:   ? ?The following studies were reviewed today: ? ?12/20/21 Echo ?IMPRESSIONS  ? 1. Left ventricular ejection fraction, by estimation, is 60 to 65%. The  ?left ventricle has normal function. The left ventricle has no regional  ?wall motion abnormalities. There is mild concentric left ventricular  ?hypertrophy. Left ventricular diastolic  ?parameters are consistent with Grade I diastolic dysfunction (impaired  ?  relaxation). Elevated left ventricular end-diastolic pressure.  ? 2. Right ventricular systolic function is normal. The right ventricular  ?size is normal. Tricuspid regurgitation signal is inadequate for assessing  ?PA pressure.  ? 3. The mitral valve is normal in structure. Mild mitral valve  ?regurgitation. No evidence of mitral stenosis.  ? 4. The aortic valve is tricuspid. Aortic valve regurgitation is mild to  ?moderate. Aortic valve sclerosis/calcification is present, without any  ?evidence of aortic stenosis. Aortic regurgitation PHT measures 782 msec.  ? 5. Aortic dilatation noted. There is borderline dilatation of the  ?ascending aorta, measuring 37 mm.  ? 6. The inferior vena cava is normal in size with greater than 50%  ?respiratory variability, suggesting right atrial pressure of 3 mmHg.  ? ?EKG:  EKG is ordered today.  The ekg ordered today demonstrates sinus with PACs, HR 81 ? ?Recent Labs: ?12/02/2021: ALT 28; BUN 13; Creatinine, Ser 0.69; Hemoglobin 12.5; Platelets 336.0; Potassium 3.9; Sodium 141; TSH 3.23  ?Recent Lipid Panel ?   ?Component Value Date/Time  ? CHOL 184 10/31/2020 0752  ? CHOL 203 (H) 03/28/2019 0936  ? CHOL 129 03/31/2013 0826  ? TRIG 181.0 (H) 10/31/2020 0752  ? TRIG 147 04/22/2016 1232  ? TRIG 87 03/31/2013 0826  ? HDL 69.20 10/31/2020 0752  ? HDL 78 03/28/2019 0936  ? HDL 69 04/22/2016  1232  ? HDL 56 03/31/2013 0826  ? CHOLHDL 3 10/31/2020 0752  ? VLDL 36.2 10/31/2020 0752  ? [place] 78 10/31/2020 0752  ? [place] 93 03/28/2019 0936  ? LDLCALC 135 (H) 06/15/2014 1247  ? LDLCALC 56 03/31/2013 082

## 2021-12-26 ENCOUNTER — Ambulatory Visit: Payer: Medicare HMO | Admitting: Physician Assistant

## 2021-12-26 ENCOUNTER — Other Ambulatory Visit: Payer: Self-pay

## 2021-12-26 ENCOUNTER — Encounter: Payer: Self-pay | Admitting: Physician Assistant

## 2021-12-26 VITALS — BP 142/90 | HR 81 | Ht 64.0 in | Wt 189.0 lb

## 2021-12-26 DIAGNOSIS — R5383 Other fatigue: Secondary | ICD-10-CM

## 2021-12-26 DIAGNOSIS — I480 Paroxysmal atrial fibrillation: Secondary | ICD-10-CM

## 2021-12-26 DIAGNOSIS — I10 Essential (primary) hypertension: Secondary | ICD-10-CM

## 2021-12-26 DIAGNOSIS — E785 Hyperlipidemia, unspecified: Secondary | ICD-10-CM | POA: Diagnosis not present

## 2021-12-26 DIAGNOSIS — I351 Nonrheumatic aortic (valve) insufficiency: Secondary | ICD-10-CM

## 2021-12-26 DIAGNOSIS — R0602 Shortness of breath: Secondary | ICD-10-CM | POA: Diagnosis not present

## 2021-12-26 DIAGNOSIS — R0600 Dyspnea, unspecified: Secondary | ICD-10-CM

## 2021-12-26 MED ORDER — FUROSEMIDE 20 MG PO TABS: ORAL_TABLET | ORAL | 3 refills | Status: DC

## 2021-12-26 NOTE — Patient Instructions (Signed)
Medication Instructions:  ?Your physician has recommended you make the following change in your medication:  ?START LASIX 40 MG FOR 3 DAYS, THEN DECREASE TO 20 MG DAILY.  ?*If you need a refill on your cardiac medications before your next appointment, please call your pharmacy* ? ? ?Lab Work: ?TODAY: BNP, BMET ?If you have labs (blood work) drawn today and your tests are completely normal, you will receive your results only by: ?MyChart Message (if you have MyChart) OR ?A paper copy in the mail ?If you have any lab test that is abnormal or we need to change your treatment, we will call you to review the results. ? ? ?Testing/Procedures: ?Your physician has requested that you have a lexiscan myoview. For further information please visit [email or web address]. Please follow instruction sheet, as given.  ? ?Non-Cardiac CT Angiography (CTA), is a special type of CT scan that uses a computer to produce multi-dimensional views of major blood vessels throughout the body. In CT angiography, a contrast material is injected through an IV to help visualize the blood vessels ? ? ?Follow-Up: ?At [facility], you and your health needs are our priority.  As part of our continuing mission to provide you with exceptional heart care, we have created designated Provider Care Teams.  These Care Teams include your primary Cardiologist (physician) and Advanced Practice Providers (APPs -  Physician Assistants and Nurse Practitioners) who all work together to provide you with the care you need, when you need it. ? ?We recommend signing up for the patient portal called "MyChart".  Sign up information is provided on this After Visit Summary.  MyChart is used to connect with patients for Virtual Visits (Telemedicine).  Patients are able to view lab/test results, encounter notes, upcoming appointments, etc.  Non-urgent messages can be sent to your provider as well.   ?To learn more about what you can do with MyChart, go to  [email or web address].   ? ?Your next appointment:   ?KEEP SCHEDULED FOLLOW-UP ?

## 2021-12-27 ENCOUNTER — Ambulatory Visit (HOSPITAL_COMMUNITY): Payer: Medicare HMO | Attending: Cardiovascular Disease

## 2021-12-27 ENCOUNTER — Ambulatory Visit (INDEPENDENT_AMBULATORY_CARE_PROVIDER_SITE_OTHER)
Admission: RE | Admit: 2021-12-27 | Discharge: 2021-12-27 | Disposition: A | Discharge status: Home / Self Care | Payer: Medicare HMO | Source: Ambulatory Visit | Attending: Physician Assistant | Admitting: Physician Assistant

## 2021-12-27 DIAGNOSIS — R0602 Shortness of breath: Secondary | ICD-10-CM

## 2021-12-27 LAB — BASIC METABOLIC PANEL
BUN/Creatinine Ratio: 22 (ref 12–28)
BUN: 17 mg/dL (ref 8–27)
CO2: 23 mmol/L (ref 20–29)
Calcium: 9.4 mg/dL (ref 8.7–10.3)
Chloride: 105 mmol/L (ref 96–106)
Creatinine, Ser: 0.77 mg/dL (ref 0.57–1.00)
Glucose: 121 mg/dL — ABNORMAL HIGH (ref 70–99)
Potassium: 4.6 mmol/L (ref 3.5–5.2)
Sodium: 142 mmol/L (ref 134–144)
eGFR: 76 mL/min/{1.73_m2} (ref >59)

## 2021-12-27 LAB — MYOCARDIAL PERFUSION IMAGING
Base ST Depression (mm): 0 mm
LV dias vol: 91 mL (ref 46–106)
LV sys vol: 43 mL
Nuc Stress EF: 53 %
Peak HR: 100 {beats}/min
Rest HR: 71 {beats}/min
Rest Nuclear Isotope Dose: 10.2 mCi
SDS: 0
SRS: 2
SSS: 2
ST Depression (mm): 0 mm
Stress Nuclear Isotope Dose: 30.4 mCi
TID: 0.91

## 2021-12-27 LAB — PRO B NATRIURETIC PEPTIDE: NT-Pro BNP: 184 pg/mL (ref 0–738)

## 2021-12-27 MED ORDER — TECHNETIUM TC 99M TETROFOSMIN IV KIT
10.2000 | PACK | Freq: Once | INTRAVENOUS | Status: AC | PRN
  Administered 2021-12-27: 10.2 via INTRAVENOUS
  Filled 2021-12-27: qty 11

## 2021-12-27 MED ORDER — REGADENOSON 0.4 MG/5ML IV SOLN
0.4000 mg | Freq: Once | INTRAVENOUS | Status: AC
  Administered 2021-12-27: 0.4 mg via INTRAVENOUS

## 2021-12-27 MED ORDER — IOHEXOL 350 MG/ML SOLN
75.0000 mL | Freq: Once | INTRAVENOUS | Status: AC | PRN
  Administered 2021-12-27: 75 mL via INTRAVENOUS

## 2021-12-27 MED ORDER — TECHNETIUM TC 99M TETROFOSMIN IV KIT
30.4000 | PACK | Freq: Once | INTRAVENOUS | Status: AC | PRN
  Administered 2021-12-27: 30.4 via INTRAVENOUS
  Filled 2021-12-27: qty 31

## 2021-12-30 ENCOUNTER — Telehealth: Payer: Self-pay | Admitting: Physician Assistant

## 2021-12-30 DIAGNOSIS — I480 Paroxysmal atrial fibrillation: Secondary | ICD-10-CM

## 2021-12-30 DIAGNOSIS — R0600 Dyspnea, unspecified: Secondary | ICD-10-CM

## 2021-12-30 NOTE — Telephone Encounter (Signed)
Patient is calling back to get results from Friday ?

## 2021-12-31 NOTE — Telephone Encounter (Signed)
?  All of her testing has been normal thus far. The only thing I can think of doing is a CPX. I have arranged an apt with Dr. Percival Mccormick in his first available. If she wants a CPX, we can get that arranged.  ?We can cancel the apt with me bc its only a week away from the one with Dr. Percival Mccormick. Lets also get a BMET since she is on lasix.  ? ?Thanks.  ? ?KT   ?  ?   ? ? ?Canceled appointment with Nancy Range PA, and patient will follow up with Dr. Percival Mccormick. Patient will think about having CPX, and she will call back if she decides to do that test. Patient will come in next week for BMET. Patient verbalized understanding. ?

## 2021-12-31 NOTE — Telephone Encounter (Signed)
Patient called in to triage to get results of CT angio and nuclear stress test. Informed patient that CT angio was negative for pulmonary embolism and nuclear stress test was normal. She asked if she needed to keep 4/28 appointment with Sandria Senter, PA and Dr. Warren Lacy on May 3. She said it didn't matter who she needed to see, or if both. Please advise. ?

## 2022-01-01 ENCOUNTER — Emergency Department (HOSPITAL_BASED_OUTPATIENT_CLINIC_OR_DEPARTMENT_OTHER): Payer: Medicare HMO | Admitting: Radiology

## 2022-01-01 ENCOUNTER — Other Ambulatory Visit: Payer: Self-pay

## 2022-01-01 ENCOUNTER — Telehealth: Payer: Self-pay | Admitting: Family Medicine

## 2022-01-01 ENCOUNTER — Emergency Department (HOSPITAL_BASED_OUTPATIENT_CLINIC_OR_DEPARTMENT_OTHER): Payer: Medicare HMO

## 2022-01-01 ENCOUNTER — Emergency Department (HOSPITAL_BASED_OUTPATIENT_CLINIC_OR_DEPARTMENT_OTHER)
Admission: EM | Admit: 2022-01-01 | Discharge: 2022-01-01 | Disposition: A | Discharge status: Home / Self Care | Payer: Medicare HMO | Attending: Emergency Medicine | Admitting: Emergency Medicine

## 2022-01-01 DIAGNOSIS — Z20822 Contact with and (suspected) exposure to covid-19: Secondary | ICD-10-CM | POA: Diagnosis not present

## 2022-01-01 DIAGNOSIS — R5383 Other fatigue: Secondary | ICD-10-CM | POA: Insufficient documentation

## 2022-01-01 DIAGNOSIS — N390 Urinary tract infection, site not specified: Secondary | ICD-10-CM | POA: Diagnosis not present

## 2022-01-01 DIAGNOSIS — R0609 Other forms of dyspnea: Secondary | ICD-10-CM | POA: Diagnosis not present

## 2022-01-01 DIAGNOSIS — Z7901 Long term (current) use of anticoagulants: Secondary | ICD-10-CM | POA: Diagnosis not present

## 2022-01-01 DIAGNOSIS — R059 Cough, unspecified: Secondary | ICD-10-CM | POA: Diagnosis not present

## 2022-01-01 DIAGNOSIS — I10 Essential (primary) hypertension: Secondary | ICD-10-CM | POA: Insufficient documentation

## 2022-01-01 DIAGNOSIS — R531 Weakness: Secondary | ICD-10-CM | POA: Insufficient documentation

## 2022-01-01 LAB — RESP PANEL BY RT-PCR (FLU A&B, COVID) ARPGX2
Influenza A by PCR: NEGATIVE
Influenza B by PCR: NEGATIVE
SARS Coronavirus 2 by RT PCR: NEGATIVE

## 2022-01-01 LAB — COMPREHENSIVE METABOLIC PANEL
ALT: 20 U/L (ref 0–44)
AST: 18 U/L (ref 15–41)
Albumin: 4.1 g/dL (ref 3.5–5.0)
Alkaline Phosphatase: 86 U/L (ref 38–126)
Anion gap: 11 (ref 5–15)
BUN: 19 mg/dL (ref 8–23)
CO2: 25 mmol/L (ref 22–32)
Calcium: 9.4 mg/dL (ref 8.9–10.3)
Chloride: 105 mmol/L (ref 98–111)
Creatinine, Ser: 0.7 mg/dL (ref 0.44–1.00)
GFR, Estimated: >60 mL/min (ref >60)
Glucose, Bld: 103 mg/dL — ABNORMAL HIGH (ref 70–99)
Potassium: 3.8 mmol/L (ref 3.5–5.1)
Sodium: 141 mmol/L (ref 135–145)
Total Bilirubin: 0.6 mg/dL (ref 0.3–1.2)
Total Protein: 7.2 g/dL (ref 6.5–8.1)

## 2022-01-01 LAB — CBC WITH DIFFERENTIAL/PLATELET
Abs Immature Granulocytes: 0.03 10*3/uL (ref 0.00–0.07)
Basophils Absolute: 0.1 10*3/uL (ref 0.0–0.1)
Basophils Relative: 1 %
Eosinophils Absolute: 0.3 10*3/uL (ref 0.0–0.5)
Eosinophils Relative: 3 %
HCT: 41.6 % (ref 36.0–46.0)
Hemoglobin: 13.4 g/dL (ref 12.0–15.0)
Immature Granulocytes: 0 %
Lymphocytes Relative: 37 %
Lymphs Abs: 3.6 10*3/uL (ref 0.7–4.0)
MCH: 28.8 pg (ref 26.0–34.0)
MCHC: 32.2 g/dL (ref 30.0–36.0)
MCV: 89.5 fL (ref 80.0–100.0)
Monocytes Absolute: 0.9 10*3/uL (ref 0.1–1.0)
Monocytes Relative: 9 %
Neutro Abs: 4.8 10*3/uL (ref 1.7–7.7)
Neutrophils Relative %: 50 %
Platelets: 327 10*3/uL (ref 150–400)
RBC: 4.65 MIL/uL (ref 3.87–5.11)
RDW: 14.5 % (ref 11.5–15.5)
WBC: 9.7 10*3/uL (ref 4.0–10.5)
nRBC: 0 % (ref 0.0–0.2)

## 2022-01-01 LAB — TROPONIN I (HIGH SENSITIVITY): Troponin I (High Sensitivity): 5 ng/L (ref <18)

## 2022-01-01 LAB — URINALYSIS, ROUTINE W REFLEX MICROSCOPIC
Bilirubin Urine: NEGATIVE
Glucose, UA: NEGATIVE mg/dL
Nitrite: NEGATIVE
Specific Gravity, Urine: 1.025 (ref 1.005–1.030)
pH: 5.5 (ref 5.0–8.0)

## 2022-01-01 LAB — LACTIC ACID, PLASMA: Lactic Acid, Venous: 1.2 mmol/L (ref 0.5–1.9)

## 2022-01-01 LAB — BRAIN NATRIURETIC PEPTIDE: B Natriuretic Peptide: 40.6 pg/mL (ref 0.0–100.0)

## 2022-01-01 MED ORDER — CEPHALEXIN 500 MG PO CAPS: 500.0000 mg | ORAL_CAPSULE | Freq: Three times a day (TID) | ORAL | 0 refills | Status: AC

## 2022-01-01 NOTE — Telephone Encounter (Signed)
Patient is being seen by [facility] nurse practitioner. She is saying patients cough is worse, she can hear "gunk in chest"- She cannot catch a full breath. Is not fully alert, patient will fall asleep during conversations. Oxygen levels are perfect, 96%.  ? ?Patient being Triaged as CMA.  ?

## 2022-01-01 NOTE — ED Notes (Signed)
Patient back from CT.

## 2022-01-01 NOTE — ED Provider Notes (Signed)
?[facility] ?Provider Note ? ? ?CSN: [id number] ?Arrival date & time: 01/01/22  1706 ? ?  ? ?History ? ?Chief Complaint  ?Patient presents with  ? Weakness  ? ? ?Nancy Mccormick is a 88 y.o. female. ? ?HPI ? ?  ? ?88 year old female with a history of hypertension, hyperlipidemia, paroxysmal atrial fibrillation on Eliquis, previous CVA, newly diagnosed aortic insufficiency, 2 months of dyspnea on exertion and fatigue for which she is undergoing outpatient evaluation, who presents with concern for continued dyspnea on exertion, cough, and fatigue. ? ?She reports that she had had a GI illness in February with diarrhea and stomach cramping, and since then she has had severe fatigue.  Reports she feels short of breath with very minimal activity.  Reports that she feels exhausted and short of breath with walking even very short distances.  She just wants to feel like herself again, and has not felt well felt well for the past 2 months. ? ?She had a recent stress test which showed no inducible ischemia, and a recent CTA PE study which was negative for acute PE, and additionally did not show any sign of significant lung disease.  She had 1 mildly enlarged lymph node.  She has no history of smoking, but did live with a smoker for several years. ? ?Cardiology put her on Lasix on Friday thinking that some of her symptoms may be due to diastolic heart failure.  She reports since that time she has had some worsening cough.  The swelling in her legs has decreased since she was on the Lasix. ? ?She has not had chest pain, nausea, vomiting, dysuria, fevers. No other changes in medication ? ?She has had headaches which are new ? ?Past Medical History:  ?Diagnosis Date  ? Arthritis   ? back, left shoulder, and fingers  ? Atrial fibrillation ([place])   ? History of chicken pox   ? Hyperlipidemia   ? Hypertension   ? Osteoporosis   ? Stroke [facility])   ?  ? ?Home Medications ?Prior to Admission medications    ?Medication Sig Start Date End Date Taking? Authorizing Provider  ?cephALEXin (KEFLEX) 500 MG capsule Take 1 capsule (500 mg total) by mouth 3 (three) times daily for 7 days. 01/01/22 01/08/22 Yes Gareth Morgan, MD  ?acetaminophen (TYLENOL) 500 MG tablet Take 1,000 mg by mouth every 6 (six) hours as needed for mild pain or moderate pain.    [provider]  ?Calcium Carbonate-Vitamin D (CALCIUM + D PO) Take 1 tablet by mouth at bedtime.    [provider]  ?diltiazem (CARDIZEM CD) 240 MG 24 hr capsule TAKE 1 CAPSULE(240 MG) BY MOUTH DAILY 07/08/21   Vivi Barrack, MD  ?ELIQUIS 5 MG TABS tablet TAKE 1 TABLET(5 MG) BY MOUTH TWICE DAILY 10/08/21   Vivi Barrack, MD  ?furosemide (LASIX) 20 MG tablet TAKE 40 MG (2 TABLETS) FOR 3 DAYS, THEN DECREASE TO 20 MG DAILY 12/26/21   Eileen Stanford, PA-C  ?Multiple Vitamin (MULTIVITAMIN WITH MINERALS) TABS tablet Take 1 tablet by mouth at bedtime.    [provider]  ?nitroGLYCERIN (NITROSTAT) 0.4 MG SL tablet Place 1 tablet (0.4 mg total) under the tongue as needed. 06/20/14   Chipper Herb, MD  ?polyethylene glycol (MIRALAX / GLYCOLAX) 17 g packet Take 17 g by mouth daily.    [provider]  ?Psyllium 500 MG CAPS Take 2 capsules by mouth daily.    [provider]  ?   ? ?  Allergies    ?Shellfish allergy   ? ?Review of Systems   ?Review of Systems ? ?Physical Exam ?Updated Vital Signs ?BP (!) 188/68   Pulse (!) 55   Temp 98.2 ?F (36.8 ?C)   Resp 18   SpO2 93%  ?Physical Exam ?Vitals and nursing note reviewed.  ?Constitutional:   ?   General: She is not in acute distress. ?   Appearance: She is well-developed. She is not diaphoretic.  ?HENT:  ?   Head: Normocephalic and atraumatic.  ?Eyes:  ?   Conjunctiva/sclera: Conjunctivae normal.  ?Cardiovascular:  ?   Rate and Rhythm: Normal rate and regular rhythm.  ?   Heart sounds: Normal heart sounds. No murmur heard. ?  No friction rub. No gallop.  ?Pulmonary:  ?   Effort:  Pulmonary effort is normal. No respiratory distress.  ?   Breath sounds: Normal breath sounds. No wheezing or rales.  ?Abdominal:  ?   General: There is no distension.  ?   Palpations: Abdomen is soft.  ?   Tenderness: There is no abdominal tenderness. There is no guarding.  ?Musculoskeletal:     ?   General: No tenderness.  ?   Cervical back: Normal range of motion.  ?Skin: ?   General: Skin is warm and dry.  ?   Findings: No erythema or rash.  ?Neurological:  ?   Mental Status: She is alert and oriented to person, place, and time.  ? ? ?ED Results / Procedures / Treatments   ?Labs ?(all labs ordered are listed, but only abnormal results are displayed) ?Labs Reviewed  ?COMPREHENSIVE METABOLIC PANEL - Abnormal; Notable for the following components:  ?    Result Value  ? Glucose, Bld 103 (*)   ? All other components within normal limits  ?URINALYSIS, ROUTINE W REFLEX MICROSCOPIC - Abnormal; Notable for the following components:  ? Hgb urine dipstick TRACE (*)   ? Ketones, ur TRACE (*)   ? Protein, ur TRACE (*)   ? Leukocytes,Ua LARGE (*)   ? Bacteria, UA RARE (*)   ? All other components within normal limits  ?RESP PANEL BY RT-PCR (FLU A&B, COVID) ARPGX2  ?CBC WITH DIFFERENTIAL/PLATELET  ?BRAIN NATRIURETIC PEPTIDE  ?LACTIC ACID, PLASMA  ?TROPONIN I (HIGH SENSITIVITY)  ? ? ?EKG ?None ? ?Radiology ?DG Chest 2 View ? ?Result Date: 01/01/2022 ?CLINICAL DATA:  shortness of breath/fatigue EXAM: CHEST - 2 VIEW COMPARISON:  Chest x-ray 06/05/2018 FINDINGS: The heart and mediastinal contours are unchanged. Aortic calcification. Linear scarring versus atelectasis of the lingula. No focal consolidation. No pulmonary edema. No pleural effusion. No pneumothorax. No acute osseous abnormality. IMPRESSION: No active cardiopulmonary disease. Electronically Signed   By: Iven Finn M.D.   On: 01/01/2022 18:10  ? ?CT Head Wo Contrast ? ?Result Date: 01/01/2022 ?CLINICAL DATA:  Increasing headaches EXAM: CT HEAD WITHOUT CONTRAST  TECHNIQUE: Contiguous axial images were obtained from the base of the skull through the vertex without intravenous contrast. RADIATION DOSE REDUCTION: This exam was performed according to the departmental dose-optimization program which includes automated exposure control, adjustment of the mA and/or kV according to patient size and/or use of iterative reconstruction technique. COMPARISON:  05/11/2021 FINDINGS: Brain: No evidence of acute infarction, hemorrhage, hydrocephalus, extra-axial collection or mass lesion/mass effect. Vascular: No hyperdense vessel or unexpected calcification. Skull: Normal. Negative for fracture or focal lesion. Sinuses/Orbits: No acute finding. Other: None. IMPRESSION: No acute intracranial abnormality noted. Electronically Signed   By: Inez Catalina  M.D.   On: 01/01/2022 20:10   ? ?Procedures ?Procedures  ? ? ?Medications Ordered in ED ?Medications - No data to display ? ?ED Course/ Medical Decision Making/ A&P ?  ?                        ?Medical Decision Making ?Amount and/or Complexity of Data Reviewed ?Labs: ordered. ?Radiology: ordered. ? ?Risk ?Prescription drug management. ? ? ? ?88 year old female with a history of hypertension, hyperlipidemia, paroxysmal atrial fibrillation on Eliquis, previous CVA, newly diagnosed aortic insufficiency, 2 months of dyspnea on exertion and fatigue for which she is undergoing outpatient evaluation, who presents with concern for continued dyspnea on exertion, cough, and fatigue. ?  ? ?Differential diagnosis for dyspnea includes ACS, PE, COPD exacerbation, CHF exacerbation, anemia, pneumonia, viral etiology such as COVID 19 infection, metabolic abnormality.   ? ?Chest x-ray was done and evaluated by me which showedno pneumonia, pulmonary edema, pneumothorax. EKG was evaluated by me which showed a sinus arrhythmia.   ? ?Labs completed and evaluated by me show no anemia, no leukocytosis nor left shift, normal electrolytes. BNP was 40.6. Denies CP,  troponin 5, doubt ACS.   COVID/flu testing negative. Lactic acid negative.  NO signs of sepsis. ? ?Also describes headaches which are new and in setting of being on eliquis, obtained head CT to evaluate for bleed which showed no

## 2022-01-01 NOTE — Telephone Encounter (Signed)
Please see note.

## 2022-01-01 NOTE — Telephone Encounter (Signed)
Has she discussed with cardiology? Looks like they want her to get in to see Dr Percival Spanish. ? ?Algis Greenhouse. Jerline Pain, MD ?01/01/2022 11:38 AM  ? ?

## 2022-01-01 NOTE — Telephone Encounter (Signed)
Patient stated her nuclear and ct have come back negative. Patient stated she was put on lasix and has developed a severe cough, spitting up mucus and is fatigued. Wants to know what she should do and what are the next steps. Would like a call back .  ?

## 2022-01-01 NOTE — Telephone Encounter (Signed)
Please advise 

## 2022-01-01 NOTE — ED Notes (Signed)
ED Provider at bedside. 

## 2022-01-01 NOTE — ED Notes (Signed)
Patient transported to CT 

## 2022-01-02 NOTE — Telephone Encounter (Signed)
Can we check on patient? She needs to go to the ED if she is somnolent. ? ?Nancy Mccormick. Jerline Pain, MD ?01/02/2022 10:30 AM  ? ?

## 2022-01-02 NOTE — Telephone Encounter (Signed)
Spoke with patient feeling better today, was at ED yesterday  ?Schedule ED F/U appointment  ?

## 2022-01-08 ENCOUNTER — Ambulatory Visit (INDEPENDENT_AMBULATORY_CARE_PROVIDER_SITE_OTHER): Payer: Medicare HMO | Admitting: Family Medicine

## 2022-01-08 ENCOUNTER — Encounter: Payer: Self-pay | Admitting: Family Medicine

## 2022-01-08 VITALS — BP 178/82 | HR 59 | Temp 98.4°F | Ht 64.0 in | Wt 187.4 lb

## 2022-01-08 DIAGNOSIS — K5900 Constipation, unspecified: Secondary | ICD-10-CM | POA: Diagnosis not present

## 2022-01-08 DIAGNOSIS — R0600 Dyspnea, unspecified: Secondary | ICD-10-CM

## 2022-01-08 DIAGNOSIS — I351 Nonrheumatic aortic (valve) insufficiency: Secondary | ICD-10-CM

## 2022-01-08 DIAGNOSIS — I10 Essential (primary) hypertension: Secondary | ICD-10-CM

## 2022-01-08 LAB — CBC
HCT: 40.2 % (ref 36.0–46.0)
Hemoglobin: 13.1 g/dL (ref 12.0–15.0)
MCHC: 32.6 g/dL (ref 30.0–36.0)
MCV: 89.8 fl (ref 78.0–100.0)
Platelets: 313 10*3/uL (ref 150.0–400.0)
RBC: 4.48 Mil/uL (ref 3.87–5.11)
RDW: 15.1 % (ref 11.5–15.5)
WBC: 9.3 10*3/uL (ref 4.0–10.5)

## 2022-01-08 LAB — COMPREHENSIVE METABOLIC PANEL
ALT: 22 U/L (ref 0–35)
AST: 19 U/L (ref 0–37)
Albumin: 4 g/dL (ref 3.5–5.2)
Alkaline Phosphatase: 93 U/L (ref 39–117)
BUN: 13 mg/dL (ref 6–23)
CO2: 27 mEq/L (ref 19–32)
Calcium: 9.2 mg/dL (ref 8.4–10.5)
Chloride: 107 mEq/L (ref 96–112)
Creatinine, Ser: 0.66 mg/dL (ref 0.40–1.20)
GFR: 79.95 mL/min (ref >60.00)
Glucose, Bld: 96 mg/dL (ref 70–99)
Potassium: 3.7 mEq/L (ref 3.5–5.1)
Sodium: 142 mEq/L (ref 135–145)
Total Bilirubin: 0.6 mg/dL (ref 0.2–1.2)
Total Protein: 6.9 g/dL (ref 6.0–8.3)

## 2022-01-08 MED ORDER — FLUTICASONE PROPIONATE 50 MCG/ACT NA SUSP
2.0000 | Freq: Every day | NASAL | 6 refills | Status: DC
Start: 2022-01-08 — End: 2023-05-11

## 2022-01-08 NOTE — Assessment & Plan Note (Addendum)
Elevated today though typically very well controlled on diltiazem 240 mg daily.  She has been well controlled at her last several office visits.  We will continue home monitoring and she will let me know if persistently elevated. ?

## 2022-01-08 NOTE — Patient Instructions (Signed)
It was very nice to see you today! ? ?We will check blood work today. ? ?Please continue medications. ? ?Please keep an eye on your blood pressure and let me know if it is persistently elevated. ? ?I will refer you to see a pulmonologist and also gastroenterologist.  Please let me know how you are doing over the next few weeks. ? ?Take care, ?Dr Jerline Pain ? ?PLEASE NOTE: ? ?If you had any lab tests please let us know if you have not heard back within a few days. You may see your results on mychart before we have a chance to review them but we will give you a call once they are reviewed by [place]. If we ordered any referrals today, please let us know if you have not heard from their office within the next week.  ? ?Please try these tips to maintain a healthy lifestyle: ? ?Eat at least 3 REAL meals and 1-2 snacks per day.  Aim for no more than 5 hours between eating.  If you eat breakfast, please do so within one hour of getting up.  ? ?Each meal should contain half fruits/vegetables, one quarter protein, and one quarter carbs (no bigger than a computer mouse) ? ?Cut down on sweet beverages. This includes juice, soda, and sweet tea.  ? ?Drink at least 1 glass of water with each meal and aim for at least 8 glasses per day ? ?Exercise at least 150 minutes every week.   ?

## 2022-01-08 NOTE — Assessment & Plan Note (Signed)
Continue management per cardiology. 

## 2022-01-08 NOTE — Assessment & Plan Note (Signed)
Still not controlled.  She is having to use suppository multiple times per week.  We discussed fiber supplementation and probiotic supplementation.  We will place referral to GI for further management given persistence of symptoms. ?

## 2022-01-08 NOTE — Assessment & Plan Note (Signed)
Symptoms are still persistent though more manageable.  She has had pretty extensive work-up at this point including echocardiogram, CTA, and nuclear stress test.  CTA nuclear stress test was negative.  Her echocardiogram showed mild to moderate aortic regurgitation and grade 1 diastolic dysfunction.  She was started on Lasix by cardiology for her grade 1 diastolic dysfunction though patient is not sure if this is helped with her dyspnea on exertion at all.  She will be following up with them again in a few weeks. ? ?It is possible she could have some underlying COPD given her history of smoking exposure.  She also had COVID about 4 months ago which could be contributing as well.  We will place referral to pulmonology for further evaluation including PFTs. ?

## 2022-01-08 NOTE — Progress Notes (Signed)
? ?Charlena Haub is a 88 y.o. female who presents today for an office visit. ? ?Assessment/Plan:  ?New/Acute Problems: ?Cough ?No red flags.  She has had extensive work-up recently including chest x-ray and CTA which did not show any pneumonia or bronchitis.  She does have some evidence of allergic nidus which is possibly contributing.  Recommended trial of Claritin or Zyrtec.  We will place referral to pulmonology as below for PFTs. ? ?Chronic Problems Addressed Today: ?Dyspnea ?Symptoms are still persistent though more manageable.  She has had pretty extensive work-up at this point including echocardiogram, CTA, and nuclear stress test.  CTA nuclear stress test was negative.  Her echocardiogram showed mild to moderate aortic regurgitation and grade 1 diastolic dysfunction.  She was started on Lasix by cardiology for her grade 1 diastolic dysfunction though patient is not sure if this is helped with her dyspnea on exertion at all.  She will be following up with them again in a few weeks. ? ?It is possible she could have some underlying COPD given her history of smoking exposure.  She also had COVID about 4 months ago which could be contributing as well.  We will place referral to pulmonology for further evaluation including PFTs. ? ?Aortic valve regurgitation ?Continue management per cardiology. ? ?Constipation ?Still not controlled.  She is having to use suppository multiple times per week.  We discussed fiber supplementation and probiotic supplementation.  We will place referral to GI for further management given persistence of symptoms. ? ?Essential hypertension ?Elevated today though typically very well controlled on diltiazem 240 mg daily.  She has been well controlled at her last several office visits.  We will continue home monitoring and she will let me know if persistently elevated. ? ? ?  ?Subjective:  ?HPI: ? ?Patient here for ED follow up.  She went to the ED on 01/01/2022 with worsening dyspnea on  exertion, cough and fatigue.  We had just seen her about a week for this for the same issue.  She has had a recent echocardiogram that did show aortic valve insufficiency.  She saw her cardiologist after we saw her before her ED visit and had further work-up including CTA which was negative for PE and a stress test which was low risk.  Cardiology also started on Lasix. ? ?In the ED she had further work-up including EKG, BNP, and troponin.  All these were negative.  COVID and flu test were negative as well.  She had a head CT scan which was negative as well.  She was found to have leukocytes on her UA in the ED and was started on Keflex.  Urine culture was not sent.  She was discharged home. ? ?Over the last few days her symptoms have been about the same.  She still has significant amount of dyspnea on exertion though is learned to pace herself.  Does not have any symptoms at rest.  She has had more of a cough the last few days.  No sputum production.  No fevers or chills.  No specific treatments tried.  She does have a history of allergic rhinitis. ? ?   ?  ?Objective:  ?Physical Exam: ?BP (!) 178/82   Pulse (!) 59   Temp 98.4 ?F (36.9 ?C) (Temporal)   Ht '5\' 4"'$  (1.626 m)   Wt 187 lb 6.4 oz (85 kg)   SpO2 98%   BMI 32.17 kg/m?   ?Gen: No acute distress, resting comfortably  ?HEENT: TMs clear.  Nasal mucosa with erythema and clear discharge.  OP clear. ?CV: Regular rate and rhythm with no murmurs appreciated ?Pulm: Normal work of breathing, clear to auscultation bilaterally with no crackles, wheezes, or rhonchi ?Neuro: Grossly normal, moves all extremities ?Psych: Normal affect and thought content ? ?Time Spent: ?45 minutes of total time was spent on the date of the encounter performing the following actions: chart review prior to seeing the patient including recent ED visit, obtaining history, performing a medically necessary exam, counseling on the treatment plan, placing orders, and documenting in our EHR.   ? ? ?   ? ?Algis Greenhouse. Jerline Pain, MD ?01/08/2022 10:55 AM  ?

## 2022-01-09 ENCOUNTER — Other Ambulatory Visit: Payer: Medicare HMO

## 2022-01-09 NOTE — Progress Notes (Signed)
Please inform patient of the following: ? ?Labs are normal. ? ?Nancy Mccormick. Jerline Pain, MD ?01/09/2022 2:13 PM  ?

## 2022-01-20 ENCOUNTER — Encounter: Payer: Self-pay | Admitting: Physician Assistant

## 2022-01-20 ENCOUNTER — Telehealth: Payer: Self-pay | Admitting: Cardiology

## 2022-01-20 NOTE — Telephone Encounter (Signed)
Pt states that she has been experiencing weakness, and fatigue since last month. Pt has appt scheduled for 02/05/22. Please advice. ?

## 2022-01-20 NOTE — Telephone Encounter (Signed)
Patient reports that since December, she has felt fatigued and weak to the point of cutting her activities in half. In February, she had stomach pain and diarrhea. The diarrhea stopped and now she has constipation. She feels bloated and does not want to strain, which she says doesn't help anyway. She takes miralax every day, but can only have bowel movements if she uses a suppository. Her PCP has referred her to a pulmonologist and to a GI. Her concerns are fatigue and constipation. ?

## 2022-01-27 ENCOUNTER — Encounter: Payer: Self-pay | Admitting: Cardiology

## 2022-01-27 ENCOUNTER — Ambulatory Visit: Payer: Medicare HMO | Admitting: Cardiology

## 2022-01-27 ENCOUNTER — Ambulatory Visit (INDEPENDENT_AMBULATORY_CARE_PROVIDER_SITE_OTHER): Payer: Medicare HMO

## 2022-01-27 VITALS — BP 142/68 | HR 76 | Ht 64.0 in | Wt 185.6 lb

## 2022-01-27 DIAGNOSIS — R002 Palpitations: Secondary | ICD-10-CM | POA: Diagnosis not present

## 2022-01-27 MED ORDER — DILTIAZEM HCL ER COATED BEADS 180 MG PO CP24: 180.0000 mg | ORAL_CAPSULE | Freq: Every day | ORAL | 3 refills | Status: DC

## 2022-01-27 NOTE — Patient Instructions (Signed)
Medication Instructions:  ?DECREASE: Cardizem 180 mg daily ? ?*If you need a refill on your cardiac medications before your next appointment, please call your pharmacy* ? ? ?Lab Work: ?None ordered today ? ? ?Testing/Procedures: ?Your physician has recommended that you wear a 14 day Zio monitor.  ? ?This monitor is a medical device that records the heart?s electrical activity. Doctors most often use these monitors to diagnose arrhythmias. Arrhythmias are problems with the speed or rhythm of the heartbeat. The monitor is a small device applied to your chest. You can wear one while you do your normal daily activities. While wearing this monitor if you have any symptoms to push the button and record what you felt. Once you have worn this monitor for the period of time provider prescribed (Usually 14 days), you will return the monitor device in the postage paid box. Once it is returned they will download the data collected and provide [place] with a report which the provider will then review and we will call you with those results. Important tips: ? ?Avoid showering during the first 24 hours of wearing the monitor. ?Avoid excessive sweating to help maximize wear time. ?Do not submerge the device, no hot tubs, and no swimming pools. ?Keep any lotions or oils away from the patch. ?After 24 hours you may shower with the patch on. Take brief showers with your back facing the shower head.  ?Do not remove patch once it has been placed because that will interrupt data and decrease adhesive wear time. ?Push the button when you have any symptoms and write down what you were feeling. ?Once you have completed wearing your monitor, remove and place into box which has postage paid and place in your outgoing mailbox.  ?If for some reason you have misplaced your box then call our office and we can provide another box and/or mail it off for you. ? ?  ? ? ?Follow-Up: ?At [facility], you and your health needs are our priority.  As part of  our continuing mission to provide you with exceptional heart care, we have created designated Provider Care Teams.  These Care Teams include your primary Cardiologist (physician) and Advanced Practice Providers (APPs -  Physician Assistants and Nurse Practitioners) who all work together to provide you with the care you need, when you need it. ? ?We recommend signing up for the patient portal called "MyChart".  Sign up information is provided on this After Visit Summary.  MyChart is used to connect with patients for Virtual Visits (Telemedicine).  Patients are able to view lab/test results, encounter notes, upcoming appointments, etc.  Non-urgent messages can be sent to your provider as well.   ?To learn more about what you can do with MyChart, go to [email or web address].   ? ?Your next appointment:   ?4 month(s) ? ?The format for your next appointment:   ?In Person ? ?Provider:   ?Minus Breeding, MD { ? ?Important Information About Sugar ? ? ? ? ? ? ?

## 2022-01-27 NOTE — Progress Notes (Signed)
?  ?Cardiology Office Note ? ? ?Date:  01/27/2022  ? ?ID:  Nancy Mccormick, DOB 25-Apr-1934, MRN [id number] ? ?PCP:  Vivi Barrack, MD  ?Cardiologist:   Minus Breeding, MD ? ? ?Chief Complaint  ?Patient presents with  ? Fatigue  ? ? ?  ?History of Present Illness: ?Nancy Mccormick is a 88 y.o. female who presents for followup of atrial fibrillation she was admitted in Nov 2018 with a small MCA infarct and was in atrial fib.  She was in the hospital in August of 2019.  She had atrial fib with RVR which converted spontaneously to NSR.   ? ?Since I last saw her she was in the emergency room in March with shortness of breath.   .I reviewed these records for this visit.     There was no clear cardiac etiology.  He did have a urinary infection I see.  She presents with continued SOB and profound fatigue.  She has had an extensive work up to include echo with only mild AI.  There was a normal BNP.  She had a negative perfusion study and has had lab work with no anemia and a normal TSH.  In the ED CXR was unremarkable.  There was no PE on CT.  She had a head CT and had no acute findings.  ? ?She says that she is SOB doing a mild level of activity in her house.  The patient denies any new symptoms such as chest discomfort, neck or arm discomfort. There has been no new PND or orthopnea. There have been no reported palpitations, presyncope or syncope.   However, she does have acute waves of fatigue that last for many minutes and she is not sure what her HR or BP is at that time.  She does not report any symptomatic atrial fib but she might not recognize this.   ? ?Of note she thinks that many of her symptoms started with the flu.   ? ?Past Medical History:  ?Diagnosis Date  ? Arthritis   ? back, left shoulder, and fingers  ? Atrial fibrillation ([place])   ? History of chicken pox   ? Hyperlipidemia   ? Hypertension   ? Osteoporosis   ? Stroke [facility])   ? ? ?Past Surgical History:  ?Procedure Laterality Date  ? CATARACT EXTRACTION     ? KNEE ARTHROSCOPY Right 07/04/2015  ? Procedure: RIGHT KNEE ARTHROSCOPY WITH MEDIAL MENISCAL DEBRIDEMENT AND CHONDROPLASTY;  Surgeon: Gaynelle Arabian, MD;  Location: [facility];  Service: Orthopedics;  Laterality: Right;  ? OPEN REDUCTION INTERNAL FIXATION (ORIF) DISTAL RADIAL FRACTURE Right 12/28/2012  ? Procedure: OPEN REDUCTION INTERNAL FIXATION (ORIF) DISTAL RADIAL FRACTURE;  Surgeon: Hessie Dibble, MD;  Location: [facility];  Service: Orthopedics;  Laterality: Right;  ? VAGINAL HYSTERECTOMY    ? ? ? ?Current Outpatient Medications  ?Medication Sig Dispense Refill  ? acetaminophen (TYLENOL) 500 MG tablet Take 1,000 mg by mouth every 6 (six) hours as needed for mild pain or moderate pain.    ? Calcium Carbonate-Vitamin D (CALCIUM + D PO) Take 1 tablet by mouth at bedtime.    ? Cholecalciferol 50 MCG (2000 UT) TABS Take by mouth.    ? ELIQUIS 5 MG TABS tablet TAKE 1 TABLET(5 MG) BY MOUTH TWICE DAILY 180 tablet 1  ? fluticasone (FLONASE) 50 MCG/ACT nasal spray Place 2 sprays into both nostrils daily. 16 g 6  ? furosemide (LASIX) 20 MG tablet TAKE 40 MG (2  TABLETS) FOR 3 DAYS, THEN DECREASE TO 20 MG DAILY 90 tablet 3  ? Multiple Vitamin (MULTIVITAMIN WITH MINERALS) TABS tablet Take 1 tablet by mouth at bedtime.    ? nitroGLYCERIN (NITROSTAT) 0.4 MG SL tablet Place 1 tablet (0.4 mg total) under the tongue as needed. 25 tablet 11  ? polyethylene glycol (MIRALAX / GLYCOLAX) 17 g packet Take 17 g by mouth daily.    ? Psyllium 500 MG CAPS Take 2 capsules by mouth daily.    ? diltiazem (CARDIZEM CD) 180 MG 24 hr capsule Take 1 capsule (180 mg total) by mouth daily. 90 capsule 3  ? ?No current facility-administered medications for this visit.  ? ? ?Allergies:   Shellfish allergy  ? ? ?ROS:  Please see the history of present illness.   Otherwise, review of systems are positive for none.   All other systems are reviewed and negative.  ? ? ?PHYSICAL EXAM: ?VS:  BP (!) 142/68   Pulse 76   Ht '5\' 4"'$  (1.626 m)    Wt 185 lb 9.6 oz (84.2 kg)   SpO2 98%   BMI 31.86 kg/m?  , BMI Body mass index is 31.86 kg/m?. ?GENERAL:  Well appearing ?NECK:  No jugular venous distention, waveform within normal limits, carotid upstroke brisk and symmetric, no bruits, no thyromegaly ?LUNGS:  Clear to auscultation bilaterally ?CHEST:  Unremarkable ?HEART:  PMI not displaced or sustained,S1 and S2 within normal limits, no S3, no S4, no clicks, no rubs, no murmurs ?ABD:  Flat, positive bowel sounds normal in frequency in pitch, no bruits, no rebound, no guarding, no midline pulsatile mass, no hepatomegaly, no splenomegaly ?EXT:  2 plus pulses throughout, no edema, no cyanosis no clubbing ? ? ?EKG:  EKG is  ordered today. ?The ekg ordered 01/27/2022 demonstrates sinus rhythm, rate 76, premature atrial contractions, no acute ST-T wave changes.  PACs ? ? ?Recent Labs: ?12/02/2021: TSH 3.23 ?12/26/2021: NT-Pro BNP 184 ?01/01/2022: B Natriuretic Peptide 40.6 ?01/08/2022: ALT 22; BUN 13; Creatinine, Ser 0.66; Hemoglobin 13.1; Platelets 313.0; Potassium 3.7; Sodium 142  ? ? ?Lipid Panel ?   ?Component Value Date/Time  ? CHOL 184 10/31/2020 0752  ? CHOL 203 (H) 03/28/2019 0936  ? CHOL 129 03/31/2013 0826  ? TRIG 181.0 (H) 10/31/2020 0752  ? TRIG 147 04/22/2016 1232  ? TRIG 87 03/31/2013 0826  ? HDL 69.20 10/31/2020 0752  ? HDL 78 03/28/2019 0936  ? HDL 69 04/22/2016 1232  ? HDL 56 03/31/2013 0826  ? CHOLHDL 3 10/31/2020 0752  ? VLDL 36.2 10/31/2020 0752  ? [place] 78 10/31/2020 0752  ? [place] 93 03/28/2019 0936  ? LDLCALC 135 (H) 06/15/2014 1247  ? [place] 56 03/31/2013 0826  ? ?  ? ?Wt Readings from Last 3 Encounters:  ?01/27/22 185 lb 9.6 oz (84.2 kg)  ?01/08/22 187 lb 6.4 oz (85 kg)  ?12/27/21 189 lb (85.7 kg)  ?  ? ? ?Other studies Reviewed: ?Additional studies/ records that were reviewed today include: ED records ?Review of the above records demonstrates:  Please see elsewhere in the note.   ? ? ?ASSESSMENT AND PLAN: ? ? ?ATRIAL FIBRILLATION:      She  tolerates anticoagulation.  I do not see evidence that her acute fatigue is related to atrial fib but I will have her wear a two week monitor to try to exclude that as a possibility.  Ms. Khadija Thier has a CHA2DS2 - VASc score of 6.    ? ?HTN:  The blood pressure is at borderline.  No change in therapy.    She will keep an eye on her BP as I reduce the meds as below.  ?  ?DYSLIPIDEMIA:    LDL is 78 in 2022.  No change in therapy ? ?FATIGUE:  This is the most significant complaint.  I will reduce her Cardizem to see if this helps at all.   ? ? ?Current medicines are reviewed at length with the patient today.  The patient does not have concerns regarding medicines. ? ?The following changes have been made: See elsewhere ? ?Labs/ tests ordered today include:    ? ?Orders Placed This Encounter  ?Procedures  ? LONG TERM MONITOR (3-14 DAYS)  ? EKG 12-Lead  ? ? ? ?Disposition:   FU with APP in six months.   ? ? ?Signed, ?Minus Breeding, MD  ?01/27/2022 4:57 PM    ?[place] ? ? ? ?

## 2022-01-27 NOTE — Progress Notes (Unsigned)
Enrolled for [employer] to mail a ZIO XT long term holter monitor to the patients address on file.  

## 2022-01-30 NOTE — Telephone Encounter (Signed)
Patient has been seen.

## 2022-01-31 ENCOUNTER — Ambulatory Visit: Payer: Medicare HMO | Admitting: Physician Assistant

## 2022-02-04 ENCOUNTER — Institutional Professional Consult (permissible substitution): Payer: Medicare HMO | Admitting: Pulmonary Disease

## 2022-02-05 ENCOUNTER — Encounter: Payer: Self-pay | Admitting: Physician Assistant

## 2022-02-05 ENCOUNTER — Ambulatory Visit: Payer: Medicare HMO | Admitting: Physician Assistant

## 2022-02-05 ENCOUNTER — Other Ambulatory Visit (INDEPENDENT_AMBULATORY_CARE_PROVIDER_SITE_OTHER): Payer: Medicare HMO

## 2022-02-05 ENCOUNTER — Ambulatory Visit: Payer: Medicare HMO | Admitting: Cardiology

## 2022-02-05 VITALS — BP 148/60 | HR 80 | Ht 64.0 in | Wt 184.4 lb

## 2022-02-05 DIAGNOSIS — R0602 Shortness of breath: Secondary | ICD-10-CM

## 2022-02-05 DIAGNOSIS — R194 Change in bowel habit: Secondary | ICD-10-CM

## 2022-02-05 DIAGNOSIS — K5900 Constipation, unspecified: Secondary | ICD-10-CM

## 2022-02-05 DIAGNOSIS — R5383 Other fatigue: Secondary | ICD-10-CM

## 2022-02-05 LAB — BASIC METABOLIC PANEL
BUN: 15 mg/dL (ref 6–23)
CO2: 27 mEq/L (ref 19–32)
Calcium: 9.2 mg/dL (ref 8.4–10.5)
Chloride: 109 mEq/L (ref 96–112)
Creatinine, Ser: 0.7 mg/dL (ref 0.40–1.20)
GFR: 78.78 mL/min (ref >60.00)
Glucose, Bld: 100 mg/dL — ABNORMAL HIGH (ref 70–99)
Potassium: 3.8 mEq/L (ref 3.5–5.1)
Sodium: 143 mEq/L (ref 135–145)

## 2022-02-05 LAB — C-REACTIVE PROTEIN: CRP: <1 mg/dL (ref 0.5–20.0)

## 2022-02-05 LAB — SEDIMENTATION RATE: Sed Rate: 31 mm/hr — ABNORMAL HIGH (ref 0–30)

## 2022-02-05 NOTE — Patient Instructions (Signed)
If you are age 86 or older, your body mass index should be between 23-30. Your Body mass index is 31.65 kg/m?Marland Kitchen If this is out of the aforementioned range listed, please consider follow up with your Primary Care Provider. ?________________________________________________________ ? ?The [place] [facility] providers would like to encourage you to use [facility] to communicate with providers for non-urgent requests or questions.  Due to long hold times on the telephone, sending your provider a message by [facility] may be a faster and more efficient way to get a response.  Please allow 48 business hours for a response.  Please remember that this is for non-urgent requests.  ?_______________________________________________________ ? ?Your provider has requested that you go to the basement level for lab work before leaving today. Press "B" on the elevator. The lab is located at the first door on the left as you exit the elevator. ? ?You have been scheduled for a CT scan of the abdomen and pelvis at [facility], 1st floor Radiology. You are scheduled on 02/13/2022  at 8:30 am. You should arrive 15 minutes prior to your appointment time for registration.  Please pick up 2 bottles of contrast from [place] at least 3 days prior to your scan. The solution may taste better if refrigerated, but do NOT add ice or any other liquid to this solution. Shake well before drinking.  ? ?Please follow the written instructions below on the day of your exam:  ? ?1) Do not eat anything after 4:30 am (4 hours prior to your test)  ? ?2) Drink 1 bottle of contrast @ 6:30 am (2 hours prior to your exam)  Remember to shake well before drinking and do NOT pour over ice. ?    Drink 1 bottle of contrast @ 7:30 am (1 hour prior to your exam)  ? ?You may take any medications as prescribed with a small amount of water, if necessary. If you take any of the following medications: METFORMIN, GLUCOPHAGE, GLUCOVANCE, AVANDAMET, RIOMET, FORTAMET, ACTOPLUS MET,  JANUMET, [place] or METAGLIP, you MAY be asked to HOLD this medication 48 hours AFTER the exam.  ? ?The purpose of you drinking the oral contrast is to aid in the visualization of your intestinal tract. The contrast solution may cause some diarrhea. Depending on your individual set of symptoms, you may also receive an intravenous injection of x-ray contrast/dye. Plan on being at [facility] for 45 minutes or longer, depending on the type of exam you are having performed.  ? ?If you have any questions regarding your exam or if you need to reschedule, you may call [facility] at [phone] between the hours of 8:00 am and 5:00 pm, Monday-Friday.  ? ? ?Amy Esterwood, PA-C recommends that you complete a bowel purge (to clean out your bowels). Please do the following: ?Purchase a bottle of Miralax over the counter. ?You will then drink 5-6 capfuls of Miralax mixed in an adequate amount of water/juice/gatorade (you may choose which of these liquids to drink) over the next 2-3 hours. ?You should expect results within 1 to 6 hours after completing the bowel purge. ? ?Continue Miralax 1 capful daily in 8 ounces of water or juice daily, ? ?Follow up pending the results of your CT. ? ?Thank you for entrusting me with your care and choosing [facility]. ? ?Nicoletta Ba, PA-C ?

## 2022-02-05 NOTE — Progress Notes (Signed)
? ?Subjective:  ? ? Patient ID: Nancy Mccormick, female    DOB: 02-19-1934, 88 y.o.   MRN: [id number] ? ?HPI ?Nancy Mccormick is a pleasant 88 year old white female, new to GI today referred by Dr. Dimas Chyle after change in bowel habits over the past 2 months.  Patient says she had very remotely had a colonoscopy probably 30 years ago for screening, and did not have any polyps.  She has not had any other GI evaluation ?She does have history of hypertension, atrial fibrillation for which she is on Eliquis, prior CVA, aortic valve regurgitation and osteoarthritis, and is status post hysterectomy. ?She had onset of symptoms on November 08, 2021.  She says she was feeling fine prior to that and had walked 2 miles the day before.  On that day she developed diarrhea and abdominal cramping she said lasted for couple of days, resolved and then she developed constipation which has persisted ever since .She has been requiring MiraLAX daily, and says she still will not have a bowel movement unless she takes a suppository.  Along with the abdominal cramping and diarrhea she developed fatigue which has persisted and has been severe She describes it as "exhaustion".  She has also developed exertional dyspnea which has persisted.  She says she was not having any difficulty with shortness of breath prior to that time and now has to pace herself with everything that she does and sit down frequently to rest. ?She had undergone work-up after onset and tested negative for COVID-19 and influenza ?She has been evaluated by cardiology/Dr. Percival Spanish.  Chest x-ray had been unremarkable, no evidence of PE on CTA of the chest, head CT was negative.  He does not feel that her symptoms are cardiac, however she is to start a Holter monitor later this week. ?She also has appointment tomorrow with pulmonary for further evaluation. ?Recent labs on 01/08/2022 with normal CBC and c-Met ?Thyroid panel in February unremarkable, BNP had been negative, [place]  and troponin negative with ER evaluation. ? ?She has not had any abdominal imaging.  She was told that she had a "virus" ? ? ?Review of Systems Pertinent positive and negative review of systems were noted in the above HPI section.  All other review of systems was otherwise negative.  ? ?Outpatient Encounter Medications as of 02/05/2022  ?Medication Sig  ? acetaminophen (TYLENOL) 500 MG tablet Take 1,000 mg by mouth every 6 (six) hours as needed for mild pain or moderate pain.  ? Calcium Carbonate-Vitamin D (CALCIUM + D PO) Take 1 tablet by mouth at bedtime.  ? Cholecalciferol 50 MCG (2000 UT) TABS Take by mouth.  ? diltiazem (CARDIZEM CD) 180 MG 24 hr capsule Take 1 capsule (180 mg total) by mouth daily.  ? ELIQUIS 5 MG TABS tablet TAKE 1 TABLET(5 MG) BY MOUTH TWICE DAILY  ? fluticasone (FLONASE) 50 MCG/ACT nasal spray Place 2 sprays into both nostrils daily.  ? furosemide (LASIX) 20 MG tablet TAKE 40 MG (2 TABLETS) FOR 3 DAYS, THEN DECREASE TO 20 MG DAILY  ? Multiple Vitamin (MULTIVITAMIN WITH MINERALS) TABS tablet Take 1 tablet by mouth at bedtime.  ? nitroGLYCERIN (NITROSTAT) 0.4 MG SL tablet Place 1 tablet (0.4 mg total) under the tongue as needed.  ? polyethylene glycol (MIRALAX / GLYCOLAX) 17 g packet Take 17 g by mouth daily.  ? Psyllium 500 MG CAPS Take 2 capsules by mouth daily.  ? ?No facility-administered encounter medications on file as of 02/05/2022.  ? ?Allergies  ?  Allergen Reactions  ? Shellfish Allergy Itching, Swelling and Rash  ? ?Patient Active Problem List  ? Diagnosis Date Noted  ? Dyspnea 01/08/2022  ? Aortic valve regurgitation 12/23/2021  ? Constipation 05/07/2021  ? 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 Osteoarthritis of knee 01/25/2019  ? Pain in left knee 01/07/2019  ? Atrial fibrillation ([place]) 06/05/2018  ? Knee pain, right 05/20/2018  ? History of CVA (cerebrovascular accident) 08/30/2017  ? Tear of lateral meniscus of knee 07/03/2015  ? Osteopenia 04/19/2014  ?  Arrhythmia 11/04/2013  ? Dyslipidemia (high LDL; low HDL) 09/10/2010  ? Essential hypertension 09/10/2010  ? ?Social History  ? ?Socioeconomic History  ? Marital status: Widowed  ?  Spouse name: Not on file  ? Number of children: 3  ? Years of education: Not on file  ? Highest education level: Not on file  ?Occupational History  ? Occupation: Retired  ?  Employer: RETIRED  ?  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 Smoking status: Former  ?  Types: Cigarettes  ?  Quit date: 12/17/1986  ?  Years since quitting: 35.1  ? Smokeless tobacco: Never  ? Tobacco comments:  ?  Smoke briefly in the distant past  ?Vaping Use  ? Vaping Use: Never used  ?Substance and Sexual Activity  ? Alcohol use: No  ? Drug use: No  ? Sexual activity: Not Currently  ?Other Topics Concern  ? Not on file  ?Social History Narrative  ? Nessa lives at home alone since her husband passed in April 2019. She has three sons and three grandsons.None live locally but she gets to talk with them often and sees them pretty regularly. 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  She enjoys having lunch out with friends regularly. She plans to move to a  ? ?Social Determinants of Health  ? ?Financial Resource Strain: Low Risk   ? Difficulty of Paying Living Expenses: Not hard at all  ?Food Insecurity: No Food Insecurity  ? Worried About Charity fundraiser in the Last Year: Never true  ? Ran Out of Food in the Last Year: Never true  ?Transportation Needs: No Transportation Needs  ? Lack of Transportation (Medical): No  ? Lack of Transportation (Non-Medical): No  ?Physical Activity: Inactive  ? Days of Exercise per Week: 0 days  ? Minutes of Exercise per Session: 0 min  ?Stress: No Stress Concern Present  ? Feeling of Stress : Not at all  ?Social Connections:  Moderately Integrated  ? Frequency of Communication with Friends and Family: More than three times a week  ? Frequency of Social Gatherings with Friends and Family: More than three times a week  ? Attends Religious Services: More than 4 times per year  ? Active Member of Clubs or Organizations: Yes  ? Attends Archivist Meetings: 1 to 4 times per year  ? Marital Status: Widowed  ?Intimate Partner Violence: Not At Risk  ? Fear of Current or Ex-Partner: No  ? Emotionally Abused: No  ? Physically Abused: No  ? Sexually Abused: No  ? ? ?Ms. Asato's family history includes Anemia in her mother; Diabetes in her sister; Early death in her father; Hearing loss in her mother; Heart attack in  her mother; Heart disease in her mother; Hypertension in her father, mother, sister, and son; Stroke (age of onset: 20) in her father; Stroke (age of onset: 26) in her sister; Stroke (age of onset: 19) in her mother. ? ? ?   ?Objective:  ?  ?Vitals:  ? 02/05/22 1424  ?BP: (!) 148/60  ?Pulse: 80  ? ? ?Physical Exam Well-developed well-nourished  elderly WF  in no acute distress.  Accompanied by her friend Weight, 184 BMI 31.6 ? ?HEENT; nontraumatic normocephalic, EOMI, PE R LA, sclera anicteric.  Mild dyspnea going from chair to lying down on exam table ?Oropharynx; not examined today, ?Neck; supple, no JVD ?Cardiovascular; regular rate and rhythm with S1-S2, no murmur rub or gallop ?Pulmonary; Clear bilaterally ?Abdomen; soft, obese, no focal tenderness, somewhat full feeling right lower abdomen, nondistended, no palpable mass or hepatosplenomegaly, bowel sounds are active ?Rectal; no fecal impaction, decreased anal sphincter tone, soft stool in the rectal vault, brown and heme-negative, external hemorrhoid ?Skin; benign exam, no jaundice rash or appreciable lesions ?Extremities; no clubbing cyanosis or edema skin warm and dry ?Neuro/Psych; alert and oriented x4, grossly nonfocal mood and affect appropriate  ? ? ? ?    ?Assessment & Plan:  ? ?#29 88 year old white female with onset of current symptoms early February 2023 abrupt onset with abdominal cramping and diarrhea lasting a few days, followed by constipation which has been pers

## 2022-02-06 ENCOUNTER — Encounter: Payer: Self-pay | Admitting: Pulmonary Disease

## 2022-02-06 ENCOUNTER — Ambulatory Visit: Payer: Medicare HMO | Admitting: Pulmonary Disease

## 2022-02-06 VITALS — BP 138/72 | HR 67 | Temp 97.7°F | Ht 64.0 in | Wt 185.0 lb

## 2022-02-06 DIAGNOSIS — R0602 Shortness of breath: Secondary | ICD-10-CM

## 2022-02-06 DIAGNOSIS — R5381 Other malaise: Secondary | ICD-10-CM

## 2022-02-06 MED ORDER — ALBUTEROL SULFATE HFA 108 (90 BASE) MCG/ACT IN AERS: 2.0000 | INHALATION_SPRAY | Freq: Four times a day (QID) | RESPIRATORY_TRACT | 1 refills | Status: DC | PRN

## 2022-02-06 NOTE — Progress Notes (Signed)
? ? ?Subjective:  ? ?PATIENT ID: Nancy Mccormick GENDER: female DOB: 1933-11-15, MRN: [id number] ? ? ?HPI ? ?Chief Complaint  ?Patient presents with  ? Consult  ?  Very SOB and very fatigued with any exertion. No cough or wheezing.  ? ? ?Reason for Visit: New consult for shortness of breath ? ?[name] is a 88 year old female former smoker with HTN, atrial fibrillation, aortic regurgitation, osteopenia, hx CVA who presents for evaluation for shortness of breath ? ?She was recently seen in Cardiology clinic on 01/27/22 for atrial fibrillation. Also reported fatigue ?She has fatigue since Feb when she had GI virus. She reports worsening shortness of breath with exertion during this time. No cough or wheezing. She was previously very active before Feb, walking 2 miles three times a week. Has coughed briefly only with pollen and this has resolved. Denies chronic respiratory issues, no asthma or chronic bronchitis. Had covid in December 2022 but recovered well afterwards with no limitation in activity. ? ?Social History: ?Quit 35 years. 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 ?I have personally reviewed patient's past medical/family/social history, allergies, current medications. ? ?Past Medical History:  ?Diagnosis Date  ? Arthritis   ? back, left shoulder, and fingers  ? Atrial fibrillation ([place])   ? History of chicken pox   ? Hyperlipidemia   ? Hypertension   ? Osteoporosis   ? Stroke [facility])   ?  ? ?Family History  ?Problem Relation Age of Onset  ? Stroke Mother 25  ? Anemia Mother   ? Heart disease Mother   ? Hearing loss Mother   ? Heart attack Mother   ? Hypertension Mother   ? Stroke Father 79  ? Early death Father   ? Hypertension Father   ? Stroke Sister 67  ? Diabetes Sister   ? Hypertension Sister   ? Hypertension Son   ?  ? ?Social History  ? ?Occupational History  ? Occupation: Retired  ?  Employer: RETIRED  ?  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 Smoking status: Former  ?  Types: Cigarettes   ?  Quit date: 12/17/1986  ?  Years since quitting: 35.1  ? Smokeless tobacco: Never  ? Tobacco comments:  ?  Smoke briefly in the distant past  ?Vaping Use  ? Vaping Use: Never used  ?Substance and Sexual Activity  ? Alcohol use: No  ? Drug use: No  ? Sexual activity: Not Currently  ? ? ?Allergies  ?Allergen Reactions  ? Shellfish Allergy Itching, Swelling and Rash  ?  ? ?Outpatient Medications Prior to Visit  ?Medication Sig Dispense Refill  ? acetaminophen (TYLENOL) 500 MG tablet Take 1,000 mg by mouth every 6 (six) hours as needed for mild pain or moderate pain.    ? Calcium Carbonate-Vitamin D (CALCIUM + D PO) Take 1 tablet by mouth at bedtime.    ? Cholecalciferol 50 MCG (2000 UT) TABS Take by mouth. (Patient not taking: Reported on 02/06/2022)    ? diltiazem (CARDIZEM CD) 180 MG 24 hr capsule Take 1 capsule (180 mg total) by mouth daily. 90 capsule 3  ? ELIQUIS 5 MG TABS tablet TAKE 1 TABLET(5 MG) BY MOUTH TWICE DAILY 180 tablet 1  ? fluticasone (FLONASE) 50 MCG/ACT nasal spray Place 2 sprays into both nostrils daily. 16 g 6  ? furosemide (LASIX) 20 MG tablet TAKE 40 MG (2 TABLETS) FOR 3 DAYS, THEN DECREASE TO 20 MG DAILY 90 tablet 3  ?  Multiple Vitamin (MULTIVITAMIN WITH MINERALS) TABS tablet Take 1 tablet by mouth at bedtime.    ? nitroGLYCERIN (NITROSTAT) 0.4 MG SL tablet Place 1 tablet (0.4 mg total) under the tongue as needed. 25 tablet 11  ? polyethylene glycol (MIRALAX / GLYCOLAX) 17 g packet Take 17 g by mouth daily.    ? Psyllium 500 MG CAPS Take 2 capsules by mouth daily.    ? ?No facility-administered medications prior to visit.  ? ? ?Review of Systems  ?Constitutional:  Negative for chills, diaphoresis, fever, malaise/fatigue and weight loss.  ?HENT:  Negative for congestion.   ?Respiratory:  Positive for shortness of breath. Negative for cough, hemoptysis, sputum production and wheezing.   ?Cardiovascular:  Negative for chest pain, palpitations and leg swelling.  ? ? ?Objective:  ? ?Vitals:  ?  02/06/22 0941  ?BP: 138/72  ?Pulse: 67  ?Temp: 97.7 ?F (36.5 ?C)  ?TempSrc: Oral  ?SpO2: 98%  ?Weight: 185 lb (83.9 kg)  ?Height: '5\' 4"'$  (1.626 m)  ? ?SpO2: 98 % (RA) ? ?Physical Exam: ?General: Well-appearing, no acute distress ?HENT: [place], AT ?Eyes: EOMI, no scleral icterus ?Respiratory: Clear to auscultation bilaterally.  No crackles, wheezing or rales ?Cardiovascular: RRR, -M/R/G, no JVD ?Extremities:-Edema,-tenderness ?Neuro: AAO x4, CNII-XII grossly intact ?Psych: Normal mood, normal affect ? ?Data Reviewed: ? ?Imaging: ?CXR 01/01/22 - no acute issues ? ?PFT: ?None of file ? ?Labs: ?CBC ?   ?Component Value Date/Time  ? WBC 9.3 01/08/2022 1052  ? RBC 4.48 01/08/2022 1052  ? HGB 13.1 01/08/2022 1052  ? HGB 13.3 03/28/2019 0936  ? HCT 40.2 01/08/2022 1052  ? HCT 40.3 03/28/2019 0936  ? PLT 313.0 01/08/2022 1052  ? PLT 343 03/28/2019 0936  ? MCV 89.8 01/08/2022 1052  ? MCV 87 03/28/2019 0936  ? MCH 28.8 01/01/2022 1930  ? MCHC 32.6 01/08/2022 1052  ? RDW 15.1 01/08/2022 1052  ? RDW 13.3 03/28/2019 0936  ? LYMPHSABS 3.6 01/01/2022 1930  ? LYMPHSABS 2.5 03/28/2019 0936  ? MONOABS 0.9 01/01/2022 1930  ? EOSABS 0.3 01/01/2022 1930  ? EOSABS 0.2 03/28/2019 0936  ? BASOSABS 0.1 01/01/2022 1930  ? BASOSABS 0.1 03/28/2019 0936  ? ? ?   ?Assessment & Plan:  ? ?Discussion: ?88 year old female former smoker with HTN, atrial fibrillation, aortic regurgitation, osteopenia, hx CVA who presents for evaluation for shortness of breath. Suspect she has developed deconditioning related to her GI illness. Low suspicion for primary pulmonary etiology based on history and available chest imaging however will obtain spirometry to rule out underlying obstructive lung disease that could benefit from long-acting bronchodilators. We discussed restarting regular activity as tolerated with goal for 30 minutes 3-5 days a week. ? ?Shortness of breath ?Deconditioning ?--START Albuterol AS NEEDED for shortness of breath or wheezing. Do not take if  your heart is racing as this medication can increase your heart rate ?--ORDER pulmonary function test (ONLY spirometry before and after) ?--REFER to pulmonary rehab ?--Planning for heart monitor after her GI and work-up ? ?Health Maintenance ?Immunization History  ?Administered Date(s) Administered  ? PFIZER(Purple Top)SARS-COV-2 Vaccination 10/26/2019, 11/16/2019, 08/14/2020, 05/01/2021  ? Pneumococcal Conjugate-13 12/08/2013  ? Pneumococcal Polysaccharide-23 11/11/2016  ? Td 06/02/2011  ? Tdap 06/02/2011, 08/26/2018  ? Zoster, Live 04/15/2012  ? ? ?Orders Placed This Encounter  ?Procedures  ? AMB referral to pulmonary rehabilitation  ?  Referral Priority:   Routine  ?  Referral Type:   Consultation  ?  Number of Visits Requested:  1  ? Pulmonary function test  ?  Standing Status:   Future  ?  Standing Expiration Date:   02/07/2023  ?  Order Specific Question:   Where should this test be performed?  ?  Answer:   [facility]  ? ?Meds ordered this encounter  ?Medications  ? albuterol (VENTOLIN HFA) 108 (90 Base) MCG/ACT inhaler  ?  Sig: Inhale 2 puffs into the lungs every 6 (six) hours as needed for wheezing or shortness of breath.  ?  Dispense:  8 g  ?  Refill:  1  ? ? ?No follow-ups on file. After PFTs ? ?I have spent a total time of 45-minutes on the day of the appointment reviewing prior documentation, coordinating care and discussing medical diagnosis and plan with the patient/family. Imaging, labs and tests included in this note have been reviewed and interpreted independently by me. ? ?Hanan Moen Rodman Pickle, MD ?[facility] ?02/06/2022 10:36 AM  ?Office Number [phone] ? ? ?

## 2022-02-06 NOTE — Progress Notes (Signed)
Addendum: Reviewed and agree with assessment and management plan. Aneesha Holloran M, MD  

## 2022-02-06 NOTE — Patient Instructions (Signed)
Shortness of breath ?Deconditioning ?--START Albuterol AS NEEDED for shortness of breath or wheezing. Do not take if your heart is racing as this medication can increase your heart rate ?--ORDER pulmonary function test (ONLY spirometry before and after) ?--REFER to pulmonary rehab ?--Planning for heart monitor after her GI and work-up ?--Encourage regular daily aerobic activity as tolerated ? ?Follow-up with me or NP after PFTs ?

## 2022-02-13 ENCOUNTER — Ambulatory Visit (HOSPITAL_COMMUNITY)
Admission: RE | Admit: 2022-02-13 | Discharge: 2022-02-13 | Disposition: A | Discharge status: Home / Self Care | Payer: Medicare HMO | Source: Ambulatory Visit | Attending: Physician Assistant | Admitting: Physician Assistant

## 2022-02-13 DIAGNOSIS — R194 Change in bowel habit: Secondary | ICD-10-CM | POA: Insufficient documentation

## 2022-02-13 DIAGNOSIS — R0602 Shortness of breath: Secondary | ICD-10-CM | POA: Diagnosis present

## 2022-02-13 DIAGNOSIS — K5900 Constipation, unspecified: Secondary | ICD-10-CM | POA: Diagnosis present

## 2022-02-13 DIAGNOSIS — R5383 Other fatigue: Secondary | ICD-10-CM | POA: Insufficient documentation

## 2022-02-13 MED ORDER — IOHEXOL 300 MG/ML  SOLN
100.0000 mL | Freq: Once | INTRAMUSCULAR | Status: AC | PRN
  Administered 2022-02-13: 100 mL via INTRAVENOUS

## 2022-02-20 ENCOUNTER — Telehealth (HOSPITAL_COMMUNITY): Payer: Self-pay

## 2022-02-20 NOTE — Telephone Encounter (Signed)
Pt insurance is active and benefits verified through [facility]. Co-pay $20.00, DED $0.00/$0.00 met, out of pocket $2,000.00/$395.00 met, co-insurance 0%. No pre-authorization required. Ken/[employer], 02/20/22 @ 10:53AM, CHE#[id number]   Will contact patient to see if she is interested in the Pulmonary Rehab Program.

## 2022-02-20 NOTE — Telephone Encounter (Signed)
Called patient to see if she is interested in the Pulmonary Rehab Program. Patient expressed interest. Explained scheduling process and went over insurance, patient verbalized understanding. 

## 2022-03-04 ENCOUNTER — Telehealth (HOSPITAL_COMMUNITY): Payer: Self-pay | Admitting: Family Medicine

## 2022-03-10 ENCOUNTER — Encounter: Payer: Self-pay | Admitting: Physician Assistant

## 2022-03-10 ENCOUNTER — Ambulatory Visit: Payer: Medicare HMO | Admitting: Physician Assistant

## 2022-03-10 VITALS — BP 162/80 | HR 76 | Ht 64.0 in | Wt 186.0 lb

## 2022-03-10 DIAGNOSIS — K5790 Diverticulosis of intestine, part unspecified, without perforation or abscess without bleeding: Secondary | ICD-10-CM | POA: Diagnosis not present

## 2022-03-10 DIAGNOSIS — K5909 Other constipation: Secondary | ICD-10-CM

## 2022-03-10 MED ORDER — LINACLOTIDE 145 MCG PO CAPS: 145.0000 ug | ORAL_CAPSULE | Freq: Every day | ORAL | 11 refills | Status: DC

## 2022-03-10 NOTE — Patient Instructions (Signed)
If you are age 86 or older, your body mass index should be between 23-30. Your Body mass index is 31.93 kg/m. If this is out of the aforementioned range listed, please consider follow up with your Primary Care Provider. ________________________________________________________  The [facility] providers would like to encourage you to use [facility] to communicate with providers for non-urgent requests or questions.  Due to long hold times on the telephone, sending your provider a message by [facility] may be a faster and more efficient way to get a response.  Please allow 48 business hours for a response.  Please remember that this is for non-urgent requests.  _______________________________________________________  START Linzess 145 mcg 1 capsule daily before breakfast *We have given you samples today, let us know how it is working for you prior to running out.  You may continue Miralax daily if needed  Continue fiber supplement   Drink 50-60 ounces of water daily.  Thank you for entrusting me with your care and choosing [facility].  Amy Esterwood, PA-C

## 2022-03-10 NOTE — Progress Notes (Signed)
Subjective:    Patient ID: Nancy Mccormick, female    DOB: [birth year], 88 y.o.   MRN: [id number]  HPI [name] is a pleasant 88 year old white female, established with Dr. Hilarie Fredrickson, who comes in today for follow-up.  She was last seen in the office on 02/05/2022 by myself, with change in bowel habits over the prior 2 months with new onset of constipation. She had had 1 very remote colonoscopy probably 30 years ago and says that was negative.  She has history of prior CVA, atrial fibrillation for which she is on Eliquis, hypertension, aortic valve regurgitation, osteoarthritis and is status post hysterectomy. She had onset of recurrent symptoms early February 2023 and says she was feeling fine prior to that time walking 2 miles per day.  She developed a diarrheal illness with abdominal cramping that lasted for a few days, resolved and ever since then she has been significantly constipated.  She had been taking MiraLAX on a daily basis but says she still will not have a bowel movement without using a suppository.  Patient also developed profound fatigue and exertional dyspnea around the same time.  She has recently been evaluated by pulmonary, is awaiting pulmonary function tests and enrollment with pulmonary rehab.  Initial notes indicate symptoms likely secondary to deconditioning. We did CT of the abdomen and pelvis after her last office visit which did show sigmoid diverticulosis but no evidence of diverticulitis and was an otherwise negative exam.  She was then given a MiraLAX purge which she says works really well but then she goes back to being significantly constipated until she does another purge.  No current complaints of abdominal pain no nausea or vomiting.  She says even with the suppository sometimes she will just pass a very small amount of stool.  Recent TSH was normal.  Review of Systems Pertinent positive and negative review of systems were noted in the above HPI section.  All other  review of systems was otherwise negative.   Outpatient Encounter Medications as of 03/10/2022  Medication Sig   acetaminophen (TYLENOL) 500 MG tablet Take 1,000 mg by mouth every 6 (six) hours as needed for mild pain or moderate pain.   Calcium Carbonate-Vitamin D (CALCIUM + D PO) Take 1 tablet by mouth at bedtime.   Cholecalciferol 50 MCG (2000 UT) TABS Take by mouth.   diltiazem (CARDIZEM CD) 180 MG 24 hr capsule Take 1 capsule (180 mg total) by mouth daily.   ELIQUIS 5 MG TABS tablet TAKE 1 TABLET(5 MG) BY MOUTH TWICE DAILY   fluticasone (FLONASE) 50 MCG/ACT nasal spray Place 2 sprays into both nostrils daily.   linaclotide (LINZESS) 145 MCG CAPS capsule Take 1 capsule (145 mcg total) by mouth daily before breakfast.   Multiple Vitamin (MULTIVITAMIN WITH MINERALS) TABS tablet Take 1 tablet by mouth at bedtime.   nitroGLYCERIN (NITROSTAT) 0.4 MG SL tablet Place 1 tablet (0.4 mg total) under the tongue as needed.   polyethylene glycol (MIRALAX / GLYCOLAX) 17 g packet Take 17 g by mouth daily.   Psyllium 500 MG CAPS Take 2 capsules by mouth daily.   [DISCONTINUED] albuterol (VENTOLIN HFA) 108 (90 Base) MCG/ACT inhaler Inhale 2 puffs into the lungs every 6 (six) hours as needed for wheezing or shortness of breath.   [DISCONTINUED] furosemide (LASIX) 20 MG tablet TAKE 40 MG (2 TABLETS) FOR 3 DAYS, THEN DECREASE TO 20 MG DAILY   No facility-administered encounter medications on file as of 03/10/2022.   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/11/2019   Osteoarthritis of knee 01/25/2019   Pain in left knee 01/07/2019   Atrial fibrillation ([place]) 06/05/2018   Knee pain, right 05/20/2018   History of CVA (cerebrovascular  accident) 08/30/2017   Tear of lateral meniscus of knee 07/03/2015   Osteopenia 04/19/2014   Arrhythmia 11/04/2013   Dyslipidemia (high LDL; low HDL) 09/10/2010   Essential hypertension 09/10/2010   Social History   Socioeconomic History   Marital status: Widowed    Spouse name: Not on file   Number of children: 3   Years of education: Not on file   Highest education level: Not on file  Occupational History   Occupation: Retired    Fish farm manager: RETIRED    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 She has three sons and three grandsons.None live locally but she gets to talk with them often and sees them pretty regularly. 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  She enjoys having lunch out with friends regularly. She plans to move to a   Social Determinants of Radio broadcast assistant Strain: Low Risk    Difficulty of Paying Living Expenses: Not hard at all  Food Insecurity: No Food Insecurity   Worried About Charity fundraiser in the Last Year: Never true   Arboriculturist in the Last Year: Never true  Transportation Needs: No Transportation Needs   Lack of Transportation (Medical): No   Lack of Transportation (Non-Medical): No  Physical Activity: Inactive   Days of Exercise per Week: 0 days   Minutes of Exercise per Session: 0 min   Stress: No Stress Concern Present   Feeling of Stress : Not at all  Social Connections: Moderately Integrated   Frequency of Communication with Friends and Family: More than three times a week   Frequency of Social Gatherings with Friends and Family: More than three times a week   Attends Religious Services: More than 4 times per year   Active Member of [employer] or Organizations: Yes   Attends Archivist Meetings: 1 to 4 times per year   Marital Status: Widowed  Intimate Partner Violence: Not At Risk   Fear of Current or Ex-Partner: No   Emotionally Abused: No   Physically Abused: No   Sexually Abused: No    Ms. Hejl's family history includes Anemia in her mother; Diabetes in her sister; Early death in her father; Hearing loss in her  mother; Heart attack in her mother; Heart disease in her mother; Hypertension in her father, mother, sister, and son; Stroke (age of onset: 81) in her father; Stroke (age of onset: 39) in her sister; Stroke (age of onset: 7) in her mother.      Objective:    Vitals:   03/10/22 0920  BP: (!) 162/80  Pulse: 76    Physical Exam Well-developed well-nourished elderly white female in no acute distress.  Height, Weight, 186 BMI 31.9  HEENT; nontraumatic normocephalic, EOMI, PE R LA, sclera anicteric. Oropharynx; examined today Neck; supple, no JVD Cardiovascular; regular rate and rhythm with S1-S2, no murmur rub or gallop Pulmonary; Clear bilaterally Abdomen; soft, nontender, nondistended, no palpable mass or hepatosplenomegaly, bowel sounds are active Rectal; not done today Skin; benign exam, no jaundice rash or appreciable lesions Extremities; no clubbing cyanosis or edema skin warm and dry Neuro/Psych; alert and oriented x4, grossly nonfocal mood and affect appropriate        Assessment & Plan:   #66 88 year old white female with new onset of significant constipation after an illness with diarrhea and abdominal pain in February  2023 CT of the abdomen pelvis negative other than diverticulosis sigmoid colon Query postinfectious IBS-C  has had good response with bowel purge as but is not having good response with daily MiraLAX.  #2 several month history of significant fatigue and exertional dyspnea  #3 hypertension #4.  History of atrial fibrillation-on Eliquis #5.  History of prior CVA  Plan;  we will start a trial of Linzess 145 mcg daily.  Patient was given samples today, have also sent a prescription.  I have asked her to try this for 7 to 10 days.  If she does not have good response or has side effects I have asked her to call and speak to my nurse and we will adjust dose or try another agent. Initially have asked her to continue MiraLAX 17 g in 8 ounces of water daily, then taper off if Linzess is helping Continue liberal fluid intake 50 to 60 ounces of water per day  Alfredia Ferguson PA-C 03/10/2022   Cc: Vivi Barrack, MD

## 2022-03-14 ENCOUNTER — Telehealth: Payer: Self-pay | Admitting: Physician Assistant

## 2022-03-14 NOTE — Telephone Encounter (Signed)
Spoke with the patient. She has completely stopped using Miralax. She is finding the Linzess 145 mcg to be "a little too effective." She is unable to leave the home in the mornings due to the loose stool Linzess is creating. Patient requests to try the Linzess 72 mcg. Samples of this will be supplied. Patient agrees to call with follow up next week.

## 2022-03-14 NOTE — Telephone Encounter (Signed)
Inbound cal from patient stating she was wanting to speak with the nurse about Linzess. Patient was advised to call and give an update. Please advise.

## 2022-03-14 NOTE — Progress Notes (Signed)
Addendum: Reviewed and agree with assessment and management plan. Lucrezia Dehne M, MD  

## 2022-03-19 ENCOUNTER — Encounter: Payer: Self-pay | Admitting: *Deleted

## 2022-03-27 ENCOUNTER — Other Ambulatory Visit: Payer: Self-pay

## 2022-03-27 MED ORDER — LUBIPROSTONE 8 MCG PO CAPS: 8.0000 ug | ORAL_CAPSULE | Freq: Two times a day (BID) | ORAL | 0 refills | Status: DC

## 2022-03-27 NOTE — Telephone Encounter (Signed)
Esterwood, Amy S, PA-C  You 1 hour ago (10:59 AM)    I would like to give her a trial of low dose Amitiza 8 mcg BID  #60- send rx for BID but ask her to start with just one dose daily and see if that is effective  first- if not then can go up to BID - if helps then can call in a longer term RX       Recommendations and instructions given to the patient. She is aware the insurance may require PA. Encouraged to call her pharmacy before going to assure the medication is ready for pick up. She agrees to call us with follow up.

## 2022-03-27 NOTE — Telephone Encounter (Signed)
Patient calling to give follow up.  Linzess 72 mcg is also causing "projectile diarrhea." She has tried taking it every other day. The days she takes it, she has diarrhea. Then days she does not take it, she does not have a bowel movement. She also takes fiber (psyllium husks.) Please advise.

## 2022-03-31 ENCOUNTER — Telehealth: Payer: Self-pay | Admitting: Pharmacy Technician

## 2022-04-01 ENCOUNTER — Ambulatory Visit (INDEPENDENT_AMBULATORY_CARE_PROVIDER_SITE_OTHER): Payer: Medicare HMO | Admitting: Pulmonary Disease

## 2022-04-01 ENCOUNTER — Encounter: Payer: Self-pay | Admitting: Pulmonary Disease

## 2022-04-01 ENCOUNTER — Ambulatory Visit: Payer: Medicare HMO | Admitting: Pulmonary Disease

## 2022-04-01 VITALS — BP 118/74 | HR 77 | Ht 64.0 in | Wt 187.2 lb

## 2022-04-01 DIAGNOSIS — R5381 Other malaise: Secondary | ICD-10-CM

## 2022-04-01 DIAGNOSIS — R0602 Shortness of breath: Secondary | ICD-10-CM | POA: Diagnosis not present

## 2022-04-01 LAB — PULMONARY FUNCTION TEST
DL/VA % pred: 99 %
DL/VA: 4.03 ml/min/mmHg/L
DLCO cor % pred: 94 %
DLCO cor: 17.48 ml/min/mmHg
DLCO unc % pred: 94 %
DLCO unc: 17.48 ml/min/mmHg
FEF 25-75 Post: 2.05 L/sec
FEF 25-75 Pre: 2.45 L/sec
FEF2575-%Change-Post: -16 %
FEF2575-%Pred-Post: 178 %
FEF2575-%Pred-Pre: 213 %
FEV1-%Change-Post: -8 %
FEV1-%Pred-Post: 120 %
FEV1-%Pred-Pre: 131 %
FEV1-Post: 2.12 L
FEV1-Pre: 2.31 L
FEV1FVC-%Change-Post: 0 %
FEV1FVC-%Pred-Pre: 114 %
FEV6-%Change-Post: -7 %
FEV6-%Pred-Post: 114 %
FEV6-%Pred-Pre: 123 %
FEV6-Post: 2.56 L
FEV6-Pre: 2.78 L
FEV6FVC-%Pred-Post: 106 %
FEV6FVC-%Pred-Pre: 106 %
FVC-%Change-Post: -7 %
FVC-%Pred-Post: 107 %
FVC-%Pred-Pre: 116 %
FVC-Post: 2.56 L
FVC-Pre: 2.78 L
Post FEV1/FVC ratio: 83 %
Post FEV6/FVC ratio: 100 %
Pre FEV1/FVC ratio: 83 %
Pre FEV6/FVC Ratio: 100 %
RV % pred: 86 %
RV: 2.16 L
TLC % pred: 99 %
TLC: 5.01 L

## 2022-04-03 ENCOUNTER — Other Ambulatory Visit: Payer: Self-pay | Admitting: *Deleted

## 2022-04-03 MED ORDER — APIXABAN 5 MG PO TABS: ORAL_TABLET | ORAL | 1 refills | Status: DC

## 2022-04-14 ENCOUNTER — Telehealth (HOSPITAL_COMMUNITY): Payer: Self-pay | Admitting: *Deleted

## 2022-04-14 ENCOUNTER — Other Ambulatory Visit: Payer: Self-pay | Admitting: Family Medicine

## 2022-04-14 DIAGNOSIS — Z1231 Encounter for screening mammogram for malignant neoplasm of breast: Secondary | ICD-10-CM

## 2022-04-21 ENCOUNTER — Encounter (HOSPITAL_COMMUNITY): Payer: Self-pay

## 2022-04-21 ENCOUNTER — Encounter (HOSPITAL_COMMUNITY)
Admission: RE | Admit: 2022-04-21 | Discharge: 2022-04-21 | Disposition: A | Discharge status: Home / Self Care | Payer: Medicare HMO | Source: Ambulatory Visit | Attending: Pulmonary Disease | Admitting: Pulmonary Disease

## 2022-04-21 VITALS — BP 138/60 | HR 74 | Resp 20 | Ht 63.5 in | Wt 189.2 lb

## 2022-04-21 DIAGNOSIS — R0602 Shortness of breath: Secondary | ICD-10-CM | POA: Insufficient documentation

## 2022-04-21 DIAGNOSIS — Z5189 Encounter for other specified aftercare: Secondary | ICD-10-CM | POA: Diagnosis not present

## 2022-04-21 NOTE — Progress Notes (Signed)
Pulmonary Individual Treatment Plan  Patient Details  Name: Nancy Mccormick MRN: [id number] Date of Birth: 01/15/1934 Referring Provider:   April Manson Pulmonary Rehab Walk Test from 04/21/2022 in [place]  Referring Provider Chi Elzie Rings, MD       Initial Encounter Date:  Flowsheet Row Pulmonary Rehab Walk Test from 04/21/2022 in [place]  Date 04/21/22       Visit Diagnosis: Shortness of breath  Patient's Home Medications on Admission:   Current Outpatient Medications:    acetaminophen (TYLENOL) 500 MG tablet, Take 1,000 mg by mouth every 6 (six) hours as needed for mild pain or moderate pain., Disp: , Rfl:    apixaban (ELIQUIS) 5 MG TABS tablet, TAKE 1 TABLET(5 MG) BY MOUTH TWICE DAILY, Disp: 180 tablet, Rfl: 1   Calcium Carbonate-Vitamin D (CALCIUM + D PO), Take 1 tablet by mouth at bedtime., Disp: , Rfl:    Cholecalciferol 50 MCG (2000 UT) TABS, Take by mouth., Disp: , Rfl:    diltiazem (CARDIZEM CD) 180 MG 24 hr capsule, Take 1 capsule (180 mg total) by mouth daily., Disp: 90 capsule, Rfl: 3   fluticasone (FLONASE) 50 MCG/ACT nasal spray, Place 2 sprays into both nostrils daily., Disp: 16 g, Rfl: 6   lubiprostone (AMITIZA) 8 MCG capsule, Take 1 capsule (8 mcg total) by mouth 2 (two) times daily with a meal., Disp: 60 capsule, Rfl: 0   Multiple Vitamin (MULTIVITAMIN WITH MINERALS) TABS tablet, Take 1 tablet by mouth at bedtime., Disp: , Rfl:    nitroGLYCERIN (NITROSTAT) 0.4 MG SL tablet, Place 1 tablet (0.4 mg total) under the tongue as needed., Disp: 25 tablet, Rfl: 11   polyethylene glycol (MIRALAX / GLYCOLAX) 17 g packet, Take 17 g by mouth daily., Disp: , Rfl:    Psyllium 500 MG CAPS, Take 2 capsules by mouth daily., Disp: , Rfl:   Past Medical History: Past Medical History:  Diagnosis Date   Arthritis    back, left shoulder, and fingers   Atrial fibrillation ([place])    History of chicken pox     Hyperlipidemia    Hypertension    Osteoporosis    Stroke ([place])     Tobacco Use: Social History   Tobacco Use  Smoking Status Former   Types: Cigarettes   Quit date: 12/17/1986   Years since quitting: 35.3  Smokeless Tobacco Never  Tobacco Comments   Smoke briefly in the distant past    Labs: Review Flowsheet  More data exists      Latest Ref Rng & Units 08/18/2018 03/28/2019 07/11/2019 01/09/2020 10/31/2020  Labs for ITP Cardiac and Pulmonary Rehab  Cholestrol 0 - 200 mg/dL 222  203  197  178  184   LDL (calc) 0 - 99 mg/dL 101  93  97  79  78   HDL-C >39.00 mg/dL 88  78  69.70  70.80  69.20   Trlycerides 0.0 - 149.0 mg/dL 163  158  148.0  142.0  181.0     Capillary Blood Glucose: No results found for: "GLUCAP"   Pulmonary Assessment Scores:  Pulmonary Assessment Scores     Row Name 04/21/22 0958         ADL UCSD   ADL Phase Entry     SOB Score total 78       CAT Score   CAT Score pre 22       mMRC Score   mMRC Score 4  UCSD: Self-administered rating of dyspnea associated with activities of daily living (ADLs) 6-point scale (0 = "not at all" to 5 = "maximal or unable to do because of breathlessness")  Scoring Scores range from 0 to 120.  Minimally important difference is 5 units  CAT: CAT can identify the health impairment of COPD patients and is better correlated with disease progression.  CAT has a scoring range of zero to 40. The CAT score is classified into four groups of low (less than 10), medium (10 - 20), high (21-30) and very high (31-40) based on the impact level of disease on health status. A CAT score over 10 suggests significant symptoms.  A worsening CAT score could be explained by an exacerbation, poor medication adherence, poor inhaler technique, or progression of COPD or comorbid conditions.  CAT MCID is 2 points  mMRC: mMRC (Modified Medical Research Council) Dyspnea Scale is used to assess the degree of baseline functional  disability in patients of respiratory disease due to dyspnea. No minimal important difference is established. A decrease in score of 1 point or greater is considered a positive change.   Pulmonary Function Assessment:  Pulmonary Function Assessment - 04/21/22 0957       Breath   Bilateral Breath Sounds Clear    Shortness of Breath Yes;Limiting activity             Exercise Target Goals: Exercise Program Goal: Individual exercise prescription set using results from initial 6 min walk test and THRR while considering  patient's activity barriers and safety.   Exercise Prescription Goal: Initial exercise prescription builds to 30-45 minutes a day of aerobic activity, 2-3 days per week.  Home exercise guidelines will be given to patient during program as part of exercise prescription that the participant will acknowledge.  Activity Barriers & Risk Stratification:  Activity Barriers & Cardiac Risk Stratification - 04/21/22 0947       Activity Barriers & Cardiac Risk Stratification   Activity Barriers History of Falls;Shortness of Breath;Joint Problems;Arthritis;Other (comment);Balance Concerns    Comments generalized bilat knee issues    Cardiac Risk Stratification Moderate             6 Minute Walk:  6 Minute Walk     Row Name 04/21/22 0852         6 Minute Walk   Phase Initial     Distance 636 feet     Walk Time 6 minutes  Pt took 2 breaks: 3:20-4:14 and 5:20 to 6:00 due to elevated HR     # of Rest Breaks 2  Total of 2 breaks:  93 seconds     MPH 1.58     METS 1.64     RPE 13     Perceived Dyspnea  2     VO2 Peak 5.75     Symptoms Yes (comment)     Comments SOB, RPD = 2. PT exceeding THR     Resting HR 74 bpm     Resting BP 138/60     Resting Oxygen Saturation  95 %     Exercise Oxygen Saturation  during 6 min walk 96 %     Max Ex. HR 150 bpm     Max Ex. BP 152/80     2 Minute Post BP 144/80       Interval HR   1 Minute HR 102     2 Minute HR 125      3 Minute HR 142  4 Minute HR 92     5 Minute HR 150     6 Minute HR 91     2 Minute Post HR 71     Interval Heart Rate? Yes       Interval Oxygen   Interval Oxygen? Yes     Baseline Oxygen Saturation % 95 %     1 Minute Oxygen Saturation % 93 %     1 Minute Liters of Oxygen 0 L     2 Minute Oxygen Saturation % 93 %     2 Minute Liters of Oxygen 0 L     3 Minute Oxygen Saturation % 92 %     3 Minute Liters of Oxygen 0 L     4 Minute Oxygen Saturation % 95 %     4 Minute Liters of Oxygen 0 L     5 Minute Oxygen Saturation % 93 %     5 Minute Liters of Oxygen 0 L     6 Minute Oxygen Saturation % 96 %     6 Minute Liters of Oxygen 0 L     2 Minute Post Oxygen Saturation % 97 %     2 Minute Post Liters of Oxygen 0 L              Oxygen Initial Assessment:  Oxygen Initial Assessment - 04/21/22 0957       Home Oxygen   Home Oxygen Device None    Sleep Oxygen Prescription None    Home Exercise Oxygen Prescription None    Home Resting Oxygen Prescription None      Initial 6 min Walk   Oxygen Used None      Program Oxygen Prescription   Program Oxygen Prescription None      Intervention   Short Term Goals To learn and understand importance of monitoring SPO2 with pulse oximeter and demonstrate accurate use of the pulse oximeter.;To learn and understand importance of maintaining oxygen saturations>88%;To learn and demonstrate proper pursed lip breathing techniques or other breathing techniques.              Oxygen Re-Evaluation:   Oxygen Discharge (Final Oxygen Re-Evaluation):   Initial Exercise Prescription:  Initial Exercise Prescription - 04/21/22 1500       Date of Initial Exercise RX and Referring Provider   Date 04/21/22    Referring Provider Chi Elzie Rings, MD    Expected Discharge Date 06/26/22      NuStep   Level 1    SPM 75    Minutes 30    METs 1.6      Prescription Details   Frequency (times per week) 2    Duration Progress to 30  minutes of continuous aerobic without signs/symptoms of physical distress      Intensity   THRR 40-80% of Max Heartrate 54-108    Ratings of Perceived Exertion 11-13    Perceived Dyspnea 0-4      Progression   Progression Continue progressive overload as per policy without signs/symptoms or physical distress.      Resistance Training   Training Prescription Yes    Weight Yellow bands    Reps 10-15             Perform Capillary Blood Glucose checks as needed.  Exercise Prescription Changes:   Exercise Comments:   Exercise Goals and Review:   Exercise Goals     Row Name 04/21/22 1550  Exercise Goals   Increase Physical Activity Yes       Intervention Provide advice, education, support and counseling about physical activity/exercise needs.;Develop an individualized exercise prescription for aerobic and resistive training based on initial evaluation findings, risk stratification, comorbidities and participant's personal goals.       Expected Outcomes Short Term: Attend rehab on a regular basis to increase amount of physical activity.;Long Term: Add in home exercise to make exercise part of routine and to increase amount of physical activity.;Long Term: Exercising regularly at least 3-5 days a week.       Increase Strength and Stamina Yes       Intervention Provide advice, education, support and counseling about physical activity/exercise needs.;Develop an individualized exercise prescription for aerobic and resistive training based on initial evaluation findings, risk stratification, comorbidities and participant's personal goals.       Expected Outcomes Long Term: Improve cardiorespiratory fitness, muscular endurance and strength as measured by increased METs and functional capacity (6MWT);Short Term: Perform resistance training exercises routinely during rehab and add in resistance training at home;Short Term: Increase workloads from initial exercise prescription  for resistance, speed, and METs.       Able to understand and use rate of perceived exertion (RPE) scale Yes       Intervention Provide education and explanation on how to use RPE scale       Expected Outcomes Short Term: Able to use RPE daily in rehab to express subjective intensity level;Long Term:  Able to use RPE to guide intensity level when exercising independently       Able to understand and use Dyspnea scale Yes       Intervention Provide education and explanation on how to use Dyspnea scale       Expected Outcomes Short Term: Able to use Dyspnea scale daily in rehab to express subjective sense of shortness of breath during exertion;Long Term: Able to use Dyspnea scale to guide intensity level when exercising independently       Knowledge and understanding of Target Heart Rate Range (THRR) Yes       Intervention Provide education and explanation of THRR including how the numbers were predicted and where they are located for reference       Expected Outcomes Short Term: Able to state/look up THRR;Short Term: Able to use daily as guideline for intensity in rehab;Long Term: Able to use THRR to govern intensity when exercising independently       Understanding of Exercise Prescription Yes       Intervention Provide education, explanation, and written materials on patient's individual exercise prescription       Expected Outcomes Short Term: Able to explain program exercise prescription;Long Term: Able to explain home exercise prescription to exercise independently                Exercise Goals Re-Evaluation :   Discharge Exercise Prescription (Final Exercise Prescription Changes):   Nutrition:  Target Goals: Understanding of nutrition guidelines, daily intake of sodium '1500mg'$ , cholesterol '200mg'$ , calories 30% from fat and 7% or less from saturated fats, daily to have 5 or more servings of fruits and vegetables.  Biometrics:  Pre Biometrics - 04/21/22 1019       Pre Biometrics    Grip Strength 20 kg              Nutrition Therapy Plan and Nutrition Goals:   Nutrition Assessments:  MEDIFICTS Score Key: ?70 Need to make dietary changes  40-70 Heart  Healthy Diet ? 40 Therapeutic Level Cholesterol Diet   Picture Your Plate Scores: <16 Unhealthy dietary pattern with much room for improvement. 41-50 Dietary pattern unlikely to meet recommendations for good health and room for improvement. 51-60 More healthful dietary pattern, with some room for improvement.  >60 Healthy dietary pattern, although there may be some specific behaviors that could be improved.    Nutrition Goals Re-Evaluation:   Nutrition Goals Discharge (Final Nutrition Goals Re-Evaluation):   Psychosocial: Target Goals: Acknowledge presence or absence of significant depression and/or stress, maximize coping skills, provide positive support system. Participant is able to verbalize types and ability to use techniques and skills needed for reducing stress and depression.  Initial Review & Psychosocial Screening:  Initial Psych Review & Screening - 04/21/22 0937       Initial Review   Current issues with None Identified      Family Dynamics   Good Support System? Yes    Comments Lives in assistive living and feels supported by friends, faith community and family      Barriers   Psychosocial barriers to participate in program There are no identifiable barriers or psychosocial needs.      Screening Interventions   Interventions Encouraged to exercise             Quality of Life Scores:  Scores of 19 and below usually indicate a poorer quality of life in these areas.  A difference of  2-3 points is a clinically meaningful difference.  A difference of 2-3 points in the total score of the Quality of Life Index has been associated with significant improvement in overall quality of life, self-image, physical symptoms, and general health in studies assessing change in quality of  life.  PHQ-9: Review Flowsheet  More data exists      04/21/2022 12/23/2021 05/07/2021 12/17/2020 07/12/2020  Depression screen PHQ 2/9  Decreased Interest 0 0 0 0 0  Down, Depressed, Hopeless 0 0 0 0 0  PHQ - 2 Score 0 0 0 0 0  Altered sleeping 1 - - - -  Tired, decreased energy 0 - - - -  Change in appetite 1 - - - -  Feeling bad or failure about yourself  0 - - - -  Trouble concentrating 0 - - - -  Moving slowly or fidgety/restless 0 - - - -  Suicidal thoughts 0 - - - -  PHQ-9 Score 2 - - - -  Difficult doing work/chores Somewhat difficult - - - -   Interpretation of Total Score  Total Score Depression Severity:  1-4 = Minimal depression, 5-9 = Mild depression, 10-14 = Moderate depression, 15-19 = Moderately severe depression, 20-27 = Severe depression   Psychosocial Evaluation and Intervention:   Psychosocial Re-Evaluation:   Psychosocial Discharge (Final Psychosocial Re-Evaluation):   Education: Education Goals: Education classes will be provided on a weekly basis, covering required topics. Participant will state understanding/return demonstration of topics presented.  Learning Barriers/Preferences:  Learning Barriers/Preferences - 04/21/22 0946       Learning Barriers/Preferences   Learning Barriers Sight;Hearing    Learning Preferences Individual Instruction;Group Instruction;Written Material;Verbal Instruction             Education Topics: Risk Factor Reduction:  -Group instruction that is supported by a PowerPoint presentation. Instructor discusses the definition of a risk factor, different risk factors for pulmonary disease, and how the heart and lungs work together.     Nutrition for Pulmonary Patient:  -Group instruction provided  by PowerPoint slides, verbal discussion, and written materials to support subject matter. The instructor gives an explanation and review of healthy diet recommendations, which includes a discussion on weight management,  recommendations for fruit and vegetable consumption, as well as protein, fluid, caffeine, fiber, sodium, sugar, and alcohol. Tips for eating when patients are short of breath are discussed.   Pursed Lip Breathing:  -Group instruction that is supported by demonstration and informational handouts. Instructor discusses the benefits of pursed lip and diaphragmatic breathing and detailed demonstration on how to preform both.     Oxygen Safety:  -Group instruction provided by PowerPoint, verbal discussion, and written material to support subject matter. There is an overview of "What is Oxygen" and "Why do we need it".  Instructor also reviews how to create a safe environment for oxygen use, the importance of using oxygen as prescribed, and the risks of noncompliance. There is a brief discussion on traveling with oxygen and resources the patient may utilize.   Oxygen Equipment:  -Group instruction provided by [facility] Staff utilizing handouts, written materials, and equipment demonstrations.   Signs and Symptoms:  -Group instruction provided by written material and verbal discussion to support subject matter. Warning signs and symptoms of infection, stroke, and heart attack are reviewed and when to call the physician/911 reinforced. Tips for preventing the spread of infection discussed.   Advanced Directives:  -Group instruction provided by verbal instruction and written material to support subject matter. Instructor reviews Advanced Directive laws and proper instruction for filling out document.   Pulmonary Video:  -Group video education that reviews the importance of medication and oxygen compliance, exercise, good nutrition, pulmonary hygiene, and pursed lip and diaphragmatic breathing for the pulmonary patient.   Exercise for the Pulmonary Patient:  -Group instruction that is supported by a PowerPoint presentation. Instructor discusses benefits of exercise, core components of exercise,  frequency, duration, and intensity of an exercise routine, importance of utilizing pulse oximetry during exercise, safety while exercising, and options of places to exercise outside of rehab.     Pulmonary Medications:  -Verbally interactive group education provided by instructor with focus on inhaled medications and proper administration.   Anatomy and Physiology of the Respiratory System and Intimacy:  -Group instruction provided by PowerPoint, verbal discussion, and written material to support subject matter. Instructor reviews respiratory cycle and anatomical components of the respiratory system and their functions. Instructor also reviews differences in obstructive and restrictive respiratory diseases with examples of each. Intimacy, Sex, and Sexuality differences are reviewed with a discussion on how relationships can change when diagnosed with pulmonary disease. Common sexual concerns are reviewed.   MD DAY -A group question and answer session with a medical doctor that allows participants to ask questions that relate to their pulmonary disease state.   OTHER EDUCATION -Group or individual verbal, written, or video instructions that support the educational goals of the pulmonary rehab program.   Holiday Eating Survival Tips:  -Group instruction provided by PowerPoint slides, verbal discussion, and written materials to support subject matter. The instructor gives patients tips, tricks, and techniques to help them not only survive but enjoy the holidays despite the onslaught of food that accompanies the holidays.   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             Core Components/Risk Factors/Patient Goals Review:    Core Components/Risk Factors/Patient Goals at Discharge (Final Review):    ITP Comments:  ITP Comments     Row Name 04/21/22 1104           ITP Comments Pulmonary Rehab Medical Director - Dr. Loanne Drilling                Dr. Rodman Pickle is Medical Director for Pulmonary Rehab at [facility] RN, BSN Cardiac and Pulmonary Rehab Nurse Navigator

## 2022-04-21 NOTE — Progress Notes (Signed)
Nancy Mccormick 88 y.o. female Pulmonary Rehab Orientation Note Nancy Mccormick who was referred to Pulmonary rehab by Dr. Loanne Drilling with the diagnosis of Shortness of breath arrived today in Cardiac and Pulmonary Rehab. She arrived ambulatory with steady normal gait. She does not carry portable oxygen.  Per pt,  she uses oxygen never. Color good, skin warm and dry. Patient is oriented to time and place. Patient's medical history, psychosocial health, and medications reviewed. Psychosocial assessment reveals pt lives alone in assistive living complex. 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 Pt reports her stress level is none.  Nancy Mccormick remarks that she just goes with the flow and doesn't let anything bother her.  Pt does not exhibit signs of depression. PHQ2/9 score 0/2. Pt shows good  coping skills with positive outlook.  Will continue to monitor and evaluate progress toward psychosocial goal(s) of weight loss and increased energy to do the activities she did prior to contracting a "virus". Physical assessment reveals heart rate is  irregular , breath sounds clear to auscultation, no wheezes, rales, or rhonchi. Grip strength equal, strong. Distal pulses palpable. Patient reports she does take medications as prescribed. Patient states she follows a Regular diet. The patient reports no specific efforts to gain or lose weight. However she would like to lose some weight.  Patient's weight will be monitored closely. Demonstration and practice of PLB using pulse oximeter. Patient able to return demonstration satisfactorily. Safety and hand hygiene in the exercise area reviewed with patient. Patient voices understanding of the information reviewed. Department expectations discussed with patient and achievable goals were set. The patient shows enthusiasm about attending the program and we look forward to working with this nice lady. [name], BSN Cardiac and Training and development officer

## 2022-04-23 NOTE — Progress Notes (Signed)
Pulmonary Individual Treatment Plan  Patient Details  Name: Nancy Mccormick MRN: [id number] Date of Birth: 20-Jan-1934 Referring Provider:   April Manson Pulmonary Rehab Walk Test from 04/21/2022 in [place]  Referring Provider Chi Elzie Rings, MD       Initial Encounter Date:  Flowsheet Row Pulmonary Rehab Walk Test from 04/21/2022 in [place]  Date 04/21/22       Visit Diagnosis: Shortness of breath  Patient's Home Medications on Admission:   Current Outpatient Medications:    acetaminophen (TYLENOL) 500 MG tablet, Take 1,000 mg by mouth every 6 (six) hours as needed for mild pain or moderate pain., Disp: , Rfl:    apixaban (ELIQUIS) 5 MG TABS tablet, TAKE 1 TABLET(5 MG) BY MOUTH TWICE DAILY, Disp: 180 tablet, Rfl: 1   Calcium Carbonate-Vitamin D (CALCIUM + D PO), Take 1 tablet by mouth at bedtime., Disp: , Rfl:    Cholecalciferol 50 MCG (2000 UT) TABS, Take by mouth., Disp: , Rfl:    diltiazem (CARDIZEM CD) 180 MG 24 hr capsule, Take 1 capsule (180 mg total) by mouth daily., Disp: 90 capsule, Rfl: 3   fluticasone (FLONASE) 50 MCG/ACT nasal spray, Place 2 sprays into both nostrils daily., Disp: 16 g, Rfl: 6   lubiprostone (AMITIZA) 8 MCG capsule, Take 1 capsule (8 mcg total) by mouth 2 (two) times daily with a meal., Disp: 60 capsule, Rfl: 0   Multiple Vitamin (MULTIVITAMIN WITH MINERALS) TABS tablet, Take 1 tablet by mouth at bedtime., Disp: , Rfl:    nitroGLYCERIN (NITROSTAT) 0.4 MG SL tablet, Place 1 tablet (0.4 mg total) under the tongue as needed., Disp: 25 tablet, Rfl: 11   polyethylene glycol (MIRALAX / GLYCOLAX) 17 g packet, Take 17 g by mouth daily., Disp: , Rfl:    Psyllium 500 MG CAPS, Take 2 capsules by mouth daily., Disp: , Rfl:   Past Medical History: Past Medical History:  Diagnosis Date   Arthritis    back, left shoulder, and fingers   Atrial fibrillation ([place])    History of chicken pox     Hyperlipidemia    Hypertension    Osteoporosis    Stroke ([place])     Tobacco Use: Social History   Tobacco Use  Smoking Status Former   Types: Cigarettes   Quit date: 12/17/1986   Years since quitting: 35.3  Smokeless Tobacco Never  Tobacco Comments   Smoke briefly in the distant past    Labs: Review Flowsheet  More data exists      Latest Ref Rng & Units 08/18/2018 03/28/2019 07/11/2019 01/09/2020 10/31/2020  Labs for ITP Cardiac and Pulmonary Rehab  Cholestrol 0 - 200 mg/dL 222  203  197  178  184   LDL (calc) 0 - 99 mg/dL 101  93  97  79  78   HDL-C >39.00 mg/dL 88  78  69.70  70.80  69.20   Trlycerides 0.0 - 149.0 mg/dL 163  158  148.0  142.0  181.0     Capillary Blood Glucose: No results found for: "GLUCAP"   Pulmonary Assessment Scores:  Pulmonary Assessment Scores     Row Name 04/21/22 0958         ADL UCSD   ADL Phase Entry     SOB Score total 78       CAT Score   CAT Score pre 22       mMRC Score   mMRC Score 4  UCSD: Self-administered rating of dyspnea associated with activities of daily living (ADLs) 6-point scale (0 = "not at all" to 5 = "maximal or unable to do because of breathlessness")  Scoring Scores range from 0 to 120.  Minimally important difference is 5 units  CAT: CAT can identify the health impairment of COPD patients and is better correlated with disease progression.  CAT has a scoring range of zero to 40. The CAT score is classified into four groups of low (less than 10), medium (10 - 20), high (21-30) and very high (31-40) based on the impact level of disease on health status. A CAT score over 10 suggests significant symptoms.  A worsening CAT score could be explained by an exacerbation, poor medication adherence, poor inhaler technique, or progression of COPD or comorbid conditions.  CAT MCID is 2 points  mMRC: mMRC (Modified Medical Research Council) Dyspnea Scale is used to assess the degree of baseline functional  disability in patients of respiratory disease due to dyspnea. No minimal important difference is established. A decrease in score of 1 point or greater is considered a positive change.   Pulmonary Function Assessment:  Pulmonary Function Assessment - 04/21/22 0957       Breath   Bilateral Breath Sounds Clear    Shortness of Breath Yes;Limiting activity             Exercise Target Goals: Exercise Program Goal: Individual exercise prescription set using results from initial 6 min walk test and THRR while considering  patient's activity barriers and safety.   Exercise Prescription Goal: Initial exercise prescription builds to 30-45 minutes a day of aerobic activity, 2-3 days per week.  Home exercise guidelines will be given to patient during program as part of exercise prescription that the participant will acknowledge.  Activity Barriers & Risk Stratification:  Activity Barriers & Cardiac Risk Stratification - 04/21/22 0947       Activity Barriers & Cardiac Risk Stratification   Activity Barriers History of Falls;Shortness of Breath;Joint Problems;Arthritis;Other (comment);Balance Concerns    Comments generalized bilat knee issues    Cardiac Risk Stratification Moderate             6 Minute Walk:  6 Minute Walk     Row Name 04/21/22 0852         6 Minute Walk   Phase Initial     Distance 636 feet     Walk Time 6 minutes  Pt took 2 breaks: 3:20-4:14 and 5:20 to 6:00 due to elevated HR     # of Rest Breaks 2  Total of 2 breaks:  93 seconds     MPH 1.58     METS 1.64     RPE 13     Perceived Dyspnea  2     VO2 Peak 5.75     Symptoms Yes (comment)     Comments SOB, RPD = 2. PT exceeding THR     Resting HR 74 bpm     Resting BP 138/60     Resting Oxygen Saturation  95 %     Exercise Oxygen Saturation  during 6 min walk 96 %     Max Ex. HR 150 bpm     Max Ex. BP 152/80     2 Minute Post BP 144/80       Interval HR   1 Minute HR 102     2 Minute HR 125      3 Minute HR 142  4 Minute HR 92     5 Minute HR 150     6 Minute HR 91     2 Minute Post HR 71     Interval Heart Rate? Yes       Interval Oxygen   Interval Oxygen? Yes     Baseline Oxygen Saturation % 95 %     1 Minute Oxygen Saturation % 93 %     1 Minute Liters of Oxygen 0 L     2 Minute Oxygen Saturation % 93 %     2 Minute Liters of Oxygen 0 L     3 Minute Oxygen Saturation % 92 %     3 Minute Liters of Oxygen 0 L     4 Minute Oxygen Saturation % 95 %     4 Minute Liters of Oxygen 0 L     5 Minute Oxygen Saturation % 93 %     5 Minute Liters of Oxygen 0 L     6 Minute Oxygen Saturation % 96 %     6 Minute Liters of Oxygen 0 L     2 Minute Post Oxygen Saturation % 97 %     2 Minute Post Liters of Oxygen 0 L              Oxygen Initial Assessment:  Oxygen Initial Assessment - 04/22/22 0914       Intervention   Long  Term Goals Exhibits compliance with exercise, home  and travel O2 prescription;Verbalizes importance of monitoring SPO2 with pulse oximeter and return demonstration;Maintenance of O2 saturations>88%;Exhibits proper breathing techniques, such as pursed lip breathing or other method taught during program session             Oxygen Re-Evaluation:  Oxygen Re-Evaluation     Row Name 04/22/22 0914             Program Oxygen Prescription   Program Oxygen Prescription None         Home Oxygen   Home Oxygen Device None       Sleep Oxygen Prescription None       Home Exercise Oxygen Prescription None       Home Resting Oxygen Prescription None         Goals/Expected Outcomes   Short Term Goals To learn and understand importance of monitoring SPO2 with pulse oximeter and demonstrate accurate use of the pulse oximeter.;To learn and understand importance of maintaining oxygen saturations>88%;To learn and demonstrate proper pursed lip breathing techniques or other breathing techniques.        Goals/Expected Outcomes Compliance and understanding of  oxygen saturation monitoring and breathing techniques to decrease shortness of breath.                Oxygen Discharge (Final Oxygen Re-Evaluation):  Oxygen Re-Evaluation - 04/22/22 0914       Program Oxygen Prescription   Program Oxygen Prescription None      Home Oxygen   Home Oxygen Device None    Sleep Oxygen Prescription None    Home Exercise Oxygen Prescription None    Home Resting Oxygen Prescription None      Goals/Expected Outcomes   Short Term Goals To learn and understand importance of monitoring SPO2 with pulse oximeter and demonstrate accurate use of the pulse oximeter.;To learn and understand importance of maintaining oxygen saturations>88%;To learn and demonstrate proper pursed lip breathing techniques or other breathing techniques.     Goals/Expected Outcomes Compliance and understanding  of oxygen saturation monitoring and breathing techniques to decrease shortness of breath.             Initial Exercise Prescription:  Initial Exercise Prescription - 04/21/22 1500       Date of Initial Exercise RX and Referring Provider   Date 04/21/22    Referring Provider Chi Elzie Rings, MD    Expected Discharge Date 06/26/22      NuStep   Level 1    SPM 75    Minutes 30    METs 1.6      Prescription Details   Frequency (times per week) 2    Duration Progress to 30 minutes of continuous aerobic without signs/symptoms of physical distress      Intensity   THRR 40-80% of Max Heartrate 54-108    Ratings of Perceived Exertion 11-13    Perceived Dyspnea 0-4      Progression   Progression Continue progressive overload as per policy without signs/symptoms or physical distress.      Resistance Training   Training Prescription Yes    Weight Yellow bands    Reps 10-15             Perform Capillary Blood Glucose checks as needed.  Exercise Prescription Changes:   Exercise Comments:   Exercise Goals and Review:   Exercise Goals     Row Name  04/21/22 1550 04/22/22 0913           Exercise Goals   Increase Physical Activity Yes Yes      Intervention Provide advice, education, support and counseling about physical activity/exercise needs.;Develop an individualized exercise prescription for aerobic and resistive training based on initial evaluation findings, risk stratification, comorbidities and participant's personal goals. Provide advice, education, support and counseling about physical activity/exercise needs.;Develop an individualized exercise prescription for aerobic and resistive training based on initial evaluation findings, risk stratification, comorbidities and participant's personal goals.      Expected Outcomes Short Term: Attend rehab on a regular basis to increase amount of physical activity.;Long Term: Add in home exercise to make exercise part of routine and to increase amount of physical activity.;Long Term: Exercising regularly at least 3-5 days a week. Short Term: Attend rehab on a regular basis to increase amount of physical activity.;Long Term: Add in home exercise to make exercise part of routine and to increase amount of physical activity.;Long Term: Exercising regularly at least 3-5 days a week.      Increase Strength and Stamina Yes Yes      Intervention Provide advice, education, support and counseling about physical activity/exercise needs.;Develop an individualized exercise prescription for aerobic and resistive training based on initial evaluation findings, risk stratification, comorbidities and participant's personal goals. Provide advice, education, support and counseling about physical activity/exercise needs.;Develop an individualized exercise prescription for aerobic and resistive training based on initial evaluation findings, risk stratification, comorbidities and participant's personal goals.      Expected Outcomes Long Term: Improve cardiorespiratory fitness, muscular endurance and strength as measured by  increased METs and functional capacity (6MWT);Short Term: Perform resistance training exercises routinely during rehab and add in resistance training at home;Short Term: Increase workloads from initial exercise prescription for resistance, speed, and METs. Long Term: Improve cardiorespiratory fitness, muscular endurance and strength as measured by increased METs and functional capacity (6MWT);Short Term: Perform resistance training exercises routinely during rehab and add in resistance training at home;Short Term: Increase workloads from initial exercise prescription for resistance, speed, and METs.  Able to understand and use rate of perceived exertion (RPE) scale Yes Yes      Intervention Provide education and explanation on how to use RPE scale Provide education and explanation on how to use RPE scale      Expected Outcomes Short Term: Able to use RPE daily in rehab to express subjective intensity level;Long Term:  Able to use RPE to guide intensity level when exercising independently Short Term: Able to use RPE daily in rehab to express subjective intensity level;Long Term:  Able to use RPE to guide intensity level when exercising independently      Able to understand and use Dyspnea scale Yes Yes      Intervention Provide education and explanation on how to use Dyspnea scale Provide education and explanation on how to use Dyspnea scale      Expected Outcomes Short Term: Able to use Dyspnea scale daily in rehab to express subjective sense of shortness of breath during exertion;Long Term: Able to use Dyspnea scale to guide intensity level when exercising independently Short Term: Able to use Dyspnea scale daily in rehab to express subjective sense of shortness of breath during exertion;Long Term: Able to use Dyspnea scale to guide intensity level when exercising independently      Knowledge and understanding of Target Heart Rate Range (THRR) Yes Yes      Intervention Provide education and explanation  of THRR including how the numbers were predicted and where they are located for reference Provide education and explanation of THRR including how the numbers were predicted and where they are located for reference      Expected Outcomes Short Term: Able to state/look up THRR;Short Term: Able to use daily as guideline for intensity in rehab;Long Term: Able to use THRR to govern intensity when exercising independently Short Term: Able to state/look up THRR;Short Term: Able to use daily as guideline for intensity in rehab;Long Term: Able to use THRR to govern intensity when exercising independently      Understanding of Exercise Prescription Yes Yes      Intervention Provide education, explanation, and written materials on patient's individual exercise prescription Provide education, explanation, and written materials on patient's individual exercise prescription      Expected Outcomes Short Term: Able to explain program exercise prescription;Long Term: Able to explain home exercise prescription to exercise independently Short Term: Able to explain program exercise prescription;Long Term: Able to explain home exercise prescription to exercise independently               Exercise Goals Re-Evaluation :  Exercise Goals Re-Evaluation     [place] Name 04/22/22 0913             Exercise Goal Re-Evaluation   Exercise Goals Review Increase Physical Activity;Increase Strength and Stamina;Able to understand and use rate of perceived exertion (RPE) scale;Able to understand and use Dyspnea scale;Knowledge and understanding of Target Heart Rate Range (THRR);Understanding of Exercise Prescription       Comments Pt is scheduled to begin exercise next week. Will continue to monitor and progress as tolerated.       Expected Outcomes Through exercise at rehab and home, the patient will decrease shortness of breath with daily activities and feel confident in carrying out an exercise regimen at home.                 Discharge Exercise Prescription (Final Exercise Prescription Changes):   Nutrition:  Target Goals: Understanding of nutrition guidelines, daily intake of sodium '1500mg'$ ,  cholesterol '200mg'$ , calories 30% from fat and 7% or less from saturated fats, daily to have 5 or more servings of fruits and vegetables.  Biometrics:  Pre Biometrics - 04/21/22 1019       Pre Biometrics   Grip Strength 20 kg              Nutrition Therapy Plan and Nutrition Goals:   Nutrition Assessments:  MEDIFICTS Score Key: ?70 Need to make dietary changes  40-70 Heart Healthy Diet ? 40 Therapeutic Level Cholesterol Diet   Picture Your Plate Scores: <70 Unhealthy dietary pattern with much room for improvement. 41-50 Dietary pattern unlikely to meet recommendations for good health and room for improvement. 51-60 More healthful dietary pattern, with some room for improvement.  >60 Healthy dietary pattern, although there may be some specific behaviors that could be improved.    Nutrition Goals Re-Evaluation:   Nutrition Goals Discharge (Final Nutrition Goals Re-Evaluation):   Psychosocial: Target Goals: Acknowledge presence or absence of significant depression and/or stress, maximize coping skills, provide positive support system. Participant is able to verbalize types and ability to use techniques and skills needed for reducing stress and depression.  Initial Review & Psychosocial Screening:  Initial Psych Review & Screening - 04/21/22 0937       Initial Review   Current issues with None Identified      Family Dynamics   Good Support System? Yes    Comments Lives in assistive living and feels supported by friends, faith community and family      Barriers   Psychosocial barriers to participate in program There are no identifiable barriers or psychosocial needs.      Screening Interventions   Interventions Encouraged to exercise             Quality of Life Scores:  Scores  of 19 and below usually indicate a poorer quality of life in these areas.  A difference of  2-3 points is a clinically meaningful difference.  A difference of 2-3 points in the total score of the Quality of Life Index has been associated with significant improvement in overall quality of life, self-image, physical symptoms, and general health in studies assessing change in quality of life.  PHQ-9: Review Flowsheet  More data exists      04/21/2022 12/23/2021 05/07/2021 12/17/2020 07/12/2020  Depression screen PHQ 2/9  Decreased Interest 0 0 0 0 0  Down, Depressed, Hopeless 0 0 0 0 0  PHQ - 2 Score 0 0 0 0 0  Altered sleeping 1 - - - -  Tired, decreased energy 0 - - - -  Change in appetite 1 - - - -  Feeling bad or failure about yourself  0 - - - -  Trouble concentrating 0 - - - -  Moving slowly or fidgety/restless 0 - - - -  Suicidal thoughts 0 - - - -  PHQ-9 Score 2 - - - -  Difficult doing work/chores Somewhat difficult - - - -   Interpretation of Total Score  Total Score Depression Severity:  1-4 = Minimal depression, 5-9 = Mild depression, 10-14 = Moderate depression, 15-19 = Moderately severe depression, 20-27 = Severe depression   Psychosocial Evaluation and Intervention:   Psychosocial Re-Evaluation:  Psychosocial Re-Evaluation     Row Name 04/22/22 0928             Psychosocial Re-Evaluation   Current issues with None Identified       Comments No change in psychosocial  assessment since orientation. She begins exercising in 1 week.       Expected Outcomes For Jamey to continue to be without barriers or psychosocial concerns while participating in pulmonary rehab       Interventions Encouraged to attend Pulmonary Rehabilitation for the exercise       Continue Psychosocial Services  No Follow up required                Psychosocial Discharge (Final Psychosocial Re-Evaluation):  Psychosocial Re-Evaluation - 04/22/22 2297       Psychosocial Re-Evaluation    Current issues with None Identified    Comments No change in psychosocial assessment since orientation. She begins exercising in 1 week.    Expected Outcomes For Kaarin to continue to be without barriers or psychosocial concerns while participating in pulmonary rehab    Interventions Encouraged to attend Pulmonary Rehabilitation for the exercise    Continue Psychosocial Services  No Follow up required             Education: Education Goals: Education classes will be provided on a weekly basis, covering required topics. Participant will state understanding/return demonstration of topics presented.  Learning Barriers/Preferences:  Learning Barriers/Preferences - 04/21/22 0946       Learning Barriers/Preferences   Learning Barriers Sight;Hearing    Learning Preferences Individual Instruction;Group Instruction;Written Material;Verbal Instruction             Education Topics: Risk Factor Reduction:  -Group instruction that is supported by a PowerPoint presentation. Instructor discusses the definition of a risk factor, different risk factors for pulmonary disease, and how the heart and lungs work together.     Nutrition for Pulmonary Patient:  -Group instruction provided by PowerPoint slides, verbal discussion, and written materials to support subject matter. The instructor gives an explanation and review of healthy diet recommendations, which includes a discussion on weight management, recommendations for fruit and vegetable consumption, as well as protein, fluid, caffeine, fiber, sodium, sugar, and alcohol. Tips for eating when patients are short of breath are discussed.   Pursed Lip Breathing:  -Group instruction that is supported by demonstration and informational handouts. Instructor discusses the benefits of pursed lip and diaphragmatic breathing and detailed demonstration on how to preform both.     Oxygen Safety:  -Group instruction provided by PowerPoint, verbal  discussion, and written material to support subject matter. There is an overview of "What is Oxygen" and "Why do we need it".  Instructor also reviews how to create a safe environment for oxygen use, the importance of using oxygen as prescribed, and the risks of noncompliance. There is a brief discussion on traveling with oxygen and resources the patient may utilize.   Oxygen Equipment:  -Group instruction provided by [facility] Staff utilizing handouts, written materials, and equipment demonstrations.   Signs and Symptoms:  -Group instruction provided by written material and verbal discussion to support subject matter. Warning signs and symptoms of infection, stroke, and heart attack are reviewed and when to call the physician/911 reinforced. Tips for preventing the spread of infection discussed.   Advanced Directives:  -Group instruction provided by verbal instruction and written material to support subject matter. Instructor reviews Advanced Directive laws and proper instruction for filling out document.   Pulmonary Video:  -Group video education that reviews the importance of medication and oxygen compliance, exercise, good nutrition, pulmonary hygiene, and pursed lip and diaphragmatic breathing for the pulmonary patient.   Exercise for the Pulmonary Patient:  -Group instruction that is  supported by a PowerPoint presentation. Instructor discusses benefits of exercise, core components of exercise, frequency, duration, and intensity of an exercise routine, importance of utilizing pulse oximetry during exercise, safety while exercising, and options of places to exercise outside of rehab.     Pulmonary Medications:  -Verbally interactive group education provided by instructor with focus on inhaled medications and proper administration.   Anatomy and Physiology of the Respiratory System and Intimacy:  -Group instruction provided by PowerPoint, verbal discussion, and written material to  support subject matter. Instructor reviews respiratory cycle and anatomical components of the respiratory system and their functions. Instructor also reviews differences in obstructive and restrictive respiratory diseases with examples of each. Intimacy, Sex, and Sexuality differences are reviewed with a discussion on how relationships can change when diagnosed with pulmonary disease. Common sexual concerns are reviewed.   MD DAY -A group question and answer session with a medical doctor that allows participants to ask questions that relate to their pulmonary disease state.   OTHER EDUCATION -Group or individual verbal, written, or video instructions that support the educational goals of the pulmonary rehab program.   Holiday Eating Survival Tips:  -Group instruction provided by PowerPoint slides, verbal discussion, and written materials to support subject matter. The instructor gives patients tips, tricks, and techniques to help them not only survive but enjoy the holidays despite the onslaught of food that accompanies the holidays.   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             Core Components/Risk Factors/Patient Goals Review:   Goals and Risk Factor Review     Row Name 04/22/22 0930             Core Components/Risk Factors/Patient Goals Review   Personal Goals Review Weight Management/Obesity;Develop more efficient breathing techniques such as purse lipped breathing and diaphragmatic breathing and practicing  self-pacing with activity.;Increase knowledge of respiratory medications and ability to use respiratory devices properly.;Improve shortness of breath with ADL's       Review Bahar will begin exercising in 1 week, not able to work on program goals at this time.       Expected Outcomes See admission goals.                Core Components/Risk Factors/Patient Goals at Discharge (Final Review):   Goals and Risk Factor Review - 04/22/22 0930       Core Components/Risk Factors/Patient Goals Review   Personal Goals Review Weight Management/Obesity;Develop more efficient breathing techniques such as purse lipped breathing and diaphragmatic breathing and practicing self-pacing with activity.;Increase knowledge of respiratory medications and ability to use respiratory devices properly.;Improve shortness of breath with ADL's    Review Leightyn will begin exercising in 1 week, not able to work on program goals at this time.    Expected Outcomes See admission goals.             ITP Comments:  ITP Comments     Row Name 04/21/22 1104  ITP Comments Pulmonary Rehab Medical Director - Dr. Loanne Drilling                Comments:  Zephyr has completed her Pulmonary rehab orientation.She is scheduled to start her exercise sessions 04/29/22.She has a history Atrial Fib and she increased HR with activity during her walk test. A request has been sent for a increase in her THR.  Pulmonary rehab staff will continue to monitor and reassess progress toward goals during her participation in Pulmonary Rehab.   Dr. Rodman Pickle is Medical Director for Pulmonary Rehab at [facility].

## 2022-04-29 ENCOUNTER — Encounter (HOSPITAL_COMMUNITY)
Admission: RE | Admit: 2022-04-29 | Discharge: 2022-04-29 | Disposition: A | Discharge status: Home / Self Care | Payer: Medicare HMO | Source: Ambulatory Visit | Attending: Pulmonary Disease | Admitting: Pulmonary Disease

## 2022-04-29 VITALS — Wt 187.2 lb

## 2022-04-29 DIAGNOSIS — R0602 Shortness of breath: Secondary | ICD-10-CM

## 2022-04-29 DIAGNOSIS — Z5189 Encounter for other specified aftercare: Secondary | ICD-10-CM | POA: Diagnosis not present

## 2022-04-29 NOTE — Progress Notes (Signed)
Daily Session Note  Patient Details  Name: Nancy Mccormick MRN: [id number] Date of Birth: [birth year] Referring Provider:   April Manson Pulmonary Rehab Walk Test from 04/21/2022 in [place]  Referring Provider Darcella Cheshire, MD       Encounter Date: 04/29/2022  Check In:  Session Check In - 04/29/22 1329       Check-In   Supervising physician immediately available to respond to emergencies [facility] immediately available    Physician(s) Dr. Horris Latino    Location [facility]    Staff Present Lesly Rubenstein, MS, ACSM-CEP, CCRP, Exercise Physiologist;Joan Leonia Reeves, RN, BSN;Lisa Clarisa Schools, MS, ACSM-CEP, Exercise Physiologist    Virtual Visit No    Medication changes reported     No    Fall or balance concerns reported    No    Tobacco Cessation No Change    Warm-up and Cool-down Performed as group-led instruction    Resistance Training Performed Yes    VAD Patient? No    PAD/SET Patient? No      Pain Assessment   Currently in Pain? No/denies    Pain Score 0-No pain    Multiple Pain Sites No             Capillary Blood Glucose: No results found for this or any previous visit (from the past 24 hour(s)).   Exercise Prescription Changes - 04/29/22 1500       Response to Exercise   Blood Pressure (Admit) 100/62    Blood Pressure (Exercise) 126/70    Blood Pressure (Exit) 118/60    Heart Rate (Admit) 67 bpm    Heart Rate (Exercise) 96 bpm    Heart Rate (Exit) 83 bpm    Oxygen Saturation (Admit) 95 %    Oxygen Saturation (Exercise) 92 %    Oxygen Saturation (Exit) 94 %    Rating of Perceived Exertion (Exercise) 11    Perceived Dyspnea (Exercise) 0    Duration Progress to 30 minutes of  aerobic without signs/symptoms of physical distress    Intensity THRR unchanged      Resistance Training   Training Prescription Yes    Weight red bands    Reps 10-15    Time 10 Minutes      NuStep   Level  1    SPM 70    Minutes 30    METs 1.6             Social History   Tobacco Use  Smoking Status Former   Types: Cigarettes   Quit date: 12/17/1986   Years since quitting: 35.3  Smokeless Tobacco Never  Tobacco Comments   Smoke briefly in the distant past    Goals Met:  Proper associated with RPD/PD & O2 Sat Exercise tolerated well No report of concerns or symptoms today Strength training completed today  Goals Unmet:  Not Applicable  Comments: Service time is from 1320 to 1438.    Dr. Rodman Pickle is Medical Director for Pulmonary Rehab at [facility].

## 2022-04-30 ENCOUNTER — Ambulatory Visit
Admission: RE | Admit: 2022-04-30 | Discharge: 2022-04-30 | Disposition: A | Discharge status: Home / Self Care | Payer: Medicare HMO | Source: Ambulatory Visit | Attending: Family Medicine | Admitting: Family Medicine

## 2022-04-30 DIAGNOSIS — Z1231 Encounter for screening mammogram for malignant neoplasm of breast: Secondary | ICD-10-CM

## 2022-05-01 ENCOUNTER — Encounter (HOSPITAL_COMMUNITY)
Admission: RE | Admit: 2022-05-01 | Discharge: 2022-05-01 | Disposition: A | Discharge status: Home / Self Care | Payer: Medicare HMO | Source: Ambulatory Visit | Attending: Pulmonary Disease | Admitting: Pulmonary Disease

## 2022-05-01 DIAGNOSIS — Z5189 Encounter for other specified aftercare: Secondary | ICD-10-CM | POA: Diagnosis not present

## 2022-05-01 DIAGNOSIS — R0602 Shortness of breath: Secondary | ICD-10-CM

## 2022-05-01 NOTE — Progress Notes (Signed)
Daily Session Note  Patient Details  Name: Melainie Krinsky MRN: [id number] Date of Birth: Feb 24, 1934 Referring Provider:   April Manson Pulmonary Rehab Walk Test from 04/21/2022 in [place]  Referring Provider Darcella Cheshire, MD       Encounter Date: 05/01/2022  Check In:  Session Check In - 05/01/22 1429       Check-In   Supervising physician immediately available to respond to emergencies [facility] immediately available    Physician(s) Dr. Posey Pronto    [facility]    Staff Present Rosebud Poles, RN, BSN;Lisa Ysidro Evert, Cathleen Fears, MS, ACSM-CEP, Exercise Physiologist    Virtual Visit No    Medication changes reported     No    Fall or balance concerns reported    No    Tobacco Cessation No Change    Warm-up and Cool-down Performed as group-led instruction    Resistance Training Performed Yes    VAD Patient? No    PAD/SET Patient? No      Pain Assessment   Currently in Pain? No/denies    Multiple Pain Sites No             Capillary Blood Glucose: No results found for this or any previous visit (from the past 24 hour(s)).    Social History   Tobacco Use  Smoking Status Former   Types: Cigarettes   Quit date: 12/17/1986   Years since quitting: 35.3  Smokeless Tobacco Never  Tobacco Comments   Smoke briefly in the distant past    Goals Met:  Proper associated with RPD/PD & O2 Sat Exercise tolerated well No report of concerns or symptoms today Strength training completed today  Goals Unmet:  Not Applicable  Comments: Service time is from 1316 to [place]    Dr. Rodman Pickle is Medical Director for Pulmonary Rehab at [facility].

## 2022-05-06 ENCOUNTER — Encounter (HOSPITAL_COMMUNITY)
Admission: RE | Admit: 2022-05-06 | Discharge: 2022-05-06 | Disposition: A | Discharge status: Home / Self Care | Payer: Medicare HMO | Source: Ambulatory Visit | Attending: Pulmonary Disease | Admitting: Pulmonary Disease

## 2022-05-06 DIAGNOSIS — R0602 Shortness of breath: Secondary | ICD-10-CM | POA: Diagnosis not present

## 2022-05-06 NOTE — Progress Notes (Signed)
Daily Session Note  Patient Details  Name: Nancy Mccormick MRN: [id number] Date of Birth: Jun 15, 1934 Referring Provider:   April Manson Pulmonary Rehab Walk Test from 04/21/2022 in [place]  Referring Provider Darcella Cheshire, MD       Encounter Date: 05/06/2022  Check In:  Session Check In - 05/06/22 1431       Check-In   Supervising physician immediately available to respond to emergencies [facility] immediately available    Physician(s) Dr. Posey Pronto    [facility]    Staff Present Rosebud Poles, RN, BSN;Other;Lisa Ysidro Evert, Cathleen Fears, MS, ACSM-CEP, Exercise Physiologist    Virtual Visit No    Medication changes reported     No    Fall or balance concerns reported    No    Tobacco Cessation No Change    Warm-up and Cool-down Performed as group-led instruction    Resistance Training Performed Yes    VAD Patient? No    PAD/SET Patient? No      Pain Assessment   Currently in Pain? No/denies    Multiple Pain Sites No             Capillary Blood Glucose: No results found for this or any previous visit (from the past 24 hour(s)).    Social History   Tobacco Use  Smoking Status Former   Types: Cigarettes   Quit date: 12/17/1986   Years since quitting: 35.4  Smokeless Tobacco Never  Tobacco Comments   Smoke briefly in the distant past    Goals Met:  Proper associated with RPD/PD & O2 Sat Exercise tolerated well No report of concerns or symptoms today Strength training completed today  Goals Unmet:  Not Applicable  Comments: Service time is from 1322 to 1445.    Dr. Rodman Pickle is Medical Director for Pulmonary Rehab at [facility] [place].

## 2022-05-07 ENCOUNTER — Telehealth (HOSPITAL_COMMUNITY): Payer: Self-pay | Admitting: *Deleted

## 2022-05-07 NOTE — Telephone Encounter (Signed)
-----   Message from Bruning, MD sent at 05/02/2022  5:10 PM EDT ----- Regarding: RE: Increase Target Heart Rate during exercise at Pulmonary Rehab OK for HR to go up to 180 as long as patient is asymptomatic.  Dr. Johnette Abraham ----- Message ----- From: Rowe Pavy, RN Sent: 04/22/2022   8:55 AM EDT To: [name], MD Subject: Increase Target Heart Rate during exercise a#   Dr. Loanne Drilling,  Thank you for the referral of this delightful patient.  Nancy Mccormick completed her walk test for  on yesterday.  Due to the nature of afib, which is chronic for her and well managed with Cardizem CD and Eliquis, her Hr increased above the set target heart rate of 108 (80% max HR based upon age).  We had [name] stop walking and sit x 2 for less than 1 minute to allow her HR to drop below 108 before proceeding.  Walk test HR Baseline Hr - 74 1 minute HR 102 2 minute HR 125  3 minute HR 142 4 minute HR 92 5 minute HR 150 6 minute HR 91 2 minute post walk test HR 71  Due to the expected irregularity  of afib with the potential for short burst of elevated heart rate during exercise, please indicate what is an acceptable so that this pt will be able to progress workloads as her conditioning improves.  AGE 88 80% of max HR - 108 85% of max HR 115 90% of max HR -122 95% of max HR - 128 100% max HR - 135]  Thanks so much for your valued input. [name], BSN Cardiac and Training and development officer

## 2022-05-08 ENCOUNTER — Encounter: Payer: Self-pay | Admitting: Family Medicine

## 2022-05-08 ENCOUNTER — Ambulatory Visit: Payer: Medicare HMO | Admitting: Family Medicine

## 2022-05-08 ENCOUNTER — Encounter (HOSPITAL_COMMUNITY)
Admission: RE | Admit: 2022-05-08 | Discharge: 2022-05-08 | Disposition: A | Discharge status: Home / Self Care | Payer: Medicare HMO | Source: Ambulatory Visit | Attending: Pulmonary Disease | Admitting: Pulmonary Disease

## 2022-05-08 VITALS — BP 138/80 | HR 56 | Temp 97.3°F | Ht 63.5 in | Wt 183.4 lb

## 2022-05-08 DIAGNOSIS — R0602 Shortness of breath: Secondary | ICD-10-CM | POA: Diagnosis not present

## 2022-05-08 DIAGNOSIS — I10 Essential (primary) hypertension: Secondary | ICD-10-CM

## 2022-05-08 DIAGNOSIS — K5900 Constipation, unspecified: Secondary | ICD-10-CM

## 2022-05-08 NOTE — Assessment & Plan Note (Signed)
Much better controlled today.  Continue current regimen diltiazem 180 mg daily per cardiology.

## 2022-05-08 NOTE — Progress Notes (Signed)
   Nancy Mccormick is a 88 y.o. female who presents today for an office visit.  Assessment/Plan:  Chronic Problems Addressed Today: Essential hypertension Much better controlled today.  Continue current regimen diltiazem 180 mg daily per cardiology.  Dyspnea Symptoms are much better.  She has seen pulmonology twice since her last visit was diagnosed with deconditioning.  She is working with cardiopulmonary rehab and symptoms are dramatically improving.  She will continue with rehab for several more weeks.  Constipation Has recently followed with GI.  She does not feel like the Amitiza is very effective.  She will be restarting her MiraLAX.  She will check with me in a few weeks.  If she is still having ongoing issues would consider increasing dose of Amitiza.     Subjective:  HPI:  See A/p for status of chronic conditions.  We last saw her last about 4 months ago for persistent dyspnea.  She had extensive cardiac work-up which was essentially negative.  We referred her to pulmonology for PFTs and further evaluation.  Her PFTs were normal.  It was thought her dyspnea was mostly due to deconditioning and she was referred to start pulmonary rehab.  She has done several sessions of this already and has had significant improvement in her shortness of breath.  We also had her follow back up with GI at her last visit due to ongoing constipation since suffering a viral illness earlier this year.  They started her on Linzess which was too strong.  She was switched to Amitiza which does not seem to be strong enough.  She will be trying to restart the MiraLAX soon to see if this gives her any improvement.       Objective:  Physical Exam: BP 138/80   Pulse (!) 56   Temp (!) 97.3 F (36.3 C) (Temporal)   Ht 5' 3.5" (1.613 m)   Wt 183 lb 6.4 oz (83.2 kg)   SpO2 95%   BMI 31.98 kg/m   Gen: No acute distress, resting comfortably CV: Regular rate and rhythm with no murmurs appreciated Pulm:  Normal work of breathing, clear to auscultation bilaterally with no crackles, wheezes, or rhonchi Neuro: Grossly normal, moves all extremities Psych: Normal affect and thought content      Nancy Mccormick M. Jerline Pain, MD 05/08/2022 9:54 AM

## 2022-05-08 NOTE — Patient Instructions (Signed)
It was very nice to see you today!  I am glad that you are doing better!  You can restart the MiraLAX.  Let me know if you are still having issues with constipation and we can adjust the dose of the Amitiza.  I will see back in 6 months.  Come back sooner if needed.  Take care, Dr Jerline Pain  PLEASE NOTE:  If you had any lab tests please let us know if you have not heard back within a few days. You may see your results on mychart before we have a chance to review them but we will give you a call once they are reviewed by [place]. If we ordered any referrals today, please let us know if you have not heard from their office within the next week.   Please try these tips to maintain a healthy lifestyle:  Eat at least 3 REAL meals and 1-2 snacks per day.  Aim for no more than 5 hours between eating.  If you eat breakfast, please do so within one hour of getting up.   Each meal should contain half fruits/vegetables, one quarter protein, and one quarter carbs (no bigger than a computer mouse)  Cut down on sweet beverages. This includes juice, soda, and sweet tea.   Drink at least 1 glass of water with each meal and aim for at least 8 glasses per day  Exercise at least 150 minutes every week.

## 2022-05-08 NOTE — Progress Notes (Signed)
Daily Session Note  Patient Details  Name: Nancy Mccormick MRN: [id number] Date of Birth: 06/20/1934 Referring Provider:   April Manson Pulmonary Rehab Walk Test from 04/21/2022 in [place]  Referring Provider Darcella Cheshire, MD       Encounter Date: 05/08/2022  Check In:  Session Check In - 05/08/22 1417       Check-In   Supervising physician immediately available to respond to emergencies [facility] immediately available    Physician(s) Dr. [place]    [facility]    Staff Present Rosebud Poles, RN, BSN;Randi Olen Cordial BS, ACSM-CEP, Exercise Physiologist;Kaylee Rosana Hoes, MS, ACSM-CEP, Exercise Physiologist;Ligaya Cormier Ysidro Evert, RN    Virtual Visit No    Medication changes reported     No    Fall or balance concerns reported    No    Tobacco Cessation No Change    Warm-up and Cool-down Performed as group-led instruction    Resistance Training Performed Yes    VAD Patient? No    PAD/SET Patient? No      Pain Assessment   Currently in Pain? No/denies    Multiple Pain Sites No             Capillary Blood Glucose: No results found for this or any previous visit (from the past 24 hour(s)).    Social History   Tobacco Use  Smoking Status Former   Types: Cigarettes   Quit date: 12/17/1986   Years since quitting: 35.4  Smokeless Tobacco Never  Tobacco Comments   Smoke briefly in the distant past    Goals Met:  Proper associated with RPD/PD & O2 Sat Exercise tolerated well No report of concerns or symptoms today Strength training completed today  Goals Unmet:  Not Applicable  Comments: Service time is from 1322 to [place]    Dr. Rodman Pickle is Medical Director for Pulmonary Rehab at [facility].

## 2022-05-08 NOTE — Assessment & Plan Note (Signed)
Has recently followed with GI.  She does not feel like the Amitiza is very effective.  She will be restarting her MiraLAX.  She will check with me in a few weeks.  If she is still having ongoing issues would consider increasing dose of Amitiza.

## 2022-05-08 NOTE — Assessment & Plan Note (Signed)
Symptoms are much better.  She has seen pulmonology twice since her last visit was diagnosed with deconditioning.  She is working with cardiopulmonary rehab and symptoms are dramatically improving.  She will continue with rehab for several more weeks.

## 2022-05-13 ENCOUNTER — Encounter (HOSPITAL_COMMUNITY)
Admission: RE | Admit: 2022-05-13 | Discharge: 2022-05-13 | Disposition: A | Discharge status: Home / Self Care | Payer: Medicare HMO | Source: Ambulatory Visit | Attending: Pulmonary Disease | Admitting: Pulmonary Disease

## 2022-05-13 VITALS — Wt 185.0 lb

## 2022-05-13 DIAGNOSIS — R0602 Shortness of breath: Secondary | ICD-10-CM

## 2022-05-13 NOTE — Progress Notes (Signed)
Daily Session Note  Patient Details  Name: Nancy Mccormick MRN: [id number] Date of Birth: [birth year] Referring Provider:   April Manson Pulmonary Rehab Walk Test from 04/21/2022 in [place]  Referring Provider Darcella Cheshire, MD       Encounter Date: 05/13/2022  Check In:  Session Check In - 05/13/22 1526       Check-In   Supervising physician immediately available to respond to emergencies [facility] immediately available    Physician(s) Dr. [place]    [facility]    Staff Present Rodney Langton, Cathleen Fears, MS, ACSM-CEP, Exercise Physiologist;Randi Yevonne Pax, ACSM-CEP, Exercise Physiologist    Virtual Visit No    Medication changes reported     No    Fall or balance concerns reported    No    Tobacco Cessation No Change    Warm-up and Cool-down Performed as group-led instruction    Resistance Training Performed Yes    VAD Patient? No    PAD/SET Patient? No      Pain Assessment   Currently in Pain? No/denies    Multiple Pain Sites No             Capillary Blood Glucose: No results found for this or any previous visit (from the past 24 hour(s)).   Exercise Prescription Changes - 05/13/22 1600       Response to Exercise   Blood Pressure (Admit) 104/60    Blood Pressure (Exercise) 116/64    Blood Pressure (Exit) 108/64    Heart Rate (Admit) 73 bpm    Heart Rate (Exercise) 97 bpm    Heart Rate (Exit) 77 bpm    Oxygen Saturation (Admit) 94 %    Oxygen Saturation (Exercise) 95 %    Oxygen Saturation (Exit) 96 %    Rating of Perceived Exertion (Exercise) 12    Perceived Dyspnea (Exercise) 1    Duration Continue with 30 min of aerobic exercise without signs/symptoms of physical distress.    Intensity THRR unchanged      Resistance Training   Training Prescription Yes    Weight red bands    Reps 10-15    Time 10 Minutes      NuStep   Level 3    SPM 80    Minutes 15    METs 2.1       Track   Laps 19    Minutes 15    METs 3.2             Social History   Tobacco Use  Smoking Status Former   Types: Cigarettes   Quit date: 12/17/1986   Years since quitting: 35.4  Smokeless Tobacco Never  Tobacco Comments   Smoke briefly in the distant past    Goals Met:  Proper associated with RPD/PD & O2 Sat Exercise tolerated well No report of concerns or symptoms today Strength training completed today  Goals Unmet:  Not Applicable  Comments: Service time is from 1321 to 1450.    Dr. Rodman Pickle is Medical Director for Pulmonary Rehab at [facility].

## 2022-05-14 NOTE — Progress Notes (Signed)
Pulmonary Individual Treatment Plan  Patient Details  Name: Nancy Mccormick MRN: [id number] Date of Birth: 1934/05/29 Referring Provider:   April Manson Pulmonary Rehab Walk Test from 04/21/2022 in [facility]  Referring Provider Chi Elzie Rings, MD       Initial Encounter Date:  Flowsheet Row Pulmonary Rehab Walk Test from 04/21/2022 in [facility]  Date 04/21/22       Visit Diagnosis: Shortness of breath  Patient's Home Medications on Admission:   Current Outpatient Medications:    acetaminophen (TYLENOL) 500 MG tablet, Take 1,000 mg by mouth every 6 (six) hours as needed for mild pain or moderate pain., Disp: , Rfl:    apixaban (ELIQUIS) 5 MG TABS tablet, TAKE 1 TABLET(5 MG) BY MOUTH TWICE DAILY, Disp: 180 tablet, Rfl: 1   Calcium Carbonate-Vitamin D (CALCIUM + D PO), Take 1 tablet by mouth at bedtime., Disp: , Rfl:    Cholecalciferol 50 MCG (2000 UT) TABS, Take by mouth., Disp: , Rfl:    diltiazem (CARDIZEM CD) 180 MG 24 hr capsule, Take 1 capsule (180 mg total) by mouth daily., Disp: 90 capsule, Rfl: 3   fluticasone (FLONASE) 50 MCG/ACT nasal spray, Place 2 sprays into both nostrils daily., Disp: 16 g, Rfl: 6   lubiprostone (AMITIZA) 8 MCG capsule, Take 1 capsule (8 mcg total) by mouth 2 (two) times daily with a meal., Disp: 60 capsule, Rfl: 0   Multiple Vitamin (MULTIVITAMIN WITH MINERALS) TABS tablet, Take 1 tablet by mouth at bedtime., Disp: , Rfl:    nitroGLYCERIN (NITROSTAT) 0.4 MG SL tablet, Place 1 tablet (0.4 mg total) under the tongue as needed., Disp: 25 tablet, Rfl: 11   polyethylene glycol (MIRALAX / GLYCOLAX) 17 g packet, Take 17 g by mouth daily., Disp: , Rfl:   Past Medical History: Past Medical History:  Diagnosis Date   Arthritis    back, left shoulder, and fingers   Atrial fibrillation ([place])    History of chicken pox    Hyperlipidemia    Hypertension    Osteoporosis    Stroke ([place])      Tobacco Use: Social History   Tobacco Use  Smoking Status Former   Types: Cigarettes   Quit date: 12/17/1986   Years since quitting: 35.4  Smokeless Tobacco Never  Tobacco Comments   Smoke briefly in the distant past    Labs: Review Flowsheet  More data exists      Latest Ref Rng & Units 08/18/2018 03/28/2019 07/11/2019 01/09/2020 10/31/2020  Labs for ITP Cardiac and Pulmonary Rehab  Cholestrol 0 - 200 mg/dL 222  203  197  178  184   LDL (calc) 0 - 99 mg/dL 101  93  97  79  78   HDL-C >39.00 mg/dL 88  78  69.70  70.80  69.20   Trlycerides 0.0 - 149.0 mg/dL 163  158  148.0  142.0  181.0     Capillary Blood Glucose: No results found for: "GLUCAP"   Pulmonary Assessment Scores:  Pulmonary Assessment Scores     Row Name 04/21/22 0958         ADL UCSD   ADL Phase Entry     SOB Score total 78       CAT Score   CAT Score pre 22       mMRC Score   mMRC Score 4             UCSD: Self-administered rating of  dyspnea associated with activities of daily living (ADLs) 6-point scale (0 = "not at all" to 5 = "maximal or unable to do because of breathlessness")  Scoring Scores range from 0 to 120.  Minimally important difference is 5 units  CAT: CAT can identify the health impairment of COPD patients and is better correlated with disease progression.  CAT has a scoring range of zero to 40. The CAT score is classified into four groups of low (less than 10), medium (10 - 20), high (21-30) and very high (31-40) based on the impact level of disease on health status. A CAT score over 10 suggests significant symptoms.  A worsening CAT score could be explained by an exacerbation, poor medication adherence, poor inhaler technique, or progression of COPD or comorbid conditions.  CAT MCID is 2 points  mMRC: mMRC (Modified Medical Research Council) Dyspnea Scale is used to assess the degree of baseline functional disability in patients of respiratory disease due to dyspnea. No  minimal important difference is established. A decrease in score of 1 point or greater is considered a positive change.   Pulmonary Function Assessment:  Pulmonary Function Assessment - 04/21/22 0957       Breath   Bilateral Breath Sounds Clear    Shortness of Breath Yes;Limiting activity             Exercise Target Goals: Exercise Program Goal: Individual exercise prescription set using results from initial 6 min walk test and THRR while considering  patient's activity barriers and safety.   Exercise Prescription Goal: Initial exercise prescription builds to 30-45 minutes a day of aerobic activity, 2-3 days per week.  Home exercise guidelines will be given to patient during program as part of exercise prescription that the participant will acknowledge.  Activity Barriers & Risk Stratification:  Activity Barriers & Cardiac Risk Stratification - 04/21/22 0947       Activity Barriers & Cardiac Risk Stratification   Activity Barriers History of Falls;Shortness of Breath;Joint Problems;Arthritis;Other (comment);Balance Concerns    Comments generalized bilat knee issues    Cardiac Risk Stratification Moderate             6 Minute Walk:  6 Minute Walk     Row Name 04/21/22 0852         6 Minute Walk   Phase Initial     Distance 636 feet     Walk Time 6 minutes  Pt took 2 breaks: 3:20-4:14 and 5:20 to 6:00 due to elevated HR     # of Rest Breaks 2  Total of 2 breaks:  93 seconds     MPH 1.58     METS 1.64     RPE 13     Perceived Dyspnea  2     VO2 Peak 5.75     Symptoms Yes (comment)     Comments SOB, RPD = 2. PT exceeding THR     Resting HR 74 bpm     Resting BP 138/60     Resting Oxygen Saturation  95 %     Exercise Oxygen Saturation  during 6 min walk 96 %     Max Ex. HR 150 bpm     Max Ex. BP 152/80     2 Minute Post BP 144/80       Interval HR   1 Minute HR 102     2 Minute HR 125     3 Minute HR 142     4 Minute HR  92     5 Minute HR 150     6  Minute HR 91     2 Minute Post HR 71     Interval Heart Rate? Yes       Interval Oxygen   Interval Oxygen? Yes     Baseline Oxygen Saturation % 95 %     1 Minute Oxygen Saturation % 93 %     1 Minute Liters of Oxygen 0 L     2 Minute Oxygen Saturation % 93 %     2 Minute Liters of Oxygen 0 L     3 Minute Oxygen Saturation % 92 %     3 Minute Liters of Oxygen 0 L     4 Minute Oxygen Saturation % 95 %     4 Minute Liters of Oxygen 0 L     5 Minute Oxygen Saturation % 93 %     5 Minute Liters of Oxygen 0 L     6 Minute Oxygen Saturation % 96 %     6 Minute Liters of Oxygen 0 L     2 Minute Post Oxygen Saturation % 97 %     2 Minute Post Liters of Oxygen 0 L              Oxygen Initial Assessment:  Oxygen Initial Assessment - 04/22/22 0914       Intervention   Long  Term Goals Exhibits compliance with exercise, home  and travel O2 prescription;Verbalizes importance of monitoring SPO2 with pulse oximeter and return demonstration;Maintenance of O2 saturations>88%;Exhibits proper breathing techniques, such as pursed lip breathing or other method taught during program session             Oxygen Re-Evaluation:  Oxygen Re-Evaluation     Row Name 04/22/22 0914 05/09/22 0856           Program Oxygen Prescription   Program Oxygen Prescription None None        Home Oxygen   Home Oxygen Device None None      Sleep Oxygen Prescription None None      Home Exercise Oxygen Prescription None None      Home Resting Oxygen Prescription None None        Goals/Expected Outcomes   Short Term Goals To learn and understand importance of monitoring SPO2 with pulse oximeter and demonstrate accurate use of the pulse oximeter.;To learn and understand importance of maintaining oxygen saturations>88%;To learn and demonstrate proper pursed lip breathing techniques or other breathing techniques.  To learn and understand importance of monitoring SPO2 with pulse oximeter and demonstrate  accurate use of the pulse oximeter.;To learn and understand importance of maintaining oxygen saturations>88%;To learn and demonstrate proper pursed lip breathing techniques or other breathing techniques.       Long  Term Goals -- Exhibits compliance with exercise, home  and travel O2 prescription;Verbalizes importance of monitoring SPO2 with pulse oximeter and return demonstration;Maintenance of O2 saturations>88%;Exhibits proper breathing techniques, such as pursed lip breathing or other method taught during program session      Goals/Expected Outcomes Compliance and understanding of oxygen saturation monitoring and breathing techniques to decrease shortness of breath. Compliance and understanding of oxygen saturation monitoring and breathing techniques to decrease shortness of breath.               Oxygen Discharge (Final Oxygen Re-Evaluation):  Oxygen Re-Evaluation - 05/09/22 0856       Program Oxygen Prescription   Program  Oxygen Prescription None      Home Oxygen   Home Oxygen Device None    Sleep Oxygen Prescription None    Home Exercise Oxygen Prescription None    Home Resting Oxygen Prescription None      Goals/Expected Outcomes   Short Term Goals To learn and understand importance of monitoring SPO2 with pulse oximeter and demonstrate accurate use of the pulse oximeter.;To learn and understand importance of maintaining oxygen saturations>88%;To learn and demonstrate proper pursed lip breathing techniques or other breathing techniques.     Long  Term Goals Exhibits compliance with exercise, home  and travel O2 prescription;Verbalizes importance of monitoring SPO2 with pulse oximeter and return demonstration;Maintenance of O2 saturations>88%;Exhibits proper breathing techniques, such as pursed lip breathing or other method taught during program session    Goals/Expected Outcomes Compliance and understanding of oxygen saturation monitoring and breathing techniques to decrease  shortness of breath.             Initial Exercise Prescription:  Initial Exercise Prescription - 04/21/22 1500       Date of Initial Exercise RX and Referring Provider   Date 04/21/22    Referring Provider Chi Elzie Rings, MD    Expected Discharge Date 06/26/22      NuStep   Level 1    SPM 75    Minutes 30    METs 1.6      Prescription Details   Frequency (times per week) 2    Duration Progress to 30 minutes of continuous aerobic without signs/symptoms of physical distress      Intensity   THRR 40-80% of Max Heartrate 54-108    Ratings of Perceived Exertion 11-13    Perceived Dyspnea 0-4      Progression   Progression Continue progressive overload as per policy without signs/symptoms or physical distress.      Resistance Training   Training Prescription Yes    Weight Yellow bands    Reps 10-15             Perform Capillary Blood Glucose checks as needed.  Exercise Prescription Changes:   Exercise Prescription Changes     Row Name 04/29/22 1500 05/13/22 1600           Response to Exercise   Blood Pressure (Admit) 100/62 104/60      Blood Pressure (Exercise) 126/70 116/64      Blood Pressure (Exit) 118/60 108/64      Heart Rate (Admit) 67 bpm 73 bpm      Heart Rate (Exercise) 96 bpm 97 bpm      Heart Rate (Exit) 83 bpm 77 bpm      Oxygen Saturation (Admit) 95 % 94 %      Oxygen Saturation (Exercise) 92 % 95 %      Oxygen Saturation (Exit) 94 % 96 %      Rating of Perceived Exertion (Exercise) 11 12      Perceived Dyspnea (Exercise) 0 1      Duration Progress to 30 minutes of  aerobic without signs/symptoms of physical distress Continue with 30 min of aerobic exercise without signs/symptoms of physical distress.      Intensity THRR unchanged THRR unchanged        Resistance Training   Training Prescription Yes Yes      Weight red bands red bands      Reps 10-15 10-15      Time 10 Minutes 10 Minutes  NuStep   Level 1 3      SPM 70 80       Minutes 30 15      METs 1.6 2.1        Track   Laps -- 19      Minutes -- 15      METs -- 3.2               Exercise Comments:   Exercise Comments     Row Name 04/29/22 1518           Exercise Comments Pt completed first day of exercise. She exercised on the Nustep for 30 min. [place] averaged 1.6 METs at level 1. She tolerated the Nustep well. She performed the warmup and cooldown standing without limitations. Will consider progressing her to another exercise mode.                Exercise Goals and Review:   Exercise Goals     Row Name 04/21/22 1550 04/22/22 0913 05/09/22 0847         Exercise Goals   Increase Physical Activity Yes Yes Yes     Intervention Provide advice, education, support and counseling about physical activity/exercise needs.;Develop an individualized exercise prescription for aerobic and resistive training based on initial evaluation findings, risk stratification, comorbidities and participant's personal goals. Provide advice, education, support and counseling about physical activity/exercise needs.;Develop an individualized exercise prescription for aerobic and resistive training based on initial evaluation findings, risk stratification, comorbidities and participant's personal goals. Provide advice, education, support and counseling about physical activity/exercise needs.;Develop an individualized exercise prescription for aerobic and resistive training based on initial evaluation findings, risk stratification, comorbidities and participant's personal goals.     Expected Outcomes Short Term: Attend rehab on a regular basis to increase amount of physical activity.;Long Term: Add in home exercise to make exercise part of routine and to increase amount of physical activity.;Long Term: Exercising regularly at least 3-5 days a week. Short Term: Attend rehab on a regular basis to increase amount of physical activity.;Long Term: Add in home exercise to  make exercise part of routine and to increase amount of physical activity.;Long Term: Exercising regularly at least 3-5 days a week. Short Term: Attend rehab on a regular basis to increase amount of physical activity.;Long Term: Add in home exercise to make exercise part of routine and to increase amount of physical activity.;Long Term: Exercising regularly at least 3-5 days a week.     Increase Strength and Stamina Yes Yes Yes     Intervention Provide advice, education, support and counseling about physical activity/exercise needs.;Develop an individualized exercise prescription for aerobic and resistive training based on initial evaluation findings, risk stratification, comorbidities and participant's personal goals. Provide advice, education, support and counseling about physical activity/exercise needs.;Develop an individualized exercise prescription for aerobic and resistive training based on initial evaluation findings, risk stratification, comorbidities and participant's personal goals. Provide advice, education, support and counseling about physical activity/exercise needs.;Develop an individualized exercise prescription for aerobic and resistive training based on initial evaluation findings, risk stratification, comorbidities and participant's personal goals.     Expected Outcomes Long Term: Improve cardiorespiratory fitness, muscular endurance and strength as measured by increased METs and functional capacity (6MWT);Short Term: Perform resistance training exercises routinely during rehab and add in resistance training at home;Short Term: Increase workloads from initial exercise prescription for resistance, speed, and METs. Long Term: Improve cardiorespiratory fitness, muscular endurance and strength as measured by increased METs and functional  capacity (6MWT);Short Term: Perform resistance training exercises routinely during rehab and add in resistance training at home;Short Term: Increase workloads  from initial exercise prescription for resistance, speed, and METs. Long Term: Improve cardiorespiratory fitness, muscular endurance and strength as measured by increased METs and functional capacity (6MWT);Short Term: Perform resistance training exercises routinely during rehab and add in resistance training at home;Short Term: Increase workloads from initial exercise prescription for resistance, speed, and METs.     Able to understand and use rate of perceived exertion (RPE) scale Yes Yes Yes     Intervention Provide education and explanation on how to use RPE scale Provide education and explanation on how to use RPE scale Provide education and explanation on how to use RPE scale     Expected Outcomes Short Term: Able to use RPE daily in rehab to express subjective intensity level;Long Term:  Able to use RPE to guide intensity level when exercising independently Short Term: Able to use RPE daily in rehab to express subjective intensity level;Long Term:  Able to use RPE to guide intensity level when exercising independently Short Term: Able to use RPE daily in rehab to express subjective intensity level;Long Term:  Able to use RPE to guide intensity level when exercising independently     Able to understand and use Dyspnea scale Yes Yes Yes     Intervention Provide education and explanation on how to use Dyspnea scale Provide education and explanation on how to use Dyspnea scale Provide education and explanation on how to use Dyspnea scale     Expected Outcomes Short Term: Able to use Dyspnea scale daily in rehab to express subjective sense of shortness of breath during exertion;Long Term: Able to use Dyspnea scale to guide intensity level when exercising independently Short Term: Able to use Dyspnea scale daily in rehab to express subjective sense of shortness of breath during exertion;Long Term: Able to use Dyspnea scale to guide intensity level when exercising independently Short Term: Able to use Dyspnea  scale daily in rehab to express subjective sense of shortness of breath during exertion;Long Term: Able to use Dyspnea scale to guide intensity level when exercising independently     Knowledge and understanding of Target Heart Rate Range (THRR) Yes Yes Yes     Intervention Provide education and explanation of THRR including how the numbers were predicted and where they are located for reference Provide education and explanation of THRR including how the numbers were predicted and where they are located for reference Provide education and explanation of THRR including how the numbers were predicted and where they are located for reference     Expected Outcomes Short Term: Able to state/look up THRR;Short Term: Able to use daily as guideline for intensity in rehab;Long Term: Able to use THRR to govern intensity when exercising independently Short Term: Able to state/look up THRR;Short Term: Able to use daily as guideline for intensity in rehab;Long Term: Able to use THRR to govern intensity when exercising independently Short Term: Able to state/look up THRR;Short Term: Able to use daily as guideline for intensity in rehab;Long Term: Able to use THRR to govern intensity when exercising independently     Understanding of Exercise Prescription Yes Yes Yes     Intervention Provide education, explanation, and written materials on patient's individual exercise prescription Provide education, explanation, and written materials on patient's individual exercise prescription Provide education, explanation, and written materials on patient's individual exercise prescription     Expected Outcomes Short Term: Able to  explain program exercise prescription;Long Term: Able to explain home exercise prescription to exercise independently Short Term: Able to explain program exercise prescription;Long Term: Able to explain home exercise prescription to exercise independently Short Term: Able to explain program exercise  prescription;Long Term: Able to explain home exercise prescription to exercise independently              Exercise Goals Re-Evaluation :  Exercise Goals Re-Evaluation     Row Name 04/22/22 0913 05/09/22 0848           Exercise Goal Re-Evaluation   Exercise Goals Review Increase Physical Activity;Increase Strength and Stamina;Able to understand and use rate of perceived exertion (RPE) scale;Able to understand and use Dyspnea scale;Knowledge and understanding of Target Heart Rate Range (THRR);Understanding of Exercise Prescription Increase Physical Activity;Increase Strength and Stamina;Able to understand and use rate of perceived exertion (RPE) scale;Able to understand and use Dyspnea scale;Knowledge and understanding of Target Heart Rate Range (THRR);Understanding of Exercise Prescription      Comments Pt is scheduled to begin exercise next week. Will continue to monitor and progress as tolerated. Pt has completed 4 exercise sessions. She has progressed to performing nustep for 15 min at level 3, 2.4 METs. She also began walking track for 18 laps, METs 3.09, in 15 min the last session. She is progressing well. She performs warmup and cooldown independently standing.  Will continue to monitor and progress as tolerated. Will discuss home exercise plan with pt soon.      Expected Outcomes Through exercise at rehab and home, the patient will decrease shortness of breath with daily activities and feel confident in carrying out an exercise regimen at home. Through exercise at rehab and home, the patient will decrease shortness of breath with daily activities and feel confident in carrying out an exercise regimen at home.               Discharge Exercise Prescription (Final Exercise Prescription Changes):  Exercise Prescription Changes - 05/13/22 1600       Response to Exercise   Blood Pressure (Admit) 104/60    Blood Pressure (Exercise) 116/64    Blood Pressure (Exit) 108/64    Heart  Rate (Admit) 73 bpm    Heart Rate (Exercise) 97 bpm    Heart Rate (Exit) 77 bpm    Oxygen Saturation (Admit) 94 %    Oxygen Saturation (Exercise) 95 %    Oxygen Saturation (Exit) 96 %    Rating of Perceived Exertion (Exercise) 12    Perceived Dyspnea (Exercise) 1    Duration Continue with 30 min of aerobic exercise without signs/symptoms of physical distress.    Intensity THRR unchanged      Resistance Training   Training Prescription Yes    Weight red bands    Reps 10-15    Time 10 Minutes      NuStep   Level 3    SPM 80    Minutes 15    METs 2.1      Track   Laps 19    Minutes 15    METs 3.2             Nutrition:  Target Goals: Understanding of nutrition guidelines, daily intake of sodium '1500mg'$ , cholesterol '200mg'$ , calories 30% from fat and 7% or less from saturated fats, daily to have 5 or more servings of fruits and vegetables.  Biometrics:  Pre Biometrics - 04/21/22 1019       Pre Biometrics   Grip Strength  20 kg              Nutrition Therapy Plan and Nutrition Goals:  Nutrition Therapy & Goals - 05/06/22 1552       Nutrition Therapy   Diet Heart Healthy Diet      Personal Nutrition Goals   Nutrition Goal Patient to choose a variety of fruits, vegetables, whole grains, lean protein/plant protein, nonfat/lowfat dairy as part of heart healthy diet.    Personal Goal #2 Patient to limit to '1500mg'$  of sodium daily.    Personal Goal #3 Patient to limit and identify foods sources of saturated fat, trans fat, sodium, and refined carbohydrates    Comments Patient reports ongoing fatigue. Patient reports ongoing constipation/diarhea and she is currently doing a trial of Linzess.She admits eating out frequently. Most recent labs show low/normal B12, triglycerides 181.      Intervention Plan   Intervention Prescribe, educate and counsel regarding individualized specific dietary modifications aiming towards targeted core components such as weight,  hypertension, lipid management, diabetes, heart failure and other comorbidities.    Expected Outcomes Short Term Goal: Understand basic principles of dietary content, such as calories, fat, sodium, cholesterol and nutrients.;Long Term Goal: Adherence to prescribed nutrition plan.             Nutrition Assessments:  Nutrition Assessments - 05/06/22 1559       Rate Your Plate Scores   Pre Score 62            MEDIFICTS Score Key: ?70 Need to make dietary changes  40-70 Heart Healthy Diet ? 40 Therapeutic Level Cholesterol Diet  Flowsheet Row PULMONARY REHAB OTHER RESPIRATORY from 05/06/2022 in [facility]  Picture Your Plate Total Score on Admission 62      Picture Your Plate Scores: <30 Unhealthy dietary pattern with much room for improvement. 41-50 Dietary pattern unlikely to meet recommendations for good health and room for improvement. 51-60 More healthful dietary pattern, with some room for improvement.  >60 Healthy dietary pattern, although there may be some specific behaviors that could be improved.    Nutrition Goals Re-Evaluation:  Nutrition Goals Re-Evaluation     [place] Name 05/06/22 1552             Goals   Current Weight 188 lb 0.8 oz (85.3 kg)       Comment Patient reports ongoing fatigue. Patient reports ongoing constipation/diarhea and she is currently doing a trial of Linzess.She admits eating out frequently. Most recent labs show low/normal B12, triglycerides 181. She takes daily PO MVI and B12.       Expected Outcome Discussed the importance of increasing to four servings of fruit and five servings of vegetables daily and strategies for reducing added sugar. Expect improvement to GI concerns with dietary changes.                Nutrition Goals Discharge (Final Nutrition Goals Re-Evaluation):  Nutrition Goals Re-Evaluation - 05/06/22 1552       Goals   Current Weight 188 lb 0.8 oz (85.3 kg)    Comment Patient  reports ongoing fatigue. Patient reports ongoing constipation/diarhea and she is currently doing a trial of Linzess.She admits eating out frequently. Most recent labs show low/normal B12, triglycerides 181. She takes daily PO MVI and B12.    Expected Outcome Discussed the importance of increasing to four servings of fruit and five servings of vegetables daily and strategies for reducing added sugar. Expect improvement to GI  concerns with dietary changes.             Psychosocial: Target Goals: Acknowledge presence or absence of significant depression and/or stress, maximize coping skills, provide positive support system. Participant is able to verbalize types and ability to use techniques and skills needed for reducing stress and depression.  Initial Review & Psychosocial Screening:  Initial Psych Review & Screening - 04/21/22 0937       Initial Review   Current issues with None Identified      Family Dynamics   Good Support System? Yes    Comments Lives in assistive living and feels supported by friends, faith community and family      Barriers   Psychosocial barriers to participate in program There are no identifiable barriers or psychosocial needs.      Screening Interventions   Interventions Encouraged to exercise             Quality of Life Scores:  Scores of 19 and below usually indicate a poorer quality of life in these areas.  A difference of  2-3 points is a clinically meaningful difference.  A difference of 2-3 points in the total score of the Quality of Life Index has been associated with significant improvement in overall quality of life, self-image, physical symptoms, and general health in studies assessing change in quality of life.  PHQ-9: Review Flowsheet  More data exists      05/08/2022 04/21/2022 12/23/2021 05/07/2021 12/17/2020  Depression screen PHQ 2/9  Decreased Interest 0 0 0 0 0  Down, Depressed, Hopeless 0 0 0 0 0  PHQ - 2 Score 0 0 0 0 0  Altered  sleeping - 1 - - -  Tired, decreased energy - 0 - - -  Change in appetite - 1 - - -  Feeling bad or failure about yourself  - 0 - - -  Trouble concentrating - 0 - - -  Moving slowly or fidgety/restless - 0 - - -  Suicidal thoughts - 0 - - -  PHQ-9 Score - 2 - - -  Difficult doing work/chores - Somewhat difficult - - -   Interpretation of Total Score  Total Score Depression Severity:  1-4 = Minimal depression, 5-9 = Mild depression, 10-14 = Moderate depression, 15-19 = Moderately severe depression, 20-27 = Severe depression   Psychosocial Evaluation and Intervention:   Psychosocial Re-Evaluation:  Psychosocial Re-Evaluation     Row Name 04/22/22 0928 05/08/22 0856           Psychosocial Re-Evaluation   Current issues with None Identified None Identified      Comments No change in psychosocial assessment since orientation. She begins exercising in 1 week. No psychosocial barriers or concerns identified at this time.      Expected Outcomes For Merie to continue to be without barriers or psychosocial concerns while participating in pulmonary rehab For Maryclare to continue to be without psychosocial concerns while participating in pulmonary rehab.      Interventions Encouraged to attend Pulmonary Rehabilitation for the exercise Encouraged to attend Pulmonary Rehabilitation for the exercise      Continue Psychosocial Services  No Follow up required No Follow up required               Psychosocial Discharge (Final Psychosocial Re-Evaluation):  Psychosocial Re-Evaluation - 05/08/22 0856       Psychosocial Re-Evaluation   Current issues with None Identified    Comments No psychosocial barriers or concerns identified at  this time.    Expected Outcomes For Yun to continue to be without psychosocial concerns while participating in pulmonary rehab.    Interventions Encouraged to attend Pulmonary Rehabilitation for the exercise    Continue Psychosocial Services  No Follow up  required             Education: Education Goals: Education classes will be provided on a weekly basis, covering required topics. Participant will state understanding/return demonstration of topics presented.  Learning Barriers/Preferences:  Learning Barriers/Preferences - 04/21/22 0946       Learning Barriers/Preferences   Learning Barriers Sight;Hearing    Learning Preferences Individual Instruction;Group Instruction;Written Material;Verbal Instruction             Education Topics: Risk Factor Reduction:  -Group instruction that is supported by a PowerPoint presentation. Instructor discusses the definition of a risk factor, different risk factors for pulmonary disease, and how the heart and lungs work together.     Nutrition for Pulmonary Patient:  -Group instruction provided by PowerPoint slides, verbal discussion, and written materials to support subject matter. The instructor gives an explanation and review of healthy diet recommendations, which includes a discussion on weight management, recommendations for fruit and vegetable consumption, as well as protein, fluid, caffeine, fiber, sodium, sugar, and alcohol. Tips for eating when patients are short of breath are discussed.   Pursed Lip Breathing:  -Group instruction that is supported by demonstration and informational handouts. Instructor discusses the benefits of pursed lip and diaphragmatic breathing and detailed demonstration on how to preform both.     Oxygen Safety:  -Group instruction provided by PowerPoint, verbal discussion, and written material to support subject matter. There is an overview of "What is Oxygen" and "Why do we need it".  Instructor also reviews how to create a safe environment for oxygen use, the importance of using oxygen as prescribed, and the risks of noncompliance. There is a brief discussion on traveling with oxygen and resources the patient may utilize.   Oxygen Equipment:  -Group  instruction provided by [employer] Staff utilizing handouts, written materials, and equipment demonstrations.   Signs and Symptoms:  -Group instruction provided by written material and verbal discussion to support subject matter. Warning signs and symptoms of infection, stroke, and heart attack are reviewed and when to call the physician/911 reinforced. Tips for preventing the spread of infection discussed.   Advanced Directives:  -Group instruction provided by verbal instruction and written material to support subject matter. Instructor reviews Advanced Directive laws and proper instruction for filling out document.   Pulmonary Video:  -Group video education that reviews the importance of medication and oxygen compliance, exercise, good nutrition, pulmonary hygiene, and pursed lip and diaphragmatic breathing for the pulmonary patient.   Exercise for the Pulmonary Patient:  -Group instruction that is supported by a PowerPoint presentation. Instructor discusses benefits of exercise, core components of exercise, frequency, duration, and intensity of an exercise routine, importance of utilizing pulse oximetry during exercise, safety while exercising, and options of places to exercise outside of rehab.   Flowsheet Row PULMONARY REHAB OTHER RESPIRATORY from 05/08/2022 in [place]  Date 05/08/22  Educator Donnetta Simpers  [Handout]  Instruction Review Code 1- Verbalizes Understanding       Pulmonary Medications:  -Verbally interactive group education provided by instructor with focus on inhaled medications and proper administration.   Anatomy and Physiology of the Respiratory System and Intimacy:  -Group instruction provided by PowerPoint, verbal discussion, and written material to  support subject matter. Instructor reviews respiratory cycle and anatomical components of the respiratory system and their functions. Instructor also reviews differences in obstructive and  restrictive respiratory diseases with examples of each. Intimacy, Sex, and Sexuality differences are reviewed with a discussion on how relationships can change when diagnosed with pulmonary disease. Common sexual concerns are reviewed.   MD DAY -A group question and answer session with a medical doctor that allows participants to ask questions that relate to their pulmonary disease state.   OTHER EDUCATION -Group or individual verbal, written, or video instructions that support the educational goals of the pulmonary rehab program. [place] from 05/08/2022 in [place]  Date 05/01/22  Gwyndolyn Kaufman Your Numbers]  Educator Donnetta Simpers  Instruction Review Code 2- Demonstrated Understanding       Holiday Eating Survival Tips:  -Group instruction provided by PowerPoint slides, verbal discussion, and written materials to support subject matter. The instructor gives patients tips, tricks, and techniques to help them not only survive but enjoy the holidays despite the onslaught of food that accompanies the holidays.   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             Core Components/Risk Factors/Patient Goals Review:   Goals and Risk Factor Review     Row Name 04/22/22 0930 05/08/22 0857           Core Components/Risk Factors/Patient Goals Review   Personal Goals Review Weight Management/Obesity;Develop more efficient breathing techniques such as purse lipped  breathing and diaphragmatic breathing and practicing self-pacing with activity.;Increase knowledge of respiratory medications and ability to use respiratory devices properly.;Improve shortness of breath with ADL's Weight Management/Obesity;Improve shortness of breath with ADL's;Develop more efficient breathing techniques such as purse lipped breathing and diaphragmatic breathing and practicing self-pacing with activity.;Increase knowledge of respiratory medications and ability to use respiratory devices properly.      Review Adelma will begin exercising in 1 week, not able to work on program goals at this time. Coriann has just began exercising in pulmonary rehab and has attended 3 exercise sessions.  She is tolerating the exercise well, presently exercising at 2.3 mets on the nustep for 30 minutes, will continue to increase workloads as tolerated. Too early for weight loss.      Expected Outcomes See admission goals. See admission goals.               Core Components/Risk Factors/Patient Goals at Discharge (Final Review):   Goals and Risk Factor Review - 05/08/22 0857       Core Components/Risk Factors/Patient Goals Review   Personal Goals Review Weight Management/Obesity;Improve shortness of breath with ADL's;Develop more efficient breathing techniques such as purse lipped breathing and diaphragmatic breathing and practicing self-pacing with activity.;Increase knowledge of respiratory medications and ability to use respiratory devices properly.    Review Ceasia has just began exercising in pulmonary rehab and has attended 3 exercise sessions.  She is tolerating the exercise well, presently exercising  at 2.3 mets on the nustep for 30 minutes, will continue to increase workloads as tolerated. Too early for weight loss.    Expected Outcomes See admission goals.             ITP Comments:  ITP Comments     Row Name 04/21/22 1104           ITP Comments Pulmonary Rehab Medical Director  - Dr. Loanne Drilling               Pt is making expected progress toward Pulmonary Rehab goals after completing 5 sessions. Recommend continued exercise, life style modification, education, and utilization of breathing techniques to increase stamina and strength, while also decreasing shortness of breath with exertion.  Dr. Rodman Pickle is Medical Director for Pulmonary Rehab at [facility].

## 2022-05-15 ENCOUNTER — Encounter (HOSPITAL_COMMUNITY)
Admission: RE | Admit: 2022-05-15 | Discharge: 2022-05-15 | Disposition: A | Discharge status: Home / Self Care | Payer: Medicare HMO | Source: Ambulatory Visit | Attending: Pulmonary Disease | Admitting: Pulmonary Disease

## 2022-05-15 DIAGNOSIS — R0602 Shortness of breath: Secondary | ICD-10-CM

## 2022-05-15 NOTE — Progress Notes (Signed)
Home Exercise Prescription I have reviewed a Home Exercise Prescription with [name]. Nancy Mccormick is walking 1 non-rehab day/wk for 30 min/day. I encouraged her to increase her frequency to 2 non-rehab days/wk for 30 min/day. I discussed walking around the grocery store. She agreed with my recommendations. [name] mentioned walking around her living facility. I told her that I would be fine with her walking around her facility. [name] seems motivated to exercise and improve her functional capacity. The patient stated that their goals were to lose weight and increase her energy levels. We reviewed exercise guidelines, target heart rate during exercise, RPE Scale, weather conditions, endpoints for exercise, warmup and cool down. The patient is encouraged to come to me with any questions. I will continue to follow up with the patient to assist them with progression and safety.    Sheppard Plumber, MS, ACSM-CEP 05/15/2022 3:30 PM

## 2022-05-15 NOTE — Progress Notes (Signed)
Daily Session Note  Patient Details  Name: Nancy Mccormick MRN: [id number] Date of Birth: [birth year] Referring Provider:   April Manson Pulmonary Rehab Walk Test from 04/21/2022 in [place]  Referring Provider Darcella Cheshire, MD       Encounter Date: 05/15/2022  Check In:  Session Check In - 05/15/22 1432       Check-In   Supervising physician immediately available to respond to emergencies [facility] immediately available    Physician(s) Dr. Posey Pronto    Location [facility]    Staff Present Rosebud Poles, RN, BSN;Randi Olen Cordial BS, ACSM-CEP, Exercise Physiologist;Kaylee Rosana Hoes, MS, ACSM-CEP, Exercise Physiologist;Lisa Ysidro Evert, RN    Virtual Visit No    Medication changes reported     No    Fall or balance concerns reported    No    Tobacco Cessation No Change    Warm-up and Cool-down Performed as group-led instruction    Resistance Training Performed Yes    VAD Patient? No    PAD/SET Patient? No      Pain Assessment   Currently in Pain? No/denies    Multiple Pain Sites No             Capillary Blood Glucose: No results found for this or any previous visit (from the past 24 hour(s)).   Exercise Prescription Changes - 05/15/22 1500       Home Exercise Plan   Plans to continue exercise at Home (comment)    Frequency Add 1 additional day to program exercise sessions.    Initial Home Exercises Provided 05/15/22             Social History   Tobacco Use  Smoking Status Former   Types: Cigarettes   Quit date: 12/17/1986   Years since quitting: 35.4  Smokeless Tobacco Never  Tobacco Comments   Smoke briefly in the distant past    Goals Met:  Proper associated with RPD/PD & O2 Sat Exercise tolerated well No report of concerns or symptoms today Strength training completed today  Goals Unmet:  Not Applicable  Comments: Service time is from 1323 to 1445.    Dr. Rodman Pickle is Medical Director for  Pulmonary Rehab at [facility]

## 2022-05-20 ENCOUNTER — Telehealth (HOSPITAL_COMMUNITY): Payer: Self-pay | Admitting: Family Medicine

## 2022-05-20 ENCOUNTER — Encounter (HOSPITAL_COMMUNITY): Payer: Medicare HMO

## 2022-05-22 ENCOUNTER — Encounter (HOSPITAL_COMMUNITY)
Admission: RE | Admit: 2022-05-22 | Discharge: 2022-05-22 | Disposition: A | Discharge status: Home / Self Care | Payer: Medicare HMO | Source: Ambulatory Visit | Attending: Pulmonary Disease | Admitting: Pulmonary Disease

## 2022-05-22 DIAGNOSIS — R0602 Shortness of breath: Secondary | ICD-10-CM | POA: Diagnosis not present

## 2022-05-22 NOTE — Progress Notes (Signed)
Daily Session Note  Patient Details  Name: Nancy Mccormick MRN: [id number] Date of Birth: Jun 02, 1934 Referring Provider:   April Manson Pulmonary Rehab Walk Test from 04/21/2022 in [place]  Referring Provider Darcella Cheshire, MD       Encounter Date: 05/22/2022  Check In:  Session Check In - 05/22/22 1426       Check-In   Supervising physician immediately available to respond to emergencies [facility] immediately available    Physician(s) Dr. Lonny Prude    Location [facility]    Staff Present Rosebud Poles, RN, BSN;Carlette Wilber Oliphant, RN, Quentin Ore, MS, ACSM-CEP, Exercise Physiologist;Lisa Ysidro Evert, RN    Virtual Visit No    Medication changes reported     No    Fall or balance concerns reported    No    Tobacco Cessation No Change    Warm-up and Cool-down Performed as group-led instruction    Resistance Training Performed Yes    VAD Patient? No    PAD/SET Patient? No      Pain Assessment   Currently in Pain? No/denies    Multiple Pain Sites No             Capillary Blood Glucose: No results found for this or any previous visit (from the past 24 hour(s)).    Social History   Tobacco Use  Smoking Status Former   Types: Cigarettes   Quit date: 12/17/1986   Years since quitting: 35.4  Smokeless Tobacco Never  Tobacco Comments   Smoke briefly in the distant past    Goals Met:  Proper associated with RPD/PD & O2 Sat Exercise tolerated well No report of concerns or symptoms today Strength training completed today  Goals Unmet:  Not Applicable  Comments: Service time is from 1315 to 1440.    Dr. Rodman Pickle is Medical Director for Pulmonary Rehab at [facility]

## 2022-05-27 ENCOUNTER — Encounter (HOSPITAL_COMMUNITY)
Admission: RE | Admit: 2022-05-27 | Discharge: 2022-05-27 | Disposition: A | Discharge status: Home / Self Care | Payer: Medicare HMO | Source: Ambulatory Visit | Attending: Pulmonary Disease | Admitting: Pulmonary Disease

## 2022-05-27 VITALS — Wt 185.0 lb

## 2022-05-27 DIAGNOSIS — R0602 Shortness of breath: Secondary | ICD-10-CM | POA: Diagnosis not present

## 2022-05-27 NOTE — Progress Notes (Unsigned)
Cardiology Office Note   Date:  05/29/2022   ID:  Deeanne Mccormick, DOB 1933-12-06, MRN [id number]  PCP:  Vivi Barrack, MD  Cardiologist:   Minus Breeding, MD   Chief Complaint  Patient presents with   Dizziness      History of Present Illness: Nancy Mccormick is a 88 y.o. female who presents for followup of atrial fibrillation she was admitted in Nov 2018 with a small MCA infarct and was in atrial fib.  She was in the hospital in August of 2019.  She had atrial fib with RVR which converted spontaneously to NSR.    She was in the emergency room in March with shortness of breath.    There was no clear cardiac etiology.  He did have a urinary infection I see.  She presents with continued SOB and profound fatigue.  She has had an extensive work up to include echo with only mild AI.  There was a normal BNP.  She had a negative perfusion study and has had lab work with no anemia and a normal TSH.  In the ED CXR was unremarkable.  There was no PE on CT.  She had a head CT and had no acute findings.   She said she feels much better since I last saw her.  Shortness of breath is improved.  Dizziness is gone.  Fatigue is better.  She thinks all of this was related to [place].  She is doing pulmonary rehab.   Past Medical History:  Diagnosis Date   Arthritis    back, left shoulder, and fingers   Atrial fibrillation ([place])    History of chicken pox    Hyperlipidemia    Hypertension    Osteoporosis    Stroke [facility])     Past Surgical History:  Procedure Laterality Date   CATARACT EXTRACTION     KNEE ARTHROSCOPY Right 07/04/2015   Procedure: RIGHT KNEE ARTHROSCOPY WITH MEDIAL MENISCAL DEBRIDEMENT AND CHONDROPLASTY;  Surgeon: Gaynelle Arabian, MD;  Location: [facility];  Service: Orthopedics;  Laterality: Right;   OPEN REDUCTION INTERNAL FIXATION (ORIF) DISTAL RADIAL FRACTURE Right 12/28/2012   Procedure: OPEN REDUCTION INTERNAL FIXATION (ORIF) DISTAL RADIAL FRACTURE;  Surgeon: Hessie Dibble,  MD;  Location: [facility];  Service: Orthopedics;  Laterality: Right;   VAGINAL HYSTERECTOMY       Current Outpatient Medications  Medication Sig Dispense Refill   acetaminophen (TYLENOL) 500 MG tablet Take 1,000 mg by mouth every 6 (six) hours as needed for mild pain or moderate pain.     apixaban (ELIQUIS) 5 MG TABS tablet TAKE 1 TABLET(5 MG) BY MOUTH TWICE DAILY 180 tablet 1   Calcium Carbonate-Vitamin D (CALCIUM + D PO) Take 1 tablet by mouth at bedtime.     Cholecalciferol 50 MCG (2000 UT) TABS Take by mouth.     diltiazem (CARDIZEM CD) 180 MG 24 hr capsule Take 1 capsule (180 mg total) by mouth daily. 90 capsule 3   fluticasone (FLONASE) 50 MCG/ACT nasal spray Place 2 sprays into both nostrils daily. 16 g 6   lubiprostone (AMITIZA) 8 MCG capsule Take 1 capsule (8 mcg total) by mouth 2 (two) times daily with a meal. 60 capsule 0   Multiple Vitamin (MULTIVITAMIN WITH MINERALS) TABS tablet Take 1 tablet by mouth at bedtime.     nitroGLYCERIN (NITROSTAT) 0.4 MG SL tablet Place 1 tablet (0.4 mg total) under the tongue as needed. 25 tablet 11   polyethylene glycol (MIRALAX /  GLYCOLAX) 17 g packet Take 17 g by mouth daily.     No current facility-administered medications for this visit.    Allergies:   Shellfish allergy    ROS:  Please see the history of present illness.   Otherwise, review of systems are positive for none.   All other systems are reviewed and negative.    PHYSICAL EXAM: VS:  BP 134/80 (BP Location: Left Arm, Patient Position: Sitting, Cuff Size: Normal)   Pulse 70   Ht '5\' 4"'$  (1.626 m)   Wt 183 lb (83 kg)   BMI 31.41 kg/m  , BMI Body mass index is 31.41 kg/m. GENERAL:  Well appearing NECK:  No jugular venous distention, waveform within normal limits, carotid upstroke brisk and symmetric, no bruits, no thyromegaly LUNGS:  Clear to auscultation bilaterally CHEST:  Unremarkable HEART:  PMI not displaced or sustained,S1 and S2 within normal limits, no  S3, no S4, no clicks, no rubs, no murmurs ABD:  Flat, positive bowel sounds normal in frequency in pitch, no bruits, no rebound, no guarding, no midline pulsatile mass, no hepatomegaly, no splenomegaly EXT:  2 plus pulses throughout, no edema, no cyanosis no clubbing  EKG:  EKG is not ordered today.   Recent Labs: 12/02/2021: TSH 3.23 12/26/2021: NT-Pro BNP 184 01/01/2022: B Natriuretic Peptide 40.6 01/08/2022: ALT 22; Hemoglobin 13.1; Platelets 313.0 02/05/2022: BUN 15; Creatinine, Ser 0.70; Potassium 3.8; Sodium 143    Lipid Panel    Component Value Date/Time   CHOL 184 10/31/2020 0752   CHOL 203 (H) 03/28/2019 0936   CHOL 129 03/31/2013 0826   TRIG 181.0 (H) 10/31/2020 0752   TRIG 147 04/22/2016 1232   TRIG 87 03/31/2013 0826   HDL 69.20 10/31/2020 0752   HDL 78 03/28/2019 0936   HDL 69 04/22/2016 1232   HDL 56 03/31/2013 0826   CHOLHDL 3 10/31/2020 0752   VLDL 36.2 10/31/2020 0752   LDLCALC 78 10/31/2020 0752   LDLCALC 93 03/28/2019 0936   LDLCALC 135 (H) 06/15/2014 1247   LDLCALC 56 03/31/2013 0826      Wt Readings from Last 3 Encounters:  05/29/22 183 lb (83 kg)  05/27/22 184 lb 15.5 oz (83.9 kg)  05/13/22 184 lb 15.5 oz (83.9 kg)      Other studies Reviewed: Additional studies/ records that were reviewed today include: Labs Review of the above records demonstrates:  Please see elsewhere in the note.     ASSESSMENT AND PLAN:   ATRIAL FIBRILLATION:      [name] has a CHA2DS2 - VASc score of 6.    She has had no paroxysms of this.  No change.  She tolerates anticoagulation.  HTN:   The blood pressure is at target.  No change in therapy.     DYSLIPIDEMIA:    LDL is 78 and 2022.  She can have this followed by her primary provider.   FATIGUE: This is much improved.  No change in therapy.   Current medicines are reviewed at length with the patient today.  The patient does not have concerns regarding medicines.  The following changes have been made:  None  Labs/ tests ordered today include:   None  No orders of the defined types were placed in this encounter.    Disposition:   FU with me in 1 year   Signed, Minus Breeding, MD  05/29/2022 8:14 AM    [place]

## 2022-05-27 NOTE — Progress Notes (Signed)
Daily Session Note  Patient Details  Name: Nancy Mccormick MRN: [id number] Date of Birth: [birth year] Referring Provider:   April Manson Pul[facility] Rehab Walk Test from 04/21/2022 in [place]  Referring Provider Darcella Cheshire, MD       Encounter Date: 05/27/2022  Check In:  Session Check In - 05/27/22 1515       Check-In   Supervising physician immediately available to respond to emergencies [facility] immediately available    Physician(s) Dr. Lonny Prude    Location [facility]    Staff Present Rosebud Poles, RN, BSN;Lisa Ysidro Evert, Cathleen Fears, MS, ACSM-CEP, Exercise Physiologist;Randi Yevonne Pax, ACSM-CEP, Exercise Physiologist    Virtual Visit No    Medication changes reported     No    Fall or balance concerns reported    No    Tobacco Cessation No Change    Warm-up and Cool-down Performed as group-led instruction    Resistance Training Performed Yes    VAD Patient? No    PAD/SET Patient? No      Pain Assessment   Currently in Pain? No/denies    Multiple Pain Sites No             Capillary Blood Glucose: No results found for this or any previous visit (from the past 24 hour(s)).   Exercise Prescription Changes - 05/27/22 1500       Response to Exercise   Blood Pressure (Admit) 116/64    Blood Pressure (Exercise) 128/70    Blood Pressure (Exit) 110/66    Heart Rate (Admit) 68 bpm    Heart Rate (Exercise) 102 bpm    Heart Rate (Exit) 72 bpm    Oxygen Saturation (Admit) 93 %    Oxygen Saturation (Exercise) 92 %    Oxygen Saturation (Exit) 96 %    Rating of Perceived Exertion (Exercise) 13    Perceived Dyspnea (Exercise) 1    Duration Continue with 30 min of aerobic exercise without signs/symptoms of physical distress.    Intensity THRR unchanged      Resistance Training   Training Prescription Yes    Weight red bands    Reps 10-15    Time 10 Minutes      NuStep   Level 4    SPM 80    Minutes 15     METs 2.3      Track   Laps 20    Minutes 15    METs 3.32             Social History   Tobacco Use  Smoking Status Former   Types: Cigarettes   Quit date: 12/17/1986   Years since quitting: 35.4  Smokeless Tobacco Never  Tobacco Comments   Smoke briefly in the distant past    Goals Met:  Proper associated with RPD/PD & O2 Sat Exercise tolerated well No report of concerns or symptoms today Strength training completed today  Goals Unmet:  Not Applicable 8811 Comments: Service time is from 1318 to 1440    Dr. Rodman Pickle is Medical Director for Pulmonary Rehab at [facility].

## 2022-05-29 ENCOUNTER — Encounter (HOSPITAL_COMMUNITY)
Admission: RE | Admit: 2022-05-29 | Discharge: 2022-05-29 | Disposition: A | Discharge status: Home / Self Care | Payer: Medicare HMO | Source: Ambulatory Visit | Attending: Pulmonary Disease | Admitting: Pulmonary Disease

## 2022-05-29 ENCOUNTER — Encounter: Payer: Self-pay | Admitting: Cardiology

## 2022-05-29 ENCOUNTER — Ambulatory Visit: Payer: Medicare HMO | Admitting: Cardiology

## 2022-05-29 VITALS — BP 134/80 | HR 70 | Ht 64.0 in | Wt 183.0 lb

## 2022-05-29 DIAGNOSIS — R0602 Shortness of breath: Secondary | ICD-10-CM | POA: Diagnosis not present

## 2022-05-29 DIAGNOSIS — I480 Paroxysmal atrial fibrillation: Secondary | ICD-10-CM

## 2022-05-29 DIAGNOSIS — E785 Hyperlipidemia, unspecified: Secondary | ICD-10-CM

## 2022-05-29 DIAGNOSIS — I10 Essential (primary) hypertension: Secondary | ICD-10-CM | POA: Diagnosis not present

## 2022-05-29 NOTE — Progress Notes (Signed)
Daily Session Note  Patient Details  Name: Nancy Mccormick MRN: [id number] Date of Birth: January 27, 1934 Referring Provider:   April Manson Pulmonary Rehab Walk Test from 04/21/2022 in [place]  Referring Provider Darcella Cheshire, MD       Encounter Date: 05/29/2022  Check In:  Session Check In - 05/29/22 1504       Check-In   Supervising physician immediately available to respond to emergencies [facility] immediately available    Physician(s) Dr. Florene Glen    Location [facility]    Staff Present Rosebud Poles, RN, Quentin Ore, MS, ACSM-CEP, Exercise Physiologist;Carlette Wilber Oliphant, RN, BSN;Lisa Ysidro Evert, RN;Randi [facility], ACSM-CEP, Exercise Physiologist    Virtual Visit No    Medication changes reported     No    Fall or balance concerns reported    No    Tobacco Cessation No Change    Warm-up and Cool-down Performed as group-led instruction    Resistance Training Performed Yes    VAD Patient? No    PAD/SET Patient? No      Pain Assessment   Currently in Pain? No/denies    Multiple Pain Sites No             Capillary Blood Glucose: No results found for this or any previous visit (from the past 24 hour(s)).    Social History   Tobacco Use  Smoking Status Former   Types: Cigarettes   Quit date: 12/17/1986   Years since quitting: 35.4  Smokeless Tobacco Never  Tobacco Comments   Smoke briefly in the distant past    Goals Met:  Proper associated with RPD/PD & O2 Sat Exercise tolerated well No report of concerns or symptoms today Strength training completed today  Goals Unmet:  Not Applicable  Comments: Service time is from 1323 to Leonard    Dr. Rodman Pickle is Medical Director for Pulmonary Rehab at [facility].

## 2022-05-29 NOTE — Patient Instructions (Signed)
Medication Instructions:  Your physician recommends that you continue on your current medications as directed. Please refer to the Current Medication list given to you today.  *If you need a refill on your cardiac medications before your next appointment, please call your pharmacy*  Lab Work: NONE ordered at this time of appointment   If you have labs (blood work) drawn today and your tests are completely normal, you will receive your results only by: [place] (if you have MyChart) OR A paper copy in the mail If you have any lab test that is abnormal or we need to change your treatment, we will call you to review the results.  Testing/Procedures: NONE ordered at this time of appointment   Follow-Up: At [facility], you and your health needs are our priority.  As part of our continuing mission to provide you with exceptional heart care, we have created designated Provider Care Teams.  These Care Teams include your primary Cardiologist (physician) and Advanced Practice Providers (APPs -  Physician Assistants and Nurse Practitioners) who all work together to provide you with the care you need, when you need it.  Your next appointment:   1 year(s)  The format for your next appointment:   In Person  Provider:   Minus Breeding, MD    Other Instructions   Important Information About Sugar

## 2022-06-03 ENCOUNTER — Encounter (HOSPITAL_COMMUNITY): Payer: Medicare HMO

## 2022-06-05 ENCOUNTER — Encounter (HOSPITAL_COMMUNITY): Payer: Medicare HMO

## 2022-06-10 ENCOUNTER — Encounter (HOSPITAL_COMMUNITY)
Admission: RE | Admit: 2022-06-10 | Discharge: 2022-06-10 | Disposition: A | Discharge status: Home / Self Care | Payer: Medicare HMO | Source: Ambulatory Visit | Attending: Pulmonary Disease | Admitting: Pulmonary Disease

## 2022-06-10 ENCOUNTER — Telehealth (HOSPITAL_COMMUNITY): Payer: Self-pay | Admitting: Family Medicine

## 2022-06-10 DIAGNOSIS — R0602 Shortness of breath: Secondary | ICD-10-CM | POA: Insufficient documentation

## 2022-06-11 NOTE — Progress Notes (Signed)
Pulmonary Individual Treatment Plan  Patient Details  Name: Demitria Hay MRN: [id number] Date of Birth: [birth year] Referring Provider:   April Manson Pulmonary Rehab Walk Test from 04/21/2022 in [place]  Referring Provider Chi Elzie Rings, MD       Initial Encounter Date:  Flowsheet Row Pulmonary Rehab Walk Test from 04/21/2022 in [facility]  Date 04/21/22       Visit Diagnosis: Shortness of breath  Patient's Home Medications on Admission:   Current Outpatient Medications:    acetaminophen (TYLENOL) 500 MG tablet, Take 1,000 mg by mouth every 6 (six) hours as needed for mild pain or moderate pain., Disp: , Rfl:    apixaban (ELIQUIS) 5 MG TABS tablet, TAKE 1 TABLET(5 MG) BY MOUTH TWICE DAILY, Disp: 180 tablet, Rfl: 1   Calcium Carbonate-Vitamin D (CALCIUM + D PO), Take 1 tablet by mouth at bedtime., Disp: , Rfl:    Cholecalciferol 50 MCG (2000 UT) TABS, Take by mouth., Disp: , Rfl:    diltiazem (CARDIZEM CD) 180 MG 24 hr capsule, Take 1 capsule (180 mg total) by mouth daily., Disp: 90 capsule, Rfl: 3   fluticasone (FLONASE) 50 MCG/ACT nasal spray, Place 2 sprays into both nostrils daily., Disp: 16 g, Rfl: 6   lubiprostone (AMITIZA) 8 MCG capsule, Take 1 capsule (8 mcg total) by mouth 2 (two) times daily with a meal., Disp: 60 capsule, Rfl: 0   Multiple Vitamin (MULTIVITAMIN WITH MINERALS) TABS tablet, Take 1 tablet by mouth at bedtime., Disp: , Rfl:    nitroGLYCERIN (NITROSTAT) 0.4 MG SL tablet, Place 1 tablet (0.4 mg total) under the tongue as needed., Disp: 25 tablet, Rfl: 11   polyethylene glycol (MIRALAX / GLYCOLAX) 17 g packet, Take 17 g by mouth daily., Disp: , Rfl:   Past Medical History: Past Medical History:  Diagnosis Date   Arthritis    back, left shoulder, and fingers   Atrial fibrillation ([place])    History of chicken pox    Hyperlipidemia    Hypertension    Osteoporosis    Stroke ([place])      Tobacco Use: Social History   Tobacco Use  Smoking Status Former   Types: Cigarettes   Quit date: 12/17/1986   Years since quitting: 35.5  Smokeless Tobacco Never  Tobacco Comments   Smoke briefly in the distant past    Labs: Review Flowsheet  More data exists      Latest Ref Rng & Units 08/18/2018 03/28/2019 07/11/2019 01/09/2020 10/31/2020  Labs for ITP Cardiac and Pulmonary Rehab  Cholestrol 0 - 200 mg/dL 222  203  197  178  184   LDL (calc) 0 - 99 mg/dL 101  93  97  79  78   HDL-C >39.00 mg/dL 88  78  69.70  70.80  69.20   Trlycerides 0.0 - 149.0 mg/dL 163  158  148.0  142.0  181.0     Capillary Blood Glucose: No results found for: "GLUCAP"   Pulmonary Assessment Scores:  Pulmonary Assessment Scores     Row Name 04/21/22 0958         ADL UCSD   ADL Phase Entry     SOB Score total 78       CAT Score   CAT Score pre 22       mMRC Score   mMRC Score 4             UCSD: Self-administered rating of  dyspnea associated with activities of daily living (ADLs) 6-point scale (0 = "not at all" to 5 = "maximal or unable to do because of breathlessness")  Scoring Scores range from 0 to 120.  Minimally important difference is 5 units  CAT: CAT can identify the health impairment of COPD patients and is better correlated with disease progression.  CAT has a scoring range of zero to 40. The CAT score is classified into four groups of low (less than 10), medium (10 - 20), high (21-30) and very high (31-40) based on the impact level of disease on health status. A CAT score over 10 suggests significant symptoms.  A worsening CAT score could be explained by an exacerbation, poor medication adherence, poor inhaler technique, or progression of COPD or comorbid conditions.  CAT MCID is 2 points  mMRC: mMRC (Modified Medical Research Council) Dyspnea Scale is used to assess the degree of baseline functional disability in patients of respiratory disease due to dyspnea. No  minimal important difference is established. A decrease in score of 1 point or greater is considered a positive change.   Pulmonary Function Assessment:  Pulmonary Function Assessment - 04/21/22 0957       Breath   Bilateral Breath Sounds Clear    Shortness of Breath Yes;Limiting activity             Exercise Target Goals: Exercise Program Goal: Individual exercise prescription set using results from initial 6 min walk test and THRR while considering  patient's activity barriers and safety.   Exercise Prescription Goal: Initial exercise prescription builds to 30-45 minutes a day of aerobic activity, 2-3 days per week.  Home exercise guidelines will be given to patient during program as part of exercise prescription that the participant will acknowledge.  Activity Barriers & Risk Stratification:  Activity Barriers & Cardiac Risk Stratification - 04/21/22 0947       Activity Barriers & Cardiac Risk Stratification   Activity Barriers History of Falls;Shortness of Breath;Joint Problems;Arthritis;Other (comment);Balance Concerns    Comments generalized bilat knee issues    Cardiac Risk Stratification Moderate             6 Minute Walk:  6 Minute Walk     Row Name 04/21/22 0852         6 Minute Walk   Phase Initial     Distance 636 feet     Walk Time 6 minutes  Pt took 2 breaks: 3:20-4:14 and 5:20 to 6:00 due to elevated HR     # of Rest Breaks 2  Total of 2 breaks:  93 seconds     MPH 1.58     METS 1.64     RPE 13     Perceived Dyspnea  2     VO2 Peak 5.75     Symptoms Yes (comment)     Comments SOB, RPD = 2. PT exceeding THR     Resting HR 74 bpm     Resting BP 138/60     Resting Oxygen Saturation  95 %     Exercise Oxygen Saturation  during 6 min walk 96 %     Max Ex. HR 150 bpm     Max Ex. BP 152/80     2 Minute Post BP 144/80       Interval HR   1 Minute HR 102     2 Minute HR 125     3 Minute HR 142     4 Minute HR  92     5 Minute HR 150     6  Minute HR 91     2 Minute Post HR 71     Interval Heart Rate? Yes       Interval Oxygen   Interval Oxygen? Yes     Baseline Oxygen Saturation % 95 %     1 Minute Oxygen Saturation % 93 %     1 Minute Liters of Oxygen 0 L     2 Minute Oxygen Saturation % 93 %     2 Minute Liters of Oxygen 0 L     3 Minute Oxygen Saturation % 92 %     3 Minute Liters of Oxygen 0 L     4 Minute Oxygen Saturation % 95 %     4 Minute Liters of Oxygen 0 L     5 Minute Oxygen Saturation % 93 %     5 Minute Liters of Oxygen 0 L     6 Minute Oxygen Saturation % 96 %     6 Minute Liters of Oxygen 0 L     2 Minute Post Oxygen Saturation % 97 %     2 Minute Post Liters of Oxygen 0 L              Oxygen Initial Assessment:  Oxygen Initial Assessment - 04/22/22 0914       Intervention   Long  Term Goals Exhibits compliance with exercise, home  and travel O2 prescription;Verbalizes importance of monitoring SPO2 with pulse oximeter and return demonstration;Maintenance of O2 saturations>88%;Exhibits proper breathing techniques, such as pursed lip breathing or other method taught during program session             Oxygen Re-Evaluation:  Oxygen Re-Evaluation     Row Name 04/22/22 0914 05/09/22 0856 06/04/22 0916         Program Oxygen Prescription   Program Oxygen Prescription None None None       Home Oxygen   Home Oxygen Device None None None     Sleep Oxygen Prescription None None None     Home Exercise Oxygen Prescription None None None     Home Resting Oxygen Prescription None None None       Goals/Expected Outcomes   Short Term Goals To learn and understand importance of monitoring SPO2 with pulse oximeter and demonstrate accurate use of the pulse oximeter.;To learn and understand importance of maintaining oxygen saturations>88%;To learn and demonstrate proper pursed lip breathing techniques or other breathing techniques.  To learn and understand importance of monitoring SPO2 with pulse  oximeter and demonstrate accurate use of the pulse oximeter.;To learn and understand importance of maintaining oxygen saturations>88%;To learn and demonstrate proper pursed lip breathing techniques or other breathing techniques.  To learn and understand importance of monitoring SPO2 with pulse oximeter and demonstrate accurate use of the pulse oximeter.;To learn and understand importance of maintaining oxygen saturations>88%;To learn and demonstrate proper pursed lip breathing techniques or other breathing techniques.      Long  Term Goals -- Exhibits compliance with exercise, home  and travel O2 prescription;Verbalizes importance of monitoring SPO2 with pulse oximeter and return demonstration;Maintenance of O2 saturations>88%;Exhibits proper breathing techniques, such as pursed lip breathing or other method taught during program session Exhibits compliance with exercise, home  and travel O2 prescription;Verbalizes importance of monitoring SPO2 with pulse oximeter and return demonstration;Maintenance of O2 saturations>88%;Exhibits proper breathing techniques, such as pursed lip breathing or other method taught  during program session     Comments -- -- Pt continues to not require O2 supplementation.     Goals/Expected Outcomes Compliance and understanding of oxygen saturation monitoring and breathing techniques to decrease shortness of breath. Compliance and understanding of oxygen saturation monitoring and breathing techniques to decrease shortness of breath. Compliance and understanding of oxygen saturation monitoring and breathing techniques to decrease shortness of breath.              Oxygen Discharge (Final Oxygen Re-Evaluation):  Oxygen Re-Evaluation - 06/04/22 0916       Program Oxygen Prescription   Program Oxygen Prescription None      Home Oxygen   Home Oxygen Device None    Sleep Oxygen Prescription None    Home Exercise Oxygen Prescription None    Home Resting Oxygen Prescription  None      Goals/Expected Outcomes   Short Term Goals To learn and understand importance of monitoring SPO2 with pulse oximeter and demonstrate accurate use of the pulse oximeter.;To learn and understand importance of maintaining oxygen saturations>88%;To learn and demonstrate proper pursed lip breathing techniques or other breathing techniques.     Long  Term Goals Exhibits compliance with exercise, home  and travel O2 prescription;Verbalizes importance of monitoring SPO2 with pulse oximeter and return demonstration;Maintenance of O2 saturations>88%;Exhibits proper breathing techniques, such as pursed lip breathing or other method taught during program session    Comments Pt continues to not require O2 supplementation.    Goals/Expected Outcomes Compliance and understanding of oxygen saturation monitoring and breathing techniques to decrease shortness of breath.             Initial Exercise Prescription:  Initial Exercise Prescription - 04/21/22 1500       Date of Initial Exercise RX and Referring Provider   Date 04/21/22    Referring Provider Chi Elzie Rings, MD    Expected Discharge Date 06/26/22      NuStep   Level 1    SPM 75    Minutes 30    METs 1.6      Prescription Details   Frequency (times per week) 2    Duration Progress to 30 minutes of continuous aerobic without signs/symptoms of physical distress      Intensity   THRR 40-80% of Max Heartrate 54-108    Ratings of Perceived Exertion 11-13    Perceived Dyspnea 0-4      Progression   Progression Continue progressive overload as per policy without signs/symptoms or physical distress.      Resistance Training   Training Prescription Yes    Weight Yellow bands    Reps 10-15             Perform Capillary Blood Glucose checks as needed.  Exercise Prescription Changes:   Exercise Prescription Changes     Row Name 04/29/22 1500 05/13/22 1600 05/15/22 1500 05/27/22 1500       Response to Exercise   Blood  Pressure (Admit) 100/62 104/60 -- 116/64    Blood Pressure (Exercise) 126/70 116/64 -- 128/70    Blood Pressure (Exit) 118/60 108/64 -- 110/66    Heart Rate (Admit) 67 bpm 73 bpm -- 68 bpm    Heart Rate (Exercise) 96 bpm 97 bpm -- 102 bpm    Heart Rate (Exit) 83 bpm 77 bpm -- 72 bpm    Oxygen Saturation (Admit) 95 % 94 % -- 93 %    Oxygen Saturation (Exercise) 92 % 95 % -- 92 %  Oxygen Saturation (Exit) 94 % 96 % -- 96 %    Rating of Perceived Exertion (Exercise) 11 12 -- 13    Perceived Dyspnea (Exercise) 0 1 -- 1    Duration Progress to 30 minutes of  aerobic without signs/symptoms of physical distress Continue with 30 min of aerobic exercise without signs/symptoms of physical distress. -- Continue with 30 min of aerobic exercise without signs/symptoms of physical distress.    Intensity THRR unchanged THRR unchanged -- THRR unchanged      Resistance Training   Training Prescription Yes Yes -- Yes    Weight red bands red bands -- red bands    Reps 10-15 10-15 -- 10-15    Time 10 Minutes 10 Minutes -- 10 Minutes      NuStep   Level 1 3 -- 4    SPM 70 80 -- 80    Minutes 30 15 -- 15    METs 1.6 2.1 -- 2.3      Track   Laps -- 19 -- 20    Minutes -- 15 -- 15    METs -- 3.2 -- 3.32      Home Exercise Plan   Plans to continue exercise at -- -- Home (comment) --    Frequency -- -- Add 1 additional day to program exercise sessions. --    Initial Home Exercises Provided -- -- 05/15/22 --             Exercise Comments:   Exercise Comments     Row Name 04/29/22 1518 05/15/22 1518         Exercise Comments Pt completed first day of exercise. She exercised on the Nustep for 30 min. Jodean averaged 1.6 METs at level 1. She tolerated the Nustep well. She performed the warmup and cooldown standing without limitations. Will consider progressing her to another exercise mode. Completed home exercise plan. Maegan is walking 1 non-rehab day/wk for 30 min/day. I encouraged her to  increase her frequency to 2 non-rehab days/wk for 30 min/day. I discussed walking around the grocery store. She agreed with my recommendations. Saveah mentioned walking around her living facility. I told her that I would be fine with her walking around her facility. Erian seems motivated to exercise and improve her functional capacity.               Exercise Goals and Review:   Exercise Goals     Row Name 04/21/22 1550 04/22/22 0913 05/09/22 0847 06/04/22 0908       Exercise Goals   Increase Physical Activity Yes Yes Yes Yes    Intervention Provide advice, education, support and counseling about physical activity/exercise needs.;Develop an individualized exercise prescription for aerobic and resistive training based on initial evaluation findings, risk stratification, comorbidities and participant's personal goals. Provide advice, education, support and counseling about physical activity/exercise needs.;Develop an individualized exercise prescription for aerobic and resistive training based on initial evaluation findings, risk stratification, comorbidities and participant's personal goals. Provide advice, education, support and counseling about physical activity/exercise needs.;Develop an individualized exercise prescription for aerobic and resistive training based on initial evaluation findings, risk stratification, comorbidities and participant's personal goals. Provide advice, education, support and counseling about physical activity/exercise needs.;Develop an individualized exercise prescription for aerobic and resistive training based on initial evaluation findings, risk stratification, comorbidities and participant's personal goals.    Expected Outcomes Short Term: Attend rehab on a regular basis to increase amount of physical activity.;Long Term: Add in home exercise to make  exercise part of routine and to increase amount of physical activity.;Long Term: Exercising regularly at least 3-5  days a week. Short Term: Attend rehab on a regular basis to increase amount of physical activity.;Long Term: Add in home exercise to make exercise part of routine and to increase amount of physical activity.;Long Term: Exercising regularly at least 3-5 days a week. Short Term: Attend rehab on a regular basis to increase amount of physical activity.;Long Term: Add in home exercise to make exercise part of routine and to increase amount of physical activity.;Long Term: Exercising regularly at least 3-5 days a week. Short Term: Attend rehab on a regular basis to increase amount of physical activity.;Long Term: Add in home exercise to make exercise part of routine and to increase amount of physical activity.;Long Term: Exercising regularly at least 3-5 days a week.    Increase Strength and Stamina Yes Yes Yes Yes    Intervention Provide advice, education, support and counseling about physical activity/exercise needs.;Develop an individualized exercise prescription for aerobic and resistive training based on initial evaluation findings, risk stratification, comorbidities and participant's personal goals. Provide advice, education, support and counseling about physical activity/exercise needs.;Develop an individualized exercise prescription for aerobic and resistive training based on initial evaluation findings, risk stratification, comorbidities and participant's personal goals. Provide advice, education, support and counseling about physical activity/exercise needs.;Develop an individualized exercise prescription for aerobic and resistive training based on initial evaluation findings, risk stratification, comorbidities and participant's personal goals. Provide advice, education, support and counseling about physical activity/exercise needs.;Develop an individualized exercise prescription for aerobic and resistive training based on initial evaluation findings, risk stratification, comorbidities and participant's  personal goals.    Expected Outcomes Long Term: Improve cardiorespiratory fitness, muscular endurance and strength as measured by increased METs and functional capacity (6MWT);Short Term: Perform resistance training exercises routinely during rehab and add in resistance training at home;Short Term: Increase workloads from initial exercise prescription for resistance, speed, and METs. Long Term: Improve cardiorespiratory fitness, muscular endurance and strength as measured by increased METs and functional capacity (6MWT);Short Term: Perform resistance training exercises routinely during rehab and add in resistance training at home;Short Term: Increase workloads from initial exercise prescription for resistance, speed, and METs. Long Term: Improve cardiorespiratory fitness, muscular endurance and strength as measured by increased METs and functional capacity (6MWT);Short Term: Perform resistance training exercises routinely during rehab and add in resistance training at home;Short Term: Increase workloads from initial exercise prescription for resistance, speed, and METs. Long Term: Improve cardiorespiratory fitness, muscular endurance and strength as measured by increased METs and functional capacity (6MWT);Short Term: Perform resistance training exercises routinely during rehab and add in resistance training at home;Short Term: Increase workloads from initial exercise prescription for resistance, speed, and METs.    Able to understand and use rate of perceived exertion (RPE) scale Yes Yes Yes Yes    Intervention Provide education and explanation on how to use RPE scale Provide education and explanation on how to use RPE scale Provide education and explanation on how to use RPE scale Provide education and explanation on how to use RPE scale    Expected Outcomes Short Term: Able to use RPE daily in rehab to express subjective intensity level;Long Term:  Able to use RPE to guide intensity level when exercising  independently Short Term: Able to use RPE daily in rehab to express subjective intensity level;Long Term:  Able to use RPE to guide intensity level when exercising independently Short Term: Able to use RPE daily in rehab  to express subjective intensity level;Long Term:  Able to use RPE to guide intensity level when exercising independently Short Term: Able to use RPE daily in rehab to express subjective intensity level;Long Term:  Able to use RPE to guide intensity level when exercising independently    Able to understand and use Dyspnea scale Yes Yes Yes Yes    Intervention Provide education and explanation on how to use Dyspnea scale Provide education and explanation on how to use Dyspnea scale Provide education and explanation on how to use Dyspnea scale Provide education and explanation on how to use Dyspnea scale    Expected Outcomes Short Term: Able to use Dyspnea scale daily in rehab to express subjective sense of shortness of breath during exertion;Long Term: Able to use Dyspnea scale to guide intensity level when exercising independently Short Term: Able to use Dyspnea scale daily in rehab to express subjective sense of shortness of breath during exertion;Long Term: Able to use Dyspnea scale to guide intensity level when exercising independently Short Term: Able to use Dyspnea scale daily in rehab to express subjective sense of shortness of breath during exertion;Long Term: Able to use Dyspnea scale to guide intensity level when exercising independently Short Term: Able to use Dyspnea scale daily in rehab to express subjective sense of shortness of breath during exertion;Long Term: Able to use Dyspnea scale to guide intensity level when exercising independently    Knowledge and understanding of Target Heart Rate Range (THRR) Yes Yes Yes Yes    Intervention Provide education and explanation of THRR including how the numbers were predicted and where they are located for reference Provide education and  explanation of THRR including how the numbers were predicted and where they are located for reference Provide education and explanation of THRR including how the numbers were predicted and where they are located for reference Provide education and explanation of THRR including how the numbers were predicted and where they are located for reference    Expected Outcomes Short Term: Able to state/look up THRR;Short Term: Able to use daily as guideline for intensity in rehab;Long Term: Able to use THRR to govern intensity when exercising independently Short Term: Able to state/look up THRR;Short Term: Able to use daily as guideline for intensity in rehab;Long Term: Able to use THRR to govern intensity when exercising independently Short Term: Able to state/look up THRR;Short Term: Able to use daily as guideline for intensity in rehab;Long Term: Able to use THRR to govern intensity when exercising independently Short Term: Able to state/look up THRR;Short Term: Able to use daily as guideline for intensity in rehab;Long Term: Able to use THRR to govern intensity when exercising independently    Understanding of Exercise Prescription Yes Yes Yes Yes    Intervention Provide education, explanation, and written materials on patient's individual exercise prescription Provide education, explanation, and written materials on patient's individual exercise prescription Provide education, explanation, and written materials on patient's individual exercise prescription Provide education, explanation, and written materials on patient's individual exercise prescription    Expected Outcomes Short Term: Able to explain program exercise prescription;Long Term: Able to explain home exercise prescription to exercise independently Short Term: Able to explain program exercise prescription;Long Term: Able to explain home exercise prescription to exercise independently Short Term: Able to explain program exercise prescription;Long Term:  Able to explain home exercise prescription to exercise independently Short Term: Able to explain program exercise prescription;Long Term: Able to explain home exercise prescription to exercise independently  Exercise Goals Re-Evaluation :  Exercise Goals Re-Evaluation     Row Name 04/22/22 0913 05/09/22 0848 06/04/22 0909         Exercise Goal Re-Evaluation   Exercise Goals Review Increase Physical Activity;Increase Strength and Stamina;Able to understand and use rate of perceived exertion (RPE) scale;Able to understand and use Dyspnea scale;Knowledge and understanding of Target Heart Rate Range (THRR);Understanding of Exercise Prescription Increase Physical Activity;Increase Strength and Stamina;Able to understand and use rate of perceived exertion (RPE) scale;Able to understand and use Dyspnea scale;Knowledge and understanding of Target Heart Rate Range (THRR);Understanding of Exercise Prescription Increase Physical Activity;Increase Strength and Stamina;Able to understand and use rate of perceived exertion (RPE) scale;Able to understand and use Dyspnea scale;Knowledge and understanding of Target Heart Rate Range (THRR);Understanding of Exercise Prescription     Comments Pt is scheduled to begin exercise next week. Will continue to monitor and progress as tolerated. Pt has completed 4 exercise sessions. She has progressed to performing nustep for 15 min at level 3, 2.4 METs. She also began walking track for 18 laps, METs 3.09, in 15 min the last session. She is progressing well. She performs warmup and cooldown independently standing.  Will continue to monitor and progress as tolerated. Will discuss home exercise plan with pt soon. Pt has completed 9 exercise sessions. she has missed 2 sessions for various reasons. She has progressed to performing nustep for 15 min at level 4, 2.3 METs. She also began walking track for 20 laps, METs 3.32, in 15 min the last session. She is progressing  well. She performs warmup and cooldown independently standing.  Will continue to monitor and progress as tolerated. She is walking 1 nonrehab day at home 30 min. Encouraged her to increase to atleast 2 nonrehab days.     Expected Outcomes Through exercise at rehab and home, the patient will decrease shortness of breath with daily activities and feel confident in carrying out an exercise regimen at home. Through exercise at rehab and home, the patient will decrease shortness of breath with daily activities and feel confident in carrying out an exercise regimen at home. Through exercise at rehab and home, the patient will decrease shortness of breath with daily activities and feel confident in carrying out an exercise regimen at home.              Discharge Exercise Prescription (Final Exercise Prescription Changes):  Exercise Prescription Changes - 05/27/22 1500       Response to Exercise   Blood Pressure (Admit) 116/64    Blood Pressure (Exercise) 128/70    Blood Pressure (Exit) 110/66    Heart Rate (Admit) 68 bpm    Heart Rate (Exercise) 102 bpm    Heart Rate (Exit) 72 bpm    Oxygen Saturation (Admit) 93 %    Oxygen Saturation (Exercise) 92 %    Oxygen Saturation (Exit) 96 %    Rating of Perceived Exertion (Exercise) 13    Perceived Dyspnea (Exercise) 1    Duration Continue with 30 min of aerobic exercise without signs/symptoms of physical distress.    Intensity THRR unchanged      Resistance Training   Training Prescription Yes    Weight red bands    Reps 10-15    Time 10 Minutes      NuStep   Level 4    SPM 80    Minutes 15    METs 2.3      Track   Laps 20  Minutes 15    METs 3.32             Nutrition:  Target Goals: Understanding of nutrition guidelines, daily intake of sodium <15107m, cholesterol <2033m calories 30% from fat and 7% or less from saturated fats, daily to have 5 or more servings of fruits and vegetables.  Biometrics:  Pre Biometrics -  04/21/22 1019       Pre Biometrics   Grip Strength 20 kg              Nutrition Therapy Plan and Nutrition Goals:  Nutrition Therapy & Goals - 05/22/22 1446       Nutrition Therapy   Diet Heart Healthy Diet      Personal Nutrition Goals   Nutrition Goal Patient to choose a variety of fruits, vegetables, whole grains, lean protein/plant protein, nonfat/lowfat dairy as part of heart healthy diet.    Personal Goal #2 Patient to limit to <150086mf sodium daily.    Personal Goal #3 Patient to limit and identify foods sources of saturated fat, trans fat, sodium, and refined carbohydrates    Comments Patient reports improved fatigue and energy level since making dietary changes including increased dietary fiber. She is taking miralax and stool softener daily; she continues to reports some looser stools.      Intervention Plan   Intervention Prescribe, educate and counsel regarding individualized specific dietary modifications aiming towards targeted core components such as weight, hypertension, lipid management, diabetes, heart failure and other comorbidities.    Expected Outcomes Short Term Goal: Understand basic principles of dietary content, such as calories, fat, sodium, cholesterol and nutrients.;Long Term Goal: Adherence to prescribed nutrition plan.             Nutrition Assessments:  Nutrition Assessments - 05/06/22 1559       Rate Your Plate Scores   Pre Score 62            MEDIFICTS Score Key: ?70 Need to make dietary changes  40-70 Heart Healthy Diet ? 40 Therapeutic Level Cholesterol Diet  Flowsheet Row PULMONARY REHAB OTHER RESPIRATORY [facility] Total Score on Admission 62      Picture Your Plate Scores: <40<93healthy dietary pattern with much room for improvement. 41-50 Dietary pattern unlikely to meet recommendations for good health and room for improvement. 51-60 More healthful  dietary pattern, with some room for improvement.  >60 Healthy dietary pattern, although there may be some specific behaviors that could be improved.    Nutrition Goals Re-Evaluation:  Nutrition Goals Re-Evaluation     RowWebsterme 05/06/22 1552 05/22/22 1446           Goals   Current Weight 188 lb 0.8 oz (85.3 kg) 184 lb 15.5 oz (83.9 kg)      Comment Patient reports ongoing fatigue. Patient reports ongoing constipation/diarhea and she is currently doing a trial of Linzess.She admits eating out frequently. Most recent labs show low/normal B12, triglycerides 181. She takes daily PO MVI and B12. Patient is down 4.2# since starting with our program.      Expected Outcome Discussed the importance of increasing to four servings of fruit and five servings of vegetables daily and strategies for reducing added sugar. Expect improvement to GI concerns with dietary changes. Goals in progress. Patient reports improved fatigue and energy level since making dietary changes including increased dietary fiber. She is taking miralax and stool softener daily; she  continues to reports some looser stools. Reviewed sodium intake for heart and pulmonary health.               Nutrition Goals Discharge (Final Nutrition Goals Re-Evaluation):  Nutrition Goals Re-Evaluation - 05/22/22 1446       Goals   Current Weight 184 lb 15.5 oz (83.9 kg)    Comment Patient is down 4.2# since starting with our program.    Expected Outcome Goals in progress. Patient reports improved fatigue and energy level since making dietary changes including increased dietary fiber. She is taking miralax and stool softener daily; she continues to reports some looser stools. Reviewed sodium intake for heart and pulmonary health.             Psychosocial: Target Goals: Acknowledge presence or absence of significant depression and/or stress, maximize coping skills, provide positive support system. Participant is able to verbalize types  and ability to use techniques and skills needed for reducing stress and depression.  Initial Review & Psychosocial Screening:  Initial Psych Review & Screening - 04/21/22 0937       Initial Review   Current issues with None Identified      Family Dynamics   Good Support System? Yes    Comments Lives in assistive living and feels supported by friends, faith community and family      Barriers   Psychosocial barriers to participate in program There are no identifiable barriers or psychosocial needs.      Screening Interventions   Interventions Encouraged to exercise             Quality of Life Scores:  Scores of 19 and below usually indicate a poorer quality of life in these areas.  A difference of  2-3 points is a clinically meaningful difference.  A difference of 2-3 points in the total score of the Quality of Life Index has been associated with significant improvement in overall quality of life, self-image, physical symptoms, and general health in studies assessing change in quality of life.  PHQ-9: Review Flowsheet  More data exists      05/08/2022 04/21/2022 12/23/2021 05/07/2021 12/17/2020  Depression screen PHQ 2/9  Decreased Interest 0 0 0 0 0  Down, Depressed, Hopeless 0 0 0 0 0  PHQ - 2 Score 0 0 0 0 0  Altered sleeping - 1 - - -  Tired, decreased energy - 0 - - -  Change in appetite - 1 - - -  Feeling bad or failure about yourself  - 0 - - -  Trouble concentrating - 0 - - -  Moving slowly or fidgety/restless - 0 - - -  Suicidal thoughts - 0 - - -  PHQ-9 Score - 2 - - -  Difficult doing work/chores - Somewhat difficult - - -   Interpretation of Total Score  Total Score Depression Severity:  1-4 = Minimal depression, 5-9 = Mild depression, 10-14 = Moderate depression, 15-19 = Moderately severe depression, 20-27 = Severe depression   Psychosocial Evaluation and Intervention:   Psychosocial Re-Evaluation:  Psychosocial Re-Evaluation     Row Name 04/22/22 8119  05/08/22 0856 06/05/22 1537         Psychosocial Re-Evaluation   Current issues with None Identified None Identified None Identified     Comments No change in psychosocial assessment since orientation. She begins exercising in 1 week. No psychosocial barriers or concerns identified at this time. No psychosocial concerns or barriers identified at this time.  Expected Outcomes For Katrece to continue to be without barriers or psychosocial concerns while participating in pulmonary rehab For Concha to continue to be without psychosocial concerns while participating in pulmonary rehab. Lezlie Lye to continue to be without psychosocial concerns or barriers while particpating in pulmonary rehab.     Interventions Encouraged to attend Pulmonary Rehabilitation for the exercise Encouraged to attend Pulmonary Rehabilitation for the exercise Encouraged to attend Pulmonary Rehabilitation for the exercise     Continue Psychosocial Services  No Follow up required No Follow up required No Follow up required              Psychosocial Discharge (Final Psychosocial Re-Evaluation):  Psychosocial Re-Evaluation - 06/05/22 1537       Psychosocial Re-Evaluation   Current issues with None Identified    Comments No psychosocial concerns or barriers identified at this time.    Expected Outcomes Gor Pedro to continue to be without psychosocial concerns or barriers while particpating in pulmonary rehab.    Interventions Encouraged to attend Pulmonary Rehabilitation for the exercise    Continue Psychosocial Services  No Follow up required             Education: Education Goals: Education classes will be provided on a weekly basis, covering required topics. Participant will state understanding/return demonstration of topics presented.  Learning Barriers/Preferences:  Learning Barriers/Preferences - 04/21/22 0946       Learning Barriers/Preferences   Learning Barriers Sight;Hearing    Learning  Preferences Individual Instruction;Group Instruction;Written Material;Verbal Instruction             Education Topics: Introduction to Pulmonary Rehab Group instruction provided by PowerPoint, verbal discussion, and written material to support subject matter. Instructor reviews what Pulmonary Rehab is, the purpose of the program, and how patients are referred.     Know Your Numbers Group instruction that is supported by a PowerPoint presentation. Instructor discusses importance of knowing and understanding resting, exercise, and post-exercise oxygen saturation, heart rate, and blood pressure. Oxygen saturation, heart rate, blood pressure, rating of perceived exertion, and dyspnea are reviewed along with a normal range for these values.    Exercise for the Pulmonary Patient Group instruction that is supported by a PowerPoint presentation. Instructor discusses benefits of exercise, core components of exercise, frequency, duration, and intensity of an exercise routine, importance of utilizing pulse oximetry during exercise, safety while exercising, and options of places to exercise outside of rehab.  Flowsheet Row PULMONARY REHAB OTHER RESPIRATORY from 05/29/2022 in [place]  Date 05/08/22  Educator Donnetta Simpers  [Handout]  Instruction Review Code 1- Verbalizes Understanding          MET Level  Group instruction provided by PowerPoint, verbal discussion, and written material to support subject matter. Instructor reviews what METs are and how to increase METs.    Pulmonary Medications Verbally interactive group education provided by instructor with focus on inhaled medications and proper administration. Flowsheet Row PULMONARY REHAB OTHER RESPIRATORY from 05/29/2022 in [place]  Date 05/29/22  Educator Donnetta Simpers  Instruction Review Code 2- Demonstrated Understanding       Anatomy and Physiology of the Respiratory  System Group instruction provided by PowerPoint, verbal discussion, and written material to support subject matter. Instructor reviews respiratory cycle and anatomical components of the respiratory system and their functions. Instructor also reviews differences in obstructive and restrictive respiratory diseases with examples of each.    Oxygen Safety Group instruction provided by PowerPoint, verbal  discussion, and written material to support subject matter. There is an overview of "What is Oxygen" and "Why do we need it".  Instructor also reviews how to create a safe environment for oxygen use, the importance of using oxygen as prescribed, and the risks of noncompliance. There is a brief discussion on traveling with oxygen and resources the patient may utilize.   Oxygen Use Group instruction provided by PowerPoint, verbal discussion, and written material to discuss how supplemental oxygen is prescribed and different types of oxygen supply systems. Resources for more information are provided.    Breathing Techniques Group instruction that is supported by demonstration and informational handouts. Instructor discusses the benefits of pursed lip and diaphragmatic breathing and detailed demonstration on how to perform both.     Risk Factor Reduction Group instruction that is supported by a PowerPoint presentation. Instructor discusses the definition of a risk factor, different risk factors for pulmonary disease, and how the heart and lungs work together.   MD Day A group question and answer session with a medical doctor that allows participants to ask questions that relate to their pulmonary disease state.   Nutrition for the Pulmonary Patient Group instruction provided by PowerPoint slides, verbal discussion, and written materials to support subject matter. The instructor gives an explanation and review of healthy diet recommendations, which includes a discussion on weight management,  recommendations for fruit and vegetable consumption, as well as protein, fluid, caffeine, fiber, sodium, sugar, and alcohol. Tips for eating when patients are short of breath are discussed. Flowsheet Row PULMONARY REHAB OTHER RESPIRATORY from 05/29/2022 in [place]  Date 05/22/22  Educator Sam  Instruction Review Code 2- Demonstrated Understanding        Other Education Group or individual verbal, written, or video instructions that support the educational goals of the pulmonary rehab program. 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             Core Components/Risk Factors/Patient Goals Review:   Goals and Risk Factor Review     Row Name 04/22/22 0930 05/08/22 0857 06/05/22 1539         Core Components/Risk Factors/Patient Goals Review   Personal Goals Review Weight Management/Obesity;Develop more efficient breathing techniques such as purse lipped breathing and diaphragmatic breathing and practicing self-pacing with activity.;Increase knowledge of respiratory medications and ability to use respiratory devices properly.;Improve shortness of breath with ADL's Weight Management/Obesity;Improve shortness of breath with ADL's;Develop more efficient breathing techniques such as purse  lipped breathing and diaphragmatic breathing and practicing self-pacing with activity.;Increase knowledge of respiratory medications and ability to use respiratory devices properly. Weight Management/Obesity;Improve shortness of breath with ADL's;Develop more efficient breathing techniques such as purse lipped breathing and diaphragmatic breathing and practicing self-pacing with activity.;Increase knowledge of respiratory medications  and ability to use respiratory devices properly.     Review Camyah will begin exercising in 1 week, not able to work on program goals at this time. Carman has just began exercising in pulmonary rehab and has attended 3 exercise sessions.  She is tolerating the exercise well, presently exercising at 2.3 mets on the nustep for 30 minutes, will continue to increase workloads as tolerated. Too early for weight loss. Tayanna has lost 1 kg since starting pulmonary rehab 04/29/2022.  She has progressed well and is exercising @ 2.2 mets on the nustep, and walking 20 laps on the track in 15 minutes.  She has improved her strength and stamina.     Expected Outcomes See admission goals. See admission goals. See admission goals.              Core Components/Risk Factors/Patient Goals at Discharge (Final Review):   Goals and Risk Factor Review - 06/05/22 1539       Core Components/Risk Factors/Patient Goals Review   Personal Goals Review Weight Management/Obesity;Improve shortness of breath with ADL's;Develop more efficient breathing techniques such as purse lipped breathing and diaphragmatic breathing and practicing self-pacing with activity.;Increase knowledge of respiratory medications and ability to use respiratory devices properly.    Review Mariely has lost 1 kg since starting pulmonary rehab 04/29/2022.  She has progressed well and is exercising @ 2.2 mets on the nustep, and walking 20 laps on the track in 15 minutes.  She has improved her strength and stamina.    Expected  Outcomes See admission goals.             ITP Comments:  ITP Comments     Row Name 04/21/22 1104           ITP Comments Pulmonary Rehab Medical Director - Dr. Loanne Drilling               Pt is making expected progress toward Pulmonary Rehab goals after completing 9 sessions. Recommend continued exercise, life style modification, education, and utilization of breathing techniques to increase stamina and strength, while also decreasing shortness of breath with exertion.  Dr. Rodman Pickle is Medical Director for Pulmonary Rehab at [facility].

## 2022-06-12 ENCOUNTER — Encounter (HOSPITAL_COMMUNITY): Payer: Medicare HMO

## 2022-06-12 ENCOUNTER — Telehealth (HOSPITAL_COMMUNITY): Payer: Self-pay | Admitting: Family Medicine

## 2022-06-13 ENCOUNTER — Telehealth (HOSPITAL_COMMUNITY): Payer: Self-pay | Admitting: *Deleted

## 2022-06-13 NOTE — Telephone Encounter (Signed)
Nancy Mccormick called to report ongoing knee pain and will not be able to finish PR. Confirmed with pt discharge from program. Yves Dill BS, ACSM-CEP 06/13/2022 9:11 AM

## 2022-06-17 ENCOUNTER — Encounter (HOSPITAL_COMMUNITY): Payer: Medicare HMO

## 2022-06-18 NOTE — Progress Notes (Signed)
Discharge Progress Report  Patient Details  Name: Nancy Mccormick MRN: [id number] Date of Birth: 11/03/1933 Referring Provider:   April [facility] Walk Test from 04/21/2022 in [facility]  Referring Provider Chi Elzie Rings, MD        Number of Visits: 9  Reason for Discharge: Early Exit: Anikah had been out due to knee pain. She called and requested that she wanted to be discharged due to this. She was discharged at her request.  Smoking History:  Social History   Tobacco Use  Smoking Status Former   Types: Cigarettes   Quit date: 12/17/1986   Years since quitting: 35.5  Smokeless Tobacco Never  Tobacco Comments   Smoke briefly in the distant past    Diagnosis:  Shortness of breath  ADL [facility]:  Pulmonary Assessment Scores     Row Name 04/21/22 0958         ADL [facility]SD   ADL Phase Entry     SOB Score total 78       CAT Score   CAT Score pre 22       mMRC Score   mMRC Score 4              Initial Exercise Prescription:  Initial Exercise Prescription - 04/21/22 1500       Date of Initial Exercise RX and Referring Provider   Date 04/21/22    Referring Provider Chi Elzie Rings, MD    Expected Discharge Date 06/26/22      NuStep   Level 1    SPM 75    Minutes 30    METs 1.6      Prescription Details   Frequency (times per week) 2    Duration Progress to 30 minutes of continuous aerobic without signs/symptoms of physical distress      Intensity   THRR 40-80% of Max Heartrate 54-108    Ratings of Perceived Exertion 11-13    Perceived Dyspnea 0-4      Progression   Progression Continue progressive overload as per policy without signs/symptoms or physical distress.      Resistance Training   Training Prescription Yes    Weight Yellow bands    Reps 10-15             Discharge Exercise Prescription (Final Exercise Prescription Changes):  Exercise Prescription Changes - 05/27/22 1500        Response to Exercise   Blood Pressure (Admit) 116/64    Blood Pressure (Exercise) 128/70    Blood Pressure (Exit) 110/66    Heart Rate (Admit) 68 bpm    Heart Rate (Exercise) 102 bpm    Heart Rate (Exit) 72 bpm    Oxygen Saturation (Admit) 93 %    Oxygen Saturation (Exercise) 92 %    Oxygen Saturation (Exit) 96 %    Rating of Perceived Exertion (Exercise) 13    Perceived Dyspnea (Exercise) 1    Duration Continue with 30 min of aerobic exercise without signs/symptoms of physical distress.    Intensity THRR unchanged      Resistance Training   Training Prescription Yes    Weight red bands    Reps 10-15    Time 10 Minutes      NuStep   Level 4    SPM 80    Minutes 15    METs 2.3      Track   Laps 20    Minutes 15  METs 3.32             Functional Capacity:  6 Minute Walk     Row Name 04/21/22 0852         6 Minute Walk   Phase Initial     Distance 636 feet     Walk Time 6 minutes  Pt took 2 breaks: 3:20-4:14 and 5:20 to 6:00 due to elevated HR     # of Rest Breaks 2  Total of 2 breaks:  93 seconds     MPH 1.58     METS 1.64     RPE 13     Perceived Dyspnea  2     VO2 Peak 5.75     Symptoms Yes (comment)     Comments SOB, RPD = 2. PT exceeding THR     Resting HR 74 bpm     Resting BP 138/60     Resting Oxygen Saturation  95 %     Exercise Oxygen Saturation  during 6 min walk 96 %     Max Ex. HR 150 bpm     Max Ex. BP 152/80     2 Minute Post BP 144/80       Interval HR   1 Minute HR 102     2 Minute HR 125     3 Minute HR 142     4 Minute HR 92     5 Minute HR 150     6 Minute HR 91     2 Minute Post HR 71     Interval Heart Rate? Yes       Interval Oxygen   Interval Oxygen? Yes     Baseline Oxygen Saturation % 95 %     1 Minute Oxygen Saturation % 93 %     1 Minute Liters of Oxygen 0 L     2 Minute Oxygen Saturation % 93 %     2 Minute Liters of Oxygen 0 L     3 Minute Oxygen Saturation % 92 %     3 Minute Liters of Oxygen 0 L     4  Minute Oxygen Saturation % 95 %     4 Minute Liters of Oxygen 0 L     5 Minute Oxygen Saturation % 93 %     5 Minute Liters of Oxygen 0 L     6 Minute Oxygen Saturation % 96 %     6 Minute Liters of Oxygen 0 L     2 Minute Post Oxygen Saturation % 97 %     2 Minute Post Liters of Oxygen 0 L              Psychological, QOL, Others - Outcomes: PHQ 2/9:    05/08/2022    9:32 AM 04/21/2022    9:49 AM 12/23/2021    9:43 AM 05/07/2021    1:23 PM 12/17/2020    9:43 AM  Depression screen PHQ 2/9  Decreased Interest 0 0 0 0 0  Down, Depressed, Hopeless 0 0 0 0 0  PHQ - 2 Score 0 0 0 0 0  Altered sleeping  1     Tired, decreased energy  0     Change in appetite  1     Feeling bad or failure about yourself   0     Trouble concentrating  0     Moving slowly or fidgety/restless  0  Suicidal thoughts  0     PHQ-9 Score  2     Difficult doing work/chores  Somewhat difficult       Quality of Life:   Personal Goals: Goals established at orientation with interventions provided to work toward goal.  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              Personal Goals Discharge:  Goals and Risk Factor Review     Row Name 04/22/22 0930 05/08/22 0857 06/05/22 1539         Core Components/Risk Factors/Patient Goals Review   Personal Goals Review Weight Management/Obesity;Develop more efficient breathing techniques such as purse lipped breathing and diaphragmatic breathing and practicing self-pacing with activity.;Increase knowledge of respiratory medications and ability to use respiratory devices properly.;Improve shortness of breath with ADL's Weight Management/Obesity;Improve shortness of breath with ADL's;Develop more efficient  breathing techniques such as purse lipped breathing and diaphragmatic breathing and practicing self-pacing with activity.;Increase knowledge of respiratory medications and ability to use respiratory devices properly. Weight Management/Obesity;Improve shortness of breath with ADL's;Develop more efficient breathing techniques such as purse lipped breathing and diaphragmatic breathing and practicing self-pacing with activity.;Increase knowledge of respiratory medications and ability to use respiratory devices properly.     Review Tasia will begin exercising in 1 week, not able to work on program goals at this time. Earl has just began exercising in pulmonary rehab and has attended 3 exercise sessions.  She is tolerating the exercise well, presently exercising at 2.3 mets on the nustep for 30 minutes, will continue to increase workloads as tolerated. Too early for weight loss. Lundyn has lost 1 kg since starting pulmonary rehab 04/29/2022.  She has progressed well and is exercising @ 2.2 mets on the nustep, and walking 20 laps on the track in 15 minutes.  She has improved her strength and stamina.     Expected Outcomes See admission goals. See admission goals. See admission goals.              Exercise Goals and Review:  Exercise Goals     Row Name 04/21/22 1550 04/22/22 0913 05/09/22 0847 06/04/22 0908       Exercise Goals   Increase Physical Activity Yes Yes Yes Yes    Intervention Provide advice, education, support and counseling about physical activity/exercise needs.;Develop an individualized exercise prescription for aerobic and resistive training based on initial evaluation findings, risk stratification, comorbidities and participant's personal goals. Provide advice, education, support and counseling about physical activity/exercise needs.;Develop an individualized exercise prescription for aerobic and resistive training based on initial evaluation findings, risk stratification,  comorbidities and participant's personal goals. Provide advice, education, support and counseling about physical activity/exercise needs.;Develop an individualized exercise prescription for aerobic and resistive training based on initial evaluation findings, risk stratification, comorbidities and participant's personal goals. Provide advice, education, support and counseling about physical activity/exercise needs.;Develop an individualized exercise prescription for aerobic and resistive training based on initial evaluation findings, risk stratification, comorbidities and participant's personal goals.    Expected Outcomes Short Term: Attend rehab on a regular basis to increase amount of physical activity.;Long Term: Add in home exercise to make exercise part of routine and to increase amount of physical activity.;Long Term: Exercising regularly at least 3-5  days a week. Short Term: Attend rehab on a regular basis to increase amount of physical activity.;Long Term: Add in home exercise to make exercise part of routine and to increase amount of physical activity.;Long Term: Exercising regularly at least 3-5 days a week. Short Term: Attend rehab on a regular basis to increase amount of physical activity.;Long Term: Add in home exercise to make exercise part of routine and to increase amount of physical activity.;Long Term: Exercising regularly at least 3-5 days a week. Short Term: Attend rehab on a regular basis to increase amount of physical activity.;Long Term: Add in home exercise to make exercise part of routine and to increase amount of physical activity.;Long Term: Exercising regularly at least 3-5 days a week.    Increase Strength and Stamina Yes Yes Yes Yes    Intervention Provide advice, education, support and counseling about physical activity/exercise needs.;Develop an individualized exercise prescription for aerobic and resistive training based on initial evaluation findings, risk stratification,  comorbidities and participant's personal goals. Provide advice, education, support and counseling about physical activity/exercise needs.;Develop an individualized exercise prescription for aerobic and resistive training based on initial evaluation findings, risk stratification, comorbidities and participant's personal goals. Provide advice, education, support and counseling about physical activity/exercise needs.;Develop an individualized exercise prescription for aerobic and resistive training based on initial evaluation findings, risk stratification, comorbidities and participant's personal goals. Provide advice, education, support and counseling about physical activity/exercise needs.;Develop an individualized exercise prescription for aerobic and resistive training based on initial evaluation findings, risk stratification, comorbidities and participant's personal goals.    Expected Outcomes Long Term: Improve cardiorespiratory fitness, muscular endurance and strength as measured by increased METs and functional capacity (6MWT);Short Term: Perform resistance training exercises routinely during rehab and add in resistance training at home;Short Term: Increase workloads from initial exercise prescription for resistance, speed, and METs. Long Term: Improve cardiorespiratory fitness, muscular endurance and strength as measured by increased METs and functional capacity (6MWT);Short Term: Perform resistance training exercises routinely during rehab and add in resistance training at home;Short Term: Increase workloads from initial exercise prescription for resistance, speed, and METs. Long Term: Improve cardiorespiratory fitness, muscular endurance and strength as measured by increased METs and functional capacity (6MWT);Short Term: Perform resistance training exercises routinely during rehab and add in resistance training at home;Short Term: Increase workloads from initial exercise prescription for resistance, speed,  and METs. Long Term: Improve cardiorespiratory fitness, muscular endurance and strength as measured by increased METs and functional capacity (6MWT);Short Term: Perform resistance training exercises routinely during rehab and add in resistance training at home;Short Term: Increase workloads from initial exercise prescription for resistance, speed, and METs.    Able to understand and use rate of perceived exertion (RPE) scale Yes Yes Yes Yes    Intervention Provide education and explanation on how to use RPE scale Provide education and explanation on how to use RPE scale Provide education and explanation on how to use RPE scale Provide education and explanation on how to use RPE scale    Expected Outcomes Short Term: Able to use RPE daily in rehab to express subjective intensity level;Long Term:  Able to use RPE to guide intensity level when exercising independently Short Term: Able to use RPE daily in rehab to express subjective intensity level;Long Term:  Able to use RPE to guide intensity level when exercising independently Short Term: Able to use RPE daily in rehab to express subjective intensity level;Long Term:  Able to use RPE to guide intensity level when exercising independently  Short Term: Able to use RPE daily in rehab to express subjective intensity level;Long Term:  Able to use RPE to guide intensity level when exercising independently    Able to understand and use Dyspnea scale Yes Yes Yes Yes    Intervention Provide education and explanation on how to use Dyspnea scale Provide education and explanation on how to use Dyspnea scale Provide education and explanation on how to use Dyspnea scale Provide education and explanation on how to use Dyspnea scale    Expected Outcomes Short Term: Able to use Dyspnea scale daily in rehab to express subjective sense of shortness of breath during exertion;Long Term: Able to use Dyspnea scale to guide intensity level when exercising independently Short Term: Able  to use Dyspnea scale daily in rehab to express subjective sense of shortness of breath during exertion;Long Term: Able to use Dyspnea scale to guide intensity level when exercising independently Short Term: Able to use Dyspnea scale daily in rehab to express subjective sense of shortness of breath during exertion;Long Term: Able to use Dyspnea scale to guide intensity level when exercising independently Short Term: Able to use Dyspnea scale daily in rehab to express subjective sense of shortness of breath during exertion;Long Term: Able to use Dyspnea scale to guide intensity level when exercising independently    Knowledge and understanding of Target Heart Rate Range (THRR) Yes Yes Yes Yes    Intervention Provide education and explanation of THRR including how the numbers were predicted and where they are located for reference Provide education and explanation of THRR including how the numbers were predicted and where they are located for reference Provide education and explanation of THRR including how the numbers were predicted and where they are located for reference Provide education and explanation of THRR including how the numbers were predicted and where they are located for reference    Expected Outcomes Short Term: Able to state/look up THRR;Short Term: Able to use daily as guideline for intensity in rehab;Long Term: Able to use THRR to govern intensity when exercising independently Short Term: Able to state/look up THRR;Short Term: Able to use daily as guideline for intensity in rehab;Long Term: Able to use THRR to govern intensity when exercising independently Short Term: Able to state/look up THRR;Short Term: Able to use daily as guideline for intensity in rehab;Long Term: Able to use THRR to govern intensity when exercising independently Short Term: Able to state/look up THRR;Short Term: Able to use daily as guideline for intensity in rehab;Long Term: Able to use THRR to govern intensity when  exercising independently    Understanding of Exercise Prescription Yes Yes Yes Yes    Intervention Provide education, explanation, and written materials on patient's individual exercise prescription Provide education, explanation, and written materials on patient's individual exercise prescription Provide education, explanation, and written materials on patient's individual exercise prescription Provide education, explanation, and written materials on patient's individual exercise prescription    Expected Outcomes Short Term: Able to explain program exercise prescription;Long Term: Able to explain home exercise prescription to exercise independently Short Term: Able to explain program exercise prescription;Long Term: Able to explain home exercise prescription to exercise independently Short Term: Able to explain program exercise prescription;Long Term: Able to explain home exercise prescription to exercise independently Short Term: Able to explain program exercise prescription;Long Term: Able to explain home exercise prescription to exercise independently             Exercise Goals Re-Evaluation:  Exercise Goals Re-Evaluation  [place] Name 04/22/22 0913 05/09/22 0848 06/04/22 0909         Exercise Goal Re-Evaluation   Exercise Goals Review Increase Physical Activity;Increase Strength and Stamina;Able to understand and use rate of perceived exertion (RPE) scale;Able to understand and use Dyspnea scale;Knowledge and understanding of Target Heart Rate Range (THRR);Understanding of Exercise Prescription Increase Physical Activity;Increase Strength and Stamina;Able to understand and use rate of perceived exertion (RPE) scale;Able to understand and use Dyspnea scale;Knowledge and understanding of Target Heart Rate Range (THRR);Understanding of Exercise Prescription Increase Physical Activity;Increase Strength and Stamina;Able to understand and use rate of perceived exertion (RPE) scale;Able to understand  and use Dyspnea scale;Knowledge and understanding of Target Heart Rate Range (THRR);Understanding of Exercise Prescription     Comments Pt is scheduled to begin exercise next week. Will continue to monitor and progress as tolerated. Pt has completed 4 exercise sessions. She has progressed to performing nustep for 15 min at level 3, 2.4 METs. She also began walking track for 18 laps, METs 3.09, in 15 min the last session. She is progressing well. She performs warmup and cooldown independently standing.  Will continue to monitor and progress as tolerated. Will discuss home exercise plan with pt soon. Pt has completed 9 exercise sessions. she has missed 2 sessions for various reasons. She has progressed to performing nustep for 15 min at level 4, 2.3 METs. She also began walking track for 20 laps, METs 3.32, in 15 min the last session. She is progressing well. She performs warmup and cooldown independently standing.  Will continue to monitor and progress as tolerated. She is walking 1 nonrehab day at home 30 min. Encouraged her to increase to atleast 2 nonrehab days.     Expected Outcomes Through exercise at rehab and home, the patient will decrease shortness of breath with daily activities and feel confident in carrying out an exercise regimen at home. Through exercise at rehab and home, the patient will decrease shortness of breath with daily activities and feel confident in carrying out an exercise regimen at home. Through exercise at rehab and home, the patient will decrease shortness of breath with daily activities and feel confident in carrying out an exercise regimen at home.              Nutrition & Weight - Outcomes:  Pre Biometrics - 04/21/22 1019       Pre Biometrics   Grip Strength 20 kg              Nutrition:  Nutrition Therapy & Goals - 05/22/22 1446       Nutrition Therapy   Diet Heart Healthy Diet      Personal Nutrition Goals   Nutrition Goal Patient to choose a variety  of fruits, vegetables, whole grains, lean protein/plant protein, nonfat/lowfat dairy as part of heart healthy diet.    Personal Goal #2 Patient to limit to '1500mg'$  of sodium daily.    Personal Goal #3 Patient to limit and identify foods sources of saturated fat, trans fat, sodium, and refined carbohydrates    Comments Patient reports improved fatigue and energy level since making dietary changes including increased dietary fiber. She is taking miralax and stool softener daily; she continues to reports some looser stools.      Intervention Plan   Intervention Prescribe, educate and counsel regarding individualized specific dietary modifications aiming towards targeted core components such as weight, hypertension, lipid management, diabetes, heart failure and other comorbidities.    Expected Outcomes Short Term  Goal: Understand basic principles of dietary content, such as calories, fat, sodium, cholesterol and nutrients.;Long Term Goal: Adherence to prescribed nutrition plan.             Nutrition Discharge:  Nutrition Assessments - 05/06/22 1559       Rate Your Plate Scores   Pre Score 62             Education Questionnaire Score:  Knowledge Questionnaire Score - 04/21/22 0956       Knowledge Questionnaire Score   Pre Score 17/18             Goals reviewed with patient; copy given to patient.

## 2022-06-19 ENCOUNTER — Encounter (HOSPITAL_COMMUNITY): Payer: Medicare HMO

## 2022-06-24 ENCOUNTER — Encounter (HOSPITAL_COMMUNITY): Payer: Medicare HMO

## 2022-06-26 ENCOUNTER — Encounter (HOSPITAL_COMMUNITY): Payer: Medicare HMO

## 2022-09-11 DIAGNOSIS — M79645 Pain in left finger(s): Secondary | ICD-10-CM | POA: Insufficient documentation

## 2022-09-11 DIAGNOSIS — M19042 Primary osteoarthritis, left hand: Secondary | ICD-10-CM | POA: Insufficient documentation

## 2022-10-11 ENCOUNTER — Other Ambulatory Visit: Payer: Self-pay | Admitting: Family Medicine

## 2022-10-15 ENCOUNTER — Ambulatory Visit (INDEPENDENT_AMBULATORY_CARE_PROVIDER_SITE_OTHER): Payer: Medicare HMO | Admitting: Physician Assistant

## 2022-10-15 ENCOUNTER — Encounter: Payer: Self-pay | Admitting: Physician Assistant

## 2022-10-15 VITALS — BP 134/80 | HR 100 | Temp 97.5°F | Ht 64.0 in | Wt 188.0 lb

## 2022-10-15 DIAGNOSIS — J029 Acute pharyngitis, unspecified: Secondary | ICD-10-CM | POA: Diagnosis not present

## 2022-10-15 DIAGNOSIS — R52 Pain, unspecified: Secondary | ICD-10-CM | POA: Diagnosis not present

## 2022-10-15 LAB — POC COVID19 BINAXNOW: SARS Coronavirus 2 Ag: NEGATIVE

## 2022-10-15 LAB — POCT RAPID STREP A (OFFICE): Rapid Strep A Screen: NEGATIVE

## 2022-10-15 NOTE — Patient Instructions (Signed)
Vitals:   10/15/22 1130  BP: 134/80  Pulse: 100  Temp: (!) 97.5 F (36.4 C)  SpO2: 94%

## 2022-10-15 NOTE — Progress Notes (Signed)
Nancy Mccormick is a 89 y.o. female here for a new problem.  History of Present Illness:   Chief Complaint  Patient presents with   Sore Throat    Pt c/o sore throat, body aches, slight dizziness, cough. Denies fever or chills.    HPI  Sore throat She reports having a sore throat that started yesterday afternoon. She felt fine when she woke up yesterday morning. Accompanying nasal congestion, productive cough, and body aches. Her sore throat has improved and her primary complaint is her body aches. She took Coricidin with some symptom relief. She denies any fever or chills.  She has had a few episodes of mild dizziness. Patient reports that she has not eaten in the last day due to feeling unwell.  She has been drinking several bottles of water per day.  She has not taken any home COVID tests or had other viral illness testing. She does have COVID tests available to her at home. She lives in a senior independent living community and states that there is often illness in the community but she denies any particular sick contacts.  She recently completed pulmonary rehab and had been doing well until her current episode of symptoms.  Past Medical History:  Diagnosis Date   Arthritis    back, left shoulder, and fingers   Atrial fibrillation ([place])    History of chicken pox    Hyperlipidemia    Hypertension    Osteoporosis    Stroke [facility])      Social History   Tobacco Use   Smoking status: Former    Types: Cigarettes    Quit date: 12/17/1986    Years since quitting: 35.8   Smokeless tobacco: Never   Tobacco comments:    Smoke briefly in the distant past  Vaping Use   Vaping Use: Never used  Substance Use Topics   Alcohol use: No   Drug use: No    Past Surgical History:  Procedure Laterality Date   CATARACT EXTRACTION     KNEE ARTHROSCOPY Right 07/04/2015   Procedure: RIGHT KNEE ARTHROSCOPY WITH MEDIAL MENISCAL DEBRIDEMENT AND CHONDROPLASTY;  Surgeon: Gaynelle Arabian,  MD;  Location: [facility];  Service: Orthopedics;  Laterality: Right;   OPEN REDUCTION INTERNAL FIXATION (ORIF) DISTAL RADIAL FRACTURE Right 12/28/2012   Procedure: OPEN REDUCTION INTERNAL FIXATION (ORIF) DISTAL RADIAL FRACTURE;  Surgeon: Hessie Dibble, MD;  Location: [place];  Service: Orthopedics;  Laterality: Right;   VAGINAL HYSTERECTOMY      Family History  Problem Relation Age of Onset   Stroke Mother 38   Anemia Mother    Heart disease Mother    Hearing loss Mother    Heart attack Mother    Hypertension Mother    Stroke Father 33   Early death Father    Hypertension Father    Stroke Sister 18   Diabetes Sister    Hypertension Sister    Hypertension Son     Allergies  Allergen Reactions   Shellfish Allergy Itching, Swelling and Rash    Current Medications:   Current Outpatient Medications:    acetaminophen (TYLENOL) 500 MG tablet, Take 1,000 mg by mouth every 6 (six) hours as needed for mild pain or moderate pain., Disp: , Rfl:    Calcium Carbonate-Vitamin D (CALCIUM + D PO), Take 1 tablet by mouth at bedtime., Disp: , Rfl:    Cholecalciferol 50 MCG (2000 UT) TABS, Take by mouth., Disp: , Rfl:    diltiazem (CARDIZEM  CD) 180 MG 24 hr capsule, Take 1 capsule (180 mg total) by mouth daily., Disp: 90 capsule, Rfl: 3   ELIQUIS 5 MG TABS tablet, TAKE 1 TABLET(5 MG) BY MOUTH TWICE DAILY, Disp: 180 tablet, Rfl: 1   fluticasone (FLONASE) 50 MCG/ACT nasal spray, Place 2 sprays into both nostrils daily., Disp: 16 g, Rfl: 6   Multiple Vitamin (MULTIVITAMIN WITH MINERALS) TABS tablet, Take 1 tablet by mouth at bedtime., Disp: , Rfl:    nitroGLYCERIN (NITROSTAT) 0.4 MG SL tablet, Place 1 tablet (0.4 mg total) under the tongue as needed., Disp: 25 tablet, Rfl: 11   polyethylene glycol (MIRALAX / GLYCOLAX) 17 g packet, Take 17 g by mouth daily., Disp: , Rfl:    Review of Systems:   Review of Systems  Constitutional:  Negative for chills and fever.  HENT:  Positive  for congestion and sore throat. Negative for sinus pain.   Respiratory:  Positive for cough. Negative for shortness of breath and wheezing.   Cardiovascular:  Negative for chest pain and palpitations.  Gastrointestinal:  Negative for blood in stool, constipation, diarrhea, nausea and vomiting.  Genitourinary:  Negative for dysuria, frequency and hematuria.  Musculoskeletal:  Positive for myalgias.  Neurological:  Positive for dizziness.    Vitals:   Vitals:   10/15/22 1130  BP: 134/80  Pulse: 100  Temp: (!) 97.5 F (36.4 C)  TempSrc: Temporal  SpO2: 94%  Weight: 188 lb (85.3 kg)  Height: '5\' 4"'$  (1.626 m)     Body mass index is 32.27 kg/m.  Physical Exam:   Physical Exam Vitals and nursing note reviewed.  Constitutional:      General: She is not in acute distress.    Appearance: She is well-developed. She is not ill-appearing or toxic-appearing.  HENT:     Mouth/Throat:     Lips: Pink.     Mouth: Mucous membranes are moist.     Pharynx: Oropharynx is clear. Posterior oropharyngeal erythema present.     Tonsils: No tonsillar exudate.  Cardiovascular:     Rate and Rhythm: Normal rate and regular rhythm.     Pulses: Normal pulses.     Heart sounds: Normal heart sounds, S1 normal and S2 normal.  Pulmonary:     Effort: Pulmonary effort is normal.     Breath sounds: Normal breath sounds.  Skin:    General: Skin is warm and dry.  Neurological:     Mental Status: She is alert.     GCS: GCS eye subscore is 4. GCS verbal subscore is 5. GCS motor subscore is 6.  Psychiatric:        Speech: Speech normal.        Behavior: Behavior normal. Behavior is cooperative.    Results for orders placed or performed in visit on 10/15/22  POCT rapid strep A  Result Value Ref Range   Rapid Strep A Screen Negative Negative  POC COVID-19  Result Value Ref Range   SARS Coronavirus 2 Ag Negative Negative     Assessment and Plan:   Sore throat; Body aches Strep and COVID test are  negative She has had sx less than 24 hours, I do recommend that she take another COVID test in a few days and message [place] if positive and we will send in molnupiravir Thankfully she is improved today I recommend that she push fluids and rest If any new/worsening sx, please reach out No red flags on my exam today and no indication for abx  treatment at this time  I,Alexis Herring,acting as a scribe for [employer], PA.,have documented all relevant documentation on the behalf of Inda Coke, PA,as directed by  Inda Coke, PA while in the presence of Inda Coke, [place].  I, Inda Coke, [place], have reviewed all documentation for this visit. The documentation on 10/15/22 for the exam, diagnosis, procedures, and orders are all accurate and complete.   Inda Coke, PA-C

## 2022-10-18 ENCOUNTER — Telehealth: Payer: Self-pay | Admitting: Internal Medicine

## 2022-10-18 DIAGNOSIS — R051 Acute cough: Secondary | ICD-10-CM

## 2022-10-18 MED ORDER — DEXTROMETHORPHAN-GUAIFENESIN 10-100 MG/5ML PO LIQD: 10.0000 mL | ORAL | 1 refills | Status: DC | PRN

## 2022-10-18 MED ORDER — SIMPLY SALINE 0.9 % NA AERS: 2.0000 | INHALATION_SPRAY | NASAL | 11 refills | Status: DC

## 2022-10-18 NOTE — Telephone Encounter (Signed)
This very polite lady called the on-call nursing and reported that all of her symptoms from her visit with Dr. Inda Coke a couple days ago have gotten better except for the cough which is still very bad.  However it is not really much worse and she denies any new symptoms such as shortness of breath.  She was able to count to 20 without taking a breath on the phone with me.  I had her cough for me and it was a very [place] cough sounded like it was in her upper respiratory tree with mucus but difficult to tell over the phone.  She was definitely not having any difficulty speaking in full sentences and so I advised her I do not think she needs to go to the emergency room or urgent care since she is getting better on everything but the cough.  I advised her to try doing sinus rinses with simply saline and taking a cough syrup and if still not better on Monday come and see [place] in office.  I gave her warning to go to the ER if she gets worse.  She said she will have someone go pick up the meds at the pharmacy

## 2022-10-20 NOTE — Telephone Encounter (Signed)
Pt was advised by Dr Randol Kern. Please see previous msg.   Patient Name: Nancy Mccormick Gender: Female DOB: 1934-09-11 Age: 89 Y 3 M 3 D Return Phone Number: [phone] (Primary) Address: City/ State/ Zip: [place]  [address] Client [place] [facility] [place] Client Site [place] [facility] [place] Night Provider Dimas Chyle- MD Contact Type Call Who Is Calling Patient / Member / Family / Caregiver Call Type Triage / Clinical Relationship To Patient Self Return Phone Number [phone] (Primary) Chief Complaint Diarrhea Reason for Call Symptomatic / Request for Health Information Initial Comment Caller states she was seen on Tuesday. She had a COVID test and a test for her throat. She said their were signs of strep but did not give her anything. She has had diarrhea for 3 days. She has a cough but her throat feels better. She has been able to urinate. She can only drink Ginger Ale. Translation No Nurse Assessment Nurse: Ronnell Freshwater, RN, Harrell Gave Date/Time (Eastern Time): 10/18/2022 5:36:49 PM Confirm and document reason for call. If symptomatic, describe symptoms. ---Caller states that she developed diarrhea three days ago. She says that she was seen on Tuesday in the clinic and tested for strep and COVID. Denies fever, N/V. Does the patient have any new or worsening symptoms? ---Yes Will a triage be completed? ---Yes Related visit to physician within the last 2 weeks? ---No Does the PT have any chronic conditions? (i.e. diabetes, asthma, this includes High risk factors for pregnancy, etc.) ---Yes List chronic conditions. ---AF Is this a behavioral health or substance abuse call? ---No Guidelines Guideline Title Affirmed Question Affirmed Notes Nurse Date/Time (Eastern Time) Influenza (Flu) - Seasonal Patient is HIGH RISK (e.g., age > 67 years, pregnant, HIV Ronnell Freshwater, RN, Harrell Gave 10/18/2022  5:40:54 PM Guidelines Guideline Title Affirmed Question Affirmed Notes Nurse Date/Time [name] Time) +, or chronic medical condition) Cough - Acute Productive Cough Ronnell Freshwater, RNHarrell Gave 10/18/2022 5:48:01 PM Disp. Time [name] Time) Disposition Final User 10/18/2022 5:47:45 PM Call PCP within 24 Hours [place], Charolette Child 10/18/2022 5:54:37 PM Called On-Call Provider Ronnell Freshwater, RN, Christopher 10/18/2022 5:55:56 PM Home Care Yes Ronnell Freshwater, RN, Christopher Final Disposition 10/18/2022 5:55:56 [place], RN, Harrell Gave Caller Disagree/Comply Comply Caller Understands Yes PreDisposition Call Doctor Care Advice Given Per Guideline CALL PCP WITHIN 24 HOURS: * IF OFFICE WILL BE CLOSED: I'll page the on-call provider now. EXCEPTION: from 9 pm to 9 am. Since this isn't urgent, we'll hold the page until morning. CALL BACK IF: * Fever lasts over 3 days * Runny nose lasts over 10 days * Cough lasts over 3 weeks * Difficulty breathing occurs * You become worse CARE ADVICE given per Influenza - Seasonal (Adult) guideline. HOME CARE: * You should be able to treat this at home. CALL BACK IF: * Fever lasts over 3 days * Nasal discharge lasts over 10 days * Earache or facial pain develops * You become worse Comments User: Margret Chance, RN Date/Time [name] Time): 10/18/2022 5:51:53 PM Pharmacy: [facility] Phone DateTime Result/ Outcome Message Type Notes Berniece Pap- MD [phone] 10/18/2022 5:54:37 PM Called On Call Provider - Reached Doctor Paged Paging DoctorName Phone DateTime Result/ Outcome Message Type Notes Berniece Pap- MD 10/18/2022 5:55:11 PM Spoke with On Call - General Message Result Warm transferred OCP to caller

## 2022-10-22 NOTE — Telephone Encounter (Signed)
See note

## 2022-10-23 NOTE — Telephone Encounter (Signed)
Recommend appointment if she is not feeling better.  Algis Greenhouse. Jerline Pain, MD 10/23/2022 8:01 AM

## 2022-10-23 NOTE — Telephone Encounter (Signed)
Patient has OV on 11/10/2022

## 2022-11-10 ENCOUNTER — Ambulatory Visit (INDEPENDENT_AMBULATORY_CARE_PROVIDER_SITE_OTHER): Payer: Medicare HMO | Admitting: Family Medicine

## 2022-11-10 ENCOUNTER — Encounter: Payer: Self-pay | Admitting: Family Medicine

## 2022-11-10 VITALS — BP 138/70 | HR 67 | Temp 97.7°F | Ht 64.0 in | Wt 184.4 lb

## 2022-11-10 DIAGNOSIS — I10 Essential (primary) hypertension: Secondary | ICD-10-CM

## 2022-11-10 DIAGNOSIS — R0602 Shortness of breath: Secondary | ICD-10-CM | POA: Diagnosis not present

## 2022-11-10 DIAGNOSIS — R739 Hyperglycemia, unspecified: Secondary | ICD-10-CM

## 2022-11-10 DIAGNOSIS — E785 Hyperlipidemia, unspecified: Secondary | ICD-10-CM | POA: Diagnosis not present

## 2022-11-10 DIAGNOSIS — I4891 Unspecified atrial fibrillation: Secondary | ICD-10-CM

## 2022-11-10 LAB — COMPREHENSIVE METABOLIC PANEL
ALT: 17 U/L (ref 0–35)
AST: 13 U/L (ref 0–37)
Albumin: 4 g/dL (ref 3.5–5.2)
Alkaline Phosphatase: 88 U/L (ref 39–117)
BUN: 15 mg/dL (ref 6–23)
CO2: 28 mEq/L (ref 19–32)
Calcium: 9.4 mg/dL (ref 8.4–10.5)
Chloride: 106 mEq/L (ref 96–112)
Creatinine, Ser: 0.78 mg/dL (ref 0.40–1.20)
GFR: 68.82 mL/min (ref >60.00)
Glucose, Bld: 104 mg/dL — ABNORMAL HIGH (ref 70–99)
Potassium: 4.6 mEq/L (ref 3.5–5.1)
Sodium: 143 mEq/L (ref 135–145)
Total Bilirubin: 0.6 mg/dL (ref 0.2–1.2)
Total Protein: 6.5 g/dL (ref 6.0–8.3)

## 2022-11-10 LAB — LIPID PANEL
Cholesterol: 227 mg/dL — ABNORMAL HIGH (ref 0–200)
HDL: 58.6 mg/dL (ref >39.00)
LDL Cholesterol: 146 mg/dL — ABNORMAL HIGH (ref 0–99)
NonHDL: 168.2
Total CHOL/HDL Ratio: 4
Triglycerides: 112 mg/dL (ref 0.0–149.0)
VLDL: 22.4 mg/dL (ref 0.0–40.0)

## 2022-11-10 LAB — HEMOGLOBIN A1C: Hgb A1c MFr Bld: 6.1 % (ref 4.6–6.5)

## 2022-11-10 LAB — CBC
HCT: 39.3 % (ref 36.0–46.0)
Hemoglobin: 13.6 g/dL (ref 12.0–15.0)
MCHC: 34.5 g/dL (ref 30.0–36.0)
MCV: 93.6 fl (ref 78.0–100.0)
Platelets: 382 10*3/uL (ref 150.0–400.0)
RBC: 4.2 Mil/uL (ref 3.87–5.11)
RDW: 13.8 % (ref 11.5–15.5)
WBC: 7.4 10*3/uL (ref 4.0–10.5)

## 2022-11-10 LAB — TSH: TSH: 2.97 u[IU]/mL (ref 0.35–5.50)

## 2022-11-10 NOTE — Assessment & Plan Note (Signed)
Initially slightly elevated but at goal on recheck.  She will continue current regimen diltiazem 180 mg daily.

## 2022-11-10 NOTE — Assessment & Plan Note (Signed)
Also cardiology.  Anticoagulated on Eliquis and rate controlled on diltiazem.

## 2022-11-10 NOTE — Assessment & Plan Note (Signed)
Has seen pulmonology twice for this.  PFTs were reassuring.  She was diagnosed with deconditioning by the pulmonologist.  Working with cardiopulmonary rehab and has had significant improvement in symptoms.  She is currently taking a break due to her recent URI but will be starting back with rehab soon.

## 2022-11-10 NOTE — Assessment & Plan Note (Signed)
Check lipids 

## 2022-11-10 NOTE — Progress Notes (Signed)
   Nancy Mccormick is a 89 y.o. female who presents today for an office visit.  Assessment/Plan:  New/Acute Problems: URI Symptoms have resolved.  Chronic Problems Addressed Today: Dyspnea Has seen pulmonology twice for this.  PFTs were reassuring.  She was diagnosed with deconditioning by the pulmonologist.  Working with cardiopulmonary rehab and has had significant improvement in symptoms.  She is currently taking a break due to her recent URI but will be starting back with rehab soon.  Dyslipidemia (high LDL; low HDL) Check lipids.  Atrial fibrillation [facility]) Also cardiology.  Anticoagulated on Eliquis and rate controlled on diltiazem.  Essential hypertension Initially slightly elevated but at goal on recheck.  She will continue current regimen diltiazem 180 mg daily.     Subjective:  HPI:  See A/p for status of chronic conditions.    She did have a URI a few weeks ago. Saw a different provider here for this. Symptoms lingered for a few weeks but seem to be improving.  No shortness of breath.  No fevers or chills.       Objective:  Physical Exam: BP 138/70   Pulse 67   Temp 97.7 F (36.5 C) (Temporal)   Ht '5\' 4"'$  (1.626 m)   Wt 184 lb 6.4 oz (83.6 kg)   SpO2 96%   BMI 31.65 kg/m   Gen: No acute distress, resting comfortably CV: Regular rate and rhythm with no murmurs appreciated Pulm: Normal work of breathing, clear to auscultation bilaterally with no crackles, wheezes, or rhonchi Neuro: Grossly normal, moves all extremities Psych: Normal affect and thought content      Nancy Mccormick M. Jerline Pain, MD 11/10/2022 9:29 AM

## 2022-11-10 NOTE — Patient Instructions (Signed)
It was very nice to see you today!  We we will check blood work today.  I am glad that you are feeling better.  Please keep an eye on your blood pressure and let us know if it is persistently elevated.  We will see you back in 6 months.  Come back to see [place] sooner if needed.  Take care, Dr Jerline Pain  PLEASE NOTE:  If you had any lab tests, please let us know if you have not heard back within a few days. You may see your results on mychart before we have a chance to review them but we will give you a call once they are reviewed by [place].   If we ordered any referrals today, please let us know if you have not heard from their office within the next week.   If you had any urgent prescriptions sent in today, please check with the pharmacy within an hour of our visit to make sure the prescription was transmitted appropriately.   Please try these tips to maintain a healthy lifestyle:  Eat at least 3 REAL meals and 1-2 snacks per day.  Aim for no more than 5 hours between eating.  If you eat breakfast, please do so within one hour of getting up.   Each meal should contain half fruits/vegetables, one quarter protein, and one quarter carbs (no bigger than a computer mouse)  Cut down on sweet beverages. This includes juice, soda, and sweet tea.   Drink at least 1 glass of water with each meal and aim for at least 8 glasses per day  Exercise at least 150 minutes every week.

## 2022-11-13 NOTE — Progress Notes (Signed)
Please inform patient of the following:  Her cholesterol is up since last time.  Can we please verify that she has been taking her cholesterol medication.  If not please send in Crestor 5 mg daily.  We should recheck again in 6 months.  Her blood sugar is borderline elevated but stable compared to previous readings.  Do not need to make any medication changes for this however she should continue to work on diet and exercise and we can recheck in year.  Everything else is normal.

## 2022-11-19 ENCOUNTER — Other Ambulatory Visit: Payer: Self-pay

## 2022-11-20 ENCOUNTER — Other Ambulatory Visit: Payer: Self-pay

## 2022-11-20 MED ORDER — EZETIMIBE 10 MG PO TABS: 10.0000 mg | ORAL_TABLET | Freq: Every day | ORAL | 0 refills | Status: DC

## 2022-11-20 NOTE — Progress Notes (Signed)
Ok to send in zetia 15m daily. We should recheck in 6 months.

## 2023-01-01 ENCOUNTER — Other Ambulatory Visit: Payer: Self-pay | Admitting: Family Medicine

## 2023-01-01 ENCOUNTER — Ambulatory Visit: Payer: Medicare HMO

## 2023-01-01 NOTE — Progress Notes (Signed)
Pt stated visit was cancelled she will call back to reschedule at another time

## 2023-01-20 ENCOUNTER — Telehealth: Payer: Self-pay | Admitting: Family Medicine

## 2023-01-20 NOTE — Telephone Encounter (Signed)
Contacted Nancy Mccormick to schedule their annual wellness visit. Appointment made for 02/12/2023.  Gabriel Cirri [facility] TEAM Direct Dial [phone]

## 2023-01-24 ENCOUNTER — Other Ambulatory Visit: Payer: Self-pay | Admitting: Cardiology

## 2023-02-11 ENCOUNTER — Telehealth: Payer: Self-pay | Admitting: Family Medicine

## 2023-02-11 NOTE — Telephone Encounter (Signed)
Contacted Jocely Sorrento to schedule their annual wellness visit. Appointment made for 03/09/2023.  Gabriel Cirri [facility] TEAM Direct Dial [phone]

## 2023-03-09 ENCOUNTER — Ambulatory Visit (INDEPENDENT_AMBULATORY_CARE_PROVIDER_SITE_OTHER): Payer: Medicare HMO

## 2023-03-09 VITALS — Wt 184.0 lb

## 2023-03-09 DIAGNOSIS — Z0000 Encounter for general adult medical examination without abnormal findings: Secondary | ICD-10-CM

## 2023-03-09 NOTE — Patient Instructions (Signed)
Nancy Mccormick , Thank you for taking time to come for your Medicare Wellness Visit. I appreciate your ongoing commitment to your health goals. Please review the following plan we discussed and let me know if I can assist you in the future.   These are the goals we discussed:  Goals      Exercise 150 min/wk Moderate Activity     Patient Stated     Lose weight      Patient Stated     Just working toward feeling better     Patient Stated     Lose weight         This is a list of the screening recommended for you and due dates:  Health Maintenance  Topic Date Due   Mammogram  05/01/2023   Medicare Annual Wellness Visit  03/08/2024   DTaP/Tdap/Td vaccine (4 - Td or Tdap) 08/26/2028   Pneumonia Vaccine  Completed   DEXA scan (bone density measurement)  Completed   HPV Vaccine  Aged Out   Flu Shot  Discontinued   COVID-19 Vaccine  Discontinued   Zoster (Shingles) Vaccine  Discontinued    Advanced directives: Please bring a copy of your health care power of attorney and living will to the office at your convenience.   Conditions/risks identified: lose weight   Next appointment: Follow up in one year for your annual wellness visit    Preventive Care 65 Years and Older, Female Preventive care refers to lifestyle choices and visits with your health care provider that can promote health and wellness. What does preventive care include? A yearly physical exam. This is also called an annual well check. Dental exams once or twice a year. Routine eye exams. Ask your health care provider how often you should have your eyes checked. Personal lifestyle choices, including: Daily care of your teeth and gums. Regular physical activity. Eating a healthy diet. Avoiding tobacco and drug use. Limiting alcohol use. Practicing safe sex. Taking low-dose aspirin every day. Taking vitamin and mineral supplements as recommended by your health care provider. What happens during an annual well  check? The services and screenings done by your health care provider during your annual well check will depend on your age, overall health, lifestyle risk factors, and family history of disease. Counseling  Your health care provider may ask you questions about your: Alcohol use. Tobacco use. Drug use. Emotional well-being. Home and relationship well-being. Sexual activity. Eating habits. History of falls. Memory and ability to understand (cognition). Work and work Astronomer. Reproductive health. Screening  You may have the following tests or measurements: Height, weight, and BMI. Blood pressure. Lipid and cholesterol levels. These may be checked every 5 years, or more frequently if you are over 61 years old. Skin check. Lung cancer screening. You may have this screening every year starting at age 65 if you have a 30-pack-year history of smoking and currently smoke or have quit within the past 15 years. Fecal occult blood test (FOBT) of the stool. You may have this test every year starting at age 13. Flexible sigmoidoscopy or colonoscopy. You may have a sigmoidoscopy every 5 years or a colonoscopy every 10 years starting at age 75. Hepatitis C blood test. Hepatitis B blood test. Sexually transmitted disease (STD) testing. Diabetes screening. This is done by checking your blood sugar (glucose) after you have not eaten for a while (fasting). You may have this done every 1-3 years. Bone density scan. This is done to screen for osteoporosis.  You may have this done starting at age 12. Mammogram. This may be done every 1-2 years. Talk to your health care provider about how often you should have regular mammograms. Talk with your health care provider about your test results, treatment options, and if necessary, the need for more tests. Vaccines  Your health care provider may recommend certain vaccines, such as: Influenza vaccine. This is recommended every year. Tetanus, diphtheria, and  acellular pertussis (Tdap, Td) vaccine. You may need a Td booster every 10 years. Zoster vaccine. You may need this after age 24. Pneumococcal 13-valent conjugate (PCV13) vaccine. One dose is recommended after age 3. Pneumococcal polysaccharide (PPSV23) vaccine. One dose is recommended after age 34. Talk to your health care provider about which screenings and vaccines you need and how often you need them. This information is not intended to replace advice given to you by your health care provider. Make sure you discuss any questions you have with your health care provider. Document Released: 10/19/2015 Document Revised: 06/11/2016 Document Reviewed: 07/24/2015 Elsevier Interactive Patient Education  2017 [employer].  Fall Prevention in the Home Falls can cause injuries. They can happen to people of all ages. There are many things you can do to make your home safe and to help prevent falls. What can I do on the outside of my home? Regularly fix the edges of walkways and driveways and fix any cracks. Remove anything that might make you trip as you walk through a door, such as a raised step or threshold. Trim any bushes or trees on the path to your home. Use bright outdoor lighting. Clear any walking paths of anything that might make someone trip, such as rocks or tools. Regularly check to see if handrails are loose or broken. Make sure that both sides of any steps have handrails. Any raised decks and porches should have guardrails on the edges. Have any leaves, snow, or ice cleared regularly. Use sand or salt on walking paths during winter. Clean up any spills in your garage right away. This includes oil or grease spills. What can I do in the bathroom? Use night lights. Install grab bars by the toilet and in the tub and shower. Do not use towel bars as grab bars. Use non-skid mats or decals in the tub or shower. If you need to sit down in the shower, use a plastic, non-slip stool. Keep  the floor dry. Clean up any water that spills on the floor as soon as it happens. Remove soap buildup in the tub or shower regularly. Attach bath mats securely with double-sided non-slip rug tape. Do not have throw rugs and other things on the floor that can make you trip. What can I do in the bedroom? Use night lights. Make sure that you have a light by your bed that is easy to reach. Do not use any sheets or blankets that are too big for your bed. They should not hang down onto the floor. Have a firm chair that has side arms. You can use this for support while you get dressed. Do not have throw rugs and other things on the floor that can make you trip. What can I do in the kitchen? Clean up any spills right away. Avoid walking on wet floors. Keep items that you use a lot in easy-to-reach places. If you need to reach something above you, use a strong step stool that has a grab bar. Keep electrical cords out of the way. Do not use  floor polish or wax that makes floors slippery. If you must use wax, use non-skid floor wax. Do not have throw rugs and other things on the floor that can make you trip. What can I do with my stairs? Do not leave any items on the stairs. Make sure that there are handrails on both sides of the stairs and use them. Fix handrails that are broken or loose. Make sure that handrails are as long as the stairways. Check any carpeting to make sure that it is firmly attached to the stairs. Fix any carpet that is loose or worn. Avoid having throw rugs at the top or bottom of the stairs. If you do have throw rugs, attach them to the floor with carpet tape. Make sure that you have a light switch at the top of the stairs and the bottom of the stairs. If you do not have them, ask someone to add them for you. What else can I do to help prevent falls? Wear shoes that: Do not have high heels. Have rubber bottoms. Are comfortable and fit you well. Are closed at the toe. Do not  wear sandals. If you use a stepladder: Make sure that it is fully opened. Do not climb a closed stepladder. Make sure that both sides of the stepladder are locked into place. Ask someone to hold it for you, if possible. Clearly mark and make sure that you can see: Any grab bars or handrails. First and last steps. Where the edge of each step is. Use tools that help you move around (mobility aids) if they are needed. These include: Canes. Walkers. Scooters. Crutches. Turn on the lights when you go into a dark area. Replace any light bulbs as soon as they burn out. Set up your furniture so you have a clear path. Avoid moving your furniture around. If any of your floors are uneven, fix them. If there are any pets around you, be aware of where they are. Review your medicines with your doctor. Some medicines can make you feel dizzy. This can increase your chance of falling. Ask your doctor what other things that you can do to help prevent falls. This information is not intended to replace advice given to you by your health care provider. Make sure you discuss any questions you have with your health care provider. Document Released: 07/19/2009 Document Revised: 02/28/2016 Document Reviewed: 10/27/2014 Elsevier Interactive Patient Education  2017 [employer].

## 2023-03-09 NOTE — Progress Notes (Signed)
I connected with  Nancy Mccormick on 03/09/23 by a audio enabled telemedicine application and verified that I am speaking with the correct person using two identifiers.  Patient Location: Home  Provider Location: Office/Clinic  I discussed the limitations of evaluation and management by telemedicine. The patient expressed understanding and agreed to proceed.   Subjective:   Nancy Mccormick is a 89 y.o. female who presents for Medicare Annual (Subsequent) preventive examination.  Review of Systems     Cardiac Risk Factors include: advanced age (>66men, >38 women);dyslipidemia;hypertension;obesity (BMI >30kg/m2)     Objective:    Today's Vitals   03/09/23 1458  Weight: 184 lb (83.5 kg)   Body mass index is 31.58 kg/m.     03/09/2023    3:05 PM 12/23/2021    9:44 AM 05/10/2021    7:42 PM 12/17/2020    9:45 AM 07/16/2020    7:22 PM 11/14/2019   11:47 AM 08/26/2018    8:52 AM  Advanced Directives  Does Patient Have a Medical Advance Directive? Yes Yes No Yes Yes Yes Yes  Type of Estate agent of [place];Living will Healthcare Power of [employer] of [place];Living will Healthcare Power of Attorney Living will;Healthcare Power of [employer] Power of [place];Living will  Does patient want to make changes to medical advance directive?      No - Patient declined No - Patient declined  Copy of Healthcare Power of Attorney in Chart? No - copy requested No - copy requested  No - copy requested  No - copy requested Yes - validated most recent copy scanned in chart (See row information)  Would patient like information on creating a medical advance directive?     No - Patient declined      Current Medications (verified) Outpatient Encounter Medications as of 03/09/2023  Medication Sig   acetaminophen (TYLENOL) 500 MG tablet Take 1,000 mg by mouth every 6 (six) hours as needed for mild pain or moderate pain.   Calcium Carbonate-Vitamin D (CALCIUM +  D PO) Take 1 tablet by mouth at bedtime.   Cholecalciferol 50 MCG (2000 UT) TABS Take by mouth.   diltiazem (CARDIZEM CD) 180 MG 24 hr capsule TAKE 1 CAPSULE(180 MG) BY MOUTH DAILY   ELIQUIS 5 MG TABS tablet TAKE 1 TABLET(5 MG) BY MOUTH TWICE DAILY   ezetimibe (ZETIA) 10 MG tablet TAKE 1 TABLET(10 MG) BY MOUTH DAILY   fluticasone (FLONASE) 50 MCG/ACT nasal spray Place 2 sprays into both nostrils daily.   Multiple Vitamin (MULTIVITAMIN WITH MINERALS) TABS tablet Take 1 tablet by mouth at bedtime.   nitroGLYCERIN (NITROSTAT) 0.4 MG SL tablet Place 1 tablet (0.4 mg total) under the tongue as needed.   Saline (SIMPLY SALINE) 0.9 % AERS Place 2 each into the nose as directed. Use nightly for sinus hygiene long-term.  Can also be used as many times daily as desired to assist with clearing congested sinuses.   No facility-administered encounter medications on file as of 03/09/2023.    Allergies (verified) Shellfish allergy and Influenza vaccines   History: Past Medical History:  Diagnosis Date   Arthritis    back, left shoulder, and fingers   Atrial fibrillation (HCC)    History of chicken pox    Hyperlipidemia    Hypertension    Osteoporosis    Stroke [facility])    Past Surgical History:  Procedure Laterality Date   CATARACT EXTRACTION     KNEE ARTHROSCOPY Right 07/04/2015   Procedure: RIGHT  KNEE ARTHROSCOPY WITH MEDIAL MENISCAL DEBRIDEMENT AND CHONDROPLASTY;  Surgeon: Ollen Gross, MD;  Location: [facility];  Service: Orthopedics;  Laterality: Right;   OPEN REDUCTION INTERNAL FIXATION (ORIF) DISTAL RADIAL FRACTURE Right 12/28/2012   Procedure: OPEN REDUCTION INTERNAL FIXATION (ORIF) DISTAL RADIAL FRACTURE;  Surgeon: Velna Ochs, MD;  Location: [facility];  Service: Orthopedics;  Laterality: Right;   VAGINAL HYSTERECTOMY     Family History  Problem Relation Age of Onset   Stroke Mother 67   Anemia Mother    Heart disease Mother    Hearing loss Mother    Heart attack  Mother    Hypertension Mother    Stroke Father 53   Early death Father    Hypertension Father    Stroke Sister 24   Diabetes Sister    Hypertension Sister    Hypertension Son    Social History   Socioeconomic History   Marital status: Widowed    Spouse name: Not on file   Number of children: 3   Years of education: Not on file   Highest education level: Not on file  Occupational History   Occupation: Retired    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 She has three sons and three grandsons.None live locally but she gets to talk with them often and sees them pretty regularly. 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  She enjoys having lunch out with friends regularly. She plans to move to a   Social Determinants of Health   Financial Resource Strain: Low Risk  (03/09/2023)   Overall Financial Resource Strain (CARDIA)    Difficulty of Paying Living Expenses: Not hard at all  Food Insecurity: No Food Insecurity (03/09/2023)   Hunger Vital Sign    Worried About Running Out of Food in the Last Year: Never true    Ran Out of Food in the Last Year: Never true  Transportation Needs: No Transportation Needs (03/09/2023)   PRAPARE - Administrator, Civil Service (Medical): No    Lack of Transportation (Non-Medical):  No  Physical Activity: Sufficiently Active (03/09/2023)   Exercise Vital Sign    Days of Exercise per Week: 5 days    Minutes of Exercise per Session: 60 min  Stress: No Stress Concern Present (03/09/2023)   [employer] of Occupational Health - Occupational Stress Questionnaire    Feeling of Stress : Not at all  Social Connections: Moderately Integrated (03/09/2023)   Social Connection and Isolation Panel [NHANES]    Frequency of Communication with Friends and Family: More than three times a week    Frequency of Social Gatherings with Friends and Family: More than three times  a week    Attends Religious Services: More than 4 times per year    Active Member of Clubs or Organizations: Yes    Attends Banker Meetings: 1 to 4 times per year    Marital Status: Widowed    Tobacco Counseling Counseling given: Not Answered Tobacco comments: Smoke briefly in the distant past   Clinical Intake:  Pre-visit preparation completed: Yes  Pain : No/denies pain     BMI - recorded: 31.58 Nutritional Status: BMI > 30  Obese Nutritional Risks: None Diabetes: No  How often do you need to have someone help you when you read instructions, pamphlets, or other written materials from your doctor or pharmacy?: 1 - Never  Diabetic?no  Interpreter Needed?: No  Information entered by :: Lanier Ensign, LPN   Activities of Daily Living    03/09/2023    3:06 PM  In your present state of health, do you have any difficulty performing the following activities:  Hearing? 0  Vision? 0  Difficulty concentrating or making decisions? 0  Walking or climbing stairs? 0  Dressing or bathing? 0  Doing errands, shopping? 0  Preparing Food and eating ? N  Using the Toilet? N  In the past six months, have you accidently leaked urine? Y  Comment liners  Do you have problems with loss of bowel control? N  Managing your Medications? N  Managing your Finances? N  Housekeeping or managing your  Housekeeping? N    Patient Care Team: Ardith Dark, MD as PCP - General (Family Medicine) Rollene Rotunda, MD as PCP - Cardiology (Cardiology) Ollen Gross, MD as Consulting Physician (Orthopedic Surgery) Rollene Rotunda, MD as Consulting Physician (Cardiology)  Indicate any recent Medical Services you may have received from other than [facility] providers in the past year (date may be approximate).     Assessment:   This is a routine wellness examination for [place].  Hearing/Vision screen Hearing Screening - Comments:: Pt denies any hearing issues at this time  Vision Screening - Comments:: Pt follows up with Dr Dione Booze for annual eye exams   Dietary issues and exercise activities discussed: Current Exercise Habits: Home exercise routine, Type of exercise: walking, Time (Minutes): 60, Frequency (Times/Week): 5, Weekly Exercise (Minutes/Week): 300   Goals Addressed             This Visit's Progress    Patient Stated       Lose weight        Depression Screen    03/09/2023    3:03 PM 11/10/2022    9:10 AM 05/08/2022    9:32 AM 04/21/2022    9:49 AM 12/23/2021    9:43 AM 05/07/2021    1:23 PM 12/17/2020    9:43 AM  PHQ 2/9 Scores  PHQ - 2 Score 0 0 0 0 0 0 0  PHQ- 9 Score    2       Fall Risk    03/09/2023    3:05 PM 11/10/2022    9:10 AM 05/08/2022    9:32 AM 04/21/2022    9:55 AM 12/23/2021    9:45 AM  Fall Risk   Falls in the past year? 0 0 0  0  Number falls in past yr: 0 0 0 0 0  Injury with Fall? 0 0 0 0 0  Risk for fall due to : Impaired vision No Fall Risks No Fall Risks History of fall(s);Impaired balance/gait;Impaired mobility;Impaired vision Impaired vision  Follow up  Falls prevention discussed   Falls evaluation completed;Education provided;Falls prevention discussed Falls prevention discussed    FALL RISK PREVENTION PERTAINING TO THE HOME:  Any stairs in or around the home? Yes  If so, are there any without handrails? No  Home free of loose throw rugs in  walkways, pet beds, electrical cords, etc? Yes  Adequate lighting in your home to reduce risk of falls? Yes   ASSISTIVE DEVICES UTILIZED TO PREVENT FALLS:  Life alert? Yes  Use of a cane, walker or w/c? No  Grab bars in the bathroom? Yes  Shower chair or bench in shower? No  Elevated toilet seat or a handicapped toilet? Yes   TIMED UP AND GO:  Was the test performed? No .   Cognitive Function:    08/31/2017   10:00 AM 08/25/2017    9:09 AM 02/20/2015    3:24 PM 02/20/2015    2:43 PM  MMSE - Mini Mental State Exam  Not completed:    Refused  Orientation to time 5 5 4    Orientation to Place 5 5 5    Registration 3 3 3    Attention/ Calculation 5 5 5    Recall 3 3 3    Language- name 2 objects 1 2 2    Language- repeat 1 1 1    Language- follow 3 step command 3 3 3    Language- read & follow direction 1 1 1    Write a sentence 1 1 1    Copy design 1 1 1    Total score 29 30 29          03/09/2023    3:06 PM 12/23/2021    9:47 AM 12/17/2020    9:50 AM 11/14/2019   11:48 AM 08/26/2018    9:08 AM  6CIT Screen  What Year? 0 points 0 points 0 points 0 points 0 points  What month? 0 points 0 points 0 points 0 points 0 points  What time? 0 points 0 points  0 points 0 points  Count back from 20 0 points 0 points 0 points 0 points 0 points  Months in reverse 0 points 0 points 0 points 0 points 0 points  Repeat phrase 0 points 0 points 0 points 0 points 0 points  Total Score 0 points 0 points  0 points 0 points    Immunizations Immunization History  Administered Date(s) Administered   PFIZER(Purple Top)SARS-COV-2 Vaccination 10/26/2019, 11/16/2019, 08/14/2020, 05/01/2021   Pneumococcal Conjugate-13 12/08/2013   Pneumococcal Polysaccharide-23 11/11/2016   Td 06/02/2011   Tdap 06/02/2011, 08/26/2018   Zoster, Live 04/15/2012    TDAP status: Up to date  Flu Vaccine status: Declined, Education has been provided regarding the importance of this vaccine but patient still declined.  Advised may receive this vaccine at local pharmacy or Health Dept. Aware to provide a copy of the vaccination record if obtained from local pharmacy or Health Dept. Verbalized acceptance and understanding.  Pneumococcal vaccine status: Up to date  Covid-19 vaccine status: Declined, Education has been provided regarding the importance of this vaccine but patient still declined. Advised may receive this vaccine at local pharmacy or Health Dept.or vaccine clinic. Aware to provide a copy of the vaccination record if obtained from local pharmacy or Health Dept. Verbalized acceptance and understanding.  Qualifies for Shingles Vaccine? No    Screening Tests Health Maintenance  Topic Date Due   MAMMOGRAM  05/01/2023   Medicare Annual Wellness (AWV)  03/08/2024   DTaP/Tdap/Td (4 - Td or Tdap) 08/26/2028  Pneumonia Vaccine 56+ Years old  Completed   DEXA SCAN  Completed   HPV VACCINES  Aged Out   INFLUENZA VACCINE  Discontinued   COVID-19 Vaccine  Discontinued   Zoster Vaccines- Shingrix  Discontinued    Health Maintenance  There are no preventive care reminders to display for this patient.   Colorectal cancer screening: No longer required.   Mammogram status: Completed 04/30/22. Repeat every year  Bone Density status: Completed 11/17/16. Results reflect: Bone density results: OSTEOPENIA. Repeat every 2 years.   Additional Screening:   Vision Screening: Recommended annual ophthalmology exams for early detection of glaucoma and other disorders of the eye. Is the patient up to date with their annual eye exam?  Yes  Who is the provider or what is the name of the office in which the patient attends annual eye exams? Dr Dione Booze  If pt is not established with a provider, would they like to be referred to a provider to establish care? No .   Dental Screening: Recommended annual dental exams for proper oral hygiene  Community Resource Referral / Chronic Care Management: CRR required this  visit?  No   CCM required this visit?  No      Plan:     I have personally reviewed and noted the following in the patient's chart:   Medical and social history Use of alcohol, tobacco or illicit drugs  Current medications and supplements including opioid prescriptions. Patient is not currently taking opioid prescriptions. Functional ability and status Nutritional status Physical activity Advanced directives List of other physicians Hospitalizations, surgeries, and ER visits in previous 12 months Vitals Screenings to include cognitive, depression, and falls Referrals and appointments  In addition, I have reviewed and discussed with patient certain preventive protocols, quality metrics, and best practice recommendations. A written personalized care plan for preventive services as well as general preventive health recommendations were provided to patient.     Marzella Schlein, LPN   10/11/1094   Nurse Notes: none

## 2023-03-11 ENCOUNTER — Other Ambulatory Visit: Payer: Self-pay | Admitting: Family Medicine

## 2023-04-18 ENCOUNTER — Other Ambulatory Visit: Payer: Self-pay | Admitting: Family Medicine

## 2023-04-22 ENCOUNTER — Encounter: Payer: Self-pay | Admitting: Cardiology

## 2023-04-22 ENCOUNTER — Other Ambulatory Visit: Payer: Self-pay | Admitting: Family Medicine

## 2023-04-22 DIAGNOSIS — Z1231 Encounter for screening mammogram for malignant neoplasm of breast: Secondary | ICD-10-CM

## 2023-05-02 ENCOUNTER — Other Ambulatory Visit: Payer: Self-pay | Admitting: Family Medicine

## 2023-05-04 ENCOUNTER — Telehealth: Payer: Self-pay | Admitting: Family Medicine

## 2023-05-04 NOTE — Telephone Encounter (Signed)
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 Translation No Disp. Time Lamount Cohen Time) Disposition Final User 05/04/2023 8:26:39 AM Attempt made - message left Lily Kocher RN, Ricki Rodriguez 05/04/2023 8:39:10 AM Attempt made - message left Maryelizabeth Rowan, Ricki Rodriguez 05/04/2023 8:55:35 AM FINAL ATTEMPT MADE - message left Yes Lily Kocher RN, Adriana Final Disposition 05/04/2023 8:55:35 AM FINAL ATTEMPT MADE - message left

## 2023-05-04 NOTE — Telephone Encounter (Signed)
Patient returned call.  Transferred to Triage.

## 2023-05-04 NOTE — Telephone Encounter (Signed)
FYI: This call has been transferred to triage nurse: the Triage Nurse. Once the result note has been entered staff can address the message at that time.  Patient called in with the following symptoms:  Red Word:elevated blood pressure 05/04/23 am 168/82   /    05/03/23 pm 190/90 (dizziness)   Please advise at Mobile [phone] (mobile)  Message is routed to Provider Pool.

## 2023-05-04 NOTE — Telephone Encounter (Signed)
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 Yesterday her blood pressure was 190/90 and she was dizzy and this morning it was 168/82. Her doctor name is Dr. Jimmey Ralph. Translation No Nurse Assessment Nurse: Lily Kocher, RN, Adriana Date/Time (Eastern Time): 05/04/2023 9:24:26 AM Confirm and document reason for call. If symptomatic, describe symptoms. ---pt states she has been having issues with bp being elevated. this morning it was 168/82. yesterday 190/90 with dizziness. pt is on diltiazem (180mg ) daily. Does the patient have any new or worsening symptoms? ---Yes Will a triage be completed? ---Yes Related visit to physician within the last 2 weeks? ---No Does the PT have any chronic conditions? (i.e. diabetes, asthma, this includes High risk factors for pregnancy, etc.) ---Yes List chronic conditions. ---afib htn mild stroke high cholesterol Is this a behavioral health or substance abuse call? ---No Guidelines Guideline Title Affirmed Question Affirmed Notes Nurse Date/Time (Eastern Time) Blood Pressure - High Systolic BP >= 160 OR Diastolic >= 100 Lily Kocher, RN, Adriana 05/04/2023 9:27:40 AM Disp. Time Lamount Cohen Time) Disposition Final User 05/04/2023 9:29:14 AM  SEE PCP WITHIN 3 DAYS Yes Lily Kocher RN, Ricki Rodriguez  Final Disposition 05/04/2023 9:29:14 AM SEE PCP WITHIN 3 DAYS Yes Lily Kocher, RN, Ricki Rodriguez Caller Disagree/Comply Comply Caller Understands Yes PreDisposition Call Doctor Care Advice Given Per Guideline SEE PCP WITHIN 3 DAYS: * You need to be seen within 2 or 3 days. * PCP VISIT: Call your doctor (or NP/PA) during regular office hours and make an appointment. A clinic or urgent care center are good places to go for care if your doctor's office is closed or you can't get an appointment. NOTE: If office will be open tomorrow, tell caller to call then, not in 3 days. CALL BACK IF: * Weakness or numbness of the face, arm or leg on one side of the body occurs * Difficulty walking, difficulty talking, or severe headache occurs * Chest pain or difficulty breathing occurs * Your blood pressure is over 180/110 * You become worse CARE ADVICE given per High Blood Pressure (Adult) guideline.

## 2023-05-04 NOTE — Telephone Encounter (Signed)
See note

## 2023-05-05 ENCOUNTER — Encounter: Payer: Self-pay | Admitting: Family Medicine

## 2023-05-05 ENCOUNTER — Ambulatory Visit: Payer: Medicare HMO | Admitting: Family Medicine

## 2023-05-05 VITALS — BP 140/80 | HR 70 | Temp 97.5°F | Resp 18 | Ht 64.0 in | Wt 187.1 lb

## 2023-05-05 DIAGNOSIS — I10 Essential (primary) hypertension: Secondary | ICD-10-CM

## 2023-05-05 MED ORDER — DILTIAZEM HCL ER 60 MG PO CP12: 60.0000 mg | ORAL_CAPSULE | Freq: Two times a day (BID) | ORAL | 5 refills | Status: DC | PRN

## 2023-05-05 NOTE — Progress Notes (Signed)
   Nancy Mccormick is a 89 y.o. female who presents today for an office visit.  Assessment/Plan:  Chronic Problems Addressed Today: Essential hypertension Blood pressure modestly elevated today though much better than it has been over the last couple of weeks.  Unclear etiology for recent fluctuating blood pressure readings however is reassuring that her blood pressure is now normalizing.  She is not currently having any symptoms though did have some symptoms with her severely elevated readings within the last 1 to 2 weeks.  She has been compliant with her diltiazem 180 mg daily.  We did discuss making adjustments to her blood pressure regimen.  She was previously on 240 mg diltiazem daily however this was changed by her cardiologist.We will send in an extra 60 mg tablet to use twice daily as needed for blood pressure readings above 150/90.  If she needs to use this consistently will probably go back to 240 mg daily.  She does have appointment with cardiology in a couple of months as well.  We discussed reasons to return to care and seek emergent care.     Subjective:  HPI:  See Assessment / plan for status of chronic conditions.  Main concern today is blood pressure.  This is been fluctuating for the last couple of weeks.  His blood pressure has been 190/90.  When elevated she does feel dizziness and flushed.  She has been monitoring daily and it has been raning in the 150's-160's over 70s. No recent illnesses. She has been consistent with her medications. No missed doses. No recent changes to diet or exercise level.  She currently feels fine.  No headache.  No dizziness.  No chest pain or shortness of breath.       Objective:  Physical Exam: BP (!) 140/80 (BP Location: Left Arm, Patient Position: Sitting, Cuff Size: Large)   Pulse 70   Temp (!) 97.5 F (36.4 C) (Temporal)   Resp 18   Ht 5\' 4"  (1.626 m)   Wt 187 lb 2 oz (84.9 kg)   SpO2 94%   BMI 32.12 kg/m   Gen: No acute  distress, resting comfortably CV: Regular rate and rhythm with no murmurs appreciated Pulm: Normal work of breathing, clear to auscultation bilaterally with no crackles, wheezes, or rhonchi Neuro: Grossly normal, moves all extremities Psych: Normal affect and thought content      Eliese Kerwood M. Jimmey Ralph, MD 05/05/2023 2:50 PM

## 2023-05-05 NOTE — Patient Instructions (Addendum)
It was very nice to see you today!  I will send in a 60 mg tablet of diltiazem.  Please use this if your blood pressure is elevated to 150/90 or higher.  Send [place] message in a few weeks to let us know how you are doing.  Return if symptoms worsen or fail to improve.   Take care, Dr Jimmey Ralph  PLEASE NOTE:  If you had any lab tests, please let us know if you have not heard back within a few days. You may see your results on mychart before we have a chance to review them but we will give you a call once they are reviewed by [place].   If we ordered any referrals today, please let us know if you have not heard from their office within the next week.   If you had any urgent prescriptions sent in today, please check with the pharmacy within an hour of our visit to make sure the prescription was transmitted appropriately.   Please try these tips to maintain a healthy lifestyle:  Eat at least 3 REAL meals and 1-2 snacks per day.  Aim for no more than 5 hours between eating.  If you eat breakfast, please do so within one hour of getting up.   Each meal should contain half fruits/vegetables, one quarter protein, and one quarter carbs (no bigger than a computer mouse)  Cut down on sweet beverages. This includes juice, soda, and sweet tea.   Drink at least 1 glass of water with each meal and aim for at least 8 glasses per day  Exercise at least 150 minutes every week.

## 2023-05-05 NOTE — Assessment & Plan Note (Signed)
Blood pressure modestly elevated today though much better than it has been over the last couple of weeks.  Unclear etiology for recent fluctuating blood pressure readings however is reassuring that her blood pressure is now normalizing.  She is not currently having any symptoms though did have some symptoms with her severely elevated readings within the last 1 to 2 weeks.  She has been compliant with her diltiazem 180 mg daily.  We did discuss making adjustments to her blood pressure regimen.  She was previously on 240 mg diltiazem daily however this was changed by her cardiologist.We will send in an extra 60 mg tablet to use twice daily as needed for blood pressure readings above 150/90.  If she needs to use this consistently will probably go back to 240 mg daily.  She does have appointment with cardiology in a couple of months as well.  We discussed reasons to return to care and seek emergent care.

## 2023-05-06 ENCOUNTER — Ambulatory Visit
Admission: RE | Admit: 2023-05-06 | Discharge: 2023-05-06 | Disposition: A | Discharge status: Home / Self Care | Payer: Medicare HMO | Source: Ambulatory Visit | Attending: Family Medicine | Admitting: Family Medicine

## 2023-05-06 DIAGNOSIS — Z1231 Encounter for screening mammogram for malignant neoplasm of breast: Secondary | ICD-10-CM

## 2023-05-11 ENCOUNTER — Encounter: Payer: Self-pay | Admitting: Family Medicine

## 2023-05-11 ENCOUNTER — Ambulatory Visit: Payer: Medicare HMO | Admitting: Family Medicine

## 2023-05-11 VITALS — BP 148/72 | HR 69 | Temp 97.7°F | Ht 64.0 in | Wt 186.6 lb

## 2023-05-11 DIAGNOSIS — I10 Essential (primary) hypertension: Secondary | ICD-10-CM

## 2023-05-11 DIAGNOSIS — R739 Hyperglycemia, unspecified: Secondary | ICD-10-CM

## 2023-05-11 LAB — POCT GLYCOSYLATED HEMOGLOBIN (HGB A1C): Hemoglobin A1C: 5.7 % — AB (ref 4.0–5.6)

## 2023-05-11 NOTE — Progress Notes (Signed)
   Nancy Mccormick is a 89 y.o. female who presents today for an office visit.  Assessment/Plan:  Chronic Problems Addressed Today: Essential hypertension Blood pressure modestly elevated today though it has been much better over the last week and was about a month ago or so.  She will continue with diltiazem 180 mg daily.  She does have an extra 60 mg to use as needed for severe elevations.  She is only had to use this once over the last week.  Usually her home readings have been at goal.  She will continue to monitor at home and follow-up with [place] in a few weeks.  She also does have an appointment with cardiology in a couple months as well.  We discussed reasons return to care or seek emergent care.  Hyperglycemia A1c improved to 5.7.  Discussed lifestyle modifications.  Recheck in 1 year.  She will come back in 1 year for CPE.    Subjective:  HPI:  See Assessment / plan for status of chronic conditions.  She is doing well today.  No acute concerns.  We saw her last week for blood pressure.  She is taken 1 extra dose of diltiazem.  Is doing well.       Objective:  Physical Exam: BP (!) 148/72   Pulse 69   Temp 97.7 F (36.5 C) (Temporal)   Ht 5\' 4"  (1.626 m)   Wt 186 lb 9.6 oz (84.6 kg)   SpO2 95%   BMI 32.03 kg/m   Gen: No acute distress, resting comfortably CV: Regular rate and rhythm with no murmurs appreciated Pulm: Normal work of breathing, clear to auscultation bilaterally with no crackles, wheezes, or rhonchi Neuro: Grossly normal, moves all extremities Psych: Normal affect and thought content      Lorenia Hoston M. Jimmey Ralph, MD 05/11/2023 10:09 AM

## 2023-05-11 NOTE — Patient Instructions (Signed)
It was very nice to see you today!  Your A1c looks great today.  Please continue to work on diet and exercise.  Return in about 1 year (around 05/10/2024) for Annual Physical.   Take care, Dr Jimmey Ralph  PLEASE NOTE:  If you had any lab tests, please let us know if you have not heard back within a few days. You may see your results on mychart before we have a chance to review them but we will give you a call once they are reviewed by [place].   If we ordered any referrals today, please let us know if you have not heard from their office within the next week.   If you had any urgent prescriptions sent in today, please check with the pharmacy within an hour of our visit to make sure the prescription was transmitted appropriately.   Please try these tips to maintain a healthy lifestyle:  Eat at least 3 REAL meals and 1-2 snacks per day.  Aim for no more than 5 hours between eating.  If you eat breakfast, please do so within one hour of getting up.   Each meal should contain half fruits/vegetables, one quarter protein, and one quarter carbs (no bigger than a computer mouse)  Cut down on sweet beverages. This includes juice, soda, and sweet tea.   Drink at least 1 glass of water with each meal and aim for at least 8 glasses per day  Exercise at least 150 minutes every week.

## 2023-05-11 NOTE — Assessment & Plan Note (Signed)
A1c improved to 5.7.  Discussed lifestyle modifications.  Recheck in 1 year.

## 2023-05-11 NOTE — Assessment & Plan Note (Signed)
Blood pressure modestly elevated today though it has been much better over the last week and was about a month ago or so.  She will continue with diltiazem 180 mg daily.  She does have an extra 60 mg to use as needed for severe elevations.  She is only had to use this once over the last week.  Usually her home readings have been at goal.  She will continue to monitor at home and follow-up with [place] in a few weeks.  She also does have an appointment with cardiology in a couple months as well.  We discussed reasons return to care or seek emergent care.

## 2023-06-01 ENCOUNTER — Ambulatory Visit: Payer: Medicare HMO | Admitting: Cardiology

## 2023-06-23 ENCOUNTER — Other Ambulatory Visit: Payer: Self-pay | Admitting: Family Medicine

## 2023-07-29 ENCOUNTER — Telehealth: Payer: Self-pay | Admitting: Family Medicine

## 2023-07-29 NOTE — Telephone Encounter (Signed)
Pt would like a Do Not Resuscitate to be filled out and she will come pick it up. Please call pt back when ready.

## 2023-07-29 NOTE — Telephone Encounter (Signed)
Please advise 

## 2023-07-29 NOTE — Telephone Encounter (Signed)
We should have the forms here. Ok to fill out for patient.  Nancy Mccormick. Jimmey Ralph, MD 07/29/2023 9:46 AM

## 2023-07-30 NOTE — Progress Notes (Signed)
Cardiology Office Note:   Date:  07/31/2023  ID:  Nancy Mccormick, DOB 12/01/1933, MRN [id number] PCP: Ardith Dark, MD  [facility] Providers Cardiologist:  Rollene Rotunda, MD {  History of Present Illness:   Nancy Mccormick is a 89 y.o. female who presents for followup of atrial fibrillation she was admitted in Nov 2018 with a small MCA infarct and was in atrial fib.  She was in the hospital in August of 2019.  She had atrial fib with RVR which converted spontaneously to NSR.     She was in the emergency room in March 2023 with shortness of breath.    There was no clear cardiac etiology.  She followed up with continued SOB and profound fatigue.  She has had an extensive work up to include echo with only mild AI.  There was a normal BNP.  She had a negative perfusion study and has had lab work with no anemia and a normal TSH.  In the ED CXR was unremarkable.  There was no PE on CT.  She had a head CT and had no acute findings.   Since I last saw her she has done very well.  She completed pulmonary rehab and her breathing got much better.  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  She does walking.  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  She has 2 great grandchildren. The patient denies any new symptoms such as chest discomfort, neck or arm discomfort. There has been no new shortness of breath, PND or orthopnea. There have been no reported palpitations, presyncope or syncope.     ROS: As stated in the HPI and negative for all other systems.  Studies Reviewed:    EKG:   EKG Interpretation Date/Time:  Friday July 31 2023 08:01:50 EDT Ventricular Rate:  69 PR Interval:  162 QRS Duration:  84 QT Interval:  428 QTC Calculation: 458 R Axis:   -5  Text Interpretation: Sinus rhythm with Premature atrial complexes Low voltage QRS When compared with ECG of 01-Jan-2022 17:22, Premature atrial complexes are now Present Confirmed by Rollene Rotunda ([id number])  on 07/31/2023 8:02:51 AM     Risk Assessment/Calculations:    CHA2DS2-VASc Score = 4   This indicates a 4.8% annual risk of stroke. The patient's score is based upon: CHF History: 0 HTN History: 1 Diabetes History: 0 Stroke History: 0 Vascular Disease History: 0 Age Score: 2 Gender Score: 1   Physical Exam:   VS:  BP (!) 152/70   Ht 5\' 4"  (1.626 m)   Wt 186 lb 3.2 oz (84.5 kg)   BMI 31.96 kg/m    Wt Readings from Last 3 Encounters:  07/31/23 186 lb 3.2 oz (84.5 kg)  05/11/23 186 lb 9.6 oz (84.6 kg)  05/05/23 187 lb 2 oz (84.9 kg)     GEN: Well nourished, well developed in no acute distress NECK: No JVD; No carotid bruits CARDIAC: RRR,  murmurs, rubs, gallops RESPIRATORY:  Clear to auscultation without rales, wheezing or rhonchi  ABDOMEN: Soft, non-tender, non-distended EXTREMITIES:  No edema; No deformity   ASSESSMENT AND PLAN:   ATRIAL FIBRILLATION:      [name] has a CHA2DS2 - VASc score of 6.  She tolerates anticoagulation.  No change in therapy.  She has not had any symptomatic paroxysms.  HTN:   The blood pressure is elevated but she thinks this is unusual.  She is going to  keep a blood pressure diary and she likely will need adjustments to her medications.  DYSLIPIDEMIA:    LDL is 146 but she has been intolerant of statins.  She was tolerating Zetia given to her by her primary doctor but she has been out of this.  I will renew this prescription and have her follow-up with her lipids with Ardith Dark, MD         Follow up with me in one year.   Signed, Rollene Rotunda, MD

## 2023-07-31 ENCOUNTER — Ambulatory Visit: Payer: Medicare HMO | Attending: Cardiology | Admitting: Cardiology

## 2023-07-31 ENCOUNTER — Other Ambulatory Visit (HOSPITAL_COMMUNITY): Payer: Self-pay

## 2023-07-31 ENCOUNTER — Encounter: Payer: Self-pay | Admitting: Cardiology

## 2023-07-31 VITALS — BP 150/60 | Ht 64.0 in | Wt 186.2 lb

## 2023-07-31 DIAGNOSIS — I480 Paroxysmal atrial fibrillation: Secondary | ICD-10-CM

## 2023-07-31 DIAGNOSIS — I10 Essential (primary) hypertension: Secondary | ICD-10-CM | POA: Diagnosis not present

## 2023-07-31 DIAGNOSIS — E785 Hyperlipidemia, unspecified: Secondary | ICD-10-CM | POA: Diagnosis not present

## 2023-07-31 MED ORDER — EZETIMIBE 10 MG PO TABS: 10.0000 mg | ORAL_TABLET | Freq: Every day | ORAL | 3 refills | Status: DC

## 2023-07-31 MED ORDER — EZETIMIBE 10 MG PO TABS
10.0000 mg | ORAL_TABLET | Freq: Every day | ORAL | 3 refills | Status: DC
  Filled 2023-07-31: qty 90, 90d supply, fill #0

## 2023-07-31 NOTE — Telephone Encounter (Signed)
LVM please come to our office to fill up form  If any question give [place] a call

## 2023-07-31 NOTE — Patient Instructions (Signed)
Medication Instructions:  Start Zetia 10 mg once daily. Script sent *If you need a refill on your cardiac medications before your next appointment, please call your pharmacy*   Follow-Up: At [facility], you and your health needs are our priority.  As part of our continuing mission to provide you with exceptional heart care, we have created designated Provider Care Teams.  These Care Teams include your primary Cardiologist (physician) and Advanced Practice Providers (APPs -  Physician Assistants and Nurse Practitioners) who all work together to provide you with the care you need, when you need it.  We recommend signing up for the patient portal called "MyChart".  Sign up information is provided on this After Visit Summary.  MyChart is used to connect with patients for Virtual Visits (Telemedicine).  Patients are able to view lab/test results, encounter notes, upcoming appointments, etc.  Non-urgent messages can be sent to your provider as well.   To learn more about what you can do with MyChart, go to [email or web address].    Your next appointment:   12 month(s)  Provider:   Rollene Rotunda, MD   Other Instructions Keep Blood pressure log daily on forms provided.

## 2023-08-01 ENCOUNTER — Other Ambulatory Visit: Payer: Self-pay | Admitting: Cardiology

## 2023-08-05 ENCOUNTER — Other Ambulatory Visit: Payer: Self-pay | Admitting: Family Medicine

## 2023-09-09 ENCOUNTER — Encounter: Payer: Self-pay | Admitting: Podiatry

## 2023-09-09 ENCOUNTER — Ambulatory Visit: Payer: Medicare HMO | Admitting: Podiatry

## 2023-09-09 DIAGNOSIS — B351 Tinea unguium: Secondary | ICD-10-CM

## 2023-09-09 NOTE — Addendum Note (Signed)
Addended by: Daryel November on: 09/09/2023 04:35 PM   Modules accepted: Orders

## 2023-09-09 NOTE — Progress Notes (Signed)
  Subjective:  Patient ID: Nancy Mccormick, female    DOB: [birth year],   MRN: [id number]  No chief complaint on file.   89 y.o. female presents for concern of bilateral great toe pain that has been ongoing for a while. Relates she has had thickened discolored nails for years but recently started getting more pain in her toenails. Relates she gets regular pedicures monthly  . Denies any other pedal complaints. Denies n/v/f/c.   Past Medical History:  Diagnosis Date   Arthritis    back, left shoulder, and fingers   Atrial fibrillation (HCC)    History of chicken pox    Hyperlipidemia    Hypertension    Osteoporosis    Stroke (HCC)     Objective:  Physical Exam: Vascular: DP/PT pulses 2/4 bilateral. CFT <3 seconds. Normal hair growth on digits. No edema.  Skin. No lacerations or abrasions bilateral feet. Bilateral hallux nails thickened and elongated with discoloration and subungual debris noted Musculoskeletal: MMT 5/5 bilateral lower extremities in DF, PF, Inversion and Eversion. Deceased ROM in DF of ankle joint.  Neurological: Sensation intact to light touch.   Assessment:   1. Onychomycosis      Plan:  Patient was evaluated and treated and all questions answered. -Examined patient -Discussed treatment options for painful dystrophic nails  -Clinical picture and Fungal culture was obtained by removing a portion of the hard nail itself from each of the involved toenails using a sterile nail nipper and sent to [facility] lab. Patient tolerated the biopsy procedure well without discomfort or need for anesthesia.  -Discussed fungal nail treatment options including oral, topical, and laser treatments.  -Patient to return in 4 weeks for follow up evaluation and discussion of fungal culture results or sooner if symptoms worsen.   Louann Sjogren, DPM

## 2023-09-16 ENCOUNTER — Other Ambulatory Visit: Payer: Self-pay | Admitting: Podiatry

## 2023-10-12 ENCOUNTER — Ambulatory Visit: Payer: Medicare HMO | Admitting: Podiatry

## 2023-10-26 ENCOUNTER — Ambulatory Visit: Payer: Medicare HMO | Admitting: Podiatry

## 2023-10-26 ENCOUNTER — Encounter: Payer: Self-pay | Admitting: Podiatry

## 2023-10-26 DIAGNOSIS — B351 Tinea unguium: Secondary | ICD-10-CM | POA: Diagnosis not present

## 2023-10-26 NOTE — Progress Notes (Signed)
  Subjective:  Patient ID: Nancy Mccormick, female    DOB: 02-13-1934,   MRN: [id number]  No chief complaint on file.   89 y.o. female presents for follow-up of fungal nails and to review culture results.   . Denies any other pedal complaints. Denies n/v/f/c.   Past Medical History:  Diagnosis Date   Arthritis    back, left shoulder, and fingers   Atrial fibrillation (HCC)    History of chicken pox    Hyperlipidemia    Hypertension    Osteoporosis    Stroke (HCC)     Objective:  Physical Exam: Vascular: DP/PT pulses 2/4 bilateral. CFT <3 seconds. Normal hair growth on digits. No edema.  Skin. No lacerations or abrasions bilateral feet. Bilateral hallux nails thickened and elongated with discoloration and subungual debris noted Musculoskeletal: MMT 5/5 bilateral lower extremities in DF, PF, Inversion and Eversion. Deceased ROM in DF of ankle joint.  Neurological: Sensation intact to light touch.   Assessment:   1. Onychomycosis       Plan:  Patient was evaluated and treated and all questions answered. -Examined patient -Discussed treatment options for painful dystrophic nails  -Cultuure positive for fungus  -Discussed fungal nail treatment options including oral, topical, and laser treatments.  -Patient elects to proceed with laser treatments and will get her scheduled.  -Patient to return in 4 months for recheck of nails   Louann Sjogren, DPM

## 2023-12-25 ENCOUNTER — Ambulatory Visit (INDEPENDENT_AMBULATORY_CARE_PROVIDER_SITE_OTHER): Payer: Medicare HMO

## 2023-12-25 DIAGNOSIS — B351 Tinea unguium: Secondary | ICD-10-CM

## 2023-12-25 NOTE — Patient Instructions (Signed)
 Follow-up care is important to the success of your foot laser treatment. Please keep these instructions for future reference.    Normal activity can resume immediately.   2. IF you doctor prescribed an antifungal cream apply medication to the skin on the bottom of your feet, sides of your feet, between your toes, and around your nails. (This cream is not intended for use on your nails) Apply cream as directed  3. IF a topical for your nails was prescribed by your doctor, apply to nail/nails once daily until nail is free from infection unless otherwise directed by your doctor. Maximum use for nail topical is 12 months.   4. Spray the insides of your shoes with an over the counter antifungal spray or Lysol disinfectant (aerosol can) at the end of each day or use an ultra violet shoe sterilizer as directed. Try not to wear the same shoes everyday.   5. Buy new nail clippers and files since your current pair may be infected. Metal nail care instruments may also be cleaned with diluted bleach or boiling water. DO NOT share nail clippers or nail files.  6. Keep your toenails trimmed and clean.   7. Wear flip-flops in public places especially hotel rooms and showers, athletic club locker rooms and showers and indoor swimming pools.   8. Avoid nail salons that do not clean their instruments properly or use a whirlpool system.

## 2023-12-25 NOTE — Progress Notes (Signed)
 Patient presents today for the 1st laser treatment. Diagnosed with mycotic nail infection by Dr. Ralene Cork.   Toenail most affected 1-5 bilaterally.  All other systems are negative.  Nails were filed thin and trimmed. Laser therapy was administered to 1-5 toenails bilaterally and patient tolerated the treatment well. All safety precautions were in place.     Follow up in 4 weeks for laser # 2.

## 2023-12-30 ENCOUNTER — Ambulatory Visit: Payer: Medicare HMO | Admitting: Podiatry

## 2024-02-12 ENCOUNTER — Ambulatory Visit (INDEPENDENT_AMBULATORY_CARE_PROVIDER_SITE_OTHER): Admitting: *Deleted

## 2024-02-12 DIAGNOSIS — B351 Tinea unguium: Secondary | ICD-10-CM

## 2024-02-12 NOTE — Progress Notes (Signed)
 Patient presents today for the 2nd laser treatment. Diagnosed with mycotic nail infection by Dr. Sikora.   Toenail most affected hallux bilateral   All other systems are negative.  All nails were trimmed and filed. Laser therapy was administered to 1-5 toenails bilaterally and patient tolerated the treatment well. All safety precautions were in place.   She does have an ingrown toenail medial border of the left hallux. I trimmed for her today. I advised she may need to get in to see Dr. Sikora to have that checked. She did say that it had been very sore.    Follow up in 4 weeks for laser # 3 and also with Dr. Sikora for the ingrown toenail as soon as possible.

## 2024-03-09 ENCOUNTER — Ambulatory Visit

## 2024-03-09 VITALS — Ht 64.0 in | Wt 186.0 lb

## 2024-03-09 DIAGNOSIS — Z0000 Encounter for general adult medical examination without abnormal findings: Secondary | ICD-10-CM

## 2024-03-09 NOTE — Patient Instructions (Signed)
 Ms. Woody , Thank you for taking time out of your busy schedule to complete your Annual Wellness Visit with me. I enjoyed our conversation and look forward to speaking with you again next year. I, as well as your care team,  appreciate your ongoing commitment to your health goals. Please review the following plan we discussed and let me know if I can assist you in the future. Your Game plan/ To Do List    Referrals: If you haven't heard from the office you've been referred to, please reach out to them at the phone provided.  None Follow up Visits: Next Medicare AWV with our clinical staff: 03/13/25 @ 9:20   Have you seen your provider in the last 6 months (3 months if uncontrolled diabetes)? No Next Office Visit with your provider: 05/12/24  Clinician Recommendations:  Aim for 30 minutes of exercise or brisk walking, 6-8 glasses of water, and 5 servings of fruits and vegetables each day.       This is a list of the screening recommended for you and due dates:  Health Maintenance  Topic Date Due   Mammogram  05/05/2024   Medicare Annual Wellness Visit  03/09/2025   DTaP/Tdap/Td vaccine (4 - Td or Tdap) 08/26/2028   Pneumonia Vaccine  Completed   DEXA scan (bone density measurement)  Completed   HPV Vaccine  Aged Out   Meningitis B Vaccine  Aged Out   Flu Shot  Discontinued   COVID-19 Vaccine  Discontinued   Zoster (Shingles) Vaccine  Discontinued    Advanced directives: (In Chart) A copy of your advanced directives are scanned into your chart should your provider ever need it. Advance Care Planning is important because it:  [x]  Makes sure you receive the medical care that is consistent with your values, goals, and preferences  [x]  It provides guidance to your family and loved ones and reduces their decisional burden about whether or not they are making the right decisions based on your wishes.

## 2024-03-09 NOTE — Progress Notes (Signed)
 Subjective:   Nancy Mccormick is a 89 y.o. who presents for a Medicare Wellness preventive visit.  As a reminder, Annual Wellness Visits don't include a physical exam, and some assessments may be limited, especially if this visit is performed virtually. We may recommend an in-person follow-up visit with your provider if needed.  Visit Complete: Virtual I connected with  [name] on 03/09/24 by a audio enabled telemedicine application and verified that I am speaking with the correct person using two identifiers.  Patient Location: Home  Provider Location: Home Office  I discussed the limitations of evaluation and management by telemedicine. The patient expressed understanding and agreed to proceed.  Vital Signs: Because this visit was a virtual/telehealth visit, some criteria may be missing or patient reported. Any vitals not documented were not able to be obtained and vitals that have been documented are patient reported.  VideoDeclined- This patient declined Librarian, academic. Therefore the visit was completed with audio only.  Persons Participating in Visit: Patient.  AWV Questionnaire: No: Patient Medicare AWV questionnaire was not completed prior to this visit.  Cardiac Risk Factors include: advanced age (>96men, >63 women);dyslipidemia;hypertension;obesity (BMI >30kg/m2);Other (see comment), Risk factor comments: AFib     Objective:     Today's Vitals   03/09/24 1522  Weight: 186 lb (84.4 kg)  Height: 5\' 4"  (1.626 m)   Body mass index is 31.93 kg/m.     03/09/2024    3:32 PM 03/09/2023    3:05 PM 12/23/2021    9:44 AM 05/10/2021    7:42 PM 12/17/2020    9:45 AM 07/16/2020    7:22 PM 11/14/2019   11:47 AM  Advanced Directives  Does Patient Have a Medical Advance Directive? Yes Yes Yes No Yes Yes Yes  Type of Estate agent of [place];Living will Healthcare Power of [place];Living will Healthcare Power of [employer] of [place];Living will Healthcare Power of Attorney Living will;Healthcare Power of Attorney  Does patient want to make changes to medical advance directive? No - Patient declined      No - Patient declined  Copy of Healthcare Power of Attorney in Chart? Yes - validated most recent copy scanned in chart (See row information) No - copy requested No - copy requested  No - copy requested  No - copy requested  Would patient like information on creating a medical advance directive?      No - Patient declined     Current Medications (verified) Outpatient Encounter Medications as of 03/09/2024  Medication Sig   acetaminophen  (TYLENOL ) 500 MG tablet Take 1,000 mg by mouth every 6 (six) hours as needed for mild pain or moderate pain.   Calcium  Carbonate-Vitamin D  (CALCIUM  + D PO) Take 1 tablet by mouth at bedtime.   Cholecalciferol 50 MCG (2000 UT) TABS Take by mouth.   diltiazem  (CARDIZEM  CD) 180 MG 24 hr capsule TAKE 1 CAPSULE(180 MG) BY MOUTH DAILY   diltiazem  (CARDIZEM  SR) 60 MG 12 hr capsule Take 1 capsule (60 mg total) by mouth 2 (two) times daily as needed (BP > 160/90).   ELIQUIS  5 MG TABS tablet TAKE 1 TABLET(5 MG) BY MOUTH TWICE DAILY   Multiple Vitamin (MULTIVITAMIN WITH MINERALS) TABS tablet Take 1 tablet by mouth at bedtime.   nitroGLYCERIN  (NITROSTAT ) 0.4 MG SL tablet Place 1 tablet (0.4 mg total) under the tongue as needed.   Saline (SIMPLY SALINE) 0.9 % AERS Place 2 each into the nose as  directed. Use nightly for sinus hygiene long-term.  Can also be used as many times daily as desired to assist with clearing congested sinuses.   ezetimibe  (ZETIA ) 10 MG tablet Take 1 tablet (10 mg total) by mouth daily.   No facility-administered encounter medications on file as of 03/09/2024.    Allergies (verified) Shellfish allergy and Influenza vaccines   History: Past Medical History:  Diagnosis Date   Arthritis    back, left shoulder, and fingers   Atrial fibrillation (HCC)     History of chicken pox    Hyperlipidemia    Hypertension    Osteoporosis    Stroke [facility])    Past Surgical History:  Procedure Laterality Date   CATARACT EXTRACTION     KNEE ARTHROSCOPY Right 07/04/2015   Procedure: RIGHT KNEE ARTHROSCOPY WITH MEDIAL MENISCAL DEBRIDEMENT AND CHONDROPLASTY;  Surgeon: Liliane Rei, MD;  Location: [facility];  Service: Orthopedics;  Laterality: Right;   OPEN REDUCTION INTERNAL FIXATION (ORIF) DISTAL RADIAL FRACTURE Right 12/28/2012   Procedure: OPEN REDUCTION INTERNAL FIXATION (ORIF) DISTAL RADIAL FRACTURE;  Surgeon: Alphonzo Ask, MD;  Location: [facility];  Service: Orthopedics;  Laterality: Right;   VAGINAL HYSTERECTOMY     Family History  Problem Relation Age of Onset   Stroke Mother 41   Anemia Mother    Heart disease Mother    Hearing loss Mother    Heart attack Mother    Hypertension Mother    Stroke Father 60   Early death Father    Hypertension Father    Stroke Sister 43   Diabetes Sister    Hypertension Sister    Hypertension Son    Social History   Socioeconomic History   Marital status: Widowed    Spouse name: Not on file   Number of children: 3   Years of education: Not on file   Highest education level: Not on file  Occupational History   Occupation: Retired    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 She has three sons and three grandsons.None live locally but she gets to talk with them often and sees them  pretty regularly. 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  She enjoys having lunch out with friends regularly. She plans to move to a   Social Drivers of Corporate investment banker Strain: Low Risk  (03/09/2024)   Overall Financial Resource Strain (CARDIA)    Difficulty of Paying Living Expenses: Not hard at all  Food Insecurity: No Food Insecurity (03/09/2024)   Hunger Vital Sign    Worried About Running Out of Food in the Last Year: Never true  Ran Out of Food in the Last Year: Never true  Transportation Needs: No Transportation Needs (03/09/2024)   PRAPARE - Administrator, Civil Service (Medical): No    Lack of Transportation (Non-Medical): No  Physical Activity: Insufficiently Active (03/09/2024)   Exercise Vital Sign    Days of Exercise per Week: 4 days    Minutes of Exercise per Session: 30 min  Stress: No Stress Concern Present (03/09/2024)   [employer] of Occupational Health - Occupational Stress Questionnaire    Feeling of Stress : Not at all  Social Connections: Moderately Integrated (03/09/2024)   Social Connection and Isolation Panel [NHANES]    Frequency of Communication with Friends and Family: More than three times a week    Frequency of Social Gatherings with Friends and Family: More than three times a week    Attends Religious Services: More than 4 times per year    Active Member of [employer] or Organizations: Yes    Attends Banker Meetings: More than 4 times per year    Marital Status: Widowed    Tobacco Counseling Counseling given: Not Answered Tobacco comments: Smoke briefly in the distant past    Clinical Intake:  Pre-visit preparation completed: Yes  Pain : No/denies pain     BMI - recorded: 31.93 Nutritional Status: BMI > 30   Obese Nutritional Risks: None Diabetes: No  Lab Results  Component Value Date   HGBA1C 5.7 (A) 05/11/2023   HGBA1C 6.1 11/10/2022   HGBA1C 5.9 (H) 08/31/2017     How often do you need to have someone help you when you read instructions, pamphlets, or other written materials from your doctor or pharmacy?: 1 - Never  Interpreter Needed?: No  Information entered by :: R. Susano Cleckler LPN   Activities of Daily Living     03/09/2024    3:23 PM  In your present state of health, do you have any difficulty performing the following activities:  Hearing? 0  Comment some loss  Vision? 0  Comment glasses  Difficulty concentrating or making decisions? 1  Walking or climbing stairs? 1  Dressing or bathing? 0  Doing errands, shopping? 0  Preparing Food and eating ? N  Using the Toilet? N  In the past six months, have you accidently leaked urine? Y  Do you have problems with loss of bowel control? N  Managing your Medications? N  Managing your Finances? N  Housekeeping or managing your Housekeeping? N    Patient Care Team: Rodney Clamp, MD as PCP - General (Family Medicine) Eilleen Grates, MD as PCP - Cardiology (Cardiology) Liliane Rei, MD as Consulting Physician (Orthopedic Surgery) Eilleen Grates, MD as Consulting Physician (Cardiology)  I have updated your Care Teams any recent Medical Services you may have received from other providers in the past year.     Assessment:    This is a routine wellness examination for Malone.  Hearing/Vision screen Hearing Screening - Comments:: Some loss Vision Screening - Comments:: Glasses   Goals Addressed             This Visit's Progress    Patient Stated       Wants to eat more vegetables and get more protein and stay active       Depression Screen     03/09/2024    3:27 PM 05/11/2023    9:23 AM 05/05/2023    2:23 PM 03/09/2023    3:03 PM 11/10/2022    9:10  AM 05/08/2022    9:32 AM 04/21/2022    9:49 AM  PHQ 2/9 Scores  PHQ  - 2 Score 0 0 0 0 0 0 0  PHQ- 9 Score 1 2 5    2     Fall Risk     03/09/2024    3:25 PM 05/11/2023    9:23 AM 05/05/2023    2:23 PM 03/09/2023    3:05 PM 11/10/2022    9:10 AM  Fall Risk   Falls in the past year? 0 0 0 0 0  Number falls in past yr: 0 0 0 0 0  Injury with Fall? 0 0 0 0 0  Risk for fall due to : No Fall Risks No Fall Risks No Fall Risks Impaired vision No Fall Risks  Follow up Falls evaluation completed;Falls prevention discussed  Falls evaluation completed Falls prevention discussed     MEDICARE RISK AT HOME:  Medicare Risk at Home Any stairs in or around the home?: Yes If so, are there any without handrails?: No Home free of loose throw rugs in walkways, pet beds, electrical cords, etc?: Yes Adequate lighting in your home to reduce risk of falls?: Yes Life alert?: No Use of a cane, walker or w/c?: No Grab bars in the bathroom?: Yes Shower chair or bench in shower?: Yes Elevated toilet seat or a handicapped toilet?: No  TIMED UP AND GO:  Was the test performed?  No  Cognitive Function: 6CIT completed    08/31/2017   10:00 AM 08/25/2017    9:09 AM 02/20/2015    3:24 PM 02/20/2015    2:43 PM  MMSE - Mini Mental State Exam  Not completed:    Refused  Orientation to time 5 5 4    Orientation to Place 5 5 5    Registration 3 3 3    Attention/ Calculation 5 5 5    Recall 3 3 3    Language- name 2 objects 1 2 2    Language- repeat 1 1 1    Language- follow 3 step command 3 3 3    Language- read & follow direction 1 1 1    Write a sentence 1 1 1    Copy design 1 1 1    Total score 29 30 29          03/09/2024    3:32 PM 03/09/2023    3:06 PM 12/23/2021    9:47 AM 12/17/2020    9:50 AM 11/14/2019   11:48 AM  6CIT Screen  What Year? 0 points 0 points 0 points 0 points 0 points  What month? 0 points 0 points 0 points 0 points 0 points  What time? 0 points 0 points 0 points  0 points  Count back from 20 0 points 0 points 0 points 0 points 0 points  Months in reverse 0  points 0 points 0 points 0 points 0 points  Repeat phrase 0 points 0 points 0 points 0 points 0 points  Total Score 0 points 0 points 0 points  0 points    Immunizations Immunization History  Administered Date(s) Administered   PFIZER(Purple Top)SARS-COV-2 Vaccination 10/26/2019, 11/16/2019, 08/14/2020, 05/01/2021   Pneumococcal Conjugate-13 12/08/2013   Pneumococcal Polysaccharide-23 11/11/2016   Td 06/02/2011   Tdap 06/02/2011, 08/26/2018   Zoster, Live 04/15/2012    Screening Tests Health Maintenance  Topic Date Due   Medicare Annual Wellness (AWV)  03/08/2024   MAMMOGRAM  05/05/2024   DTaP/Tdap/Td (4 - Td or Tdap) 08/26/2028   Pneumonia Vaccine 65+  Years old  Completed   DEXA SCAN  Completed   HPV VACCINES  Aged Out   Meningococcal B Vaccine  Aged Out   INFLUENZA VACCINE  Discontinued   COVID-19 Vaccine  Discontinued   Zoster Vaccines- Shingrix  Discontinued    Health Maintenance  Health Maintenance Due  Topic Date Due   Medicare Annual Wellness (AWV)  03/08/2024   Health Maintenance Items Addressed: Patient unable to take flu shot because allergic reaction to it in the past.   Additional Screening:  Vision Screening: Recommended annual ophthalmology exams for early detection of glaucoma and other disorders of the eye. Up to date [facility] Would you like a referral to an eye doctor? No    Dental Screening: Recommended annual dental exams for proper oral hygiene  Community Resource Referral / Chronic Care Management: CRR required this visit?  No   CCM required this visit?  No   Plan:    I have personally reviewed and noted the following in the patient's chart:   Medical and social history Use of alcohol, tobacco or illicit drugs  Current medications and supplements including opioid prescriptions. Patient is not currently taking opioid prescriptions. Functional ability and status Nutritional status Physical activity Advanced directives List of other  physicians Hospitalizations, surgeries, and ER visits in previous 12 months Vitals Screenings to include cognitive, depression, and falls Referrals and appointments  In addition, I have reviewed and discussed with patient certain preventive protocols, quality metrics, and best practice recommendations. A written personalized care plan for preventive services as well as general preventive health recommendations were provided to patient.   Felicitas Horse, LPN   4/0/9811   After Visit Summary: (MyChart) Due to this being a telephonic visit, the after visit summary with patients personalized plan was offered to patient via MyChart   Notes: Nothing significant to report at this time.

## 2024-03-14 ENCOUNTER — Ambulatory Visit: Admitting: Podiatry

## 2024-03-14 ENCOUNTER — Encounter: Payer: Self-pay | Admitting: Podiatry

## 2024-03-14 DIAGNOSIS — M79676 Pain in unspecified toe(s): Secondary | ICD-10-CM | POA: Diagnosis not present

## 2024-03-14 DIAGNOSIS — B351 Tinea unguium: Secondary | ICD-10-CM

## 2024-03-14 DIAGNOSIS — L600 Ingrowing nail: Secondary | ICD-10-CM | POA: Diagnosis not present

## 2024-03-14 NOTE — Progress Notes (Signed)
 Subjective:  Patient ID: Nancy Mccormick, female    DOB: 1934/02/04,   MRN: [id number]  Chief Complaint  Patient presents with   Ingrown Toenail    Rm21/ Ingrown right hallux lateral border/eliquis /not diabetic    89 y.o. female presents for follow-up of fungal nails and new concern of ingrown nail on her right great toenail that has been painful. Hoping to have it taken care of today.   . Denies any other pedal complaints. Denies n/v/f/c.   Past Medical History:  Diagnosis Date   Arthritis    back, left shoulder, and fingers   Atrial fibrillation (HCC)    History of chicken pox    Hyperlipidemia    Hypertension    Osteoporosis    Stroke (HCC)     Objective:  Physical Exam: Vascular: DP/PT pulses 2/4 bilateral. CFT <3 seconds. Normal hair growth on digits. No edema.  Skin. No lacerations or abrasions bilateral feet. Bilateral hallux nails thickened and elongated with discoloration and subungual debris noted. Left hallux medial border incurvated and tender to palpation. No erythema edema or purulence noted.  Musculoskeletal: MMT 5/5 bilateral lower extremities in DF, PF, Inversion and Eversion. Deceased ROM in DF of ankle joint.  Neurological: Sensation intact to light touch.   Assessment:   1. Ingrown left greater toenail   2. Onychomycosis        Plan:  Patient was evaluated and treated and all questions answered. -Examined patient -Discussed treatment options for painful dystrophic nails  -Cultuure positive for fungus  -Discussed fungal nail treatment options including oral, topical, and laser treatments.  -Will continue with one or two more laser treatments. Does appear to be improving.  Discussed ingrown toenails etiology and treatment options including procedure for removal vs conservative care.  Patient on eliquis  and discussed risks  Patient requesting removal of ingrown nail today. Procedure below.  Discussed procedure and post procedure care and patient  expressed understanding.  Will follow-up in 2 weeks for nail check or sooner if any problems arise.    Procedure:  Procedure: partial Nail Avulsion of left hallux medial  nail border.  Surgeon: Jennefer Moats, DPM  Pre-op Dx: Ingrown toenail without infection Post-op: Same  Place of Surgery: Office exam room.  Indications for surgery: Painful and ingrown toenail.    The patient is requesting removal of nail with  chemical matrixectomy. Risks and complications were discussed with the patient for which they understand and written consent was obtained. Under sterile conditions a total of 3 mL of  1% lidocaine  plain was infiltrated in a hallux block fashion. Once anesthetized, the skin was prepped in sterile fashion. A tourniquet was then applied. Next the medial aspect of hallux nail border was then sharply excised making sure to remove the entire offending nail border.  Next phenol was then applied under standard conditions to permanently destroy the matrix and copiously irrigated. Silvadene was applied. A dry sterile dressing was applied. After application of the dressing the tourniquet was removed and there is found to be an immediate capillary refill time to the digit. The patient tolerated the procedure well without any complications. Post procedure instructions were discussed the patient for which he verbally understood. Follow-up in two weeks for nail check or sooner if any problems are to arise. Discussed signs/symptoms of infection and directed to call the office immediately should any occur or go directly to the emergency room. In the meantime, encouraged to call the office with any questions, concerns, changes symptoms.  Jennefer Moats, DPM

## 2024-03-14 NOTE — Patient Instructions (Signed)

## 2024-03-28 ENCOUNTER — Ambulatory Visit: Admitting: Podiatry

## 2024-03-28 ENCOUNTER — Encounter: Payer: Self-pay | Admitting: Podiatry

## 2024-03-28 DIAGNOSIS — L600 Ingrowing nail: Secondary | ICD-10-CM

## 2024-03-28 NOTE — Progress Notes (Signed)
  Subjective:  Patient ID: Nancy Mccormick, female    DOB: [birth year],   MRN: [id number]  Chief Complaint  Patient presents with   Ingrown Toenail    Nail check - hallux left   Its done really good    89 y.o. female presents for follow-up of ingrown nail procedure. Relates doing well and soaking as instructed.  Denies any other pedal complaints. Denies n/v/f/c.   Past Medical History:  Diagnosis Date   Arthritis    back, left shoulder, and fingers   Atrial fibrillation (HCC)    History of chicken pox    Hyperlipidemia    Hypertension    Osteoporosis    Stroke (HCC)     Objective:  Physical Exam: Vascular: DP/PT pulses 2/4 bilateral. CFT <3 seconds. Normal hair growth on digits. No edema.  Skin. No lacerations or abrasions bilateral feet. Bilateral hallux nails thickened and elongated with discoloration and subungual debris noted improved appearance. Left hallux healing well.  Musculoskeletal: MMT 5/5 bilateral lower extremities in DF, PF, Inversion and Eversion. Deceased ROM in DF of ankle joint.  Neurological: Sensation intact to light touch.   Assessment:   1. Ingrown left greater toenail        Plan:  Patient was evaluated and treated and all questions answered. Toe was evaluated and appears to be healing well.  May discontinue soaks and neosporin.  Patient to follow-up as needed.      Asberry Failing, DPM

## 2024-04-01 ENCOUNTER — Ambulatory Visit (INDEPENDENT_AMBULATORY_CARE_PROVIDER_SITE_OTHER): Admitting: *Deleted

## 2024-04-01 DIAGNOSIS — B351 Tinea unguium: Secondary | ICD-10-CM

## 2024-04-01 NOTE — Progress Notes (Addendum)
 Patient presents today for the 3rd laser treatment. Diagnosed with mycotic nail infection by Dr. Sikora.   Toenail most affected hallux bilateral   All other systems are negative.  All nails were trimmed and filed. Laser therapy was administered to 1-5 toenails bilaterally and patient tolerated the treatment well. All safety precautions were in place.   Patient did have ingrown toenail treated with Dr. Sikora on 03/14/2024.   Follow up in 6 weeks for laser # 4.   Patient will get one more treatment per Dr. Sikora and will have completed recommended laser treatments.

## 2024-04-15 ENCOUNTER — Telehealth: Payer: Self-pay | Admitting: Cardiology

## 2024-04-15 ENCOUNTER — Ambulatory Visit: Payer: Self-pay

## 2024-04-15 DIAGNOSIS — I480 Paroxysmal atrial fibrillation: Secondary | ICD-10-CM

## 2024-04-15 DIAGNOSIS — R002 Palpitations: Secondary | ICD-10-CM

## 2024-04-15 NOTE — Telephone Encounter (Signed)
 Lavona Agent, MD to [facility] Triage  (Selected Message)     04/15/24  2:18 PM Please send a two week Zio for evaluation of palpitations.    Called the patient to let her know about Dr Hochrein's recommendation. No answer, left call back number.

## 2024-04-15 NOTE — Telephone Encounter (Signed)
 Patient c/o Palpitations:  STAT if patient reporting lightheadedness, shortness of breath, or chest pain  How long have you had palpitations/irregular HR/ Afib? Are you having the symptoms now? Last night  Are you currently experiencing lightheadedness, SOB or CP? Soreness in chest  Do you have a history of afib (atrial fibrillation) or irregular heart rhythm? Yes  Have you checked your BP or HR? (document readings if available): 114/77 - Last night  Are you experiencing any other symptoms? No

## 2024-04-15 NOTE — Telephone Encounter (Signed)
Noted. Agree with dispo. 

## 2024-04-15 NOTE — Telephone Encounter (Signed)
 Pt reports that her heart was beating really fast last night for about 30-min to an hour- her bp 114/77; She used her [employer]- and it said no Afib  Got up this morning and it said possible Afib  Did it about 30 min later and it said no Afib- just possible arrhythmia.   No chest pain- She explains that there was some soreness in her chest earlier this morning due to heart beating so fast? Not so sore anymore.   Pt reports that her heart is not beating irregularly now. She felt a little dizziness at the time and now just feels tired. She has not eaten anything this morning either.  She has not had any problems with her heart rate in about 4 years. This just came on all of the sudden last night. Now it is better.   Made a f/u appt with Dr Lavona 06/02/24. Informed that I would ask Dr Lavona if he would like to make any medication changes before the appointment. Given ER precautions. She verbalized understanding.   (She has diltazem 60mg  bid prn- but it is just for increased BP- not irregular heart beat, and her bp was not increased, so she did not take it)

## 2024-04-15 NOTE — Telephone Encounter (Signed)
 FYI Only or Action Required?: FYI only for provider.  Patient was last seen in primary care on 05/11/2023 by Kennyth Worth HERO, MD.  Called Nurse Triage reporting Fatigue, Shortness of Breath, and Atrial Fibrillation.  Symptoms began yesterday.  Interventions attempted: Nothing.  Symptoms are: intermittent heavy breathing/mild SOB, generalized weakness, abnormal heart beat unchanged.  Triage Disposition: Go to ED Now (Notify PCP)  Patient/caregiver understands and will follow disposition?: Unsure          Copied from CRM [id number]. Topic: Clinical - Red Word Triage >> Apr 15, 2024  8:08 AM Mercedes MATSU wrote: Red Word that prompted transfer to Nurse Triage: Patient states that she has a cardiac device and last night it read no afib, this morning patient was weak and took it again and it said possible afib. She said she took it again and it said no afib. Patient wants to know why. Reason for Disposition  Difficulty breathing  Answer Assessment - Initial Assessment Questions Last night around 0930-10pm states her heart felt like it was beating hard last night. She states this morning she felt weak. She was using her [employer]ia Mobile and it has been reading no afib and then about 45 minutes ago it said possible afib. Patient states more cardiac symptoms, switching to cardiac protocol.  1. RESPIRATORY STATUS: Describe your breathing? (e.g., wheezing, shortness of breath, unable to speak, severe coughing)      Heavy to breath, she states she thinks it is from excitement.  2. ONSET: When did this breathing problem begin?      This morning.  3. PATTERN Does the difficult breathing come and go, or has it been constant since it started?      *No Answer*  4. SEVERITY: How bad is your breathing? (e.g., mild, moderate, severe)      *No Answer*  5. RECURRENT SYMPTOM: Have you had difficulty breathing before? If Yes, ask: When was the last time? and What happened that time?       *No Answer*  6. CARDIAC HISTORY: Do you have any history of heart disease? (e.g., heart attack, angina, bypass surgery, angioplasty)      *No Answer* 7. LUNG HISTORY: Do you have any history of lung disease?  (e.g., pulmonary embolus, asthma, emphysema)     *No Answer*  8. CAUSE: What do you think is causing the breathing problem?      *No Answer*  9. OTHER SYMPTOMS: Do you have any other symptoms? (e.g., chest pain, cough, dizziness, fever, runny nose)     Fatigue/weakness,  10. O2 SATURATION MONITOR:  Do you use an oxygen  saturation monitor (pulse oximeter) at home? If Yes, ask: What is your reading (oxygen  level) today? What is your usual oxygen  saturation reading? (e.g., 95%)       *No Answer* 11. PREGNANCY: Is there any chance you are pregnant? When was your last menstrual period?       *No Answer* 12. TRAVEL: Have you traveled out of the country in the last month? (e.g., travel history, exposures)       *No Answer*  Answer Assessment - Initial Assessment Questions Last night around 0930-10pm states her heart felt like it was beating hard last night. She states this morning she felt weak. She was using her Cardia Mobile and it has been reading no afib and then about 45 minutes ago it said possible afib   1. DESCRIPTION: Please describe your heart rate or heartbeat that you are having (e.g., fast/slow, regular/irregular,  skipped or extra beats, palpitations)     Heavy heart beat. Very rapid  2. ONSET: When did it start? (e.g., minutes, hours, days)      0930-10pm last night.  3. DURATION: How long does it last (e.g., seconds, minutes, hours)     1 hour.  4. PATTERN Does it come and go, or has it been constant since it started?  Does it get worse with exertion?   Are you feeling it now?     Comes and goes. Patient states she doesn't feel it now, but states she does feel weak.  5. TAP: Using your hand, can you tap out what you are  feeling on a chair or table in front of you, so that I can hear? Note: Not all patients can do this.       N/A.  6. HEART RATE: Can you tell me your heart rate? How many beats in 15 seconds?  Note: Not all patients can do this.       Patient checked it on cardia mobile and states it was not tachycardia but did say arrhythmia.  7. RECURRENT SYMPTOM: Have you ever had this before? If Yes, ask: When was the last time? and What happened that time?      Yes, she states she has had to go to the ER about 5-6 years ago and get on an IV drip to take her out of a fib.  8. CAUSE: What do you think is causing the palpitations?     She states this feels like her a fib.  9. CARDIAC HISTORY: Do you have any history of heart disease? (e.g., heart attack, angina, bypass surgery, angioplasty, arrhythmia)      A fib, aortic valve regurgitation.  10. OTHER SYMPTOMS: Do you have any other symptoms? (e.g., dizziness, chest pain, sweating, difficulty breathing)       Generalized weakness, mild SOB heavy breathing, earlier felt unsteady on my feet. Patient denies any chest pain.  11. PREGNANCY: Is there any chance you are pregnant? When was your last menstrual period?       N/A.  Protocols used: Breathing Difficulty-A-AH, Heart Rate and Heartbeat Questions-A-AH

## 2024-04-19 ENCOUNTER — Ambulatory Visit: Attending: Cardiology

## 2024-04-19 DIAGNOSIS — R002 Palpitations: Secondary | ICD-10-CM

## 2024-04-19 DIAGNOSIS — I480 Paroxysmal atrial fibrillation: Secondary | ICD-10-CM

## 2024-04-19 NOTE — Telephone Encounter (Signed)
 Spoke with pt regarding Dr. Denver suggestion to do a zio heart monitor. Pt agreeable. Instructions will be sent via letter in the mail per pt's request. Pt verbalized understanding. All questions if any were answered.

## 2024-04-19 NOTE — Progress Notes (Unsigned)
 Enrolled for [employer] to mail a ZIO XT long term holter monitor to the patients address on file.   Requested delivery on 04/26/24.

## 2024-05-12 ENCOUNTER — Encounter: Payer: Medicare HMO | Admitting: Family Medicine

## 2024-05-13 ENCOUNTER — Encounter (HOSPITAL_COMMUNITY): Payer: Self-pay

## 2024-05-13 ENCOUNTER — Ambulatory Visit: Payer: Self-pay

## 2024-05-13 ENCOUNTER — Emergency Department (HOSPITAL_COMMUNITY)

## 2024-05-13 ENCOUNTER — Other Ambulatory Visit: Payer: Self-pay

## 2024-05-13 ENCOUNTER — Emergency Department (HOSPITAL_COMMUNITY)
Admission: EM | Admit: 2024-05-13 | Discharge: 2024-05-13 | Disposition: A | Discharge status: Home / Self Care | Attending: Emergency Medicine | Admitting: Emergency Medicine

## 2024-05-13 DIAGNOSIS — E785 Hyperlipidemia, unspecified: Secondary | ICD-10-CM

## 2024-05-13 DIAGNOSIS — I479 Paroxysmal tachycardia, unspecified: Secondary | ICD-10-CM

## 2024-05-13 DIAGNOSIS — I480 Paroxysmal atrial fibrillation: Secondary | ICD-10-CM

## 2024-05-13 DIAGNOSIS — Z7901 Long term (current) use of anticoagulants: Secondary | ICD-10-CM | POA: Insufficient documentation

## 2024-05-13 DIAGNOSIS — I471 Supraventricular tachycardia, unspecified: Secondary | ICD-10-CM | POA: Diagnosis not present

## 2024-05-13 DIAGNOSIS — I700 Atherosclerosis of aorta: Secondary | ICD-10-CM

## 2024-05-13 DIAGNOSIS — I10 Essential (primary) hypertension: Secondary | ICD-10-CM | POA: Diagnosis not present

## 2024-05-13 DIAGNOSIS — R0602 Shortness of breath: Secondary | ICD-10-CM | POA: Diagnosis present

## 2024-05-13 LAB — CBC WITH DIFFERENTIAL/PLATELET
Abs Immature Granulocytes: 0.02 K/uL (ref 0.00–0.07)
Basophils Absolute: 0 K/uL (ref 0.0–0.1)
Basophils Relative: 0 %
Eosinophils Absolute: 0.2 K/uL (ref 0.0–0.5)
Eosinophils Relative: 2 %
HCT: 41.8 % (ref 36.0–46.0)
Hemoglobin: 13.4 g/dL (ref 12.0–15.0)
Immature Granulocytes: 0 %
Lymphocytes Relative: 27 %
Lymphs Abs: 2.7 K/uL (ref 0.7–4.0)
MCH: 29.7 pg (ref 26.0–34.0)
MCHC: 32.1 g/dL (ref 30.0–36.0)
MCV: 92.7 fL (ref 80.0–100.0)
Monocytes Absolute: 1 K/uL (ref 0.1–1.0)
Monocytes Relative: 10 %
Neutro Abs: 6.1 K/uL (ref 1.7–7.7)
Neutrophils Relative %: 61 %
Platelets: 336 K/uL (ref 150–400)
RBC: 4.51 MIL/uL (ref 3.87–5.11)
RDW: 13.5 % (ref 11.5–15.5)
WBC: 10.1 K/uL (ref 4.0–10.5)
nRBC: 0 % (ref 0.0–0.2)

## 2024-05-13 LAB — BASIC METABOLIC PANEL WITH GFR
Anion gap: 11 (ref 5–15)
BUN: 15 mg/dL (ref 8–23)
CO2: 24 mmol/L (ref 22–32)
Calcium: 8.9 mg/dL (ref 8.9–10.3)
Chloride: 108 mmol/L (ref 98–111)
Creatinine, Ser: 0.71 mg/dL (ref 0.44–1.00)
GFR, Estimated: >60 mL/min (ref >60)
Glucose, Bld: 99 mg/dL (ref 70–99)
Potassium: 4 mmol/L (ref 3.5–5.1)
Sodium: 143 mmol/L (ref 135–145)

## 2024-05-13 LAB — TROPONIN I (HIGH SENSITIVITY)
Troponin I (High Sensitivity): 7 ng/L (ref <18)
Troponin I (High Sensitivity): 7 ng/L (ref <18)

## 2024-05-13 LAB — BRAIN NATRIURETIC PEPTIDE: B Natriuretic Peptide: 46.8 pg/mL (ref 0.0–100.0)

## 2024-05-13 LAB — SARS CORONAVIRUS 2 BY RT PCR: SARS Coronavirus 2 by RT PCR: NEGATIVE

## 2024-05-13 MED ORDER — DILTIAZEM HCL ER COATED BEADS 360 MG PO CP24: 360.0000 mg | ORAL_CAPSULE | Freq: Every day | ORAL | 3 refills | Status: AC

## 2024-05-13 NOTE — Telephone Encounter (Signed)
 FYI Only or Action Required?: FYI only for provider.  Patient was last seen in primary care on 05/11/2023 by Kennyth Worth HERO, MD.  Called Nurse Triage reporting Palpitations.  Symptoms began today.  Interventions attempted: Rest, hydration, or home remedies.  Symptoms are: unchanged.  Triage Disposition: Call EMS 911 Now  Patient/caregiver understands and will follow disposition?: Yes Reason for Disposition  Sounds like a life-threatening emergency to the triager  Answer Assessment - Initial Assessment Questions This RN contacted 911 on patient's behalf. Patient has neighbor in home with her. Advised to have neighbor unlock her door and collect belongings. Advised NPO. This RN remained on the telephone with patient until EMS arrival.  1. DESCRIPTION: Please describe your heart rate or heartbeat that you are having (e.g., fast/slow, regular/irregular, skipped or extra beats, palpitations)     Patient reports cardiac device alerted Afib/106 approximately 15 minutes before calling. Reports 5 minutes later her HR was 76 and did not indicate Afib. Reports SOB since. Reports dizziness occurred while in Afib but has resolved. Able to speak in clear and full sentences.   2. ONSET: When did it start? (e.g., minutes, hours, days)      15 minutes  3. DURATION: How long does it last (e.g., seconds, minutes, hours)     5 minutes  7. RECURRENT SYMPTOM: Have you ever had this before? If Yes, ask: When was the last time? and What happened that time?      Yes, hx of Afib  8. CAUSE: What do you think is causing the palpitations?     Afib  9. CARDIAC HISTORY: Do you have any history of heart disease? (e.g., heart attack, angina, bypass surgery, angioplasty, arrhythmia)      Afib  Protocols used: Heart Rate and Heartbeat Questions-A-AH Copied from CRM [id number]. Topic: Clinical - Red Word Triage >> May 13, 2024  2:40 PM Shereese L wrote: [facility] that prompted transfer to Nurse  Triage: Cardia care device reads 106 beats per min a fib waited 5 min And it lowered to 76 beats per min with shortness of breath Just took off a heart monitor Sunday

## 2024-05-13 NOTE — ED Triage Notes (Signed)
 Pt BIB [facility] from [facility] with c/o SOB increases with exertion. Hx of A-fib. Hr with EMS between 64-120.   EMS Vitals   Bp 178/62 HR 64  Rr 18 Sp02 95 RA    IV 18G R hand

## 2024-05-13 NOTE — ED Provider Notes (Signed)
 [place] EMERGENCY DEPARTMENT AT [facility] Provider Note   CSN: [id number] Arrival date & time: 05/13/24  1619     Patient presents with: Shortness of Breath and Tachycardia   Nancy Mccormick is a 89 y.o. female.  She is brought in by ambulance after feeling increased weakness shortness of breath fatigue that occurred while doing some basic cleaning at her apartment today.  She checked her cardiac monitor and found to be in A-fib and she said her heart rate was over 100.  She said she has a history of atrial fibrillation and has been compliant with medications.  Cardiologist is Dr. Mona.  She said she has noticed more episodes of weakness and tachycardia for the past few weeks.  She called her cardiologist who told her to monitor it and come to the hospital if she gets another significant episode.  She denies any fevers chills nausea vomiting.  No chest pain.  Has been eating and drinking well.   The history is provided by the patient.  Shortness of Breath Severity:  Moderate Onset quality:  Sudden Duration:  1 hour Timing:  Constant Progression:  Improving Chronicity:  Recurrent Context: activity   Relieved by:  Rest Worsened by:  Activity Associated symptoms: no abdominal pain, no chest pain, no cough, no fever, no hemoptysis, no syncope and no vomiting        Prior to Admission medications   Medication Sig Start Date End Date Taking? Authorizing Provider  acetaminophen  (TYLENOL ) 500 MG tablet Take 1,000 mg by mouth every 6 (six) hours as needed for mild pain or moderate pain.    [provider]  Calcium  Carbonate-Vitamin D  (CALCIUM  + D PO) Take 1 tablet by mouth at bedtime.    [provider]  Cholecalciferol 50 MCG (2000 UT) TABS Take by mouth.    [provider]  diltiazem  (CARDIZEM  CD) 180 MG 24 hr capsule TAKE 1 CAPSULE(180 MG) BY MOUTH DAILY 08/03/23   Lavona Agent, MD  diltiazem  (CARDIZEM  SR) 60 MG 12 hr capsule Take 1 capsule  (60 mg total) by mouth 2 (two) times daily as needed (BP > 160/90). 05/05/23   Kennyth Worth HERO, MD  ELIQUIS  5 MG TABS tablet TAKE 1 TABLET(5 MG) BY MOUTH TWICE DAILY 08/05/23   Kennyth Worth HERO, MD  ezetimibe  (ZETIA ) 10 MG tablet Take 1 tablet (10 mg total) by mouth daily. 07/31/23 10/29/23  Lavona Agent, MD  Multiple Vitamin (MULTIVITAMIN WITH MINERALS) TABS tablet Take 1 tablet by mouth at bedtime.    [provider]  nitroGLYCERIN  (NITROSTAT ) 0.4 MG SL tablet Place 1 tablet (0.4 mg total) under the tongue as needed. 06/20/14   Georgina Nancyann ORN, MD  Saline (SIMPLY SALINE) 0.9 % AERS Place 2 each into the nose as directed. Use nightly for sinus hygiene long-term.  Can also be used as many times daily as desired to assist with clearing congested sinuses. 10/18/22   Jesus Bernardino MATSU, MD    Allergies: Shellfish allergy and Influenza vaccines    Review of Systems  Constitutional:  Positive for fatigue. Negative for fever.  Respiratory:  Positive for shortness of breath. Negative for cough and hemoptysis.   Cardiovascular:  Positive for palpitations. Negative for chest pain and syncope.  Gastrointestinal:  Negative for abdominal pain and vomiting.  Genitourinary:  Negative for dysuria.    Updated Vital Signs BP (!) 175/89 (BP Location: Right Arm)   Pulse 87   Temp 98.2 F (36.8 C) (Oral)  Resp 20   SpO2 98%   Physical Exam Vitals and nursing note reviewed.  Constitutional:      General: She is not in acute distress.    Appearance: Normal appearance. She is well-developed.  HENT:     Head: Normocephalic and atraumatic.  Eyes:     Conjunctiva/sclera: Conjunctivae normal.  Cardiovascular:     Rate and Rhythm: Normal rate and regular rhythm.     Heart sounds: No murmur heard. Pulmonary:     Effort: Pulmonary effort is normal. No respiratory distress.     Breath sounds: Normal breath sounds. No stridor. No wheezing.  Abdominal:     Palpations: Abdomen is soft.     Tenderness:  There is no abdominal tenderness. There is no guarding or rebound.  Musculoskeletal:        General: No deformity.     Cervical back: Neck supple.     Right lower leg: No edema.     Left lower leg: No edema.  Skin:    General: Skin is warm and dry.  Neurological:     General: No focal deficit present.     Mental Status: She is alert.     GCS: GCS eye subscore is 4. GCS verbal subscore is 5. GCS motor subscore is 6.     (all labs ordered are listed, but only abnormal results are displayed) Labs Reviewed  SARS CORONAVIRUS 2 BY RT PCR  BASIC METABOLIC PANEL WITH GFR  CBC WITH DIFFERENTIAL/PLATELET  BRAIN NATRIURETIC PEPTIDE  TROPONIN I (HIGH SENSITIVITY)  TROPONIN I (HIGH SENSITIVITY)    EKG: EKG Interpretation Date/Time:  Friday May 13 2024 16:49:26 EDT Ventricular Rate:  75 PR Interval:  179 QRS Duration:  90 QT Interval:  399 QTC Calculation: 446 R Axis:   -3  Text Interpretation: Sinus rhythm Low voltage, precordial leads Consider anterior infarct Minimal ST depression, inferior leads No significant change since prior 10/24 Confirmed by Towana Sharper [phone]) on 05/13/2024 4:50:31 PM  Radiology: [facility] Chest Port 1 View Result Date: 05/13/2024 CLINICAL DATA:  sob, tachycardic EXAM: PORTABLE CHEST 1 VIEW COMPARISON:  Chest x-ray 01/01/2022 FINDINGS: The heart and mediastinal contours are unchanged. Atherosclerotic plaque. No focal consolidation. No pulmonary edema. No pleural effusion. No pneumothorax. No acute osseous abnormality.  Old healed left humeral fracture. IMPRESSION: 1. No active disease. 2.  Aortic Atherosclerosis (ICD10-I70.0). Electronically Signed   By: Morgane  Naveau M.D.   On: 05/13/2024 17:30     Procedures   Medications Ordered in the ED - No data to display  Clinical Course as of 05/14/24 0838  Fri May 13, 2024  1734 Report from patients long term monitor - Patch Wear Time:  13 days and 23 hours (2025-07-19T08:03:30-0400 to  2025-08-02T07:14:02-399)   Patient had a min HR of 43 bpm, max HR of 214 bpm, and avg HR of 71 bpm. Predominant underlying rhythm was Sinus Rhythm. First Degree AV Block was present. 2608 Supraventricular Tachycardia runs occurred, the run with the fastest interval lasting 5 beats  with a max rate of 214 bpm, the longest lasting 3 mins 3 secs with an avg rate of 108 bpm. Isolated SVEs were frequent (20.5%, [id number]), SVE Couplets were occasional (3.4%, 24716), and SVE Triplets were occasional (1.8%, 8769). Isolated VEs were rare  (<1.0%), VE Couplets were rare (<1.0%), and no VE Triplets were present.  [MB]  1802 Patient's lab work fairly unrevealing.  The outpatient long-term monitor did show episodes of SVT.  I reviewed this with  cardiology on-call Dr. Francyne who will see the patient and see if he can help with some recommendations. [MB]  1920 Patient was seen Dr. Francyne cardiology.  He does not feel she needs to be admitted but is adjusting her diltiazem .  Recommend follow-up outpatient in cardiology office. [MB]  1927 Reviewed results with patient and she is comfortable plan for discharge. [MB]    Clinical Course User Index [MB] Towana Ozell BROCKS, MD                                 Medical Decision Making Amount and/or Complexity of Data Reviewed Labs: ordered. Radiology: ordered.   This patient complains of feeling generally weak and elevated heart rate; this involves an extensive number of treatment Options and is a complaint that carries with it a high risk of complications and morbidity. The differential includes arrhythmia, ACS, dehydration, metabolic derangement  I ordered, reviewed and interpreted labs, which included CBC normal chemistries normal BNP and troponin normal COVID-negative I ordered imaging studies which included chest x-ray and I independently    visualized and interpreted imaging which showed unremarkable Additional history obtained from patient's family  members Previous records obtained and reviewed in epic including recent cardiology notes I consulted cardiology Dr. Francyne and discussed lab and imaging findings and discussed disposition.  Cardiac monitoring reviewed, sinus rhythm Social determinants considered, no significant barriers Critical Interventions: None  After the interventions stated above, I reevaluated the patient and found patient to be feeling better and no significant arrhythmias here Admission and further testing considered, no indications for admission.  Cardiology is increasing her calcium  channel blocker.  Placed referral for close outpatient follow-up.  Return instructions discussed.      Final diagnoses:  SVT (supraventricular tachycardia) [facility])    ED Discharge Orders          Ordered    diltiazem  (CARDIZEM  CD) 360 MG 24 hr capsule  Daily        05/13/24 1908    Ambulatory referral to Cardiology       Comments: If you have not heard from the Cardiology office within the next 72 hours please call [phone].   05/13/24 1921               Towana Ozell BROCKS, MD 05/14/24 [phone]

## 2024-05-13 NOTE — Discharge Instructions (Addendum)
 You were seen in the emergency department for episodes of rapid heart rate and weakness.  Your outpatient cardiac monitor showed multiple episodes of fast rhythms.  This is likely a cause of your symptoms.  Cardiology saw you here and is recommending you increase your diltiazem .  A prescription has been sent to the pharmacy.  Please start this medicine and follow-up with Dr. Mona.

## 2024-05-13 NOTE — Progress Notes (Signed)
 Cardiology Consultation   Patient ID: Nancy Mccormick MRN: [id number]; DOB: 03/27/1934  Admit date: 05/13/2024 Date of Consult: 05/13/2024  PCP:  Kennyth Worth HERO, MD   Nancy Mccormick Cardiologist:  Lynwood Schilling, MD        Patient Profile: Nancy Mccormick is a 89 y.o. female with a hx of hypertension, dyslipidemia and paroxysmal atrial fibrillation who is being seen 05/13/2024 for the evaluation of weakness, fatigue, palpitations at the request of Dr. Towana.  History of Present Illness: Nancy Mccormick is an 89 year old woman with history of remote ischemic CVA 2018 in the setting of atrial fibrillation.  Recently she has been complaining of extreme weakness and fatigue as well exertional dyspnea with housecleaning.  Workup for similar symptoms in 2023 included a normal myocardial perfusion study, normal left ventricular function on echocardiogram (mild aortic insufficiency), absence of pulmonary embolism on CT chest and a normal BNP.  Symptoms improved spontaneously and she did well for couple of years, including when she was last seen in the office in October 2024.  This changed over the last several weeks when the same complaints of dizziness, exertional dyspnea and in particular severe weakness have resurfaced.  She has occasional palpitations but these do not bother her much and occur infrequently.  She has a Kardia device and this has shown frequently that she has an accelerated heart rate labeled as atrial fibrillation, around 108 bpm, but not faster than that.  She does not have chest pain at rest or with activity.  She has not had syncope.  Has not had recent problems with fever, chills, orthopnea, PND, cough, hemoptysis, lower extremity edema, major weight changes, heat/cold intolerance, nausea vomiting or diarrhea.  Because of these complaints, Dr. Schilling ordered a 2-week arrhythmia monitor that was recently completed, but not yet formally reported.  This is currently  available for review and shows extremely frequent supraventricular arrhythmia.  Over 25% of all her QRS complexes represent premature atrial contractions and isolated/couplet/triplet fashion.  In addition she had over 2600 episodes of atrial tachycardia.  Most of these are nonsustained, but some are sustained and last up to 3.5 minutes.  The very short episodes may be as fast as 214 bpm, but on average the sustained events are around 100-110 bpm.  True atrial fibrillation is not recorded.  No significant ventricular arrhythmia is seen.  No severe bradycardia, pauses or high-grade AV block is seen.  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  Her blood pressure has been relatively elevated, typically in the 160s at home.  She reports that in the past she took as much as 360 mg of diltiazem  for her blood pressure.  She has been compliant with Eliquis  anticoagulation and has not had any falls or bleeding events.  Past Medical History:  Diagnosis Date   Arthritis    back, left shoulder, and fingers   Atrial fibrillation (HCC)    History of chicken pox    Hyperlipidemia    Hypertension    Osteoporosis    Stroke [facility])     Past Surgical History:  Procedure Laterality Date   CATARACT EXTRACTION     KNEE ARTHROSCOPY Right 07/04/2015   Procedure: RIGHT KNEE ARTHROSCOPY WITH MEDIAL MENISCAL DEBRIDEMENT AND CHONDROPLASTY;  Surgeon: Dempsey Moan, MD;  Location: [facility];  Service: Orthopedics;  Laterality: Right;   OPEN REDUCTION INTERNAL FIXATION (ORIF) DISTAL RADIAL FRACTURE Right 12/28/2012   Procedure: OPEN REDUCTION INTERNAL FIXATION (ORIF) DISTAL RADIAL FRACTURE;  Surgeon:  Maude KANDICE Herald, MD;  Location: [facility];  Service: Orthopedics;  Laterality: Right;   VAGINAL HYSTERECTOMY         Scheduled Meds:  Continuous Infusions:  PRN Meds:   Allergies:    Allergies  Allergen Reactions   Shellfish Allergy Itching, Swelling and Rash    Influenza Vaccines     Social History:   Social History   Socioeconomic History   Marital status: Widowed    Spouse name: Not on file   Number of children: 3   Years of education: Not on file   Highest education level: Not on file  Occupational History   Occupation: Retired    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 She has three sons and three grandsons.None live locally but she gets to talk with them often and sees them pretty regularly. 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  She enjoys having lunch out with friends regularly. She plans to move to a   Social Drivers of Corporate investment banker Strain: Low Risk  (03/09/2024)   Overall Financial Resource Strain (CARDIA)    Difficulty of Paying Living Expenses: Not hard at all  Food Insecurity: No Food Insecurity (03/09/2024)   Hunger Vital Sign    Worried About Running Out of Food in the Last Year: Never true    Ran Out of Food in the Last Year: Never true  Transportation Needs: No Transportation Needs (03/09/2024)   PRAPARE - Administrator, Civil Service (Medical): No    Lack of Transportation (Non-Medical): No  Physical Activity: Insufficiently Active (03/09/2024)   Exercise Vital Sign    Days of Exercise per Week: 4  days    Minutes of Exercise per Session: 30 min  Stress: No Stress Concern Present (03/09/2024)   [employer] of Occupational Health - Occupational Stress Questionnaire    Feeling of Stress : Not at all  Social Connections: Moderately Integrated (03/09/2024)   Social Connection and Isolation Panel    Frequency of Communication with Friends and Family: More than three times a week    Frequency of Social Gatherings with Friends and Family: More than three times a week    Attends Religious Services: More than 4 times per year    Active Member of [employer] or Organizations: Yes    Attends Banker Meetings: More than 4 times per year    Marital Status: Widowed  Intimate Partner Violence: Not At Risk (03/09/2024)   Humiliation, Afraid, Rape, and Kick questionnaire    Fear of Current or Ex-Partner: No    Emotionally Abused: No    Physically Abused:  No    Sexually Abused: No    Family History:   Her mother had a pacemaker Family History  Problem Relation Age of Onset   Stroke Mother 1   Anemia Mother    Heart disease Mother    Hearing loss Mother    Heart attack Mother    Hypertension Mother    Stroke Father 83   Early death Father    Hypertension Father    Stroke Sister 41   Diabetes Sister    Hypertension Sister    Hypertension Son      ROS:  Please see the history of present illness.   All other ROS reviewed and negative.     Physical Exam/Data: Vitals:   05/13/24 1627 05/13/24 1730 05/13/24 1830 05/13/24 1900  BP: (!) 175/89 (!) 194/78 (!) 174/79 (!) 195/84  Pulse: 87 61 63 68  Resp: 20 19 14 13   Temp: 98.2 F (36.8 C)     TempSrc: Oral     SpO2: 98% 99% 95% 98%   No intake or output data in the 24 hours ending 05/13/24 1931    03/09/2024    3:22 PM 07/31/2023    7:56 AM 05/11/2023    9:13 AM  Last 3 Weights  Weight (lbs) 186 lb 186 lb 3.2 oz 186 lb 9.6 oz  Weight (kg) 84.369 kg 84.46 kg 84.641 kg     There is no height or weight on file to calculate  BMI.  General:  Well nourished, well developed, in no acute distress.  She is lying fully supine in bed with no respiratory difficulty HEENT: normal Neck: no JVD Vascular: No carotid bruits; Distal pulses 2+ bilaterally Cardiac:  normal S1, S2; RRR; no murmur.  Rare ectopic beats are heard. Lungs:  clear to auscultation bilaterally, no wheezing, rhonchi or rales  Abd: soft, nontender, no hepatomegaly  Ext: no edema Musculoskeletal:  No deformities, BUE and BLE strength normal and equal Skin: warm and dry  Neuro:  CNs 2-12 intact, no focal abnormalities noted Psych:  Normal affect   EKG:  The EKG was personally reviewed and demonstrates:  Sinus rhythm, somewhat delayed anterior R wave progression, no ischemic repolarization abnormalities Telemetry:  Telemetry was personally reviewed and demonstrates: Sinus rhythm with frequent premature atrial complexes  Relevant CV Studies: Reviewed echocardiogram and nuclear stress test from 2023  Laboratory Data: High Sensitivity Troponin:   Recent Labs  Lab 05/13/24 1634 05/13/24 1834  TROPONINIHS 7 7     Chemistry Recent Labs  Lab 05/13/24 1634  NA 143  K 4.0  CL 108  CO2 24  GLUCOSE 99  BUN 15  CREATININE 0.71  CALCIUM  8.9  GFRNONAA >60  ANIONGAP 11    No results for input(s): PROT, ALBUMIN, AST, ALT, ALKPHOS, BILITOT in the last 168 hours. Lipids No results for input(s): CHOL, TRIG, HDL, LABVLDL, LDLCALC, CHOLHDL in the last 168 hours.  Hematology Recent Labs  Lab 05/13/24 1634  WBC 10.1  RBC 4.51  HGB 13.4  HCT 41.8  MCV 92.7  MCH 29.7  MCHC 32.1  RDW 13.5  PLT 336   Thyroid  No results for input(s): TSH, FREET4 in the last 168 hours.  BNP Recent Labs  Lab 05/13/24 1634  BNP 46.8    DDimer No results for input(s): DDIMER in the last 168 hours.  Radiology/Studies:  DG Chest Port 1 View Result Date: 05/13/2024 CLINICAL DATA:  sob, tachycardic EXAM: PORTABLE CHEST 1 VIEW COMPARISON:   Chest x-ray  01/01/2022 FINDINGS: The heart and mediastinal contours are unchanged. Atherosclerotic plaque. No focal consolidation. No pulmonary edema. No pleural effusion. No pneumothorax. No acute osseous abnormality.  Old healed left humeral fracture. IMPRESSION: 1. No active disease. 2.  Aortic Atherosclerosis (ICD10-I70.0). Electronically Signed   By: Morgane  Naveau M.D.   On: 05/13/2024 17:30     Assessment and Plan: Paroxysmal atrial tachycardia: Extremely frequent and probably responsible for her symptoms.  She has not had significant bradycardia.  Heart rate in the emergency room is in the 70s and her blood pressure is in the 160-170s/80s.  Will increase her diltiazem  to 360 mg once daily and have her keep her appointment with Dr. Lavona in a couple of weeks.  Asked her to keep a log of her heart rate and blood pressure and bring that to the appointment.  It is quite possible that she has pulmonary vein tachycardia, which may also be the trigger for her atrial fibrillation. I think she would benefit from referral to electrophysiology for specialty consultation to discuss ablation, if adjusting the dose of diltiazem  is insufficient to control her complaints.  Also reasonable to try beta-blockers or even flecainide, if a less invasive approach is preferred.  Repeat evaluation for coronary insufficiency is preferable if flecainide is chosen. Paroxysmal atrial fibrillation: None recorded in the last 2 weeks, on the monitor.  She should remain on long-term Eliquis  anticoagulation.  CHA2DS2-VASc score is 7. HTN: Blood pressure is elevated today and she reports that has been running in the same range at home recently.  She also reports that she has taken the double dose of diltiazem  in the past for her blood pressure.  Hopefully the diltiazem  will take care of this at the 360 mg dose. Dyslipidemia/history of statin myopathy: Taking ezetimibe .  She does not have a history of CAD or PAD, although mild  coronary artery atherosclerosis is seen on her CT chest from 2023. Aortic atherosclerosis: Although there is scanty calcification of coronary arteries, the aortic arch has extensive atherosclerotic calcifications.   Risk Assessment/Risk Scores:         CHA2DS2-VASc Score = 7   This indicates a 11.2% annual risk of stroke. The patient's score is based upon: CHF History: 0 HTN History: 1 Diabetes History: 0 Stroke History: 2 Vascular Disease History: 1 Age Score: 2 Gender Score: 1     [facility] will sign off.   The patient is ready for discharge today from a cardiac standpoint. Medication Recommendations: Increase diltiazem  to 360 mg daily Other recommendations (labs, testing, etc): Please keep daily log of heart rate and blood pressure Follow up as an outpatient: Keep follow-up appointment with Dr. Lavona 06/02/2024  For questions or updates, please contact [facility] Please consult [email or web address] for contact info under    Signed, Jerel Balding, MD  05/13/2024 7:31 PM

## 2024-05-16 ENCOUNTER — Ambulatory Visit: Payer: Self-pay | Admitting: Cardiology

## 2024-05-16 DIAGNOSIS — I480 Paroxysmal atrial fibrillation: Secondary | ICD-10-CM

## 2024-05-16 DIAGNOSIS — R002 Palpitations: Secondary | ICD-10-CM | POA: Diagnosis not present

## 2024-05-18 ENCOUNTER — Telehealth: Payer: Self-pay | Admitting: Cardiology

## 2024-05-18 NOTE — Telephone Encounter (Signed)
 Pt c/o medication issue:  1. Name of Medication:   diltiazem  (CARDIZEM  CD) 360 MG 24 hr capsule    2. How are you currently taking this medication (dosage and times per day)?  Take 1 capsule (360 mg total) by mouth daily.      3. Are you having a reaction (difficulty breathing--STAT)? No  4. What is your medication issue? Pt is requesting a callback regarding her medication change since being in the ED last Friday. Please advise

## 2024-05-18 NOTE — Telephone Encounter (Signed)
 Spoke to patient she stated she has been having episodes of fast heart beat and extreme weakness for the past several weeks.Stated she went to ED last Thursday 8/7.Diltiazem  was increased to 360 mg daily.Stated she cannot tell it has helped.Appointment scheduled with Dr.Hochrein 8/22 at 1:20 pm.Advised to go to ED if symptoms worsen.I will make Dr.Hochrein aware.

## 2024-05-26 NOTE — Progress Notes (Unsigned)
 Cardiology Office Note:   Date:  05/27/2024  ID:  Nancy Mccormick, DOB May 01, 1934, MRN [id number] PCP: Kennyth Worth HERO, MD  [facility] Providers Cardiologist:  Lynwood Schilling, MD {  History of Present Illness:   Nancy Mccormick is a 89 y.o. female who presents for followup of atrial fibrillation she was admitted in Nov 2018 with a small MCA infarct and was in atrial fib.  She was in the hospital in August of 2019.  She had atrial fib with RVR which converted spontaneously to NSR.     She was in the emergency room in March 2023 with shortness of breath.    There was no clear cardiac etiology.  She followed up with continued SOB and profound fatigue.  She has had an extensive work up to include echo with only mild AI.  There was a normal BNP.  She had a negative perfusion study and has had lab work with no anemia and a normal TSH.  In the ED CXR was unremarkable.  There was no PE on CT.  She had a head CT and had no acute findings.    Since I last saw her she had palpitations in July and I ordered a monitor. She had NSR.  She had frequent runs of SVT with the longest lasting 3 minutes.    She was in the ED in early August 2025 with palpitations.  Her wearable at home suggested atrial fibrillation.  I have reviewed these and she has had some episodes of SVT but looks to be regular and not prolonged.  It is consistent with what we saw on the recent monitor.  I did not see atrial fibrillation.  She does have ventricular ectopy.  In the emergency room which I reviewed for this visit her BNP was normal.  Troponin was negative x 2.  There were no objective findings.  She comes in today and she is acutely short of breath though her oxygen  saturations are excellent.  She is tachypneic.  She is anxious.  Her blood pressure is as below.  She is not having any chest pressure.  She is not having any recent PND or orthopnea.  She has had no     ROS: As stated in the HPI and negative for all other  systems.\  Studies Reviewed:    EKG:   EKG Interpretation Date/Time:  Friday May 27 2024 13:31:59 EDT Ventricular Rate:  81 PR Interval:  174 QRS Duration:  68 QT Interval:  396 QTC Calculation: 460 R Axis:   85  Text Interpretation: Sinus rhythm with Premature supraventricular complexes Nonspecific ST abnormality When compared with ECG of 13-May-2024 16:49, No significant change since last tracing Confirmed by Schilling Lynwood ([id number]) on 05/27/2024 1:44:18 PM     Risk Assessment/Calculations:    CHA2DS2-VASc Score = 7   This indicates a 11.2% annual risk of stroke. The patient's score is based upon: CHF History: 0 HTN History: 1 Diabetes History: 0 Stroke History: 2 Vascular Disease History: 1 Age Score: 2 Gender Score: 1    Physical Exam:   VS:  BP (!) 170/70   Pulse (!) 54   Ht 5' 4 (1.626 m)   Wt 187 lb (84.8 kg)   SpO2 94%   BMI 32.10 kg/m    Wt Readings from Last 3 Encounters:  05/27/24 187 lb (84.8 kg)  03/09/24 186 lb (84.4 kg)  07/31/23 186 lb 3.2 oz (84.5 kg)     GEN: Well nourished, well  developed in no acute distress NECK: No JVD; No carotid bruits CARDIAC: RRR, no murmurs, rubs, gallops RESPIRATORY:  Clear to auscultation without rales, wheezing or rhonchi  ABDOMEN: Soft, non-tender, non-distended EXTREMITIES:  No edema; No deformity   ASSESSMENT AND PLAN:   ATRIAL FIBRILLATION:    I do not see any paroxysms of atrial fibrillation.  She tolerates anticoagulation.  I will be making the changes below.  HTN:   The blood pressure is elevated.  I will be managing this with the addition of beta-blocker as below.  She can keep an eye on this at home.     DYSLIPIDEMIA:    LDL is elevated but she has not bee tolerant of statins.   She will continue with Zetia  and I will defer follow-up to Dr. Kennyth.    PALPITATIONS: I am going to add a low-dose of beta-blocker to see if this does not improve her symptomatically.  Given the recent negative findings on  her ER visits and the monitor and not suggesting any other studies.  She certainly should come back with any acute symptoms.       Follow up with APP in six weeks.   Signed, Lynwood Schilling, MD

## 2024-05-27 ENCOUNTER — Ambulatory Visit: Attending: Cardiology | Admitting: Cardiology

## 2024-05-27 ENCOUNTER — Encounter: Payer: Self-pay | Admitting: Cardiology

## 2024-05-27 VITALS — BP 170/70 | HR 54 | Ht 64.0 in | Wt 187.0 lb

## 2024-05-27 DIAGNOSIS — I480 Paroxysmal atrial fibrillation: Secondary | ICD-10-CM | POA: Diagnosis not present

## 2024-05-27 DIAGNOSIS — E785 Hyperlipidemia, unspecified: Secondary | ICD-10-CM

## 2024-05-27 DIAGNOSIS — I10 Essential (primary) hypertension: Secondary | ICD-10-CM | POA: Diagnosis not present

## 2024-05-27 DIAGNOSIS — R002 Palpitations: Secondary | ICD-10-CM | POA: Diagnosis not present

## 2024-05-27 MED ORDER — METOPROLOL TARTRATE 25 MG PO TABS: 25.0000 mg | ORAL_TABLET | Freq: Two times a day (BID) | ORAL | 3 refills | Status: DC

## 2024-05-27 NOTE — Patient Instructions (Signed)
 Medication Instructions:  Start Metoprolol  Tartrate 25 mg twice a day Continue all other medications *If you need a refill on your cardiac medications before your next appointment, please call your pharmacy*  Lab Work: None ordered  Testing/Procedures: None ordered  Follow-Up: At [facility], you and your health needs are our priority.  As part of our continuing mission to provide you with exceptional heart care, our providers are all part of one team.  This team includes your primary Cardiologist (physician) and Advanced Practice Providers or APPs (Physician Assistants and Nurse Practitioners) who all work together to provide you with the care you need, when you need it.  Your next appointment:  6 weeks    Provider:  PA   We recommend signing up for the patient portal called MyChart.  Sign up information is provided on this After Visit Summary.  MyChart is used to connect with patients for Virtual Visits (Telemedicine).  Patients are able to view lab/test results, encounter notes, upcoming appointments, etc.  Non-urgent messages can be sent to your provider as well.   To learn more about what you can do with MyChart, go to [email or web address].

## 2024-06-02 ENCOUNTER — Ambulatory Visit: Admitting: Cardiology

## 2024-06-03 ENCOUNTER — Other Ambulatory Visit

## 2024-06-15 ENCOUNTER — Other Ambulatory Visit: Payer: Self-pay | Admitting: Cardiology

## 2024-06-24 ENCOUNTER — Encounter: Payer: Self-pay | Admitting: Family Medicine

## 2024-06-24 ENCOUNTER — Ambulatory Visit (INDEPENDENT_AMBULATORY_CARE_PROVIDER_SITE_OTHER): Admitting: Family Medicine

## 2024-06-24 VITALS — BP 140/70 | HR 46 | Temp 97.2°F | Ht 64.0 in | Wt 193.8 lb

## 2024-06-24 DIAGNOSIS — I4891 Unspecified atrial fibrillation: Secondary | ICD-10-CM

## 2024-06-24 DIAGNOSIS — I10 Essential (primary) hypertension: Secondary | ICD-10-CM

## 2024-06-24 DIAGNOSIS — R739 Hyperglycemia, unspecified: Secondary | ICD-10-CM

## 2024-06-24 DIAGNOSIS — E785 Hyperlipidemia, unspecified: Secondary | ICD-10-CM | POA: Diagnosis not present

## 2024-06-24 DIAGNOSIS — Z0001 Encounter for general adult medical examination with abnormal findings: Secondary | ICD-10-CM

## 2024-06-24 LAB — TSH: TSH: 3.83 u[IU]/mL (ref 0.35–5.50)

## 2024-06-24 LAB — COMPREHENSIVE METABOLIC PANEL WITH GFR
ALT: 11 U/L (ref 0–35)
AST: 13 U/L (ref 0–37)
Albumin: 3.9 g/dL (ref 3.5–5.2)
Alkaline Phosphatase: 100 U/L (ref 39–117)
BUN: 20 mg/dL (ref 6–23)
CO2: 29 meq/L (ref 19–32)
Calcium: 9.4 mg/dL (ref 8.4–10.5)
Chloride: 106 meq/L (ref 96–112)
Creatinine, Ser: 0.85 mg/dL (ref 0.40–1.20)
GFR: 61.38 mL/min (ref >60.00)
Glucose, Bld: 93 mg/dL (ref 70–99)
Potassium: 4.8 meq/L (ref 3.5–5.1)
Sodium: 143 meq/L (ref 135–145)
Total Bilirubin: 0.5 mg/dL (ref 0.2–1.2)
Total Protein: 6.9 g/dL (ref 6.0–8.3)

## 2024-06-24 LAB — CBC
HCT: 40.3 % (ref 36.0–46.0)
Hemoglobin: 13.3 g/dL (ref 12.0–15.0)
MCHC: 32.9 g/dL (ref 30.0–36.0)
MCV: 90.8 fl (ref 78.0–100.0)
Platelets: 330 K/uL (ref 150.0–400.0)
RBC: 4.44 Mil/uL (ref 3.87–5.11)
RDW: 13.7 % (ref 11.5–15.5)
WBC: 11.3 K/uL — ABNORMAL HIGH (ref 4.0–10.5)

## 2024-06-24 LAB — LIPID PANEL
Cholesterol: 195 mg/dL (ref 0–200)
HDL: 57.8 mg/dL (ref >39.00)
LDL Cholesterol: 109 mg/dL — ABNORMAL HIGH (ref 0–99)
NonHDL: 137.68
Total CHOL/HDL Ratio: 3
Triglycerides: 145 mg/dL (ref 0.0–149.0)
VLDL: 29 mg/dL (ref 0.0–40.0)

## 2024-06-24 LAB — HEMOGLOBIN A1C: Hgb A1c MFr Bld: 6.6 % — ABNORMAL HIGH (ref 4.6–6.5)

## 2024-06-24 MED ORDER — APIXABAN 5 MG PO TABS: 5.0000 mg | ORAL_TABLET | Freq: Two times a day (BID) | ORAL | 1 refills | Status: AC

## 2024-06-24 NOTE — Patient Instructions (Signed)
 It was very nice to see you today!  VISIT SUMMARY: Today, we discussed your heart condition, medication side effects, and strategies to manage your symptoms. We also addressed your concerns about dizziness, fatigue, and swelling in your feet. Additionally, we performed ear irrigation to help with your hearing loss.  YOUR PLAN: TACHYCARDIA AND BRADYCARDIA: You have been experiencing episodes of fast and slow heart rates. -Continue taking metoprolol  as prescribed. Discuss any side effects and dosing concerns with your cardiologist at your next appointment. -Refill your Eliquis  prescription.  DIZZINESS AND FATIGUE: These symptoms are likely due to your current medications, metoprolol  and diltiazem . -Discuss these symptoms with your cardiologist at your next appointment. Consider adjusting your diltiazem  dosage with your cardiologist.  PERIPHERAL EDEMA: Swelling in your feet, likely caused by diltiazem . -Discuss this with your cardiologist at your next appointment. -Elevate your legs, reduce sodium intake, consider compression stockings, and increase walking to help manage the swelling.  ESSENTIAL HYPERTENSION: Your blood pressure is elevated. -We will recheck your blood pressure before you leave the office.  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 -We performed ear irrigation to remove the earwax.  Return in about 1 year (around 06/24/2025) for Annual Physical.   Take care, Dr Kennyth  PLEASE NOTE:  If you had any lab tests, please let us  know if you have not heard back within a few days. You may see your results on mychart before we have a chance to review them but we will give you a call once they are reviewed by us .   If we ordered any referrals today, please let us  know if you have not heard from their office within the next week.   If you had any urgent prescriptions sent in today, please check with the pharmacy within an  hour of our visit to make sure the prescription was transmitted appropriately.   Please try these tips to maintain a healthy lifestyle:  Eat at least 3 REAL meals and 1-2 snacks per day.  Aim for no more than 5 hours between eating.  If you eat breakfast, please do so within one hour of getting up.   Each meal should contain half fruits/vegetables, one quarter protein, and one quarter carbs (no bigger than a computer mouse)  Cut down on sweet beverages. This includes juice, soda, and sweet tea.   Drink at least 1 glass of water with each meal and aim for at least 8 glasses per day  Exercise at least 150 minutes every week.    Preventive Care 66 Years and Older, Female Preventive care refers to lifestyle choices and visits with your health care provider that can promote health and wellness. Preventive care visits are also called wellness exams. What can I expect for my preventive care visit? Counseling Your health care provider may ask you questions about your: Medical history, including: Past medical problems. Family medical history. Pregnancy and menstrual history. History of falls. Current health, including: Memory and ability to understand (cognition). Emotional well-being. Home life and relationship well-being. Sexual activity and sexual health. Lifestyle, including: Alcohol, nicotine or tobacco, and drug use. Access to firearms. Diet, exercise, and sleep habits. Work and work Astronomer. Sunscreen use. Safety issues such as seatbelt and bike helmet use. Physical exam Your health care provider will check your: Height and weight. These may be used to calculate your BMI (body mass index). BMI is a measurement that tells if you are at a healthy  weight. Waist circumference. This measures the distance around your waistline. This measurement also tells if you are at a healthy weight and may help predict your risk of certain diseases, such as type 2 diabetes and high blood  pressure. Heart rate and blood pressure. Body temperature. Skin for abnormal spots. What immunizations do I need?  Vaccines are usually given at various ages, according to a schedule. Your health care provider will recommend vaccines for you based on your age, medical history, and lifestyle or other factors, such as travel or where you work. What tests do I need? Screening Your health care provider may recommend screening tests for certain conditions. This may include: Lipid and cholesterol levels. Hepatitis C test. Hepatitis B test. HIV (human immunodeficiency virus) test. STI (sexually transmitted infection) testing, if you are at risk. Lung cancer screening. Colorectal cancer screening. Diabetes screening. This is done by checking your blood sugar (glucose) after you have not eaten for a while (fasting). Mammogram. Talk with your health care provider about how often you should have regular mammograms. BRCA-related cancer screening. This may be done if you have a family history of breast, ovarian, tubal, or peritoneal cancers. Bone density scan. This is done to screen for osteoporosis. Talk with your health care provider about your test results, treatment options, and if necessary, the need for more tests. Follow these instructions at home: Eating and drinking  Eat a diet that includes fresh fruits and vegetables, whole grains, lean protein, and low-fat dairy products. Limit your intake of foods with high amounts of sugar, saturated fats, and salt. Take vitamin and mineral supplements as recommended by your health care provider. Do not drink alcohol if your health care provider tells you not to drink. If you drink alcohol: Limit how much you have to 0-1 drink a day. Know how much alcohol is in your drink. In the [place] one drink equals one 12 oz bottle of beer (355 mL), one 5 oz glass of wine (148 mL), or one 1 oz glass of hard liquor (44 mL). Lifestyle Brush your teeth every  morning and night with fluoride toothpaste. Floss one time each day. Exercise for at least 30 minutes 5 or more days each week. Do not use any products that contain nicotine or tobacco. These products include cigarettes, chewing tobacco, and vaping devices, such as e-cigarettes. If you need help quitting, ask your health care provider. Do not use drugs. If you are sexually active, practice safe sex. Use a condom or other form of protection in order to prevent STIs. Take aspirin  only as told by your health care provider. Make sure that you understand how much to take and what form to take. Work with your health care provider to find out whether it is safe and beneficial for you to take aspirin  daily. Ask your health care provider if you need to take a cholesterol-lowering medicine (statin). Find healthy ways to manage stress, such as: Meditation, yoga, or listening to music. Journaling. Talking to a trusted person. Spending time with friends and family. Minimize exposure to UV radiation to reduce your risk of skin cancer. Safety Always wear your seat belt while driving or riding in a vehicle. Do not drive: If you have been drinking alcohol. Do not ride with someone who has been drinking. When you are tired or distracted. While texting. If you have been using any mind-altering substances or drugs. Wear a helmet and other protective equipment during sports activities. If you have firearms in your house,  make sure you follow all gun safety procedures. What's next? Visit your health care provider once a year for an annual wellness visit. Ask your health care provider how often you should have your eyes and teeth checked. Stay up to date on all vaccines. This information is not intended to replace advice given to you by your health care provider. Make sure you discuss any questions you have with your health care provider. Document Revised: 03/20/2021 Document Reviewed: 03/20/2021 Elsevier  Patient Education  2024 [employer].

## 2024-06-24 NOTE — Assessment & Plan Note (Signed)
 Follows with cardiology.  Regular rate and rhythm today.  She is anticoagulated on Eliquis  and rate controlled with diltiazem  360 mg daily and metoprolol  tartrate 25 mg twice daily.  She is having intermittent dizziness and heart rate readings in the 40s.  She is also occasionally getting good extremity edema.  She will follow-up with cardiology next week to discuss medication management though she may need a lower dose of diltiazem .  She will discuss this with cardiology next week.

## 2024-06-24 NOTE — Assessment & Plan Note (Signed)
 Mildly elevated today.  She was recently started on metoprolol  tartrate 25 mg twice daily and is also on Cardizem  360 mg daily.  As above she is having some intermittent dizziness and likely needs to have her medications adjustment I will defer this to her cardiologist who she will be seeing next week.

## 2024-06-24 NOTE — Progress Notes (Signed)
 Chief Complaint:  Nancy Mccormick is a 89 y.o. female who presents today for her annual comprehensive physical exam.    Assessment/Plan:  New/Acute Problems: Cerumen impaction Successfully irrigated by RMA today. She can use over-the-counter Debrox as needed to prevent buildup.  Chronic Problems Addressed Today: Atrial fibrillation (HCC) Follows with cardiology.  Regular rate and rhythm today.  She is anticoagulated on Eliquis  and rate controlled with diltiazem  360 mg daily and metoprolol  tartrate 25 mg twice daily.  She is having intermittent dizziness and heart rate readings in the 40s.  She is also occasionally getting good extremity edema.  She will follow-up with cardiology next week to discuss medication management though she may need a lower dose of diltiazem .  She will discuss this with cardiology next week.  Dyslipidemia (high LDL; low HDL) Check lipids.  He is on Zetia  10 mg daily.  Hyperglycemia Check A1c.  Essential hypertension Mildly elevated today.  She was recently started on metoprolol  tartrate 25 mg twice daily and is also on Cardizem  360 mg daily.  As above she is having some intermittent dizziness and likely needs to have her medications adjustment I will defer this to her cardiologist who she will be seeing next week.  Preventative Healthcare: Check labs.  She will schedule mammogram soon.  Patient Counseling(The following topics were reviewed and/or handout was given):  -Nutrition: Stressed importance of moderation in sodium/caffeine intake, saturated fat and cholesterol, caloric balance, sufficient intake of fresh fruits, vegetables, and fiber.  -Stressed the importance of regular exercise.   -Substance Abuse: Discussed cessation/primary prevention of tobacco, alcohol, or other drug use; driving or other dangerous activities under the influence; availability of treatment for abuse.   -Injury prevention: Discussed safety belts, safety helmets, smoke detector,  smoking near bedding or upholstery.   -Sexuality: Discussed sexually transmitted diseases, partner selection, use of condoms, avoidance of unintended pregnancy and contraceptive alternatives.   -Dental health: Discussed importance of regular tooth brushing, flossing, and dental visits.  -Health maintenance and immunizations reviewed. Please refer to Health maintenance section.  Return to care in 1 year for next preventative visit.     Subjective:  HPI:  She has no acute complaints today. Patient is here today for her  annual physical.  See assessment / plan for status of chronic conditions.  Discussed the use of AI scribe software for clinical note transcription with the patient, who gave verbal consent to proceed.  History of Present Illness Nancy Mccormick is an 89 year old female with tachycardia and atrial fibrillation who presents for follow-up regarding her heart condition and medication management.  She has been experiencing tachycardia and is currently on metoprolol , prescribed by her cardiologist. She experiences side effects from metoprolol , including lethargy, dry mouth, and swelling, particularly in her feet. Her cardio machine has registered bradycardia and once indicated possible atrial fibrillation, which resolved after 30 minutes.  About a month ago, she visited the emergency room due to her heart condition. She has been working with a cardiologist who prescribed metoprolol  at a dose of 25 mg twice a day. She is concerned about reducing the dose due to previous dizzy spells and the risk of returning to her prior condition.  She has a history of atrial fibrillation and had a stroke about five years ago. She is currently on Eliquis  and requires a refill. She also experiences dizziness and fatigue, which she associates with her current medication regimen, including diltiazem , which was doubled during a hospital visit.  She reports  swelling in her feet, which she attributes to  the combination of metoprolol  and diltiazem . She inquires about non-medication strategies to manage the swelling, such as walking and reducing sodium intake.      06/24/2024   10:23 AM  Depression screen PHQ 2/9  Decreased Interest 0  Down, Depressed, Hopeless 0  PHQ - 2 Score 0    Health Maintenance Due  Topic Date Due   Mammogram  05/05/2024     ROS: Per HPI, otherwise a complete review of systems was negative.   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   Osteoarthritis of knee 01/25/2019   Pain in left knee 01/07/2019   Encounter for other specified aftercare 11/16/2018   Atrial fibrillation (HCC) 06/05/2018   Knee pain, right 05/20/2018   History of CVA (cerebrovascular accident) 08/30/2017   Tear of lateral meniscus of knee 07/03/2015   Osteopenia 04/19/2014   Arrhythmia 11/04/2013   Dyslipidemia (high LDL; low HDL) 09/10/2010   Essential hypertension 09/10/2010   Past Surgical History:  Procedure Laterality Date   CATARACT EXTRACTION     KNEE ARTHROSCOPY Right 07/04/2015   Procedure: RIGHT KNEE ARTHROSCOPY WITH MEDIAL MENISCAL DEBRIDEMENT AND CHONDROPLASTY;  Surgeon: Dempsey Moan, MD;  Location: [facility];  Service: Orthopedics;  Laterality: Right;   OPEN REDUCTION INTERNAL FIXATION (ORIF) DISTAL RADIAL FRACTURE Right 12/28/2012   Procedure: OPEN REDUCTION INTERNAL FIXATION (ORIF)  DISTAL RADIAL FRACTURE;  Surgeon: Maude KANDICE Herald, MD;  Location: [facility];  Service: Orthopedics;  Laterality: Right;   VAGINAL HYSTERECTOMY      Family History  Problem Relation Age of Onset   Stroke Mother 61   Anemia Mother    Heart disease Mother    Hearing loss Mother    Heart attack Mother    Hypertension Mother    Stroke Father 69   Early death Father    Hypertension Father    Stroke Sister 62   Diabetes Sister    Hypertension Sister    Hypertension Son     Medications- reviewed and updated Current Outpatient Medications  Medication Sig Dispense Refill   acetaminophen  (TYLENOL ) 500 MG tablet Take 1,000 mg by mouth every 6 (six) hours as needed for mild pain or moderate pain.     Calcium  Carbonate-Vitamin D  (CALCIUM  + D PO) Take 1 tablet by mouth at bedtime.     Cholecalciferol 50 MCG (2000 UT) TABS Take by mouth.     diltiazem  (CARDIZEM  CD) 360 MG 24 hr capsule Take 1 capsule (360 mg total) by mouth daily. 90 capsule 3   ezetimibe  (ZETIA ) 10 MG tablet TAKE 1 TABLET(10 MG) BY MOUTH DAILY 90 tablet 3   metoprolol  tartrate (LOPRESSOR ) 25 MG tablet Take 1 tablet (25 mg total) by mouth 2 (two) times daily. 180 tablet 3   Multiple Vitamin (MULTIVITAMIN WITH MINERALS)  TABS tablet Take 1 tablet by mouth at bedtime.     nitroGLYCERIN  (NITROSTAT ) 0.4 MG SL tablet Place 1 tablet (0.4 mg total) under the tongue as needed. 25 tablet 11   Saline (SIMPLY SALINE) 0.9 % AERS Place 2 each into the nose as directed. Use nightly for sinus hygiene long-term.  Can also be used as many times daily as desired to assist with clearing congested sinuses. 127 mL 11   apixaban  (ELIQUIS ) 5 MG TABS tablet Take 1 tablet (5 mg total) by mouth 2 (two) times daily. 180 tablet 1   No current facility-administered medications for this visit.    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 She has three sons and three grandsons.None live locally but she gets to talk with them often and sees them pretty regularly. 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  She enjoys having lunch out with friends regularly. She plans to move to a   Social Drivers of Corporate investment banker Strain: Low Risk  (03/09/2024)   Overall Financial Resource Strain (CARDIA)    Difficulty of Paying Living Expenses: Not hard at all  Food Insecurity: No Food Insecurity (03/09/2024)   Hunger Vital Sign    Worried About Running Out of Food in the Last Year: Never true    Ran Out of Food in the Last Year: Never true  Transportation Needs: No Transportation Needs (03/09/2024)   PRAPARE - Administrator, Civil Service (Medical): No    Lack of Transportation (Non-Medical): No  Physical Activity: Insufficiently Active (03/09/2024)   Exercise Vital Sign    Days of Exercise per Week: 4 days    Minutes of Exercise per Session: 30 min   Stress: No Stress Concern Present (03/09/2024)   [employer] of Occupational Health - Occupational Stress Questionnaire    Feeling of Stress : Not at all  Social Connections: Moderately Integrated (03/09/2024)   Social Connection and Isolation Panel    Frequency of Communication with Friends and Family: More than three times a week    Frequency of Social Gatherings with Friends and Family: More than three times a week    Attends Religious Services: More than 4 times per  year    Active Member of Clubs or Organizations: Yes    Attends Banker Meetings: More than 4 times per year    Marital Status: Widowed        Objective:  Physical Exam: BP (!) 140/70   Pulse (!) 46   Temp (!) 97.2 F (36.2 C) (Temporal)   Ht 5' 4 (1.626 m)   Wt 193 lb 12.8 oz (87.9 kg)   SpO2 94%   BMI 33.27 kg/m   Body mass index is 33.27 kg/m. Wt Readings from Last 3 Encounters:  06/24/24 193 lb 12.8 oz (87.9 kg)  05/27/24 187 lb (84.8 kg)  03/09/24 186 lb (84.4 kg)   Gen: NAD, resting comfortably HEENT: External auditory canal with obstructed cerumen bilaterally. OP clear. No thyromegaly noted.  CV: RRR with no murmurs appreciated Pulm: NWOB, CTAB with no crackles, wheezes, or rhonchi GI: Normal bowel sounds present. Soft, Nontender, Nondistended. MSK: no edema, cyanosis, or clubbing noted Skin: warm, dry Neuro: CN2-12 grossly intact. Strength 5/5 in upper and lower extremities. Reflexes symmetric and intact bilaterally.  Psych: Normal affect and thought content     Harleen Fineberg M. Kennyth, MD 06/24/2024 11:52 AM

## 2024-06-24 NOTE — Assessment & Plan Note (Signed)
 Check A1c.

## 2024-06-24 NOTE — Assessment & Plan Note (Signed)
Check lipids.  He is on Zetia 10 mg daily.

## 2024-06-27 ENCOUNTER — Ambulatory Visit: Payer: Self-pay | Admitting: Family Medicine

## 2024-06-27 DIAGNOSIS — D72819 Decreased white blood cell count, unspecified: Secondary | ICD-10-CM

## 2024-06-27 NOTE — Progress Notes (Signed)
 Her A1c is 6.6.  This is in the diabetic range.  Recommend she come back here soon to discuss treatment options for this.  Her cholesterol is overall better than last year.  She can continue with the Zetia  and continue working on diet and exercise and we can recheck in a year.  Her white blood cell count was mildly elevated at 11.3.  I do not think this is likely anything significant however we should recheck again in a month.  Please place future order for CBC with differential.

## 2024-06-28 ENCOUNTER — Telehealth: Payer: Self-pay | Admitting: Cardiology

## 2024-06-28 MED ORDER — METOPROLOL TARTRATE 25 MG PO TABS
12.5000 mg | ORAL_TABLET | Freq: Two times a day (BID) | ORAL | 3 refills | Status: DC
Start: 2024-06-28 — End: 2024-07-01

## 2024-06-28 NOTE — Telephone Encounter (Signed)
 Spoke to MD advised pt to decrease metoprolol  tartrate to 12.5 mg PO BID hold for HR < 60.  Check BP and HR  3 (days) per week call in log after 10 days.    Called pt advised of recommendation.  Pt expresses understanding.

## 2024-06-28 NOTE — Telephone Encounter (Signed)
 Pt called in stating she has been fatigue lately and trouble sleeping. She states she is having trouble doing daily routine. She asked if maybe she is having a reaction to some of her medications. Please advise.

## 2024-06-28 NOTE — Telephone Encounter (Signed)
 Called pt in regards to fatigue.  Pt reports fatigue started shortly after starting metoprolol  25 mg PO BID.  Reports can barely function d/t increased fatigue.  Pt doesn't check BP regularly; reports had an OV with PCP last week 06/24/24 140/70-46.  Pt reports HR ranges 52-60.  60 is the highest it has been since starting metoprolol .  Advised pt if HR less than 60 not to take metoprolol .  Will send message to MD to advise.

## 2024-06-29 ENCOUNTER — Telehealth: Payer: Self-pay | Admitting: Cardiology

## 2024-06-29 NOTE — Telephone Encounter (Signed)
  Pt c/o BP issue: STAT if pt c/o blurred vision, one-sided weakness or slurred speech.  STAT if BP is GREATER than 180/120 TODAY.  STAT if BP is LESS than 90/60 and SYMPTOMATIC TODAY  1. What is your BP concern? Was told to contact office with readings if it got high  2. Have you taken any BP medication today? yes  3. What are your last 5 BP readings?  163/71 152/70  4. Are you having any other symptoms (ex. Dizziness, headache, blurred vision, passed out)? no

## 2024-06-29 NOTE — Telephone Encounter (Signed)
 Patient reports that she called in yesterday c/o dizziness after starting lopressor  05/27/24. She had not been tracking her BP at that time, she was just calling about her symptoms. Dr. Lavona advised her to decrease her lopressor  to 12.5 mg BID yesterday.  Patient reports today she notes her BP is up as documented, but dizziness is improved. She denies any headache or blurry vision. Patient has f/u 10/3, but states she is very anxious about how her BP has trended up over the last few weeks which seems to correlate with readings in the chart. Moved appt with APP freom 10/3 to 9/26, asked patient to continue to keep track of BP readings and symptoms. Patient agrees to plan and verbalizes understanding.

## 2024-06-30 NOTE — Progress Notes (Signed)
 " Cardiology Office Note    Patient Name: Nancy Mccormick Date of Encounter: 06/30/2024  Primary Care Provider:  Kennyth Worth HERO, MD Primary Cardiologist:  Lynwood Schilling, MD Primary Electrophysiologist: None   Past Medical History    Past Medical History:  Diagnosis Date   Arthritis    back, left shoulder, and fingers   Atrial fibrillation (HCC)    History of chicken pox    Hyperlipidemia    Hypertension    Osteoporosis    Stroke [facility])     History of Present Illness  Nancy Mccormick is a 89 y.o. female with a PMH of paroxysmal AF (on Eliquis ), ischemic CVA 2018, HTN, HLD, moderate AI who presents today for 6-week follow-up.  [name] has been followed by Dr. Schilling since 2011 when she was diagnosed with atrial fibrillation.  She had a 2D echo completed in 2023 dyspnea and fatigue that showed EF of 60 to 65% with grade 1 DD.  LVEDP with mild MR and mild to moderate AR.  She was referred to structural heart and seen by Lamarr Hummer, PA on 12/2021 for replacement at that time.  She presented to the ED on 05/13/2024 with complaint of weakness, palpitations and shortness of breath.  She was evaluated by cardiology due to complaint of chest pain at rest.  She had troponins resulting negative x 2 and BNP was normal.  An event monitor 2 weeks prior that showed frequent PACs and had Cardizem  increased to 360 mg daily.  She was referred to EP to discuss ablation.  She was last seen by Dr. Schilling on 05/27/2024 for follow-up.  During her visit she was tachypneic and anxious with elevated BP of 170/70.  She was started on metoprolol  12.5 mg twice daily and continued on Cardizem  360 mg.  She contacted our office on 06/29/2024 with complaint of dizziness after starting metoprolol .  She was advised to lower dose to 12.5 mg twice daily.  She also noted BP elevated with resolution of dizziness with reduced dose of metoprolol .  Ms. Tschetter presents today with her son for complaint of dizziness and  lower extremity swelling. She experiences weakness and dizziness, which she attributes to fluctuations in her heart rate. Her heart rate was 42 this morning and it was 60 yesterday. Dizziness and difficulty breathing occur when her heart rate is fast. She also experiences weakness and dizziness when her heart rate is low. She has been using metoprolol , which she believes may be contributing to her symptoms. She has a history of tachycardia, which also caused similar symptoms, but without swelling. She reports swelling in her lower extremities, which started after beginning metoprolol . The swelling improves overnight but does not completely resolve. She describes the swelling as tight but not painful. She has not used compression socks before. She has a history of atrial fibrillation and was hospitalized about a month ago when her CardiaMobile device indicated possible AFib. An ambulance was called, and she was taken to the hospital, where her Cardizem  dose was doubled. She monitors her heart rate using a CardiaMobile device and a blood pressure monitor. She is currently on Eliquis  5 mg twice a day and has been on it for years without issues. She drinks at least three glasses of water a day and uses flavored water like Icy, but she doubts she reaches 64 ounces of fluid daily. She does not consume caffeine and rarely eats chocolate. No chest pain. Patient denies chest pain, PND, orthopnea, nausea, vomiting, dizziness, syncope, edema,  weight gain, or early satiety.  Discussed the use of AI scribe software for clinical note transcription with the patient, who gave verbal consent to proceed.  History of Present Illness   Review of Systems  Please see the history of present illness.    All other systems reviewed and are otherwise negative except as noted above.  Physical Exam    Wt Readings from Last 3 Encounters:  06/24/24 193 lb 12.8 oz (87.9 kg)  05/27/24 187 lb (84.8 kg)  03/09/24 186 lb (84.4 kg)    CD:Uyzmz were no vitals filed for this visit.,There is no height or weight on file to calculate BMI. GEN: Well nourished, well developed in no acute distress Neck: No JVD; No carotid bruits Pulmonary: Clear to auscultation without rales, wheezing or rhonchi  Cardiovascular: Normal rate. Regular rhythm. Normal S1. Normal S2.   Murmurs: There is no murmur.  ABDOMEN: Soft, non-tender, non-distended EXTREMITIES: Bilateral +2 pitting edema  EKG/LABS/ Recent Cardiac Studies   ECG personally reviewed by me today -none completed today  Risk Assessment/Calculations:    CHA2DS2-VASc Score = 7   This indicates a 11.2% annual risk of stroke. The patient's score is based upon: CHF History: 0 HTN History: 1 Diabetes History: 0 Stroke History: 2 Vascular Disease History: 1 Age Score: 2 Gender Score: 1         Lab Results  Component Value Date   WBC 11.3 (H) 06/24/2024   HGB 13.3 06/24/2024   HCT 40.3 06/24/2024   MCV 90.8 06/24/2024   PLT 330.0 06/24/2024   Lab Results  Component Value Date   CREATININE 0.85 06/24/2024   BUN 20 06/24/2024   NA 143 06/24/2024   K 4.8 06/24/2024   CL 106 06/24/2024   CO2 29 06/24/2024   Lab Results  Component Value Date   CHOL 195 06/24/2024   HDL 57.80 06/24/2024   LDLCALC 109 (H) 06/24/2024   TRIG 145.0 06/24/2024   CHOLHDL 3 06/24/2024    Lab Results  Component Value Date   HGBA1C 6.6 (H) 06/24/2024   Assessment & Plan    Assessment & Plan  1.  Paroxysmal AF: - Recent event monitor completed 04/2024 tachycardia - Patient is in sinus rhythm by auscultation with rate of 59 bpm. - Continue rate control with Cardizem  360 mg daily - Taper metoprolol : skip tomorrow's dose, take half dose on Sunday and Monday, then stop. - Prescribe PRN Cardizem  30 mg for heart rate above 120-150 bpm. - Ensure hydration with at least 64 ounces of fluid daily. - Avoid caffeine, alcohol, and stress. - Monitor heart rate with CardiaMobile -Continue  Eliquis  5 mg twice daily  2.  Essential HTN: - Patient reports BP was stable at 132/74 - Continue Cardizem  360 mg daily  3.  History of CVA: - Remote ischemic MCA CVA in 2018 in the setting of atrial fibrillation - Continue current GDMT with Eliquis  5 mg twice daily Zetia  10 mg daily  4.  Dizziness: - Previous complaint of dizziness after starting metoprolol  25 mg twice daily with reduction of dose to 12.5 mg twice daily -Today patient reports ongoing dizziness since beginning metoprolol . - Will taper off metoprolol  and discontinue and patient instructed to contact our office if dizziness persists.  5. Lower extremity edema Edema possibly due to venous insufficiency or reduced cardiac output, with partial overnight improvement. - Discontinue metoprolol  to assess impact on edema. - Use compression socks during the day. - Elevate feet when sitting. - Monitor salt  intake and avoid high-sodium foods. - Consider diuretics if edema persists and is bothersome.  Disposition: Follow-up with Lynwood Schilling, MD or APP in 3 months    Signed, Wyn Raddle, Jackee Shove, NP 06/30/2024, 6:50 PM [facility] "

## 2024-07-01 ENCOUNTER — Encounter: Payer: Self-pay | Admitting: Nurse Practitioner

## 2024-07-01 ENCOUNTER — Ambulatory Visit: Attending: Nurse Practitioner | Admitting: Nurse Practitioner

## 2024-07-01 VITALS — BP 132/74 | HR 59 | Ht 64.0 in | Wt 195.6 lb

## 2024-07-01 DIAGNOSIS — I10 Essential (primary) hypertension: Secondary | ICD-10-CM

## 2024-07-01 DIAGNOSIS — E785 Hyperlipidemia, unspecified: Secondary | ICD-10-CM | POA: Diagnosis not present

## 2024-07-01 DIAGNOSIS — Z8673 Personal history of transient ischemic attack (TIA), and cerebral infarction without residual deficits: Secondary | ICD-10-CM

## 2024-07-01 DIAGNOSIS — R600 Localized edema: Secondary | ICD-10-CM

## 2024-07-01 DIAGNOSIS — R42 Dizziness and giddiness: Secondary | ICD-10-CM

## 2024-07-01 DIAGNOSIS — I480 Paroxysmal atrial fibrillation: Secondary | ICD-10-CM | POA: Diagnosis not present

## 2024-07-01 MED ORDER — METOPROLOL TARTRATE 25 MG PO TABS: ORAL_TABLET | ORAL | Status: DC

## 2024-07-01 MED ORDER — DILTIAZEM HCL 30 MG PO TABS: 30.0000 mg | ORAL_TABLET | Freq: Every day | ORAL | 0 refills | Status: DC | PRN

## 2024-07-01 NOTE — Patient Instructions (Addendum)
 Medication Instructions:  START Cardizem  30mg  Take 1 tablet daily as needed if heart rate is over 120  Taper off of Metoprolol  None tonight Tomorrow 12.5mg  at night Sunday 12.5mg  at night  None Monday  12.5mg  on Tuesday night  Then STOP medication all together *If you need a refill on your cardiac medications before your next appointment, please call your pharmacy*  Lab Work: None ordered If you have labs (blood work) drawn today and your tests are completely normal, you will receive your results only by: MyChart Message (if you have MyChart) OR A paper copy in the mail If you have any lab test that is abnormal or we need to change your treatment, we will call you to review the results.  Testing/Procedures: None ordered  Follow-Up: At [facility], you and your health needs are our priority.  As part of our continuing mission to provide you with exceptional heart care, our providers are all part of one team.  This team includes your primary Cardiologist (physician) and Advanced Practice Providers or APPs (Physician Assistants and Nurse Practitioners) who all work together to provide you with the care you need, when you need it.  Your next appointment:   3 month(s)  Provider:   Lynwood Schilling, MD or APP   We recommend signing up for the patient portal called MyChart.  Sign up information is provided on this After Visit Summary.  MyChart is used to connect with patients for Virtual Visits (Telemedicine).  Patients are able to view lab/test results, encounter notes, upcoming appointments, etc.  Non-urgent messages can be sent to your provider as well.   To learn more about what you can do with MyChart, go to [email or web address].   Other Instructions Please get some ted hose or compression stockings. They can be purchased at your local medical supply store, [employer], [employer] or [employer] fundraiser.  Put them on first thing in the morning and wear them during the day .  Elevate your feet during the day and remove hose in the evening before bed.  Drink 64 ounces of water or fluids a day

## 2024-07-04 ENCOUNTER — Ambulatory Visit: Payer: Self-pay

## 2024-07-04 NOTE — Telephone Encounter (Signed)
 Noted pt scheduled for OV with PCP

## 2024-07-04 NOTE — Telephone Encounter (Signed)
 FYI Only or Action Required?: Action required by provider: update on patient condition.  Patient was last seen in primary care on 06/24/2024 by Kennyth Worth HERO, MD.  Called Nurse Triage reporting Dizziness, Fatigue, and Results.  Symptoms began about a month ago.  Interventions attempted: Prescription medications: weaning off metoprolol .  Symptoms are: unchanged.  Triage Disposition: See PCP Within 2 Weeks  Patient/caregiver understands and will follow disposition?: Yes       Copied from CRM [id number]. Topic: Clinical - Red Word Triage >> Jul 04, 2024  9:33 AM Suzen RAMAN wrote: Red Word that prompted transfer to Nurse Triage: swelling,dizziness and fatigue Patient called initially to discuss lab results and provider notations were provided for labs drawn on 06/24/24. Per chart notations from provider recommends follow up discuss treatment options for diabetes. Reason for Disposition  Palpitations are a chronic symptom (recurrent or ongoing AND present > 4 weeks)    Pt initially calling to review lab results, but mentioned HR issues so PAS transferred to NT for further triage.   Pt reports has had ongoing HR issues x a few weeks and is currently following Cardiology for medication management. Pt reports had tachycardia episode that prompted cards to increase diltiazem  and add metoprolol . Pt started experiencing intermittent dizziness, that required further medication management and is currently weaning off of metoprolol , with last dose tomorrow.  Triager strongly advised close monitoring of worsening sx/HR < 50. Patient verbalized understanding and to call back with worsening symptoms.  Answer Assessment - Initial Assessment Questions 1. DESCRIPTION: Please describe your heart rate or heartbeat that you are having (e.g., fast/slow, regular/irregular, skipped or extra beats, palpitations)     slow 2. ONSET: When did it start? (e.g., minutes, hours, days)      A week 3.  DURATION: How long does it last (e.g., seconds, minutes, hours)      4. PATTERN Does it come and go, or has it been constant since it started?  Does it get worse with exertion?   Are you feeling it now?     Comes and goes 5. TAP: Using your hand, can you tap out what you are feeling on a chair or table in front of you, so that I can hear? Note: Not all patients can do this.       Has a HR monitor 6. HEART RATE: Can you tell me your heart rate? How many beats in 15 seconds?  Note: Not all patients can do this.       55 this AM DBP 70-71 7. RECURRENT SYMPTOM: Have you ever had this before? If Yes, ask: When was the last time? and What happened that time?      Yes--has had ongoing issues with HR/tachycardia. Has been following cardiology and is currently weaning off metoprolol --last day is tomorrow. Pt reports that sx are the same from last visit on 9/19 with PCP. Triager reviewed care advise and strongly recommended further evaluation for any worsening sx/HR < 50. Parent Patient verbalized understanding and to call back with worsening symptoms.  8. CAUSE: What do you think is causing the palpitations?     bradycardia 9. CARDIAC HISTORY: Do you have any history of heart disease? (e.g., heart attack, angina, bypass surgery, angioplasty, arrhythmia)      Afib, tachycardia 10. OTHER SYMPTOMS: Do you have any other symptoms? (e.g., dizziness, chest pain, sweating, difficulty breathing)       dizziness 11. PREGNANCY: Is there any chance you are pregnant? When was your  last menstrual period?       N/a  Answer Assessment - Initial Assessment Questions 1. REASON FOR CALL: What is the main reason for your call? or How can I best help you?     Pt wanted further review of lab results from 9/19. Triager reviewed PCP note: Worth CHRISTELLA Kitty, MD 06/27/2024 12:39 PM EDT Back to Top  Her A1c is 6.6.  This is in the diabetic range.  Recommend she come back here soon to discuss  treatment options for this.   Her cholesterol is overall better than last year.  She can continue with the Zetia  and continue working on diet and exercise and we can recheck in a year.   Her white blood cell count was mildly elevated at 11.3.  I do not think this is likely anything significant however we should recheck again in a month.  Please place future order for CBC with differential.   2. SYMPTOMS : Do you have any symptoms?      Fatigue/dizziness 3. OTHER QUESTIONS: Do you have any other questions?     Pt would like to discuss diet options for A1C treatment. Triager scheduled F/U visit with PCP to further discuss.  Answer Assessment - Initial Assessment Questions 1. DESCRIPTION: Describe your dizziness.      Recent PCP visit aware - endorses having tachycardia and doubled diltaziem, then heart rate was too slow to 40s 2. LIGHTHEADED: Do you feel lightheaded? (e.g., somewhat faint, woozy, weak upon standing)     *No Answer* 3. VERTIGO: Do you feel like either you or the room is spinning or tilting? (i.e., vertigo)     *No Answer* 4. SEVERITY: How bad is it?  Do you feel like you are going to faint? Can you stand and walk?     *No Answer* 5. ONSET:  When did the dizziness begin?     *No Answer* 6. AGGRAVATING FACTORS: Does anything make it worse? (e.g., standing, change in head position)     *No Answer* 7. HEART RATE: Can you tell me your heart rate? How many beats in 15 seconds?  (Note: Not all patients can do this.)       *No Answer* 8. CAUSE: What do you think is causing the dizziness? (e.g., decreased fluids or food, diarrhea, emotional distress, heat exposure, new medicine, sudden standing, vomiting; unknown)     *No Answer* 9. RECURRENT SYMPTOM: Have you had dizziness before? If Yes, ask: When was the last time? What happened that time?     *No Answer* 10. OTHER SYMPTOMS: Do you have any other symptoms? (e.g., fever, chest pain, vomiting,  diarrhea, bleeding)       *No Answer* 11. PREGNANCY: Is there any chance you are pregnant? When was your last menstrual period?       *No Answer*  Protocols used: Dizziness - Lightheadedness-A-AH, Heart Rate and Heartbeat Questions-A-AH, Information Only Call - No Triage-A-AH

## 2024-07-08 ENCOUNTER — Ambulatory Visit (INDEPENDENT_AMBULATORY_CARE_PROVIDER_SITE_OTHER): Admitting: Family Medicine

## 2024-07-08 ENCOUNTER — Ambulatory Visit: Admitting: Physician Assistant

## 2024-07-08 VITALS — BP 146/90 | HR 64 | Temp 97.8°F | Ht 64.0 in | Wt 191.8 lb

## 2024-07-08 DIAGNOSIS — I10 Essential (primary) hypertension: Secondary | ICD-10-CM

## 2024-07-08 DIAGNOSIS — R739 Hyperglycemia, unspecified: Secondary | ICD-10-CM

## 2024-07-08 NOTE — Progress Notes (Signed)
   Nancy Mccormick is a 89 y.o. female who presents today for an office visit.  Assessment/Plan:  Chronic Problems Addressed Today: Hyperglycemia Had lengthy discussion with patient regarding most recent A1c is 6.6.  She would be a good candidate for GLP agonist due to her comorbidities however she is still having some lingering dizziness and shortness of breath due to her blood pressure medications.  She would also like to work on diet and exercise before starting an additional medication at this point.  She will follow-up with us  in 3 to 4 weeks.  If she is doing well at that time we will likely start Mounjaro.  Essential hypertension Blood pressure is mildly elevated today.  She was recently seen by cardiology and they discontinued her metoprolol  due to symptomatic bradycardia.  This does seem to be improving though did discuss with patient it may take a few weeks before her blood pressure or heart rates settle.  She is still on Cardizem  360 mg daily.  She will follow-up with us  in a few weeks.  If symptoms are still persistent at that time would consider decreasing dose of diltiazem  or having her follow-up with cardiology.     Subjective:  HPI:  See assessment / plan for status of chronic conditions.   Discussed the use of AI scribe software for clinical note transcription with the patient, who gave verbal consent to proceed.  History of Present Illness Nancy Mccormick is an 89 year old female with tachycardia and hypertension who presents with medication-related side effects and concerns about her A1c levels.  She has been experiencing significant side effects from metoprolol , including bradycardia with a heart rate dropping to 42 bpm. She was weaned off metoprolol  over a week, with her last dose taken on Tuesday. Her heart rate has since increased to 57 bpm. She continues to take diltiazem  at a dose of 350 mg daily. 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  A recent emergency room visit due to tachycardia led to adjustments in her medication regimen. Her blood pressure has been elevated, with readings of 142/94 mmHg. She is concerned about her current symptoms and their impact on her daily activities.  Her A1c level was reported as 6.6. She prefers to manage her condition through dietary changes, specifically by reducing sweets and carbohydrates. She is considering the Mediterranean diet to help with weight loss and improve her heart health.         Objective:  Physical Exam: BP (!) 146/90   Pulse 64   Temp 97.8 F (36.6 C) (Oral)   Ht 5' 4 (1.626 m)   Wt 191 lb 12.8 oz (87 kg)   SpO2 94%   BMI 32.92 kg/m   Gen: No acute distress, resting comfortably CV: Regular rate and rhythm with no murmurs appreciated Pulm: Normal work of breathing, clear to auscultation bilaterally with no crackles, wheezes, or rhonchi Neuro: Grossly normal, moves all extremities Psych: Normal affect and thought content      Mialynn Shelvin M. Kennyth, MD 07/08/2024 9:32 AM

## 2024-07-08 NOTE — Assessment & Plan Note (Signed)
 Had lengthy discussion with patient regarding most recent A1c is 6.6.  She would be a good candidate for GLP agonist due to her comorbidities however she is still having some lingering dizziness and shortness of breath due to her blood pressure medications.  She would also like to work on diet and exercise before starting an additional medication at this point.  She will follow-up with us  in 3 to 4 weeks.  If she is doing well at that time we will likely start Mounjaro.

## 2024-07-08 NOTE — Assessment & Plan Note (Signed)
 Blood pressure is mildly elevated today.  She was recently seen by cardiology and they discontinued her metoprolol  due to symptomatic bradycardia.  This does seem to be improving though did discuss with patient it may take a few weeks before her blood pressure or heart rates settle.  She is still on Cardizem  360 mg daily.  She will follow-up with us  in a few weeks.  If symptoms are still persistent at that time would consider decreasing dose of diltiazem  or having her follow-up with cardiology.

## 2024-07-08 NOTE — Patient Instructions (Signed)
 It was very nice to see you today!  VISIT SUMMARY: Today, we discussed your recent medication-related side effects, your elevated A1c levels, and your concerns about hypertension and tachycardia. We made adjustments to your medication regimen and discussed lifestyle changes to help manage your conditions.  YOUR PLAN:  FATIGUE, WEAKNESS, SHORTNESS OF BREATH, AND DIZZINESS: These symptoms are likely related to recent medication changes and the discontinuation of metoprolol . -Monitor your symptoms over the next few weeks. -Contact us  if your symptoms do not improve or worsen.  HYPERTENSION: Your blood pressure has been slightly elevated after stopping metoprolol . -Monitor your blood pressure. -We will reassess if it remains elevated in a few weeks.  TYPE 2 DIABETES MELLITUS: Your A1c level is elevated at 6.6%. -Focus on lifestyle changes to manage your blood sugar levels. -Reduce your intake of sweets and carbohydrates. -Consider following a Mediterranean diet. -Recheck your A1c in a few months. -Contact us  in a few weeks if you are interested in starting Mounjaro for weight loss and diabetes management.  OVERWEIGHT: We discussed weight loss strategies to help manage your health conditions. -Implement the Mediterranean diet. -Consider Mounjaro for weight loss and diabetes management after your current symptoms stabilize. -Contact us  in a few weeks if you are interested in starting Mounjaro.  Return in about 4 weeks (around 08/05/2024) for Follow Up.   Take care, Dr Kennyth  PLEASE NOTE:  If you had any lab tests, please let us  know if you have not heard back within a few days. You may see your results on mychart before we have a chance to review them but we will give you a call once they are reviewed by us .   If we ordered any referrals today, please let us  know if you have not heard from their office within the next week.   If you had any urgent prescriptions sent in today, please  check with the pharmacy within an hour of our visit to make sure the prescription was transmitted appropriately.   Please try these tips to maintain a healthy lifestyle:  Eat at least 3 REAL meals and 1-2 snacks per day.  Aim for no more than 5 hours between eating.  If you eat breakfast, please do so within one hour of getting up.   Each meal should contain half fruits/vegetables, one quarter protein, and one quarter carbs (no bigger than a computer mouse)  Cut down on sweet beverages. This includes juice, soda, and sweet tea.   Drink at least 1 glass of water with each meal and aim for at least 8 glasses per day  Exercise at least 150 minutes every week.

## 2024-07-21 ENCOUNTER — Telehealth: Payer: Self-pay | Admitting: Cardiology

## 2024-07-21 NOTE — Telephone Encounter (Signed)
 STAT if HR is under 50 or over 120  (normal HR is 60-100 beats per minute)  What is your heart rate? 155  Do you have a log of your heart rate readings (document readings)? no  Do you have any other symptoms? No   Pt stated she went into Tachycardia last night from 10-11 pm and she took an extra 30 mg of diltiazem  and wanted to make sure that was okay. Please Advise

## 2024-07-21 NOTE — Telephone Encounter (Signed)
 S/w the patient and she took the Diltiazem  around 11 pm last night. Informed her that she can Take 1 tablet (30 mg total) by mouth daily as needed (heart rate over 120)  She reports that her hr is better now. Her hr is 60 now.   She reports that she was recently taken off of the Metoprolol  and over the last couple of days, her hr has been beating a little faster. Last night was the first time she had to take any Diltiazem  for it.  She denies any shortness of breath or dizziness. She does feel a little fatigued.   Informed her that I would give this information to Dr Lavona to see if he has any further recommendations, or if he just wants her to continue monitoring and using Medication prn.  Given ER precautions. She verbalized understanding.

## 2024-07-22 NOTE — Telephone Encounter (Signed)
 LVMTCB 10/17

## 2024-07-25 NOTE — Telephone Encounter (Signed)
 Called pt, relayed Dr. [place] message. She verbalized understanding. No further questions at this time.

## 2024-07-25 NOTE — Telephone Encounter (Signed)
 Left voice message to call back 10/20

## 2024-07-25 NOTE — Telephone Encounter (Signed)
 Patient is returning call.

## 2024-08-03 ENCOUNTER — Other Ambulatory Visit: Payer: Self-pay | Admitting: Family Medicine

## 2024-08-03 DIAGNOSIS — Z1231 Encounter for screening mammogram for malignant neoplasm of breast: Secondary | ICD-10-CM

## 2024-08-05 ENCOUNTER — Ambulatory Visit (INDEPENDENT_AMBULATORY_CARE_PROVIDER_SITE_OTHER): Admitting: Family Medicine

## 2024-08-05 VITALS — BP 122/68 | HR 69 | Temp 98.1°F | Resp 12 | Ht 64.0 in | Wt 183.4 lb

## 2024-08-05 DIAGNOSIS — I10 Essential (primary) hypertension: Secondary | ICD-10-CM | POA: Diagnosis not present

## 2024-08-05 DIAGNOSIS — I4891 Unspecified atrial fibrillation: Secondary | ICD-10-CM

## 2024-08-05 DIAGNOSIS — R739 Hyperglycemia, unspecified: Secondary | ICD-10-CM

## 2024-08-05 NOTE — Progress Notes (Signed)
   Nancy Mccormick is a 89 y.o. female who presents today for an office visit.  Assessment/Plan:  Chronic Problems Addressed Today: No problem-specific Assessment & Plan notes found for this encounter.     Subjective:  HPI:  See assessment / plan for status of chronic conditions.  She is here today for follow-up.  Saw her 4 weeks ago.  Discussed most recent A1c of 6.6 at that time and did discuss GLP agonist however she elected to work on lifestyle inventions.  She has been doing well with this.  She also is having some issues with elevated heart rate and blood pressure readings at her most recent office visit on diltiazem  360 mg daily.  We did discuss that we will take several weeks before her readings would settle out due to her recent medication changes over the last couple of months.  She has done well the last several weeks and has only needed to take an extra dose of diltiazem  once.       Objective:  Physical Exam: BP 122/68   Pulse 69   Temp 98.1 F (36.7 C) (Temporal)   Resp 12   Ht 5' 4 (1.626 m)   Wt 183 lb 6.4 oz (83.2 kg)   SpO2 95%   BMI 31.48 kg/m   Wt Readings from Last 3 Encounters:  08/05/24 183 lb 6.4 oz (83.2 kg)  07/08/24 191 lb 12.8 oz (87 kg)  07/01/24 195 lb 9.6 oz (88.7 kg)    Gen: No acute distress, resting comfortably CV: Regular rate and rhythm with no murmurs appreciated Pulm: Normal work of breathing, clear to auscultation bilaterally with no crackles, wheezes, or rhonchi Neuro: Grossly normal, moves all extremities Psych: Normal affect and thought content      Lailani Tool M. Kennyth, MD 08/05/2024 9:07 AM

## 2024-08-05 NOTE — Patient Instructions (Signed)
 It was very nice to see you today!  VISIT SUMMARY: Today, we discussed your medication management and addressed your concerns about potential tachycardia with an Ozempic-like medication. We also reviewed your recent weight loss and its positive impact on your blood sugar levels and blood pressure.  YOUR PLAN: TACHYCARDIA: You experienced an episode of increased heart rate to 155 beats per minute, which was managed with cortisone. -Monitor your heart rate and report any new episodes of rapid heart rate. -Continue to avoid medications that may increase your heart rate.  HYPERGLYCEMIA: Your blood sugar levels have improved with lifestyle changes and an 8-pound weight loss. -Recheck your A1c in six months unless you experience worsening symptoms. -Continue with your current lifestyle changes to manage your blood sugar levels.  ESSENTIAL HYPERTENSION: Your blood pressure is well-controlled with lifestyle changes. -Continue with your current lifestyle changes to maintain good blood pressure control.  Return in about 6 months (around 02/02/2025) for Follow Up.   Take care, Dr Kennyth  PLEASE NOTE:  If you had any lab tests, please let us  know if you have not heard back within a few days. You may see your results on mychart before we have a chance to review them but we will give you a call once they are reviewed by us .   If we ordered any referrals today, please let us  know if you have not heard from their office within the next week.   If you had any urgent prescriptions sent in today, please check with the pharmacy within an hour of our visit to make sure the prescription was transmitted appropriately.   Please try these tips to maintain a healthy lifestyle:  Eat at least 3 REAL meals and 1-2 snacks per day.  Aim for no more than 5 hours between eating.  If you eat breakfast, please do so within one hour of getting up.   Each meal should contain half fruits/vegetables, one quarter protein,  and one quarter carbs (no bigger than a computer mouse)  Cut down on sweet beverages. This includes juice, soda, and sweet tea.   Drink at least 1 glass of water with each meal and aim for at least 8 glasses per day  Exercise at least 150 minutes every week.

## 2024-08-05 NOTE — Assessment & Plan Note (Signed)
 Following with cardiology.  Regular rate and rhythm today.  Doing well with diltiazem  360 mg daily.  She is anticoagulated on Eliquis .

## 2024-08-05 NOTE — Assessment & Plan Note (Signed)
 Blood pressure is at goal today.  She has done well in the last several weeks on Cardizem  360 mg daily.  She will continue with her current regimen now follow-up with cardiology as previously planned.

## 2024-08-05 NOTE — Assessment & Plan Note (Signed)
 Working on lifestyle interventions.  Most recent A1c 6.6 several weeks ago.  We did discuss trial of Mounjaro she is her most recent office visit however she would like to hold off on this for now.  I believe this is reasonable given the good results she has had thus far with lifestyle interventions.  Will recheck A1c in 6 months.

## 2024-08-10 ENCOUNTER — Ambulatory Visit
Admission: RE | Admit: 2024-08-10 | Discharge: 2024-08-10 | Disposition: A | Source: Ambulatory Visit | Attending: Family Medicine | Admitting: Family Medicine

## 2024-08-10 DIAGNOSIS — Z1231 Encounter for screening mammogram for malignant neoplasm of breast: Secondary | ICD-10-CM

## 2024-08-15 ENCOUNTER — Other Ambulatory Visit: Payer: Self-pay | Admitting: Family Medicine

## 2024-08-15 DIAGNOSIS — R928 Other abnormal and inconclusive findings on diagnostic imaging of breast: Secondary | ICD-10-CM

## 2024-08-16 ENCOUNTER — Ambulatory Visit: Payer: Self-pay

## 2024-08-16 NOTE — Telephone Encounter (Signed)
 FYI Only or Action Required?: FYI only for provider: home care.  Patient was last seen in primary care on 08/05/2024 by Kennyth Worth HERO, MD.  Called Nurse Triage reporting Fatigue and Nasal Congestion.  Symptoms began 2 days ago.  Interventions attempted: Rest, hydration, or home remedies.  Symptoms are: stable.  Triage Disposition: Home Care  Patient/caregiver understands and will follow disposition?: Yes Reason for Disposition  Common cold with no complications  Answer Assessment - Initial Assessment Questions Patient was told she was exposed to Covid on Friday. Patient denies other symptoms aside from feeling really weak, a little dizzy and runny nose / sneezing. Advised patient to monitor symptoms at home, rest hydrate, OTC medications.   1. ONSET: When did the nasal discharge start?      Sunday felt tired and wanted to sleep all day, started with cold like symptoms on Monday  2. AMOUNT: How much discharge is there?      Denies  3. COUGH: Do you have a cough? If Yes, ask: Describe the color of your mucus. (e.g., clear, white, yellow, green)     Denies  4. RESPIRATORY DISTRESS: Describe your breathing.      Denies  5. FEVER: Do you have a fever? If Yes, ask: What is your temperature, how was it measured, and when did it start?     Denies, has not taken temp but does not feel feverish  6. OTHER SYMPTOMS: Do you have any other symptoms? (e.g., earache, mouth sores, sore throat, wheezing)     Denies  Protocols used: Common Cold-A-AH  Copied from CRM #[id number]. Topic: Clinical - Medical Advice >> Aug 16, 2024  8:40 AM Ahlexyia S wrote: Reason for CRM: Pt called in stating that she thinks she has Covid. Pt mentioned that she was around someone that has Covid. Pt is experiencing cold symptoms and mild weakness. Offered to schedule an appointment but pt declined and stated that she doesn't really want to come in. Pt requested a callback for this.

## 2024-09-08 ENCOUNTER — Encounter

## 2024-09-11 NOTE — Progress Notes (Deleted)
 Cardiology Office Note:   Date:  09/11/2024  ID:  Nancy Mccormick, DOB Jan 04, 1934, MRN [id number] PCP: Kennyth Worth HERO, MD  [facility] Providers Cardiologist:  Lynwood Schilling, MD {  History of Present Illness:   Nancy Mccormick is a 89 y.o. female who presents for followup of atrial fibrillation she was admitted in Nov 2018 with a small MCA infarct and was in atrial fib.  She was in the hospital in August of 2019.  She had atrial fib with RVR which converted spontaneously to NSR.     She was in the emergency room in March 2023 with shortness of breath.    There was no clear cardiac etiology.  She followed up with continued SOB and profound fatigue.  She has had an extensive work up to include echo with only mild AI.  There was a normal BNP.  She had a negative perfusion study and has had lab work with no anemia and a normal TSH.  In the ED CXR was unremarkable.  There was no PE on CT.  She had a head CT and had no acute findings. She has been found to have atrial fib.     Since I last saw her she had palpitations.   There were no sustained arrhythmias on a monitor without atrial fib. ***   *** she had palpitations in July and I ordered a monitor. She had NSR.  She had frequent runs of SVT with the longest lasting 3 minutes.    She was in the ED in early August 2025 with palpitations.  Her wearable at home suggested atrial fibrillation.  I have reviewed these and she has had some episodes of SVT but looks to be regular and not prolonged.  It is consistent with what we saw on the recent monitor.  I did not see atrial fibrillation.  She does have ventricular ectopy.  In the emergency room which I reviewed for this visit her BNP was normal.  Troponin was negative x 2.  There were no objective findings.   She comes in today and she is acutely short of breath though her oxygen  saturations are excellent.  She is tachypneic.  She is anxious.  Her blood pressure is as below.  She is not having any  chest pressure.  She is not having any recent PND or orthopnea.  She has had no     ROS: ***  Studies Reviewed:    EKG:       ***  Risk Assessment/Calculations:   {Does this patient have ATRIAL FIBRILLATION?[phone]} No BP recorded.  {Refresh Note OR Click here to enter BP  :1}***        Physical Exam:   VS:  There were no vitals taken for this visit.   Wt Readings from Last 3 Encounters:  08/05/24 183 lb 6.4 oz (83.2 kg)  07/08/24 191 lb 12.8 oz (87 kg)  07/01/24 195 lb 9.6 oz (88.7 kg)     GEN: Well nourished, well developed in no acute distress NECK: No JVD; No carotid bruits CARDIAC: ***RR, *** murmurs, rubs, gallops RESPIRATORY:  Clear to auscultation without rales, wheezing or rhonchi  ABDOMEN: Soft, non-tender, non-distended EXTREMITIES:  No edema; No deformity   ASSESSMENT AND PLAN:   ATRIAL FIBRILLATION:   ***  I do not see any paroxysms of atrial fibrillation.  She tolerates anticoagulation.  I will be making the changes below.   HTN:   The blood pressure is ***  elevated.  I will be managing this with the addition of beta-blocker as below.  She can keep an eye on this at home.     DYSLIPIDEMIA:    LDL is *** elevated but she has not bee tolerant of statins.   She will continue with Zetia  and I will defer follow-up to Dr. Kennyth.     PALPITATIONS: ***  I am going to add a low-dose of beta-blocker to see if this does not improve her symptomatically.  Given the recent negative findings on her ER visits and the monitor and not suggesting any other studies.  She certainly should come back with any acute symptoms.     Follow up ***  Signed, Lynwood Schilling, MD

## 2024-09-12 ENCOUNTER — Ambulatory Visit: Admitting: Cardiology

## 2024-09-12 DIAGNOSIS — E785 Hyperlipidemia, unspecified: Secondary | ICD-10-CM

## 2024-09-12 DIAGNOSIS — I480 Paroxysmal atrial fibrillation: Secondary | ICD-10-CM

## 2024-09-12 DIAGNOSIS — R002 Palpitations: Secondary | ICD-10-CM

## 2024-09-16 ENCOUNTER — Telehealth: Payer: Self-pay | Admitting: Cardiology

## 2024-09-16 NOTE — Telephone Encounter (Signed)
 Jake,  In summary - due to elevated HR patient took her regular Cardizem  360mg  po x 1 (evening of 09/15/24), took Cardizem  short acting 30mg  x1 at night of 09/15/2024, and another dose of 30mg  an hour later.   This morning at 4am she took Cardizem  360 mg po x1.   Recommendation:  NO more Cardizem  today.  Restart daily Cardizem  360mg  po QAM morning of 09/17/2024 She has short acting Cardizem  that she uses on prn - but recommendation is to use them 6 hrs apart as long as SBP >120 mmHg and if she uses two tabs back to back 6 hrs apart she should be evaluated either in office or ER.   She has an appt to see you on 09/20/2024.  Please investigate how much Cardizem  she is taking - I suspect she may be taking her daily max dose.   Thanks everyone   Keyspan

## 2024-09-16 NOTE — Telephone Encounter (Signed)
 Spoke with pt. Pt states last night her HR was over 140 so she took her PRN 30mg  cardizem . After an hour she took another 30mg  dose . Then at 4am this morning she took a extra of her cardizem  360mg . Pt states she normally takes her daily cardizem  at night. Had pt take BP/HR.  BP 108/63 HR 83 Pt denies any symptoms. Pt states she has been having elevated HR at least once a week. Pt had appt with Dr Lavona Monday but canceled due to weather. Scheduled pt for 09/20/24 with Dr Lavona. Advised pt of ED/911 precautions in case of symptoms or low HR BP.  Advised patient I would send to Dr Lavona for review. Will also send to DOD Pecola) to review. Will forward over and alert covering of message.

## 2024-09-16 NOTE — Telephone Encounter (Signed)
 Per DOD Madonna Large, DO and covering Darrell: His VRBO is 360 Cardizem  taken 24 hours apart. If short acting dose is needed then BP should above 120 and subsequent doses no closer than 6 hours apart. Start taking 360 in morning DO NOT TAKE DOSE tonight.   Spoke with Pt. Recommendations given. Pt stated understanding. Advised to keep appointment on Dec 16th. Pt stated understanding.

## 2024-09-16 NOTE — Telephone Encounter (Signed)
 Pt c/o medication issue:  1. Name of Medication:   diltiazem  (CARDIZEM ) 30 MG tablet  diltiazem  (CARDIZEM  CD) 360 MG 24 hr capsule   2. How are you currently taking this medication (dosage and times per day)? Has questions  3. Are you having a reaction (difficulty breathing--STAT)? no  4. What is your medication issue? Wants to discuss how she has been taking it

## 2024-09-19 NOTE — Progress Notes (Unsigned)
 Cardiology Office Note:   Date:  09/20/2024  ID:  Neville Lions, DOB 1934/08/11, MRN [id number] PCP: Kennyth Worth HERO, MD  [facility] Providers Cardiologist:  Lynwood Schilling, MD {  History of Present Illness:   Nancy Mccormick is a 89 y.o. female who presents for followup of atrial fibrillation she was admitted in Nov 2018 with a small MCA infarct and was in atrial fib.  She was in the hospital in August of 2019.  She had atrial fib with RVR which converted spontaneously to NSR.     She was in the emergency room in March 2023 with shortness of breath.    There was no clear cardiac etiology.  She followed up with continued SOB and profound fatigue.  She has had an extensive work up to include echo with only mild AI.  There was a normal BNP.  She had a negative perfusion study and has had lab work with no anemia and a normal TSH.  In the ED CXR was unremarkable.  There was no PE on CT.  She had a head CT and had no acute findings.    Since I last saw her she called recently with atrial fib with palpitations.  She took her Cardizem  CD and short acting drug as well.   She was in the ED with palpitations in August.  She wore a monitor after this and had no atrial fib but had SVT lasting up to 3 minutes.   She says that she gets these palpitations episodically.  She might go a week without them and then might have a couple of them in a week.  It might last for a few minutes at a time although the episode she had above was more significant.  She ended up taking Cardizem  360 x 1 that evening and then a 30 mg tablet that night and then 30 mg tablet an hour later and then her morning dose.  She says that when she gets these episodes she feels fatigued during them and then for a while afterwards.  She does not have any syncope.  She does not report any precipitating factors.  She has some chronic dyspnea that I worked up before with pulmonary function testing, echocardiogram, perfusion imaging  without clear etiology.  This is unchanged.SABRA  He egular and not prolonged.      ROS: As stated in the HPI and negative for all other systems.  Studies Reviewed:    EKG:   EKG Interpretation Date/Time:  Tuesday September 20 2024 15:39:42 EST Ventricular Rate:  78 PR Interval:  198 QRS Duration:  88 QT Interval:  396 QTC Calculation: 451 R Axis:   31  Text Interpretation: Normal sinus rhythm Low voltage QRS When compared with ECG of 27-May-2024 13:31, Premature supraventricular complexes are no longer Present Confirmed by Schilling Lynwood ([id number]) on 09/20/2024 3:48:40 PM     Risk Assessment/Calculations:    CHA2DS2-VASc Score = 7   This indicates a 11.2% annual risk of stroke. The patient's score is based upon: CHF History: 0 HTN History: 1 Diabetes History: 0 Stroke History: 2 Vascular Disease History: 1 Age Score: 2 Gender Score: 1   Physical Exam:   VS:  BP 130/66 (BP Location: Right Arm)   Pulse 78   Ht 5' 4 (1.626 m)   Wt 187 lb (84.8 kg)   SpO2 94%   BMI 32.10 kg/m    Wt Readings from Last 3 Encounters:  09/20/24 187 lb (84.8 kg)  08/05/24 183 lb 6.4 oz (83.2 kg)  07/08/24 191 lb 12.8 oz (87 kg)     GEN: Well nourished, well developed in no acute distress NECK: No JVD; No carotid bruits CARDIAC: RRR, no murmurs, rubs, gallops RESPIRATORY:  Clear to auscultation without rales, wheezing or rhonchi  ABDOMEN: Soft, non-tender, non-distended EXTREMITIES:  No edema; No deformity   ASSESSMENT AND PLAN:   ATRIAL FIBRILLATION:    I have not documented paroxysms of atrial fibrillation with her recent events.  There were none on the monitor she wore earlier this year.  I do not think ablation is the answer.  She tolerates anticoagulation.  I think she is probably having some SVT.  I am going to give her propranolol  10 mg to take 1/2 tablet as needed palpitations.  Bradycardia has resulted from previous attempts at higher doses of beta-blocker.  She is not take her  extra Cardizem  but instead use the beta-blocker to see if that helps with these episodes.   HTN:   The blood pressure is usually at target.  No change in therapies.  DYSLIPIDEMIA:    LDL is 57.  No change in therapy.   PALPITATIONS: This will be managed as above.  She does have some episodes of SVT.    SOB: Walking around the office today she briefly dropped her saturations but this came right back up.  I done an extensive workup.  I think this is unlikely to be an acute coronary syndrome or anginal equivalent.  I am not planning on further workup at this point.     Follow up with APP in 4 months.   Signed, Lynwood Schilling, MD

## 2024-09-20 ENCOUNTER — Ambulatory Visit: Attending: Cardiology | Admitting: Cardiology

## 2024-09-20 ENCOUNTER — Encounter: Payer: Self-pay | Admitting: Cardiology

## 2024-09-20 VITALS — BP 130/66 | HR 78 | Ht 64.0 in | Wt 187.0 lb

## 2024-09-20 DIAGNOSIS — I10 Essential (primary) hypertension: Secondary | ICD-10-CM | POA: Diagnosis not present

## 2024-09-20 DIAGNOSIS — I480 Paroxysmal atrial fibrillation: Secondary | ICD-10-CM | POA: Diagnosis not present

## 2024-09-20 DIAGNOSIS — E785 Hyperlipidemia, unspecified: Secondary | ICD-10-CM

## 2024-09-20 DIAGNOSIS — R002 Palpitations: Secondary | ICD-10-CM

## 2024-09-20 MED ORDER — PROPRANOLOL HCL 10 MG PO TABS: ORAL_TABLET | ORAL | 3 refills | Status: AC

## 2024-09-20 NOTE — Patient Instructions (Signed)
 Medication Instructions:  Start Propranolol  5 mg (1/2 tablet) every 8 hours as needed for palpitations *If you need a refill on your cardiac medications before your next appointment, please call your pharmacy*  Lab Work: NONE If you have labs (blood work) drawn today and your tests are completely normal, you will receive your results only by: MyChart Message (if you have MyChart) OR A paper copy in the mail If you have any lab test that is abnormal or we need to change your treatment, we will call you to review the results.  Testing/Procedures: NONE  Follow-Up: At [facility], you and your health needs are our priority.  As part of our continuing mission to provide you with exceptional heart care, our providers are all part of one team.  This team includes your primary Cardiologist (physician) and Advanced Practice Providers or APPs (Physician Assistants and Nurse Practitioners) who all work together to provide you with the care you need, when you need it.  Your next appointment:   4 month(s)  Provider:   One of our Advanced Practice Providers (APPs): Morse Clause, PA-C  Lamarr Satterfield, NP Miriam Shams, NP  Olivia Pavy, PA-C Josefa Beauvais, NP  Leontine Salen, PA-C Orren Fabry, PA-C  Hao Meng, PA-C Ernest Dick, NP  Damien Braver, NP Jon Hails, PA-C  Waddell Donath, PA-C    Dayna Dunn, PA-C  Scott Weaver, PA-C Lum Louis, NP Katlyn West, NP Callie Goodrich, PA-C  Xika Zhao, NP Sheng Haley, PA-C    Kathleen Johnson, PA-C    We recommend signing up for the patient portal called MyChart.  Sign up information is provided on this After Visit Summary.  MyChart is used to connect with patients for Virtual Visits (Telemedicine).  Patients are able to view lab/test results, encounter notes, upcoming appointments, etc.  Non-urgent messages can be sent to your provider as well.   To learn more about what you can do with MyChart, go to [email or web address].

## 2024-09-20 NOTE — Telephone Encounter (Signed)
 Pt seen in clinic by Dr. Lavona 12/16

## 2024-10-03 ENCOUNTER — Ambulatory Visit
Admission: RE | Admit: 2024-10-03 | Discharge: 2024-10-03 | Disposition: A | Source: Ambulatory Visit | Attending: Family Medicine | Admitting: Family Medicine

## 2024-10-03 ENCOUNTER — Other Ambulatory Visit: Payer: Self-pay | Admitting: Family Medicine

## 2024-10-03 DIAGNOSIS — R928 Other abnormal and inconclusive findings on diagnostic imaging of breast: Secondary | ICD-10-CM

## 2024-10-03 DIAGNOSIS — R921 Mammographic calcification found on diagnostic imaging of breast: Secondary | ICD-10-CM

## 2024-10-05 ENCOUNTER — Ambulatory Visit: Payer: Self-pay

## 2024-10-05 NOTE — Telephone Encounter (Signed)
 FYI Only or Action Required?: Action required by provider: update on patient condition.  Patient was last seen in primary care on 08/05/2024 by Kennyth Worth HERO, MD.  Called Nurse Triage reporting Cough.  Symptoms began yesterday.  Interventions attempted: OTC medications: Coricidin.  Symptoms are: gradually worsening.  Triage Disposition: See HCP Within 4 Hours (Or PCP Triage)  Patient/caregiver understands and will follow disposition?: Yes Copied from CRM #[id number]. Topic: Clinical - Red Word Triage >> Oct 05, 2024  2:42 PM Tysheama G wrote: Red Word that prompted transfer to Nurse Triage: Terrible cough and feeling weak. Reason for Disposition  [1] MILD difficulty breathing (e.g., minimal/no SOB at rest, SOB with walking, pulse < 100) AND [2] still present when not coughing  Answer Assessment - Initial Assessment Questions Pt with slight cough over the last week- was out and about yesterday and last night cough got worse. Clear white mucous. Productive cough. Denies sore throat, sinus congestion, headaches, ear ache, CP. Endorses some SOB at baseline with her heart condition but not worse at this time.  Denies wheezing but breathing sounds wet. Hard to get through a sentence without coughing.  No energy, has not gotten out of bed yet today.  Coricidin for a couple days with minimal relief.  Wanting to get tested for FLU/COVID- advised offices not open at this time. Advised soonest is Friday- advised UC in the interim or ED for worsening. Patient initially wanted to book Friday but coughing fit took over and states she will go to UC.   1. ONSET: When did the cough begin?      Slight cough for a week- worsened overnight  2. SEVERITY: How bad is the cough today?      Hard to get through a conversation without cough  3. SPUTUM: Describe the color of your sputum (e.g., none, dry cough; clear, white, yellow, green)     Clear white 4. HEMOPTYSIS: Are you coughing up any blood? If  Yes, ask: How much? (e.g., flecks, streaks, tablespoons, etc.)     denies 5. DIFFICULTY BREATHING: Are you having difficulty breathing? If Yes, ask: How bad is it? (e.g., mild, moderate, severe)      Denies any changes in breathing-- has baseline some SOB but not worse 6. FEVER: Do you have a fever? If Yes, ask: What is your temperature, how was it measured, and when did it start?     denies 7. CARDIAC HISTORY: Do you have any history of heart disease? (e.g., heart attack, congestive heart failure)      Htn, afib on Dilt  8. LUNG HISTORY: Do you have any history of lung disease?  (e.g., pulmonary embolus, asthma, emphysema)     Denies  9. PE RISK FACTORS: Do you have a history of blood clots? (or: recent major surgery, recent prolonged travel, bedridden)     CVA 8 years ago- Eliquis  10. OTHER SYMPTOMS: Do you have any other symptoms? (e.g., runny nose, wheezing, chest pain)       Denies  Protocols used: Cough - Acute Productive-A-AH

## 2024-10-07 ENCOUNTER — Ambulatory Visit: Admitting: Family Medicine

## 2024-10-07 ENCOUNTER — Telehealth: Payer: Self-pay

## 2024-10-07 NOTE — Telephone Encounter (Signed)
 Yes it is ok if she gets her biopsy.

## 2024-10-07 NOTE — Telephone Encounter (Signed)
 Please advise on patient completing biopsy next week as her cough has subsided. Please and thank you.   Copied from CRM #[id number]. Topic: Clinical - Medical Advice >> Oct 07, 2024  8:07 AM Nancy Mccormick wrote: Reason for CRM: Patient called said she was sick with a cough but is feeling much better now. Requesting to speak with a nurse to see if she is okay to go in for her biopsy next week Monday.

## 2024-10-10 ENCOUNTER — Ambulatory Visit
Admission: RE | Admit: 2024-10-10 | Discharge: 2024-10-10 | Disposition: A | Discharge status: Home / Self Care | Source: Ambulatory Visit | Attending: Family Medicine | Admitting: Family Medicine

## 2024-10-10 DIAGNOSIS — R928 Other abnormal and inconclusive findings on diagnostic imaging of breast: Secondary | ICD-10-CM

## 2024-10-10 DIAGNOSIS — R921 Mammographic calcification found on diagnostic imaging of breast: Secondary | ICD-10-CM

## 2024-10-10 HISTORY — PX: BREAST BIOPSY: SHX20

## 2024-10-11 NOTE — Telephone Encounter (Signed)
 Spoke with patient, stated had biopsy yesterday  Doing good at this time

## 2024-10-12 LAB — SURGICAL PATHOLOGY

## 2024-10-17 ENCOUNTER — Ambulatory Visit: Payer: Self-pay | Admitting: Surgery

## 2024-10-17 ENCOUNTER — Telehealth (HOSPITAL_BASED_OUTPATIENT_CLINIC_OR_DEPARTMENT_OTHER): Payer: Self-pay

## 2024-10-17 DIAGNOSIS — D0511 Intraductal carcinoma in situ of right breast: Secondary | ICD-10-CM

## 2024-10-17 NOTE — Telephone Encounter (Signed)
"  ° °  Pre-operative Risk Assessment    Patient Name: Nancy Mccormick  DOB: 09-Nov-1933 MRN: [id number]   Date of last office visit: 09/20/24 with Hochrein Date of next office visit: 10/24/24 Hochrein   Request for Surgical Clearance    Procedure:  lumpectomy   Date of Surgery:  Clearance TBD                                 Surgeon:  Dr. Vanderbilt Surgeon's Group or Practice Name:  [facility] Phone number:  [phone] Fax number:  [phone]   Type of Clearance Requested:   - Medical  - Pharmacy:  Hold Apixaban  (Eliquis ) 2 days prior    Type of Anesthesia:  General    Additional requests/questions:    [name] [name] [name]   10/17/2024, 10:24 AM   "

## 2024-10-17 NOTE — Telephone Encounter (Signed)
" ° °  Name: Nancy Mccormick  DOB: [birth year]  MRN: [id number]  Primary Cardiologist: Lynwood Schilling, MD  Chart reviewed as part of pre-operative protocol coverage. The patient has an upcoming visit scheduled with Dr. Schilling  on 10/24/2024 at which time clearance can be addressed in case there are any issues that would impact surgical recommendations.  Lumpectomy Is not scheduled until TBD as below. I added preop FYI to appointment note so that provider is aware to address at time of outpatient visit.  Per office protocol the cardiology provider should forward their finalized clearance decision and recommendations regarding antiplatelet therapy to the requesting party below.    Pharmacy recommendations are requested  I will route this message as FYI to requesting party and remove this message from the preop box as separate preop APP input not needed at this time.   Please call with any questions.  Lamarr Satterfield, NP  10/17/2024, 10:46 AM   "

## 2024-10-17 NOTE — Telephone Encounter (Signed)
 Patient with diagnosis of A Fib on Eliquis  for anticoagulation.    Procedure:  lumpectomy  Date of procedure: TBD   CHA2DS2-VASc Score = 7  This indicates a 11.2% annual risk of stroke. The patient's score is based upon: CHF History: 0 HTN History: 1 Diabetes History: 0 Stroke History: 2 Vascular Disease History: 1 Age Score: 2 Gender Score: 1     CrCl 61 ml/min Platelet count 330k  Patient  has not had an Afib/aflutter ablation in the last 3 months, DCCV within the last 4 weeks or a watchman implanted in the last 45 days    Per office protocol, patient can hold Eliquis  for 2 days prior to procedure.    **This guidance is not considered finalized until pre-operative APP has relayed final recommendations.**

## 2024-10-17 NOTE — Telephone Encounter (Signed)
 Pharmacy please advise on holding Eliquis  prior to lumpectomy scheduled for TBD. Has appt with Dr. Lavona on 10/24/2024.Thank you.

## 2024-10-22 NOTE — Progress Notes (Unsigned)
 " Cardiology Office Note:   Date:  10/24/2024  ID:  Nancy Mccormick, DOB 04/30/1934, MRN [id number] PCP: Kennyth Worth HERO, MD  [facility] Providers Cardiologist:  Lynwood Schilling, MD {  History of Present Illness:   Nancy Mccormick is a 89 y.o. female  who presents for followup of atrial fibrillation she was admitted in Nov 2018 with a small MCA infarct and was in atrial fib.  She was in the hospital in August of 2019.  She had atrial fib with RVR which converted spontaneously to NSR.     She was in the emergency room in March 2023 with shortness of breath.    There was no clear cardiac etiology.  She followed up with continued SOB and profound fatigue.  She has had an extensive work up to include echo with only mild AI.  There was a normal BNP.  She had a negative perfusion study and has had lab work with no anemia and a normal TSH.  In the ED CXR was unremarkable.  There was no PE on CT.  She had a head CT and had no acute findings.   I saw her in Dec 2025 when she called recently with atrial fib with palpitations.  She took her Cardizem  CD and short acting drug as well.   She was in the ED with palpitations in August.  She wore a monitor after this and had no atrial fib but had SVT lasting up to 3 minutes.   I did give her propranolol  10 mg to take one half as needed for palpitations.  We had to move slowly because she has been sensitive to medications.  She is only had to take this for months.  She denies any further palpitations for last few weeks.  She has newly diagnosed ductal carcinoma in situ and has been have probable resection of this and radiation.  She has felt pretty well.  Her breathing which was bothering her is not a problem.  She is not having any presyncope or syncope.  She denies any chest pain.  ROS: As stated in the HPI and negative for all other systems.  Studies Reviewed:    EKG:     NA  Risk Assessment/Calculations:    CHA2DS2-VASc Score = 7   This indicates  a 11.2% annual risk of stroke. The patient's score is based upon: CHF History: 0 HTN History: 1 Diabetes History: 0 Stroke History: 2 Vascular Disease History: 1 Age Score: 2 Gender Score: 1     Physical Exam:   VS:  BP 126/73 (BP Location: Left Arm, Patient Position: Sitting)   Pulse 82   Ht 5' 4 (1.626 m)   Wt 186 lb (84.4 kg)   SpO2 92%   BMI 31.93 kg/m    Wt Readings from Last 3 Encounters:  10/24/24 186 lb (84.4 kg)  09/20/24 187 lb (84.8 kg)  08/05/24 183 lb 6.4 oz (83.2 kg)     GEN: Well nourished, well developed in no acute distress NECK: No JVD; No carotid bruits CARDIAC: RRR, no murmurs, rubs, gallops RESPIRATORY:  Clear to auscultation without rales, wheezing or rhonchi  ABDOMEN: Soft, non-tender, non-distended EXTREMITIES:  No edema; No deformity   ASSESSMENT AND PLAN:   ATRIAL FIBRILLATION:    She is not having any further symptomatic paroxysms.  No change in therapy.  She will continue with the as needed beta-blocker.   HTN:   The blood pressure is well-controlled.  No change in therapy.  DYSLIPIDEMIA:    LDL is 57.  No change in therapy.   SOB: She said she is no longer short of breath.  I done a previous extensive workup.  No further testing is indicated.  PREOP: The patient is going to have breast surgery apparently.  She has no ongoing symptoms.  No change in therapy is indicated.  She would be at acceptable risk for the planned procedure.  She has no high risk symptoms or findings.  She has a good functional level.  This is not a high risk surgery.  No further testing is indicated per ACC/AHA guidelines.     Follow up with me in one year  Signed, Lynwood Schilling, MD   "

## 2024-10-24 ENCOUNTER — Encounter: Payer: Self-pay | Admitting: Cardiology

## 2024-10-24 ENCOUNTER — Ambulatory Visit: Admitting: Cardiology

## 2024-10-24 VITALS — BP 126/73 | HR 82 | Ht 64.0 in | Wt 186.0 lb

## 2024-10-24 DIAGNOSIS — R002 Palpitations: Secondary | ICD-10-CM

## 2024-10-24 DIAGNOSIS — I10 Essential (primary) hypertension: Secondary | ICD-10-CM

## 2024-10-24 DIAGNOSIS — I480 Paroxysmal atrial fibrillation: Secondary | ICD-10-CM | POA: Diagnosis not present

## 2024-10-24 DIAGNOSIS — R0602 Shortness of breath: Secondary | ICD-10-CM | POA: Diagnosis not present

## 2024-10-24 DIAGNOSIS — E785 Hyperlipidemia, unspecified: Secondary | ICD-10-CM | POA: Diagnosis not present

## 2024-10-24 NOTE — Patient Instructions (Signed)

## 2024-10-27 ENCOUNTER — Encounter: Payer: Self-pay | Admitting: Radiation Oncology

## 2024-10-27 DIAGNOSIS — D0511 Intraductal carcinoma in situ of right breast: Secondary | ICD-10-CM | POA: Insufficient documentation

## 2024-10-27 NOTE — Progress Notes (Signed)
 Location of Breast Cancer:  Histology per Pathology Report:   Receptor Status: ER(20% positive), PR (0% negative), Her2-neu (), Ki-67()  Did patient present with symptoms (if so, please note symptoms) or was this found on screening mammography?: diagnostic mammogram   Past/Anticipated interventions by surgeon, if any:  Past/Anticipated interventions by medical oncology, if any: Chemotherapy   Lymphedema issues, if any:  Denies  Pain issues, if any:  Denies  SAFETY ISSUES: Prior radiation? No Pacemaker/ICD? No Possible current pregnancy?No Is the patient on methotrexate? No  Current Complaints / other details:      BP (!) 165/75 (BP Location: Left Arm, Patient Position: Sitting, Cuff Size: Large)   Pulse 81   Temp 97.8 F (36.6 C)   Resp 20   Ht 5' 4 (1.626 m)   Wt 186 lb 3.2 oz (84.5 kg)   SpO2 97%   BMI 31.96 kg/m

## 2024-10-27 NOTE — Progress Notes (Signed)
 " Radiation Oncology         [phone] ________________________________  Name: Nancy Mccormick        MRN: [id number]  Date of Service: 10/28/2024 DOB: 1934/09/06  RR:Ejmxzm, Worth HERO, MD  Vanderbilt Ned, MD     REFERRING PHYSICIAN: Vanderbilt Ned, MD   DIAGNOSIS: The encounter diagnosis was Ductal carcinoma in situ (DCIS) of right breast. D05.11    HISTORY OF PRESENT ILLNESS: Nancy Mccormick is a 89 y.o. female seen in consultation for radiation therapy.  She presented with a screen detected R breast calcifications warranting further evaluation in November 2025. Subsequent diagnostic mammogram demonstrated pleomorphic calcifications spanning up to 3 cm about the upper central breast, anterior to middle third. Biopsy and clip placement on 10/10/24 demonstrated DCIS, intermediate to high grade with necrosis present, ER weakly positive at 20% and PR negative at 0%. She has seen Dr. Vanderbilt in general surgery and they are planning for lumpectomy.  Estrogen exposure history: no known use of HRT  Childbearing/breastfeeding: first child born at age 12, 3 total pregnancies and live births  Family history of cancer: no known history   PREVIOUS RADIATION THERAPY: No  AUTOIMMUNE DISEASE: No  MEDICAL DEVICES: No  PREGNANCY: No   PAST MEDICAL HISTORY:  Past Medical History:  Diagnosis Date   Arthritis    back, left shoulder, and fingers   Atrial fibrillation (HCC)    History of chicken pox    Hyperlipidemia    Hypertension    Osteoporosis    Stroke [facility])       PAST SURGICAL HISTORY: Past Surgical History:  Procedure Laterality Date   BREAST BIOPSY Right 10/10/2024   MM RT BREAST BX W LOC DEV 1ST LESION IMAGE BX SPEC STEREO GUIDE 10/10/2024 [facility]   CATARACT EXTRACTION     KNEE ARTHROSCOPY Right 07/04/2015   Procedure: RIGHT KNEE ARTHROSCOPY WITH MEDIAL MENISCAL DEBRIDEMENT AND CHONDROPLASTY;  Surgeon: Dempsey Moan, MD;  Location: [facility];  Service: Orthopedics;   Laterality: Right;   OPEN REDUCTION INTERNAL FIXATION (ORIF) DISTAL RADIAL FRACTURE Right 12/28/2012   Procedure: OPEN REDUCTION INTERNAL FIXATION (ORIF) DISTAL RADIAL FRACTURE;  Surgeon: Maude KANDICE Herald, MD;  Location: [facility];  Service: Orthopedics;  Laterality: Right;   VAGINAL HYSTERECTOMY       FAMILY HISTORY:  Family History  Problem Relation Age of Onset   Stroke Mother 65   Anemia Mother    Heart disease Mother    Hearing loss Mother    Heart attack Mother    Hypertension Mother    Stroke Father 15   Early death Father    Hypertension Father    Stroke Sister 47   Diabetes Sister    Hypertension Sister    Hypertension Son    Prostate cancer Son    Breast cancer Neg Hx      SOCIAL HISTORY:  reports that she quit smoking about 37 years ago. Her smoking use included cigarettes. She has never used smokeless tobacco. She reports that she does not drink alcohol and does not use drugs.   ALLERGIES: Shellfish allergy and Influenza vaccines   MEDICATIONS:  Current Outpatient Medications  Medication Sig Dispense Refill   acetaminophen  (TYLENOL ) 500 MG tablet Take 1,000 mg by mouth every 6 (six) hours as needed for mild pain or moderate pain.     apixaban  (ELIQUIS ) 5 MG TABS tablet Take 1 tablet (5 mg total) by mouth 2 (two) times daily. 180 tablet 1   Calcium   Carbonate-Vitamin D  (CALCIUM  + D PO) Take 1 tablet by mouth at bedtime.     Cholecalciferol 50 MCG (2000 UT) TABS Take by mouth.     diltiazem  (CARDIZEM  CD) 360 MG 24 hr capsule Take 1 capsule (360 mg total) by mouth daily. 90 capsule 3   Multiple Vitamin (MULTIVITAMIN WITH MINERALS) TABS tablet Take 1 tablet by mouth at bedtime.     propranolol  (INDERAL ) 10 MG tablet Take a half a tablet (5 mg) every 8 hours as needed for palpitations 135 tablet 3   Saline (SIMPLY SALINE) 0.9 % AERS Place 2 each into the nose as directed. Use nightly for sinus hygiene long-term.  Can also be used as many times daily as  desired to assist with clearing congested sinuses. 127 mL 11   diltiazem  (CARDIZEM ) 30 MG tablet Take 1 tablet (30 mg total) by mouth daily as needed (heart rate over 120). (Patient not taking: Reported on 10/27/2024) 30 tablet 0   ezetimibe  (ZETIA ) 10 MG tablet TAKE 1 TABLET(10 MG) BY MOUTH DAILY (Patient not taking: Reported on 10/27/2024) 90 tablet 3   No current facility-administered medications for this encounter.     REVIEW OF SYSTEMS: The patient reports that they are doing well generally. Pertinent ROS per HPI and otherwise negative.      PHYSICAL EXAM:  Wt Readings from Last 3 Encounters:  10/28/24 186 lb 3.2 oz (84.5 kg)  10/24/24 186 lb (84.4 kg)  09/20/24 187 lb (84.8 kg)   Temp Readings from Last 3 Encounters:  10/28/24 97.8 F (36.6 C)  08/05/24 98.1 F (36.7 C) (Temporal)  07/08/24 97.8 F (36.6 C) (Oral)   BP Readings from Last 3 Encounters:  10/28/24 (!) 165/75  10/24/24 126/73  09/20/24 130/66   Pulse Readings from Last 3 Encounters:  10/28/24 81  10/24/24 82  09/20/24 78   Pain Assessment Pain Score: 0-No pain/10   Physical Exam Vitals reviewed.  Constitutional:      General: She is not in acute distress. HENT:     Head: Normocephalic and atraumatic.  Eyes:     Extraocular Movements: Extraocular movements intact.  Cardiovascular:     Rate and Rhythm: Normal rate.  Pulmonary:     Effort: Pulmonary effort is normal. No respiratory distress.  Abdominal:     General: There is no distension.  Skin:    Coloration: Skin is not jaundiced or pale.  Neurological:     General: No focal deficit present.     Mental Status: She is alert.     Gait: Gait normal.  Psychiatric:        Mood and Affect: Mood normal.       ECOG = 0   LABORATORY DATA:  Lab Results  Component Value Date   WBC 11.3 (H) 06/24/2024   HGB 13.3 06/24/2024   HCT 40.3 06/24/2024   MCV 90.8 06/24/2024   PLT 330.0 06/24/2024   Lab Results  Component Value Date   NA 143  06/24/2024   K 4.8 06/24/2024   CL 106 06/24/2024   CO2 29 06/24/2024   Lab Results  Component Value Date   ALT 11 06/24/2024   AST 13 06/24/2024   ALKPHOS 100 06/24/2024   BILITOT 0.5 06/24/2024      RADIOGRAPHY: MM RT BREAST BX W LOC DEV 1ST LESION IMAGE BX SPEC STEREO GUIDE Addendum Date: 10/12/2024 ADDENDUM REPORT: 10/12/2024 17:18 ADDENDUM: PATHOLOGY revealed: Breast, right, needle core biopsy, Upper inner quadrant (ribbon clip)- DUCTAL CARCINOMA  IN SITU, INTERMEDIATE TO HIGH GRADE - NECROSIS: PRESENT - CALCIFICATIONS: PRESENT - DCIS LENGTH: 1.0 CM Pathology results are CONCORDANT with imaging findings, per Dr. Aliene Mir. Pathology results and recommendations below were discussed with patient by telephone. Patient reported biopsy site doing well with no adverse symptoms, and only slight tenderness at the site. Post biopsy care instructions were reviewed, questions were answered and my direct phone number was provided. Patient was instructed to call [facility] for any additional questions or concerns related to biopsy site. RECOMMENDATION: Surgical and oncological consultation. Request for surgical consultation relayed to Olam Bunnell and Landry Finger at [facility]. Pathology results reported by Mliss CHARM Molt RN 10/12/2024. Electronically Signed   By: Aliene Lloyd M.D.   On: 10/12/2024 17:18   Result Date: 10/12/2024 CLINICAL DATA:  89 year old woman with suspicious RIGHT breast calcifications presents for stereotactic guided core needle biopsy. EXAM: RIGHT BREAST STEREOTACTIC CORE NEEDLE BIOPSY COMPARISON:  Previous exam(s). FINDINGS: The patient and I discussed the procedure of stereotactic-guided biopsy including benefits and alternatives. We discussed the high likelihood of a successful procedure. We discussed the risks of the procedure including infection, bleeding, tissue injury, clip migration, and inadequate sampling. Informed written consent was  given. The usual time out protocol was performed immediately prior to the procedure. Using sterile technique and 1% Lidocaine  as local anesthetic, under stereotactic guidance, a 9 gauge vacuum assisted device was used to perform core needle biopsy of calcifications in the upper RIGHT breast using a superior approach. Specimen radiograph was performed showing calcifications. Specimens with calcifications are identified for pathology. Lesion quadrant: Upper inner quadrant At the conclusion of the procedure, ribbon shaped tissue marker clip was deployed into the biopsy cavity. Follow-up 2-view mammogram was performed and dictated separately. IMPRESSION: Stereotactic-guided biopsy of RIGHT breast calcifications. No apparent complications. Electronically Signed: By: Aliene Lloyd M.D. On: 10/10/2024 12:13   MM CLIP PLACEMENT RIGHT Result Date: 10/10/2024 CLINICAL DATA:  89 year old woman with suspicious RIGHT breast calcifications EXAM: 3D DIAGNOSTIC RIGHT MAMMOGRAM POST STEREOTACTIC BIOPSY COMPARISON:  Previous exam(s). ACR Breast Density Category b: There are scattered areas of fibroglandular density. FINDINGS: 3D Mammographic images were obtained following stereotactic guided biopsy of RIGHT breast calcifications. The biopsy marking clip is in expected position at the site of biopsy. IMPRESSION: Appropriate positioning of the ribbon shaped biopsy marking clip at the site of biopsy in the upper central RIGHT breast. Final Assessment: Post Procedure Mammograms for Marker Placement Electronically Signed   By: Aliene Lloyd M.D.   On: 10/10/2024 12:14   MM Digital Diagnostic Unilat R Result Date: 10/03/2024 CLINICAL DATA:  89 year old female recalled for right breast calcifications. EXAM: DIGITAL DIAGNOSTIC UNILATERAL RIGHT MAMMOGRAM TECHNIQUE: Right digital diagnostic mammography was performed. Additional 2D spot magnification views were obtained. COMPARISON:  Previous exam(s). ACR Breast Density Category b: There  are scattered areas of fibroglandular density. FINDINGS: Right Mammogram: Spot magnification views demonstrate pleomorphic calcifications spanning up to 3 cm about the upper central breast, anterior to middle third. No suspicious mass or architectural distortion. IMPRESSION: Suspicious right breast calcifications spanning up to 3 cm as above. RECOMMENDATION: Right breast stereotactic guided biopsy. I have discussed the findings and recommendations with the patient. The biopsy procedure was discussed with the patient and questions were answered. Patient expressed their understanding of the biopsy recommendation. Patient will be scheduled for biopsy at her earliest convenience by the schedulers. Ordering provider will be notified. If applicable, a reminder letter will be sent to  the patient regarding the next appointment. BI-RADS CATEGORY  4: Suspicious. Electronically Signed   By: Curtistine Noble   On: 10/03/2024 11:59    Screening Mammogram 08/10/24:  CLINICAL DATA:  Screening.   EXAM: DIGITAL SCREENING BILATERAL MAMMOGRAM WITH TOMOSYNTHESIS AND CAD   TECHNIQUE: Bilateral screening digital craniocaudal and mediolateral oblique mammograms were obtained. Bilateral screening digital breast tomosynthesis was performed. The images were evaluated with computer-aided detection.   COMPARISON:  Previous exam(s).   ACR Breast Density Category b: There are scattered areas of fibroglandular density.   FINDINGS: In the right breast, calcifications warrant further evaluation with magnified views. In the left breast, no findings suspicious for malignancy.   IMPRESSION: Further evaluation is suggested for calcifications in the right breast.   RECOMMENDATION: Diagnostic mammogram of the right breast. (Code:FI-R-35M)   The patient will be contacted regarding the findings, and additional imaging will be scheduled.   BI-RADS CATEGORY  0: Incomplete: Need additional imaging  evaluation.   PATHOLOGY:    Accession #: [id number]  Patient Name: Nancy Mccormick, Nancy Mccormick  Visit # : [id number]   MRN: [id number]  Physician: Katha Callas  DOB/Age [birth year] (Age: 65) Gender: F  Collected Date: 10/10/2024  Received Date: 10/10/2024   FINAL DIAGNOSIS        1. Breast, right, needle core biopsy, Upper inner quadrant (ribbon clip) :       - DUCTAL CARCINOMA IN SITU, INTERMEDIATE TO HIGH GRADE       - NECROSIS: PRESENT       - CALCIFICATIONS: PRESENT       - DCIS LENGTH: 1.0 CM             NOTE:       DR. GRAYLIN REVIEWED THE CASE AND CONCURS WITH THE INTERPRETATION.  A BREAST       PROGNOSTIC PROFILE (ER AND PR) IS PENDING AND WILL BE REPORTED IN AN ADDENDUM.       THE [facility] WAS NOTIFIED ON 10/11/2024.   ADDENDUM  Breast, right, needle core biopsy  For Ductal carcinoma in situ  PROGNOSTIC INDICATORS  Results:  IMMUNOHISTOCHEMICAL AND MORPHOMETRIC ANALYSIS PERFORMED MANUALLY  Estrogen Receptor:  20%, positive, weak staining intensity  Progesterone Receptor:  0%, negative  COMMENT:  The negative hormone receptor study(ies) in this case has an internal positive control.   IMPRESSION/PLAN:   Nancy Mccormick is an 89 yo F w/ a screen detected R breast DCIS, intermediate to high grade with necrosis, ER weakly positive, PR negative who is seen pre-operatively  for consideration of adjuvant radiation therapy. We discussed the role of breast irradiation in reducing the risk of local recurrence and improving long-term disease control. We discussed radiation omission and her individual risk factors for recurrence.  We reviewed the logistics of treatment in detail, including the need for a CT simulation for treatment planning, followed by several days for contouring, dosimetry, and quality assurance prior to starting therapy.   The planned course of radiation therapy will consist of daily treatments, Monday through Friday, over approximately 2-4 weeks.  The decision between a moderately hypofractionated regimen and an ultrahypofractionated regimen will be made based on patient preference after discussion of the risks, benefits, and available data. It was explained that longer-term outcome data are available for moderate hypofractionation; however, based on current evidence, both approaches appear comparable in terms of disease control and toxicity profiles.  We reviewed that each treatment session typically lasts only a few minutes, though positioning and setup  may take additional time. The patient should plan to be in the department for approximately 45 minutes per visit. She will be evaluated by me at least once weekly during treatment to monitor for side effects, assess tolerance, and ensure it is safe to continue therapy.  We discussed acute and subacute side effects which are generally gradual in onset and typically include fatigue, skin erythema, tanning or hyperpigmentation, breast edema or firmness and mild tenderness.  Less common but possible side effects include desquamation of the skin, decreased range of motion of the shoulder, and transient changes in breast size and texture.  Long-term risk such as cosmetic changes, rare risk of rib fracture and cardiopulmonary effects were also reviewed.  Reassured patient that most acute side effects improve over weeks to months after completing therapy.   Patient verbalized understanding of these risks and benefits and she is in agreement with the plan. I will see her in follow-up after surgery. Final treatment planning will depend on her postoperative pathology report. She is leaning towards adjuvant hypofractionated radiation but will think about her options. Do not anticipate the need to radiate her axilla or any nodal regions.   --  Total time spent today in preparation for this visit was 60 minutes. This included patient care, imaging and path review, documentation, multidisciplinary discussion and  coordination of care and follow up.    Estefana HERO. Maritza, M.D.   "

## 2024-10-28 ENCOUNTER — Ambulatory Visit: Payer: Self-pay | Admitting: Surgery

## 2024-10-28 ENCOUNTER — Other Ambulatory Visit: Payer: Self-pay | Admitting: Surgery

## 2024-10-28 ENCOUNTER — Ambulatory Visit
Admission: RE | Admit: 2024-10-28 | Discharge: 2024-10-28 | Disposition: A | Source: Ambulatory Visit | Attending: Radiation Oncology | Admitting: Radiation Oncology

## 2024-10-28 ENCOUNTER — Ambulatory Visit
Admission: RE | Admit: 2024-10-28 | Discharge: 2024-10-28 | Disposition: A | Source: Ambulatory Visit | Attending: Radiation Oncology

## 2024-10-28 VITALS — BP 165/75 | HR 81 | Temp 97.8°F | Resp 20 | Ht 64.0 in | Wt 186.2 lb

## 2024-10-28 DIAGNOSIS — D0511 Intraductal carcinoma in situ of right breast: Secondary | ICD-10-CM | POA: Diagnosis present

## 2024-10-28 DIAGNOSIS — Z8673 Personal history of transient ischemic attack (TIA), and cerebral infarction without residual deficits: Secondary | ICD-10-CM | POA: Diagnosis not present

## 2024-10-28 DIAGNOSIS — M810 Age-related osteoporosis without current pathological fracture: Secondary | ICD-10-CM | POA: Diagnosis not present

## 2024-10-28 DIAGNOSIS — Z170 Estrogen receptor positive status [ER+]: Secondary | ICD-10-CM | POA: Diagnosis not present

## 2024-10-28 DIAGNOSIS — Z809 Family history of malignant neoplasm, unspecified: Secondary | ICD-10-CM | POA: Insufficient documentation

## 2024-10-28 DIAGNOSIS — M129 Arthropathy, unspecified: Secondary | ICD-10-CM | POA: Insufficient documentation

## 2024-10-28 DIAGNOSIS — E785 Hyperlipidemia, unspecified: Secondary | ICD-10-CM | POA: Diagnosis not present

## 2024-10-28 DIAGNOSIS — Z79899 Other long term (current) drug therapy: Secondary | ICD-10-CM | POA: Diagnosis not present

## 2024-10-28 DIAGNOSIS — Z171 Estrogen receptor negative status [ER-]: Secondary | ICD-10-CM | POA: Insufficient documentation

## 2024-10-28 DIAGNOSIS — I10 Essential (primary) hypertension: Secondary | ICD-10-CM | POA: Diagnosis not present

## 2024-10-28 DIAGNOSIS — I4891 Unspecified atrial fibrillation: Secondary | ICD-10-CM | POA: Insufficient documentation

## 2024-10-28 DIAGNOSIS — Z7901 Long term (current) use of anticoagulants: Secondary | ICD-10-CM | POA: Diagnosis not present

## 2024-10-28 DIAGNOSIS — Z87891 Personal history of nicotine dependence: Secondary | ICD-10-CM | POA: Diagnosis not present

## 2024-11-03 ENCOUNTER — Inpatient Hospital Stay: Attending: Hematology and Oncology | Admitting: Hematology and Oncology

## 2024-11-03 ENCOUNTER — Encounter: Payer: Self-pay | Admitting: *Deleted

## 2024-11-03 ENCOUNTER — Inpatient Hospital Stay

## 2024-11-03 VITALS — BP 145/56 | HR 55 | Temp 98.0°F | Resp 22 | Ht 64.0 in | Wt 187.0 lb

## 2024-11-03 DIAGNOSIS — D0511 Intraductal carcinoma in situ of right breast: Secondary | ICD-10-CM

## 2024-11-03 DIAGNOSIS — Z1722 Progesterone receptor negative status: Secondary | ICD-10-CM | POA: Insufficient documentation

## 2024-11-03 DIAGNOSIS — Z8042 Family history of malignant neoplasm of prostate: Secondary | ICD-10-CM | POA: Diagnosis not present

## 2024-11-03 DIAGNOSIS — Z170 Estrogen receptor positive status [ER+]: Secondary | ICD-10-CM | POA: Diagnosis not present

## 2024-11-03 DIAGNOSIS — Z87891 Personal history of nicotine dependence: Secondary | ICD-10-CM | POA: Insufficient documentation

## 2024-11-03 DIAGNOSIS — Z79899 Other long term (current) drug therapy: Secondary | ICD-10-CM | POA: Diagnosis not present

## 2024-11-03 NOTE — Progress Notes (Signed)
 [facility] CONSULT NOTE  Patient Care Team: Kennyth Worth HERO, MD as PCP - General (Family Medicine) Lavona Agent, MD as PCP - Cardiology (Cardiology) Melodi Lerner, MD as Consulting Physician (Orthopedic Surgery) Lavona Agent, MD as Consulting Physician (Cardiology)  CHIEF COMPLAINTS/PURPOSE OF CONSULTATION:  Newly diagnosed breast cancer  HISTORY OF PRESENTING ILLNESS: [name] is a 89 year old who had a screening mammogram that detected right breast calcifications measuring 3 cm.  Biopsy revealed intermediate to high-grade DCIS that was weakly ER +20% PR negative.  She was referred to us  for multidisciplinary care and discussion regarding treatment options.   I reviewed her records extensively and collaborated the history with the patient.  SUMMARY OF ONCOLOGIC HISTORY: Oncology History  Ductal carcinoma in situ (DCIS) of right breast  10/10/2024 Initial Diagnosis   Screening mammogram detected right breast calcifications spanning 3 cm upper central breast, biopsy: Intermediate to high-grade DCIS with necrosis ER 20% positive weak staining, PR 0%   11/03/2024 Cancer Staging   Staging form: Breast, AJCC 8th Edition - Clinical: Stage 0 (cTis (DCIS), cN0, cM0, G3, ER+, PR-, HER2: Not Assessed) - Signed by Odean Potts, MD on 11/03/2024 Stage prefix: Initial diagnosis Histologic grading system: 3 grade system      MEDICAL HISTORY:  Past Medical History:  Diagnosis Date   Arthritis    back, left shoulder, and fingers   Atrial fibrillation (HCC)    History of chicken pox    Hyperlipidemia    Hypertension    Osteoporosis    Stroke [facility])     SURGICAL HISTORY: Past Surgical History:  Procedure Laterality Date   BREAST BIOPSY Right 10/10/2024   MM RT BREAST BX W LOC DEV 1ST LESION IMAGE BX SPEC STEREO GUIDE 10/10/2024 [facility] MAMMOGRAPHY   CATARACT EXTRACTION     KNEE ARTHROSCOPY Right 07/04/2015   Procedure: RIGHT KNEE ARTHROSCOPY WITH MEDIAL MENISCAL  DEBRIDEMENT AND CHONDROPLASTY;  Surgeon: Lerner Melodi, MD;  Location: [facility];  Service: Orthopedics;  Laterality: Right;   OPEN REDUCTION INTERNAL FIXATION (ORIF) DISTAL RADIAL FRACTURE Right 12/28/2012   Procedure: OPEN REDUCTION INTERNAL FIXATION (ORIF) DISTAL RADIAL FRACTURE;  Surgeon: Maude KANDICE Herald, MD;  Location: [facility];  Service: Orthopedics;  Laterality: Right;   VAGINAL HYSTERECTOMY      SOCIAL HISTORY: Social History   Socioeconomic History   Marital status: Widowed    Spouse name: Not on file   Number of children: 3   Years of education: Not on file   Highest education level: Not on file  Occupational History   Occupation: Retired    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 She has three sons and three grandsons.None live locally but she gets to talk with them often and sees them pretty regularly. 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  She enjoys having lunch out with friends regularly. She plans to move to a   Social Drivers of Health   Tobacco Use: Medium Risk (10/24/2024)   Patient History    Smoking Tobacco Use: Former    Smokeless Tobacco Use: Never    Passive Exposure: Not on file  Financial Resource Strain: Low Risk (03/09/2024)   Overall Financial  Resource Strain (CARDIA)    Difficulty of Paying Living Expenses: Not hard at all  Food Insecurity: No Food Insecurity (10/27/2024)   Epic    Worried About Radiation Protection Practitioner of Food in the Last Year: Never true    Ran Out of Food in the Last Year: Never true  Transportation Needs: No Transportation Needs (10/27/2024)   Epic    Lack of Transportation (Medical): No    Lack of Transportation (Non-Medical): No  Physical Activity: Insufficiently Active (03/09/2024)   Exercise Vital Sign    Days of Exercise per Week: 4 days    Minutes of Exercise per Session: 30 min  Stress: No Stress Concern Present (03/09/2024)   [employer] of Occupational Health - Occupational Stress Questionnaire    Feeling of Stress : Not at all  Social Connections: Moderately Integrated (03/09/2024)   Social Connection and Isolation Panel    Frequency of Communication with Friends and Family: More than three times a week    Frequency of Social Gatherings with Friends and Family: More than three times a week    Attends Religious Services: More than 4 times per year    Active Member of [employer] or Organizations: Yes    Attends Banker Meetings: More than 4 times per year    Marital Status: Widowed  Intimate Partner Violence: Not At Risk (10/27/2024)   Epic    Fear of Current or Ex-Partner: No    Emotionally Abused: No    Physically Abused: No    Sexually Abused: No  Depression (PHQ2-9): Low Risk (11/03/2024)   Depression (PHQ2-9)    PHQ-2 Score: 0  Alcohol Screen: Low Risk (03/09/2024)   Alcohol Screen    Last Alcohol Screening Score (AUDIT): 0  Housing: Low Risk (10/27/2024)   Epic    Unable to Pay for Housing in the Last Year: No    Number of Times Moved in the Last Year: 0    Homeless in the Last Year: No  Utilities: Not At Risk (03/09/2024)   AHC Utilities    Threatened with loss of utilities: No  Health Literacy: Adequate Health Literacy (03/09/2024)   B1300 Health Literacy    Frequency of need for help  with medical instructions: Never    FAMILY HISTORY: Family History  Problem Relation Age of Onset   Stroke Mother 63   Anemia Mother    Heart disease Mother    Hearing loss Mother    Heart attack Mother    Hypertension Mother    Stroke Father 63   Early death Father    Hypertension Father    Stroke Sister 76   Diabetes Sister    Hypertension Sister    Hypertension Son    Prostate cancer Son    Breast cancer Neg Hx     ALLERGIES:  is allergic to shellfish allergy and influenza vaccines.  MEDICATIONS:  Current Outpatient Medications  Medication Sig Dispense Refill   acetaminophen  (  TYLENOL ) 500 MG tablet Take 1,000 mg by mouth every 6 (six) hours as needed for mild pain or moderate pain.     apixaban  (ELIQUIS ) 5 MG TABS tablet Take 1 tablet (5 mg total) by mouth 2 (two) times daily. 180 tablet 1   Calcium  Carbonate-Vitamin D  (CALCIUM  + D PO) Take 1 tablet by mouth at bedtime.     Cholecalciferol 50 MCG (2000 UT) TABS Take by mouth.     diltiazem  (CARDIZEM  CD) 360 MG 24 hr capsule Take 1 capsule (360 mg total) by mouth daily. 90 capsule 3   Multiple Vitamin (MULTIVITAMIN WITH MINERALS) TABS tablet Take 1 tablet by mouth at bedtime.     propranolol  (INDERAL ) 10 MG tablet Take a half a tablet (5 mg) every 8 hours as needed for palpitations 135 tablet 3   ezetimibe  (ZETIA ) 10 MG tablet TAKE 1 TABLET(10 MG) BY MOUTH DAILY (Patient not taking: Reported on 11/03/2024) 90 tablet 3   No current facility-administered medications for this visit.    REVIEW OF SYSTEMS:   Constitutional: Denies fevers, chills or abnormal night sweats Breast:  Denies any palpable lumps or discharge All other systems were reviewed with the patient and are negative.  PHYSICAL EXAMINATION: ECOG PERFORMANCE STATUS: 1 - Symptomatic but completely ambulatory  Vitals:   11/03/24 1300 11/03/24 1327  BP: (!) 144/59 (!) 145/56  Pulse: (!) 55   Resp: (!) 22   Temp: 98 F (36.7 C)   SpO2: 95%    Filed  Weights   11/03/24 1300  Weight: 187 lb (84.8 kg)    GENERAL:alert, no distress and comfortable    LABORATORY DATA:  I have reviewed the data as listed Lab Results  Component Value Date   WBC 11.3 (H) 06/24/2024   HGB 13.3 06/24/2024   HCT 40.3 06/24/2024   MCV 90.8 06/24/2024   PLT 330.0 06/24/2024   Lab Results  Component Value Date   NA 143 06/24/2024   K 4.8 06/24/2024   CL 106 06/24/2024   CO2 29 06/24/2024    RADIOGRAPHIC STUDIES: I have personally reviewed the radiological reports and agreed with the findings in the report.  ASSESSMENT AND PLAN:  Ductal carcinoma in situ (DCIS) of right breast 10/10/2024:Screening mammogram detected right breast calcifications spanning 3 cm upper central breast, biopsy: Intermediate to high-grade DCIS with necrosis ER 20% positive weak staining, PR 0%  Pathology review: I discussed with the patient the difference between DCIS and invasive breast cancer. It is considered a precancerous lesion. DCIS is classified as a 0. It is generally detected through mammograms as calcifications. We discussed the significance of grades and its impact on prognosis. We also discussed the importance of ER and PR receptors and their implications to adjuvant treatment options. Prognosis of DCIS dependence on grade, comedo necrosis. It is anticipated that if not treated, 20-30% of DCIS can develop into invasive breast cancer.  Recommendation: 1. Breast conserving surgery 2. Followed by adjuvant radiation therapy 3.  I discussed with her that given the estrogen receptor is only 20%, given her age, I did not recommend any antiestrogen therapy  Return to clinic after radiation is complete    All questions were answered. The patient knows to call the clinic with any problems, questions or concerns. I personally spent a total of 30 minutes in the care of the patient today including preparing to see the patient, getting/reviewing separately obtained history,  performing a medically appropriate exam/evaluation, counseling and educating, placing orders, referring and  communicating with other health care professionals, documenting clinical information in the EHR, independently interpreting results, communicating results, and coordinating care.   Viinay K Atarah Cadogan, MD 11/03/24

## 2024-11-03 NOTE — Progress Notes (Signed)
 Met with patient at initial med onc with Dr. Gudena. She is an independent 89 year old. Her husband of 60+ years passed 7 years ago. Gave her information on breast cancer and Journey notebook. She has navigator's contact information. Referral to Rad Onc placed for post op. Surgery on 2/11. Patient to see Dr. Gudena again after Rad Onc complete.

## 2024-11-03 NOTE — Assessment & Plan Note (Addendum)
 10/10/2024:Screening mammogram detected right breast calcifications spanning 3 cm upper central breast, biopsy: Intermediate to high-grade DCIS with necrosis ER 20% positive weak staining, PR 0%  Pathology review: I discussed with the patient the difference between DCIS and invasive breast cancer. It is considered a precancerous lesion. DCIS is classified as a 0. It is generally detected through mammograms as calcifications. We discussed the significance of grades and its impact on prognosis. We also discussed the importance of ER and PR receptors and their implications to adjuvant treatment options. Prognosis of DCIS dependence on grade, comedo necrosis. It is anticipated that if not treated, 20-30% of DCIS can develop into invasive breast cancer.  Recommendation: 1. Breast conserving surgery 2. Followed by adjuvant radiation therapy 3.  I discussed with her that given the estrogen receptor is only 20%, given her age, I did not recommend any antiestrogen therapy  Return to clinic after surgery to discuss the final pathology report and come up with an adjuvant treatment plan.

## 2024-11-10 ENCOUNTER — Encounter (HOSPITAL_COMMUNITY): Payer: Self-pay

## 2024-11-10 NOTE — Pre-Procedure Instructions (Signed)
 Surgical Instructions   Your procedure is scheduled on November 16, 2024. Report to [facility] Main Entrance A at 6:30 A.M., then check in with the Admitting office. Any questions or running late day of surgery: call [phone]  Questions prior to your surgery date: call [phone], Monday-Friday, 8am-4pm. If you experience any cold or flu symptoms such as cough, fever, chills, shortness of breath, etc. between now and your scheduled surgery, please notify us  at the above number.     Remember:  Do not eat after midnight the night before your surgery   You may drink clear liquids until 5:30 AM the morning of your surgery.   Clear liquids allowed are: Water, Non-Citrus Juices (without pulp), Carbonated Beverages, Clear Tea (no milk, honey, etc.), Black Coffee Only (NO MILK, CREAM OR POWDERED CREAMER of any kind), and Gatorade.    Take these medicines the morning of surgery with A SIP OF WATER: diltiazem  (CARDIZEM  CD)    May take these medicines IF NEEDED: acetaminophen  (TYLENOL )  Polyethyl Glycol-Propyl Glycol (SYSTANE OP) eye drops propranolol  (INDERAL )    Follow your surgeon's instructions on when to stop apixaban  (ELIQUIS ).  If no instructions were given by your surgeon then you will need to call the office to get those instructions.     One week prior to surgery, STOP taking any Aspirin  (unless otherwise instructed by your surgeon) Aleve , Naproxen , Ibuprofen, Motrin, Advil, Goody's, BC's, all herbal medications, fish oil, and non-prescription vitamins.                     Do NOT Smoke (Tobacco/Vaping) for 24 hours prior to your procedure.  If you use a CPAP at night, you may bring your mask/headgear for your overnight stay.   You will be asked to remove any contacts, glasses, piercing's, hearing aid's, dentures/partials prior to surgery. Please bring cases for these items if needed.    Your surgeon will determine if you are to be admitted or discharged the same day.   Patients discharged the day of surgery will not be allowed to drive home, and someone needs to stay with them for 24 hours.  SURGICAL WAITING ROOM VISITATION Patients may have no more than 2 support people in the waiting area - these visitors may rotate.   Pre-op nurse will coordinate an appropriate time for 2 ADULT support persons, who may not rotate, to accompany patient in pre-op.  Children under the age of 76 must have an adult with them who is not the patient and must remain in the main waiting area with an adult.  If the patient needs to stay at the hospital during part of their recovery, the visitor guidelines for inpatient rooms apply.  Please refer to the [facility] website for the visitor guidelines for any additional information.   If you received a COVID test during your pre-op visit  it is requested that you wear a mask when out in public, stay away from anyone that may not be feeling well and notify your surgeon if you develop symptoms. If you have been in contact with anyone that has tested positive in the last 10 days please notify you surgeon.      Pre-operative CHG Bathing Instructions   You can play a key role in reducing the risk of infection after surgery. Your skin needs to be as free of germs as possible. You can reduce the number of germs on your skin by washing with CHG (chlorhexidine  gluconate) soap before surgery.  CHG is an antiseptic soap that kills germs and continues to kill germs even after washing.   DO NOT use if you have an allergy to chlorhexidine /CHG or antibacterial soaps. If your skin becomes reddened or irritated, stop using the CHG and notify one of our RNs at [phone].              TAKE A SHOWER THE NIGHT BEFORE SURGERY   Please keep in mind the following:  DO NOT shave, including legs and underarms, 48 hours prior to surgery.   You may shave your face before/day of surgery.  Place clean sheets on your bed the night before surgery Use a clean  washcloth (not used since being washed) for shower. DO NOT sleep with pet's night before surgery.  CHG Shower Instructions:  Wash your face and private area with normal soap. If you choose to wash your hair, wash first with your normal shampoo.  After you use shampoo/soap, rinse your hair and body thoroughly to remove shampoo/soap residue.  Turn the water OFF and apply half the bottle of CHG soap to a CLEAN washcloth.  Apply CHG soap ONLY FROM YOUR NECK DOWN TO YOUR TOES (washing for 3-5 minutes)  DO NOT use CHG soap on face, private areas, open wounds, or sores.  Pay special attention to the area where your surgery is being performed.  If you are having back surgery, having someone wash your back for you may be helpful. Wait 2 minutes after CHG soap is applied, then you may rinse off the CHG soap.  Pat dry with a clean towel  Put on clean pajamas    Additional instructions for the day of surgery: If you choose, you may shower the morning of surgery with an antibacterial soap.  DO NOT APPLY any lotions, deodorants, cologne, or perfumes.   Do not wear jewelry or makeup Do not wear nail polish, gel polish, artificial nails, or any other type of covering on natural nails (fingers and toes) Do not bring valuables to the hospital. [facility] is not responsible for valuables/personal belongings. Put on clean/comfortable clothes.  Please brush your teeth.  Ask your nurse before applying any prescription medications to the skin.

## 2024-11-11 ENCOUNTER — Other Ambulatory Visit: Payer: Self-pay

## 2024-11-11 ENCOUNTER — Inpatient Hospital Stay (HOSPITAL_COMMUNITY): Admission: RE | Admit: 2024-11-11

## 2024-11-11 ENCOUNTER — Encounter (HOSPITAL_COMMUNITY): Payer: Self-pay

## 2024-11-11 VITALS — BP 161/72 | HR 58 | Temp 97.7°F | Resp 18 | Ht 64.0 in | Wt 186.0 lb

## 2024-11-11 DIAGNOSIS — I2510 Atherosclerotic heart disease of native coronary artery without angina pectoris: Secondary | ICD-10-CM

## 2024-11-11 HISTORY — DX: Cardiac arrhythmia, unspecified: I49.9

## 2024-11-11 HISTORY — DX: Prediabetes: R73.03

## 2024-11-11 HISTORY — DX: Malignant (primary) neoplasm, unspecified: C80.1

## 2024-11-11 LAB — CBC
HCT: 41.2 % (ref 36.0–46.0)
Hemoglobin: 13.8 g/dL (ref 12.0–15.0)
MCH: 30.7 pg (ref 26.0–34.0)
MCHC: 33.5 g/dL (ref 30.0–36.0)
MCV: 91.6 fL (ref 80.0–100.0)
Platelets: 347 10*3/uL (ref 150–400)
RBC: 4.5 MIL/uL (ref 3.87–5.11)
RDW: 13.7 % (ref 11.5–15.5)
WBC: 10.5 10*3/uL (ref 4.0–10.5)
nRBC: 0 % (ref 0.0–0.2)

## 2024-11-11 LAB — BASIC METABOLIC PANEL WITH GFR
Anion gap: 13 (ref 5–15)
BUN: 13 mg/dL (ref 8–23)
CO2: 24 mmol/L (ref 22–32)
Calcium: 8.8 mg/dL — ABNORMAL LOW (ref 8.9–10.3)
Chloride: 104 mmol/L (ref 98–111)
Creatinine, Ser: 0.66 mg/dL (ref 0.44–1.00)
GFR, Estimated: >60 mL/min
Glucose, Bld: 109 mg/dL — ABNORMAL HIGH (ref 70–99)
Potassium: 3.5 mmol/L (ref 3.5–5.1)
Sodium: 141 mmol/L (ref 135–145)

## 2024-11-11 NOTE — Progress Notes (Signed)
 PCP - Dr. Worth Kitty Cardiologist - Dr. Lynwood Schilling - last office visit 10/24/2024  PPM/ICD - Denies Device Orders - n/a Rep Notified - n/a  Chest x-ray - n/a EKG - 09/20/2024 Stress Test - 12/27/2021 ECHO - 12/20/2021 Cardiac Cath - Denies  Sleep Study - Denies CPAP - n/a  Pt is Pre- DM  Last dose of GLP1 agonist- n/a GLP1 instructions: n/a  Blood Thinner Instructions: Pt instructed to hold Eliquis  2 days prior to surgery. Her last dose will be Sunday, February 8th. Aspirin  Instructions: n/a  ERAS Protcol - Clear liquids until 0530 morning of surgery PRE-SURGERY Ensure or G2- n/a  COVID TEST- n/a   Anesthesia review: Yes. Breast seed appointment 2/10 at 1130. Cardiac clearance.   Patient denies shortness of breath, fever, cough and chest pain at PAT appointment. Pt endorses a cold on 12/31 that was treated with OTC medications. Only symptoms were a productive cough with clear/white phlegm. Pt states she has been symptom free for over three weeks.    All instructions explained to the patient, with a verbal understanding of the material. Patient agrees to go over the instructions while at home for a better understanding. Patient also instructed to self quarantine after being tested for COVID-19. The opportunity to ask questions was provided.

## 2024-11-15 ENCOUNTER — Encounter

## 2024-11-16 ENCOUNTER — Ambulatory Visit (HOSPITAL_COMMUNITY): Admit: 2024-11-16 | Admitting: Surgery

## 2024-11-16 ENCOUNTER — Encounter

## 2024-12-12 ENCOUNTER — Ambulatory Visit: Admitting: Radiation Oncology

## 2024-12-12 ENCOUNTER — Ambulatory Visit

## 2024-12-14 ENCOUNTER — Ambulatory Visit: Admitting: Radiation Oncology

## 2025-01-19 ENCOUNTER — Ambulatory Visit: Admitting: Cardiology

## 2025-02-01 ENCOUNTER — Ambulatory Visit: Admitting: Family Medicine

## 2025-03-13 ENCOUNTER — Ambulatory Visit

## 2025-07-03 ENCOUNTER — Encounter: Admitting: Family Medicine
